# Patient Record
Sex: Female | Born: 1980
Health system: Southern US, Community
[De-identification: ages and names within clinical notes are randomized; demographics above are authoritative.]

## PROBLEM LIST (undated history)

## (undated) DIAGNOSIS — Z800 Family history of malignant neoplasm of digestive organs: Secondary | ICD-10-CM

## (undated) DIAGNOSIS — T7840XA Allergy, unspecified, initial encounter: Secondary | ICD-10-CM

## (undated) DIAGNOSIS — E785 Hyperlipidemia, unspecified: Secondary | ICD-10-CM

## (undated) DIAGNOSIS — K625 Hemorrhage of anus and rectum: Secondary | ICD-10-CM

## (undated) DIAGNOSIS — K219 Gastro-esophageal reflux disease without esophagitis: Secondary | ICD-10-CM

## (undated) DIAGNOSIS — Z8051 Family history of malignant neoplasm of kidney: Secondary | ICD-10-CM

## (undated) DIAGNOSIS — Z973 Presence of spectacles and contact lenses: Secondary | ICD-10-CM

## (undated) DIAGNOSIS — J189 Pneumonia, unspecified organism: Secondary | ICD-10-CM

## (undated) DIAGNOSIS — I10 Essential (primary) hypertension: Secondary | ICD-10-CM

## (undated) DIAGNOSIS — F410 Panic disorder [episodic paroxysmal anxiety] without agoraphobia: Secondary | ICD-10-CM

## (undated) DIAGNOSIS — E119 Type 2 diabetes mellitus without complications: Secondary | ICD-10-CM

## (undated) DIAGNOSIS — E669 Obesity, unspecified: Secondary | ICD-10-CM

## (undated) DIAGNOSIS — G43909 Migraine, unspecified, not intractable, without status migrainosus: Secondary | ICD-10-CM

## (undated) DIAGNOSIS — Z8489 Family history of other specified conditions: Secondary | ICD-10-CM

## (undated) DIAGNOSIS — G4730 Sleep apnea, unspecified: Secondary | ICD-10-CM

## (undated) DIAGNOSIS — Z803 Family history of malignant neoplasm of breast: Secondary | ICD-10-CM

## (undated) DIAGNOSIS — F32A Depression, unspecified: Secondary | ICD-10-CM

## (undated) DIAGNOSIS — Z8042 Family history of malignant neoplasm of prostate: Secondary | ICD-10-CM

## (undated) HISTORY — DX: Panic disorder (episodic paroxysmal anxiety): F41.0

## (undated) HISTORY — DX: Family history of malignant neoplasm of breast: Z80.3

## (undated) HISTORY — DX: Gastro-esophageal reflux disease without esophagitis: K21.9

## (undated) HISTORY — PX: OTHER SURGICAL HISTORY: SHX169

## (undated) HISTORY — DX: Family history of malignant neoplasm of kidney: Z80.51

## (undated) HISTORY — DX: Family history of malignant neoplasm of digestive organs: Z80.0

## (undated) HISTORY — DX: Presence of spectacles and contact lenses: Z97.3

## (undated) HISTORY — DX: Obesity, unspecified: E66.9

## (undated) HISTORY — PX: COLONOSCOPY: SHX174

## (undated) HISTORY — DX: Hyperlipidemia, unspecified: E78.5

## (undated) HISTORY — DX: Family history of malignant neoplasm of prostate: Z80.42

## (undated) HISTORY — DX: Depression, unspecified: F32.A

## (undated) HISTORY — DX: Type 2 diabetes mellitus without complications: E11.9

## (undated) HISTORY — DX: Migraine, unspecified, not intractable, without status migrainosus: G43.909

## (undated) HISTORY — DX: Hemorrhage of anus and rectum: K62.5

## (undated) HISTORY — DX: Allergy, unspecified, initial encounter: T78.40XA

## (undated) HISTORY — PX: FRACTURE SURGERY: SHX138

---

## 2003-07-15 HISTORY — PX: OTHER SURGICAL HISTORY: SHX169

## 2003-09-27 ENCOUNTER — Emergency Department (HOSPITAL_COMMUNITY): Admission: AD | Admit: 2003-09-27 | Discharge: 2003-09-28 | Payer: Self-pay | Admitting: Emergency Medicine

## 2004-05-27 ENCOUNTER — Emergency Department (HOSPITAL_COMMUNITY): Admission: EM | Admit: 2004-05-27 | Discharge: 2004-05-27 | Payer: Self-pay | Admitting: Emergency Medicine

## 2005-10-22 ENCOUNTER — Emergency Department (HOSPITAL_COMMUNITY): Admission: EM | Admit: 2005-10-22 | Discharge: 2005-10-23 | Payer: Self-pay | Admitting: Emergency Medicine

## 2006-03-07 ENCOUNTER — Emergency Department (HOSPITAL_COMMUNITY): Admission: EM | Admit: 2006-03-07 | Discharge: 2006-03-07 | Payer: Self-pay | Admitting: *Deleted

## 2007-03-08 ENCOUNTER — Emergency Department (HOSPITAL_COMMUNITY): Admission: EM | Admit: 2007-03-08 | Discharge: 2007-03-09 | Payer: Self-pay | Admitting: Emergency Medicine

## 2007-08-06 ENCOUNTER — Ambulatory Visit (HOSPITAL_COMMUNITY): Admission: RE | Admit: 2007-08-06 | Discharge: 2007-08-06 | Payer: Self-pay | Admitting: Chiropractic Medicine

## 2008-06-10 ENCOUNTER — Emergency Department (HOSPITAL_COMMUNITY): Admission: EM | Admit: 2008-06-10 | Discharge: 2008-06-10 | Payer: Self-pay | Admitting: Emergency Medicine

## 2011-01-06 ENCOUNTER — Other Ambulatory Visit: Payer: Self-pay | Admitting: Family Medicine

## 2011-01-06 ENCOUNTER — Other Ambulatory Visit (HOSPITAL_COMMUNITY)
Admission: RE | Admit: 2011-01-06 | Discharge: 2011-01-06 | Disposition: A | Discharge status: Home / Self Care | Payer: BC Managed Care – PPO | Source: Ambulatory Visit | Attending: Family Medicine | Admitting: Family Medicine

## 2011-01-06 DIAGNOSIS — Z113 Encounter for screening for infections with a predominantly sexual mode of transmission: Secondary | ICD-10-CM | POA: Insufficient documentation

## 2011-01-06 DIAGNOSIS — Z124 Encounter for screening for malignant neoplasm of cervix: Secondary | ICD-10-CM | POA: Insufficient documentation

## 2013-09-11 ENCOUNTER — Emergency Department (HOSPITAL_COMMUNITY)
Admission: EM | Admit: 2013-09-11 | Discharge: 2013-09-11 | Disposition: A | Discharge status: Home / Self Care | Payer: BC Managed Care – PPO | Attending: Emergency Medicine | Admitting: Emergency Medicine

## 2013-09-11 ENCOUNTER — Encounter (HOSPITAL_COMMUNITY): Payer: Self-pay | Admitting: Emergency Medicine

## 2013-09-11 DIAGNOSIS — R1110 Vomiting, unspecified: Secondary | ICD-10-CM

## 2013-09-11 DIAGNOSIS — Z79899 Other long term (current) drug therapy: Secondary | ICD-10-CM | POA: Insufficient documentation

## 2013-09-11 DIAGNOSIS — R51 Headache: Secondary | ICD-10-CM | POA: Insufficient documentation

## 2013-09-11 DIAGNOSIS — I10 Essential (primary) hypertension: Secondary | ICD-10-CM | POA: Insufficient documentation

## 2013-09-11 DIAGNOSIS — R112 Nausea with vomiting, unspecified: Secondary | ICD-10-CM | POA: Insufficient documentation

## 2013-09-11 DIAGNOSIS — R519 Headache, unspecified: Secondary | ICD-10-CM

## 2013-09-11 HISTORY — DX: Essential (primary) hypertension: I10

## 2013-09-11 MED ORDER — ONDANSETRON HCL 4 MG/2ML IJ SOLN
4.0000 mg | Freq: Once | INTRAMUSCULAR | Status: AC
  Administered 2013-09-11: 4 mg via INTRAVENOUS
  Filled 2013-09-11: qty 2

## 2013-09-11 MED ORDER — TRAMADOL HCL 50 MG PO TABS: 50.0000 mg | ORAL_TABLET | Freq: Four times a day (QID) | ORAL | Status: DC | PRN

## 2013-09-11 MED ORDER — SODIUM CHLORIDE 0.9 % IV BOLUS (SEPSIS)
1000.0000 mL | Freq: Once | INTRAVENOUS | Status: AC
  Administered 2013-09-11: 1000 mL via INTRAVENOUS

## 2013-09-11 MED ORDER — ONDANSETRON 4 MG PO TBDP: 4.0000 mg | ORAL_TABLET | Freq: Three times a day (TID) | ORAL | Status: DC | PRN

## 2013-09-11 MED ORDER — MORPHINE SULFATE 4 MG/ML IJ SOLN
4.0000 mg | INTRAMUSCULAR | Status: DC | PRN
  Administered 2013-09-11: 4 mg via INTRAVENOUS
  Filled 2013-09-11: qty 1

## 2013-09-11 NOTE — ED Notes (Signed)
Pt states she had a virus yesterday (n/v/d), vomited her BP meds and then today she had headache and checked her BP and was high.

## 2013-09-11 NOTE — Discharge Instructions (Signed)
Migraine Headache A migraine headache is an intense, throbbing pain on one or both sides of your head. A migraine can last for 30 minutes to several hours. CAUSES  The exact cause of a migraine headache is not always known. However, a migraine may be caused when nerves in the brain become irritated and release chemicals that cause inflammation. This causes pain. Certain things may also trigger migraines, such as:  Alcohol.  Smoking.  Stress.  Menstruation.  Aged cheeses.  Foods or drinks that contain nitrates, glutamate, aspartame, or tyramine.  Lack of sleep.  Chocolate.  Caffeine.  Hunger.  Physical exertion.  Fatigue.  Medicines used to treat chest pain (nitroglycerine), birth control pills, estrogen, and some blood pressure medicines. SIGNS AND SYMPTOMS  Pain on one or both sides of your head.  Pulsating or throbbing pain.  Severe pain that prevents daily activities.  Pain that is aggravated by any physical activity.  Nausea, vomiting, or both.  Dizziness.  Pain with exposure to bright lights, loud noises, or activity.  General sensitivity to bright lights, loud noises, or smells. Before you get a migraine, you may get warning signs that a migraine is coming (aura). An aura may include:  Seeing flashing lights.  Seeing bright spots, halos, or zig-zag lines.  Having tunnel vision or blurred vision.  Having feelings of numbness or tingling.  Having trouble talking.  Having muscle weakness. DIAGNOSIS  A migraine headache is often diagnosed based on:  Symptoms.  Physical exam.  A CT scan or MRI of your head. These imaging tests cannot diagnose migraines, but they can help rule out other causes of headaches. TREATMENT Medicines may be given for pain and nausea. Medicines can also be given to help prevent recurrent migraines.  HOME CARE INSTRUCTIONS  Only take over-the-counter or prescription medicines for pain or discomfort as directed by your  health care provider. The use of long-term narcotics is not recommended.  Lie down in a dark, quiet room when you have a migraine.  Keep a journal to find out what may trigger your migraine headaches. For example, write down:  What you eat and drink.  How much sleep you get.  Any change to your diet or medicines.  Limit alcohol consumption.  Quit smoking if you smoke.  Get 7 9 hours of sleep, or as recommended by your health care provider.  Limit stress.  Keep lights dim if bright lights bother you and make your migraines worse. SEEK IMMEDIATE MEDICAL CARE IF:   Your migraine becomes severe.  You have a fever.  You have a stiff neck.  You have vision loss.  You have muscular weakness or loss of muscle control.  You start losing your balance or have trouble walking.  You feel faint or pass out.  You have severe symptoms that are different from your first symptoms. MAKE SURE YOU:   Understand these instructions.  Will watch your condition.  Will get help right away if you are not doing well or get worse. Document Released: 06/30/2005 Document Revised: 04/20/2013 Document Reviewed: 03/07/2013 [facility] Patient Information 2014 [place].  Nausea and Vomiting Nausea means you feel sick to your stomach. Throwing up (vomiting) is a reflex where stomach contents come out of your mouth. HOME CARE   Take medicine as told by your doctor.  Do not force yourself to eat. However, you do need to drink fluids.  If you feel like eating, eat a normal diet as told by your doctor.  Eat  rice, wheat, potatoes, bread, lean meats, yogurt, fruits, and vegetables.  Avoid high-fat foods.  Drink enough fluids to keep your pee (urine) clear or pale yellow.  Ask your doctor how to replace body fluid losses (rehydrate). Signs of body fluid loss (dehydration) include:  Feeling very thirsty.  Dry lips and mouth.  Feeling dizzy.  Dark pee.  Peeing less than  normal.  Feeling confused.  Fast breathing or heart rate. GET HELP RIGHT AWAY IF:   You have blood in your throw up.  You have black or bloody poop (stool).  You have a bad headache or stiff neck.  You feel confused.  You have bad belly (abdominal) pain.  You have chest pain or trouble breathing.  You do not pee at least once every 8 hours.  You have cold, clammy skin.  You keep throwing up after 24 to 48 hours.  You have a fever. MAKE SURE YOU:   Understand these instructions.  Will watch your condition.  Will get help right away if you are not doing well or get worse. Document Released: 12/17/2007 Document Revised: 09/22/2011 Document Reviewed: 11/29/2010 [facility] [place] Patient Information 2014 [place], [place].

## 2013-09-11 NOTE — ED Provider Notes (Signed)
CSN: [id number]     Arrival date & time 09/11/13  1742 History   First MD Initiated Contact with Patient 09/11/13 1940     Chief Complaint  Patient presents with  . Hypertension  . Headache     HPI  Patient's history migraine headaches. To migraine yesterday. Nausea and vomiting today. Was unable to keep down her blood pressure medication. Became concerned because her blood pressure was one sixty over one 3. No vomiting status of nausea. Celexa 6/10 headache. No neck stiffness. No fever. No thunderclap event no neurological symptoms. No confusion no rash.  Past Medical History  Diagnosis Date  . Hypertension    Past Surgical History  Procedure Laterality Date  . Fracture surgery     History reviewed. No pertinent family history. History  Substance Use Topics  . Smoking status: Never Smoker   . Smokeless tobacco: Never Used  . Alcohol Use: No   OB History   Grav Para Term Preterm Abortions TAB SAB Ect Mult Living                 Review of Systems  Constitutional: Negative for fever, chills, diaphoresis, appetite change and fatigue.  HENT: Negative for mouth sores, sore throat and trouble swallowing.   Eyes: Negative for visual disturbance.  Respiratory: Negative for cough, chest tightness, shortness of breath and wheezing.   Cardiovascular: Negative for chest pain.  Gastrointestinal: Positive for nausea and vomiting. Negative for abdominal pain, diarrhea and abdominal distention.  Endocrine: Negative for polydipsia, polyphagia and polyuria.  Genitourinary: Negative for dysuria, frequency and hematuria.  Musculoskeletal: Negative for gait problem.  Skin: Negative for color change, pallor and rash.  Neurological: Positive for headaches. Negative for dizziness, syncope and light-headedness.  Hematological: Does not bruise/bleed easily.  Psychiatric/Behavioral: Negative for behavioral problems and confusion.      Allergies  Review of patient's allergies indicates no  known allergies.  Home Medications   Current Outpatient Rx  Name  Route  Sig  Dispense  Refill  . lisinopril-hydrochlorothiazide (PRINZIDE,ZESTORETIC) 10-12.5 MG per tablet   Oral   Take 1 tablet by mouth daily.         Marland Kitchen loratadine (CLARITIN) 10 MG tablet   Oral   Take 10 mg by mouth daily.         . Multiple Vitamin (MULTIVITAMIN WITH MINERALS) TABS tablet   Oral   Take 1 tablet by mouth daily.         Marland Kitchen omeprazole (PRILOSEC) 20 MG capsule   Oral   Take 20 mg by mouth daily.         . ondansetron (ZOFRAN ODT) 4 MG disintegrating tablet   Oral   Take 1 tablet (4 mg total) by mouth every 8 (eight) hours as needed for nausea.   10 tablet   0   . traMADol (ULTRAM) 50 MG tablet   Oral   Take 1 tablet (50 mg total) by mouth every 6 (six) hours as needed.   15 tablet   0    BP 154/89  Pulse 69  Temp(Src) 98 F (36.7 C) (Oral)  Resp 18  SpO2 99%  LMP 09/10/2013 Physical Exam  Constitutional: She is oriented to person, place, and time. She appears well-developed and well-nourished. No distress.  HENT:  Head: Normocephalic.  Eyes: Conjunctivae are normal. Pupils are equal, round, and reactive to light. No scleral icterus.  Neck: Normal range of motion. Neck supple. No thyromegaly present.  Cardiovascular: Normal rate and  regular rhythm.  Exam reveals no gallop and no friction rub.   No murmur heard. Pulmonary/Chest: Effort normal and breath sounds normal. No respiratory distress. She has no wheezes. She has no rales.  Abdominal: Soft. Bowel sounds are normal. She exhibits no distension. There is no tenderness. There is no rebound.  Musculoskeletal: Normal range of motion.  Neurological: She is alert and oriented to person, place, and time.  Skin: Skin is warm and dry. No rash noted.  Psychiatric: She has a normal mood and affect. Her behavior is normal.    ED Course  Procedures (including critical care time) Labs Review Labs Reviewed - No data to  display Imaging Review No results found.   EKG Interpretation None      MDM   Final diagnoses:  Vomiting  Headache    Headache resolved. Taking by mouth liquids. Plan is discharge home.    Tanna Furry, MD 09/11/13 2242

## 2013-11-09 ENCOUNTER — Encounter (HOSPITAL_COMMUNITY): Payer: Self-pay | Admitting: Emergency Medicine

## 2013-11-09 ENCOUNTER — Emergency Department (HOSPITAL_COMMUNITY)
Admission: EM | Admit: 2013-11-09 | Discharge: 2013-11-09 | Disposition: A | Discharge status: Home / Self Care | Payer: BC Managed Care – PPO | Attending: Emergency Medicine | Admitting: Emergency Medicine

## 2013-11-09 DIAGNOSIS — J3489 Other specified disorders of nose and nasal sinuses: Secondary | ICD-10-CM | POA: Insufficient documentation

## 2013-11-09 DIAGNOSIS — R059 Cough, unspecified: Secondary | ICD-10-CM | POA: Insufficient documentation

## 2013-11-09 DIAGNOSIS — R0602 Shortness of breath: Secondary | ICD-10-CM | POA: Insufficient documentation

## 2013-11-09 DIAGNOSIS — Z9114 Patient's other noncompliance with medication regimen: Secondary | ICD-10-CM

## 2013-11-09 DIAGNOSIS — R0981 Nasal congestion: Secondary | ICD-10-CM

## 2013-11-09 DIAGNOSIS — K1370 Unspecified lesions of oral mucosa: Secondary | ICD-10-CM | POA: Insufficient documentation

## 2013-11-09 DIAGNOSIS — R0982 Postnasal drip: Secondary | ICD-10-CM | POA: Insufficient documentation

## 2013-11-09 DIAGNOSIS — Z79899 Other long term (current) drug therapy: Secondary | ICD-10-CM | POA: Insufficient documentation

## 2013-11-09 DIAGNOSIS — R05 Cough: Secondary | ICD-10-CM | POA: Insufficient documentation

## 2013-11-09 DIAGNOSIS — Z91199 Patient's noncompliance with other medical treatment and regimen due to unspecified reason: Secondary | ICD-10-CM | POA: Insufficient documentation

## 2013-11-09 DIAGNOSIS — Z9119 Patient's noncompliance with other medical treatment and regimen: Secondary | ICD-10-CM | POA: Insufficient documentation

## 2013-11-09 DIAGNOSIS — I10 Essential (primary) hypertension: Secondary | ICD-10-CM | POA: Insufficient documentation

## 2013-11-09 DIAGNOSIS — R000 Tachycardia, unspecified: Secondary | ICD-10-CM | POA: Insufficient documentation

## 2013-11-09 MED ORDER — DEXTROMETHORPHAN-GUAIFENESIN 10-200 MG PO CAPS: 1.0000 | ORAL_CAPSULE | Freq: Four times a day (QID) | ORAL | Status: DC

## 2013-11-09 MED ORDER — SALINE SPRAY 0.65 % NA SOLN
1.0000 | Freq: Once | NASAL | Status: AC
Start: 2013-11-09 — End: 2013-11-09
  Administered 2013-11-09: 1 via NASAL
  Filled 2013-11-09: qty 44

## 2013-11-09 NOTE — Discharge Instructions (Signed)
Medication is safe to take with your blood pressure medicine.  We try to remember to take your blood pressure medicine on a regular basis.  It is very, very, very important.  You've also been given a bottle of spray, nasal spray.  Please uses as needed.  For congestion.  Try to increase the amount of fluids that you drink this will help liquefy.  Your nasal drainage

## 2013-11-09 NOTE — ED Provider Notes (Signed)
Medical screening examination/treatment/procedure(s) were performed by non-physician practitioner and as supervising physician I was immediately available for consultation/collaboration.   EKG Interpretation None        Julianne Rice, MD 11/09/13 4270

## 2013-11-09 NOTE — ED Notes (Signed)
Pt reports having runny eyes, itchy throat, rash and cough that started Friday and have gotten worse with SOB and nasal congestion. Pt without respiratory distress and speaking in full sentences. Pt states she takes allegra everyday with no relief of symptoms. Pt alert and ambulatory to triage area.

## 2013-11-09 NOTE — ED Provider Notes (Signed)
CSN: [id number]     Arrival date & time 11/09/13  0040 History   First MD Initiated Contact with Patient 11/09/13 0134     Chief Complaint  Patient presents with  . Nasal Congestion  . Cough     (Consider location/radiation/quality/duration/timing/severity/associated sxs/prior Treatment) HPI Comments: Emily Phelps is a morbidly obese, African American female, with a history of high blood pressure, and medication noncompliance, stating she forgets" presents tonight with URI, symptoms on top of her seasonal allergy.  She, states, that when she lies back.  She has a postnasal, drip.  That's causing her to cough.  The cough has been irritating for the past several, days  Patient is a 33 y.o. female presenting with cough. The history is provided by the patient.  Cough Cough characteristics:  Non-productive Severity:  Mild Onset quality:  Gradual Timing:  Intermittent Progression:  Unchanged Chronicity:  New Smoker: no   Context: upper respiratory infection   Relieved by:  Nothing Worsened by:  Lying down Ineffective treatments:  None tried Associated symptoms: rhinorrhea and shortness of breath   Associated symptoms: no fever and no wheezing     Past Medical History  Diagnosis Date  . Hypertension    Past Surgical History  Procedure Laterality Date  . Fracture surgery     History reviewed. No pertinent family history. History  Substance Use Topics  . Smoking status: Never Smoker   . Smokeless tobacco: Never Used  . Alcohol Use: No   OB History   Grav Para Term Preterm Abortions TAB SAB Ect Mult Living                 Review of Systems  Constitutional: Negative for fever.  HENT: Positive for congestion, postnasal drip and rhinorrhea. Negative for facial swelling and trouble swallowing.   Respiratory: Positive for cough and shortness of breath. Negative for wheezing.   All other systems reviewed and are negative.     Allergies  Review of patient's allergies  indicates no known allergies.  Home Medications   Prior to Admission medications   Medication Sig Start Date End Date Taking? Authorizing Provider  calcium carbonate (TUMS - DOSED IN MG ELEMENTAL CALCIUM) 500 MG chewable tablet Chew 1 tablet by mouth 3 (three) times daily as needed for indigestion or heartburn.   Yes Historical Provider, MD  fexofenadine (ALLEGRA) 180 MG tablet Take 180 mg by mouth daily.   Yes Historical Provider, MD  guaiFENesin (ROBITUSSIN) 100 MG/5ML liquid Take 200 mg by mouth 3 (three) times daily as needed for cough.   Yes Historical Provider, MD  lisinopril-hydrochlorothiazide (PRINZIDE,ZESTORETIC) 10-12.5 MG per tablet Take 1 tablet by mouth daily.   Yes Historical Provider, MD  Multiple Vitamin (MULTIVITAMIN WITH MINERALS) TABS tablet Take 1 tablet by mouth daily.   Yes Historical Provider, MD  omeprazole (PRILOSEC) 20 MG capsule Take 20 mg by mouth daily.   Yes Historical Provider, MD  ondansetron (ZOFRAN ODT) 4 MG disintegrating tablet Take 1 tablet (4 mg total) by mouth every 8 (eight) hours as needed for nausea. 09/11/13  Yes Tanna Furry, MD  pseudoephedrine-acetaminophen (TYLENOL SINUS) 30-500 MG TABS Take 1 tablet by mouth every 4 (four) hours as needed (sinus pain).   Yes Historical Provider, MD  traMADol (ULTRAM) 50 MG tablet Take 1 tablet (50 mg total) by mouth every 6 (six) hours as needed. 09/11/13  Yes Tanna Furry, MD  Dextromethorphan-Guaifenesin (CORICIDIN HBP CONGESTION/COUGH) 10-200 MG CAPS Take 1 tablet by mouth QID. 11/09/13  Garald Balding, NP   BP 217/94  Pulse 113  Temp(Src) 98.1 F (36.7 C) (Oral)  Resp 22  Ht 5\' 4"  (1.626 m)  Wt 319 lb (144.697 kg)  BMI 54.73 kg/m2  SpO2 100%  LMP 10/09/2013 Physical Exam  Constitutional: She is oriented to person, place, and time. She appears well-developed and well-nourished.  Morbidly obese  HENT:  Head: Normocephalic.  Right Ear: External ear normal.  Left Ear: External ear normal.  Mouth/Throat:  Oropharynx is clear and moist. Uvula swelling present.  Eyes: Pupils are equal, round, and reactive to light.  Neck: Normal range of motion.  Cardiovascular: Regular rhythm.  Tachycardia present.   Pulmonary/Chest: Effort normal and breath sounds normal. No respiratory distress. She has no wheezes. She has no rales. She exhibits no tenderness.  Musculoskeletal: Normal range of motion.  Lymphadenopathy:    She has no cervical adenopathy.  Neurological: She is alert and oriented to person, place, and time.  Skin: Skin is warm. No rash noted.    ED Course  Procedures (including critical care time) Labs Review Labs Reviewed - No data to display  Imaging Review No results found.   EKG Interpretation None      MDM  I discussed the importance of her taking her blood pressure medicine on a regular basis.  It should be secondary to her and recommend that she placed it.  Next to her toothbrush as a reminder.  She's been given saline nasal spray, and a prescription for Coricidin HBP for her symptoms.  She's been encouraged to drink more fluid Final diagnoses:  Nasal congestion  H/O medication noncompliance  Hypertension        Garald Balding, NP 11/09/13 0153  Garald Balding, NP 11/09/13 0093

## 2013-12-16 ENCOUNTER — Ambulatory Visit (INDEPENDENT_AMBULATORY_CARE_PROVIDER_SITE_OTHER): Payer: BC Managed Care – PPO | Admitting: Medical

## 2013-12-16 ENCOUNTER — Encounter: Payer: Self-pay | Admitting: Medical

## 2013-12-16 VITALS — BP 164/110 | HR 68 | Temp 98.0°F | Resp 18 | Ht 64.0 in | Wt 332.0 lb

## 2013-12-16 DIAGNOSIS — N926 Irregular menstruation, unspecified: Secondary | ICD-10-CM

## 2013-12-16 DIAGNOSIS — I10 Essential (primary) hypertension: Secondary | ICD-10-CM

## 2013-12-16 DIAGNOSIS — R635 Abnormal weight gain: Secondary | ICD-10-CM

## 2013-12-16 DIAGNOSIS — G43909 Migraine, unspecified, not intractable, without status migrainosus: Secondary | ICD-10-CM

## 2013-12-16 DIAGNOSIS — E669 Obesity, unspecified: Secondary | ICD-10-CM

## 2013-12-16 DIAGNOSIS — R5383 Other fatigue: Secondary | ICD-10-CM

## 2013-12-16 DIAGNOSIS — R5381 Other malaise: Secondary | ICD-10-CM

## 2013-12-16 DIAGNOSIS — K219 Gastro-esophageal reflux disease without esophagitis: Secondary | ICD-10-CM

## 2013-12-16 LAB — CBC WITH DIFFERENTIAL/PLATELET
Basophils Absolute: 0 10*3/uL (ref 0.0–0.1)
Basophils Relative: 0 % (ref 0–1)
Eosinophils Absolute: 0.3 10*3/uL (ref 0.0–0.7)
Eosinophils Relative: 3 % (ref 0–5)
HCT: 34.9 % — ABNORMAL LOW (ref 36.0–46.0)
Hemoglobin: 12.1 g/dL (ref 12.0–15.0)
Lymphocytes Relative: 39 % (ref 12–46)
Lymphs Abs: 3.6 10*3/uL (ref 0.7–4.0)
MCH: 24.9 pg — ABNORMAL LOW (ref 26.0–34.0)
MCHC: 34.7 g/dL (ref 30.0–36.0)
MCV: 71.8 fL — ABNORMAL LOW (ref 78.0–100.0)
Monocytes Absolute: 0.6 10*3/uL (ref 0.1–1.0)
Monocytes Relative: 6 % (ref 3–12)
Neutro Abs: 4.8 10*3/uL (ref 1.7–7.7)
Neutrophils Relative %: 52 % (ref 43–77)
Platelets: 447 10*3/uL — ABNORMAL HIGH (ref 150–400)
RBC: 4.86 MIL/uL (ref 3.87–5.11)
RDW: 17.6 % — ABNORMAL HIGH (ref 11.5–15.5)
WBC: 9.3 10*3/uL (ref 4.0–10.5)

## 2013-12-16 LAB — COMPREHENSIVE METABOLIC PANEL
ALT: 17 U/L (ref 0–35)
AST: 17 U/L (ref 0–37)
Albumin: 4.1 g/dL (ref 3.5–5.2)
Alkaline Phosphatase: 70 U/L (ref 39–117)
BUN: 7 mg/dL (ref 6–23)
CO2: 29 mEq/L (ref 19–32)
Calcium: 9.4 mg/dL (ref 8.4–10.5)
Chloride: 102 mEq/L (ref 96–112)
Creat: 0.67 mg/dL (ref 0.50–1.10)
Glucose, Bld: 85 mg/dL (ref 70–99)
Potassium: 4.1 mEq/L (ref 3.5–5.3)
Sodium: 140 mEq/L (ref 135–145)
Total Bilirubin: 0.5 mg/dL (ref 0.2–1.2)
Total Protein: 7.3 g/dL (ref 6.0–8.3)

## 2013-12-16 LAB — LIPID PANEL
Cholesterol: 179 mg/dL (ref 0–200)
HDL: 40 mg/dL (ref >39)
LDL Cholesterol: 111 mg/dL — ABNORMAL HIGH (ref 0–99)
Total CHOL/HDL Ratio: 4.5 Ratio
Triglycerides: 141 mg/dL (ref <150)
VLDL: 28 mg/dL (ref 0–40)

## 2013-12-16 LAB — POCT URINE PREGNANCY: Preg Test, Ur: NEGATIVE

## 2013-12-16 MED ORDER — LOSARTAN POTASSIUM-HCTZ 50-12.5 MG PO TABS: 1.0000 | ORAL_TABLET | Freq: Every day | ORAL | Status: DC

## 2013-12-16 MED ORDER — SIMVASTATIN 10 MG PO TABS: 10.0000 mg | ORAL_TABLET | Freq: Every day | ORAL | Status: DC

## 2013-12-16 MED ORDER — FEXOFENADINE HCL 180 MG PO TABS: 180.0000 mg | ORAL_TABLET | Freq: Every day | ORAL | Status: DC

## 2013-12-16 MED ORDER — OMEPRAZOLE 20 MG PO CPDR: 20.0000 mg | DELAYED_RELEASE_CAPSULE | Freq: Every day | ORAL | Status: DC

## 2013-12-16 MED ORDER — TOPIRAMATE 50 MG PO TABS: 50.0000 mg | ORAL_TABLET | Freq: Two times a day (BID) | ORAL | Status: DC

## 2013-12-16 NOTE — Progress Notes (Signed)
Subjective:   Emily Phelps is a 33 y.o. female presenting on 12/16/2013 with thyroid and allergies and irregular menses  Here as a new patient.  Moved from [place] to here in [place] months ago.    Needs med refills.  She notes that she has had lots of weight gain the last 7 mo without much diet changes.  Doesn't think she is over eating.  Wants to be checked for diabetes and thyroid issues.   Lately having some back pain, worse with movement. No urine problems, no numbness, tingling, weakness, no recent injury, trauma, or fall.  Didn't have menstrual cycle in April, but usually normally.  LMP 11/15/13, but very light.  Sexually active, no OCPs.  No recent pregnancy test.    No other aggravating or relieving factors.  No other complaint.  Review of Systems ROS as in subjective      Objective:   Filed Vitals:   12/16/13 1051  BP: 164/110  Pulse: 68  Temp: 98 F (36.7 C)  Resp: 18    General appearance: alert, no distress, WD/WN, obese AA female Oral cavity: MMM, no lesions Neck: supple, no lymphadenopathy, no thyromegaly, no masses Heart: RRR, normal S1, S2, no murmurs Lungs: CTA bilaterally, no wheezes, rhonchi, or rales Abdomen: +bs, soft, non tender, non distended, no masses, no hepatomegaly, no splenomegaly Pulses: 2+ symmetric, upper and lower extremities, normal cap refill Ext: no edema      Assessment: Encounter Diagnoses  Name Primary?  . Essential hypertension, benign Yes  . Weight gain   . Obesity, unspecified   . Missed period   . Migraine   . GERD (gastroesophageal reflux disease)   . Fatigue      Plan: Hypertension-been out of medications. She is also convinced that lisinopril is causing skin bumps and cough.  Thus, changed to losartan HCT. Labs today  Weight gain-likely due to diet and lack of exercise, possibly related to the fact she's been out of her Topamax.  Restart Topamax, work on diet and weight loss  Obesity-same as weight  gain  Missed period - urine pregnancy negative today  Migraine-refill Topamax 50 mg twice a day  GERD-refilled PPI  Fatigue-labs today   Emily Phelps was seen today for thyroid and allergies and irregular menses.  Diagnoses and associated orders for this visit:  Essential hypertension, benign - Comprehensive metabolic panel - Lipid panel - CBC with Differential - TSH - Hemoglobin A1c  Weight gain - Comprehensive metabolic panel - Lipid panel - CBC with Differential - TSH - Hemoglobin A1c  Obesity, unspecified - Comprehensive metabolic panel - Lipid panel - CBC with Differential - TSH - Hemoglobin A1c  Missed period - Comprehensive metabolic panel - Lipid panel - CBC with Differential - TSH - Hemoglobin A1c - POCT urine pregnancy  Migraine - Comprehensive metabolic panel - Lipid panel - CBC with Differential - TSH - Hemoglobin A1c  GERD (gastroesophageal reflux disease) - Comprehensive metabolic panel - Lipid panel - CBC with Differential - TSH - Hemoglobin A1c  Fatigue - Comprehensive metabolic panel - Lipid panel - CBC with Differential - TSH - Hemoglobin A1c  Other Orders - topiramate (TOPAMAX) 50 MG tablet; Take 1 tablet (50 mg total) by mouth 2 (two) times daily. - omeprazole (PRILOSEC) 20 MG capsule; Take 1 capsule (20 mg total) by mouth daily. - simvastatin (ZOCOR) 10 MG tablet; Take 1 tablet (10 mg total) by mouth daily. - fexofenadine (ALLEGRA) 180 MG tablet; Take 1 tablet (180 mg total)  by mouth daily. - losartan-hydrochlorothiazide (HYZAAR) 50-12.5 MG per tablet; Take 1 tablet by mouth daily.     Return pending labs.

## 2013-12-16 NOTE — Addendum Note (Signed)
Addended by: Louie Bun on: 12/16/2013 12:15 PM   Modules accepted: Orders

## 2013-12-16 NOTE — Addendum Note (Signed)
Addended by: Louie Bun on: 12/16/2013 12:13 PM   Modules accepted: Orders

## 2013-12-17 LAB — HEMOGLOBIN A1C
Hgb A1c MFr Bld: 5.1 % (ref <5.7)
Mean Plasma Glucose: 100 mg/dL (ref <117)

## 2013-12-17 LAB — TSH: TSH: 3.404 u[IU]/mL (ref 0.350–4.500)

## 2014-01-05 ENCOUNTER — Telehealth: Payer: Self-pay | Admitting: Medical

## 2014-01-05 ENCOUNTER — Encounter: Payer: Self-pay | Admitting: Medical

## 2014-01-05 ENCOUNTER — Ambulatory Visit (INDEPENDENT_AMBULATORY_CARE_PROVIDER_SITE_OTHER): Payer: BC Managed Care – PPO | Admitting: Medical

## 2014-01-05 VITALS — BP 120/78 | HR 88 | Wt 333.0 lb

## 2014-01-05 DIAGNOSIS — N949 Unspecified condition associated with female genital organs and menstrual cycle: Secondary | ICD-10-CM

## 2014-01-05 DIAGNOSIS — N938 Other specified abnormal uterine and vaginal bleeding: Secondary | ICD-10-CM

## 2014-01-05 DIAGNOSIS — N925 Other specified irregular menstruation: Secondary | ICD-10-CM

## 2014-01-05 DIAGNOSIS — I10 Essential (primary) hypertension: Secondary | ICD-10-CM

## 2014-01-05 MED ORDER — MEDROXYPROGESTERONE ACETATE 10 MG PO TABS: 10.0000 mg | ORAL_TABLET | Freq: Every day | ORAL | Status: DC

## 2014-01-05 NOTE — Telephone Encounter (Signed)
Refer to gynecology - for dysfunctional uterine bleeding, screening pap, routine care.  She prefers female provider, mentioned [facility].

## 2014-01-05 NOTE — Patient Instructions (Addendum)
  Thank you for giving me the opportunity to serve you today.    Your diagnosis today includes: Encounter Diagnoses  Name Primary?  . Dysfunctional uterine bleeding Yes  . Essential hypertension, benign      Specific recommendations today include:  Begin Provera 10mg  daily for 5 days to help restart the period cycle  Begin OTC multivitamin with iron or prenatal vitamin with iron  We will refer you to gynecology  Glad to see your blood pressure is at goal     I have included other useful information below for your review.  Abnormal Uterine Bleeding Abnormal uterine bleeding means bleeding from the vagina that is not your normal menstrual period. This can be:  Bleeding or spotting between periods.  Bleeding after sex (sexual intercourse).  Bleeding that is heavier or more than normal.  Periods that last longer than usual.  Bleeding after menopause. There are many problems that may cause this. Treatment will depend on the cause of the bleeding. Any kind of bleeding that is not normal should be reviewed by your doctor.  HOME CARE Watch your condition for any changes. These actions may lessen any discomfort you are having:  Do not use tampons or douches as told by your doctor.  Change your pads often. You should get regular pelvic exams and Pap tests. Keep all appointments for tests as told by your doctor. GET HELP IF:  You are bleeding for more than 1 week.  You feel dizzy at times. GET HELP RIGHT AWAY IF:   You pass out.  You have to change pads every 15 to 30 minutes.  You have belly pain.  You have a fever.  You become sweaty or weak.  You are passing large blood clots from the vagina.  You feel sick to your stomach (nauseous) and throw up (vomit). MAKE SURE YOU:  Understand these instructions.  Will watch your condition.  Will get help right away if you are not doing well or get worse. Document Released: 04/27/2009 Document Revised: 07/05/2013  Document Reviewed: 01/27/2013 [facility] Patient Information 2015 [place], [place]. This information is not intended to replace advice given to you by your health care provider. Make sure you discuss any questions you have with your health care provider.

## 2014-01-05 NOTE — Progress Notes (Signed)
   Subjective:   Emily Phelps is a 33 y.o. female presenting on 01/05/2014 with irregular periods  January through March periods were normal.  Cycle didn't come on in April early May started spotting, had light period.   Then 12/16/13 period started with spotting, and c/t to spot some, then got heavier, and hasn't stopped since.  Usually has heavy periods and lots of clots.   Not on OCPs, no hx/o fibroids.  No hx/o ovarian cysts.  In her teens was on OCPs for heavy periods, light headed and passing out due to anemia.   Was on OCPs til early 20s.  Menarche began 33yo.  No prior pregnancies.  Had ultrasound of pelvis as teenager for PID.   In a relationship currently x 8 months.  Last STD testing 04/2013, normal.  Done at health dept [place], [place].  Last pap not sure.   Recently started back no Losartan HCT and tompamax but was having this problem prior to the new medications.   Mother had hx/o fibroids and hysterectomy in her 37s.   No other aggravating or relieving factors.  No other complaint.  Review of Systems ROS as in subjective      Objective:    BP 120/78  Pulse 88  Wt 333 lb (151.048 kg)  LMP 01/05/2014  General appearance: alert, no distress, WD/WN, obese AA female Abdomen: +bs, soft, non tender, non distended, no masses, no hepatomegaly, no splenomegaly Pulses: 2+ symmetric, upper and lower extremities, normal cap refill Ext: no edema Gyn: large body habitus, normal external genitalia without lesions, vagina with normal mucosa, too much blood in vault to see much.  Uterus and adnexa not enlarged, nontender, no masses.   Exam chaperoned by nurse. Rectal: deferred       Assessment: Encounter Diagnoses  Name Primary?  . Dysfunctional uterine bleeding Yes  . Essential hypertension, benign      Plan: Begin Provera 10mg  daily for 5 days.   Will go ahead and refer to gyn for routine screening and preventative care, pap, and dysfunction bleeding.   HTN at goal since  starting back on medication  Rajanae was seen today for irregular periods.  Diagnoses and associated orders for this visit:  Dysfunctional uterine bleeding - Ambulatory referral to Gynecology  Essential hypertension, benign  Other Orders - medroxyPROGESTERone (PROVERA) 10 MG tablet; Take 1 tablet (10 mg total) by mouth daily.   Return pending referral.

## 2014-01-09 ENCOUNTER — Other Ambulatory Visit: Payer: Self-pay | Admitting: Family Medicine

## 2014-01-10 ENCOUNTER — Other Ambulatory Visit: Payer: Self-pay | Admitting: Family Medicine

## 2014-01-10 DIAGNOSIS — N925 Other specified irregular menstruation: Secondary | ICD-10-CM

## 2014-01-10 DIAGNOSIS — N938 Other specified abnormal uterine and vaginal bleeding: Secondary | ICD-10-CM

## 2014-01-10 DIAGNOSIS — N949 Unspecified condition associated with female genital organs and menstrual cycle: Secondary | ICD-10-CM

## 2014-01-10 NOTE — Telephone Encounter (Signed)
I fax over her referral to [facility] and they will contact her for her appointment. CLS

## 2014-01-25 ENCOUNTER — Telehealth: Payer: Self-pay | Admitting: Medical

## 2014-01-25 ENCOUNTER — Other Ambulatory Visit: Payer: Self-pay | Admitting: Medical

## 2014-01-25 MED ORDER — MEDROXYPROGESTERONE ACETATE 10 MG PO TABS: 10.0000 mg | ORAL_TABLET | Freq: Every day | ORAL | Status: DC

## 2014-01-25 NOTE — Telephone Encounter (Signed)
The only other thing I would initiate at this time other than getting her into gyn is ultrasound . If she wants to pursue this, then schedule pelvic US and have her begin Iron OTC.  She may ultimately need to go back on hormonal contraception to control the bleeding, particular if taking the type of BP medication she is on.

## 2014-01-25 NOTE — Telephone Encounter (Signed)
We can try it for 10 days this time.  i'll send it. Begin OTC iron as well.

## 2014-01-25 NOTE — Telephone Encounter (Signed)
Pt states she took the Provera for 5days and the bleeding stopped for four days and when it started back it was heavier before. She is still bleeding heavy as of now.

## 2014-01-25 NOTE — Telephone Encounter (Signed)
Sent to [facility]  

## 2014-01-25 NOTE — Telephone Encounter (Signed)
How does she do on the Provera after her last visit?  Did the bleeding stop after a few days, has she been fine up until now or has she been bleeding continuously?

## 2014-01-25 NOTE — Telephone Encounter (Signed)
Pt states her GYN will be doing the [place] and she really wants to stop bleeding and she is tired. Pt states that you had her on Progesterone before.

## 2014-01-25 NOTE — Telephone Encounter (Signed)
Pt says menstral cycle has started back heavy again. She is having to get up in the middle of the night to change her clothes due to the heavy cycle. Cycle is getting heavier and heavier over the past month and pt has been bleeding constantly for a month. OBGYN appointment is not until 8/10. Can Emily Phelps help to give her until the appointment? Pt says she can not continue to bleed and increase in bleeding at this rate until the 8/10 appt

## 2014-01-25 NOTE — Telephone Encounter (Signed)
Pt.notified

## 2014-02-20 ENCOUNTER — Ambulatory Visit: Payer: BC Managed Care – PPO | Admitting: Obstetrics and Gynecology

## 2014-02-20 ENCOUNTER — Telehealth: Payer: Self-pay | Admitting: Obstetrics and Gynecology

## 2014-02-20 NOTE — Telephone Encounter (Signed)
Routing to Dr. Quincy Simmonds for [facility].

## 2014-02-20 NOTE — Telephone Encounter (Signed)
Patient called at 9:30am to cancel her [facility] appointment for today @ 1:30pm she states she no longer has health insurance.

## 2014-02-20 NOTE — Telephone Encounter (Signed)
Please inform the referring physician so they know she did not complete the appointment.

## 2014-02-21 NOTE — Telephone Encounter (Signed)
Emily Hammock, do you have the referring provider information? Is it [facility]? Do you have a form that you send?

## 2014-02-21 NOTE — Telephone Encounter (Signed)
This has already been completed. Encounter closed (duplicate correspondence) Patient released from practice for not keeping her new patient doctor referral appointment. Notes are in the referral and the provider responded back:  ----- Message -----    From: Carlena Hurl, PA-C    Sent: 02/20/2014   1:10 PM      To: Leeroy Bock Subject: RE: Please reroute referral to another offic*  Hello  I understand.  We get frustrated with this type of thing too.    Thanks Dorothea Ogle, PA-C  ----- Message -----    From: Leeroy Bock    Sent: 02/20/2014  12:18 PM      To: Brook E Amundson de Berton Lan, MD, * Subject: Please reroute referral to another office.     Please reroute this referral to another office. This patient did not keep her appointment with Dr. Quincy Simmonds today at our office.  Thank you, Lavella Hammock

## 2014-04-24 ENCOUNTER — Encounter: Payer: Self-pay | Admitting: Medical

## 2014-04-24 ENCOUNTER — Ambulatory Visit (INDEPENDENT_AMBULATORY_CARE_PROVIDER_SITE_OTHER): Payer: Self-pay | Admitting: Medical

## 2014-04-24 VITALS — BP 142/90 | HR 88 | Temp 98.5°F | Resp 16 | Ht 66.0 in | Wt 350.0 lb

## 2014-04-24 DIAGNOSIS — E669 Obesity, unspecified: Secondary | ICD-10-CM

## 2014-04-24 DIAGNOSIS — I10 Essential (primary) hypertension: Secondary | ICD-10-CM

## 2014-04-24 MED ORDER — LOSARTAN POTASSIUM-HCTZ 100-12.5 MG PO TABS: 1.0000 | ORAL_TABLET | Freq: Every day | ORAL | Status: DC

## 2014-04-24 NOTE — Progress Notes (Signed)
Subjective: Here for concern about BP.   Last visit here in June at that time BP was at goal.  Martin Majestic to dentist recently, needs to have teeth pulled.  They wouldn't do the dental work due to BP being elevated.  They used a wrist cuff.  She works 3rd shift, has been relatively good at take BP medication, but missed several days last week, and on average forgets to take the BP medication 1 day per week.  She has gained weight since last visit.  She starts here work day at [employer], confused about the time of day she should be taking the medication.  She has no symptoms, no CP, SOB, DOE, edema, syncope . No other c/o.   ROS as in subjective   Objective: Filed Vitals:   04/24/14 0948  BP: 142/90  Pulse: 88  Temp: 98.5 F (36.9 C)  Resp: 16   General appearance: alert, no distress, WD/WN Neck: supple, no lymphadenopathy, no thyromegaly, no masses Heart: RRR, normal S1, S2, no murmurs Lungs: CTA bilaterally, no wheezes, rhonchi, or rales Pulses: 2+ symmetric, upper and lower extremities, normal cap refill Ext: no edema    Assessment:  Encounter Diagnoses  Name Primary?  . Essential hypertension Yes  . Obesity     Plan: Addressed the weight gain, noncompliant on medication and BP.  Increased Losartan HCT dose today.   Specific recommendations today include:  Change to Losartan HCT 100/12.5mg , one tablet daily.  This is a higher dose of your blood pressure medication  Take the blood pressure medication at the start of your work day.   Avoid added salt in diet  Really work hard on cutting down some weight through healthy low fat diet and routine exercise  Return here in 1 month for a nurse visit to check your weight and blood pressure  Ask the dentist to have you sit for 5 minutes prior to checking your blood pressure  Try to relax before having BP taken  Ask the dentist to use a large cuff when checking your blood pressure.  Considering getting your own cuff to monitor your  blood pressure periodically  Gave handout on diet recommendations.

## 2014-04-24 NOTE — Patient Instructions (Signed)
  Thank you for giving me the opportunity to serve you today.    Your diagnosis today includes: Encounter Diagnoses  Name Primary?  . Essential hypertension Yes  . Obesity      Specific recommendations today include:  Change to Losartan HCT 100/12.5mg , one tablet daily.  This is a higher dose of your blood pressure medication  Take the blood pressure medication at the start of your work day.   Avoid added salt in diet  Really work hard on cutting down some weight through healthy low fat diet and routine exercise  Return here in 1 month for a nurse visit to check your weight and blood pressure  Ask the dentist to have you sit for 5 minutes prior to checking your blood pressure  Try to relax before having BP taken  Ask the dentist to use a large cuff when checking your blood pressure.  Considering getting your own cuff to monitor your blood pressure periodically   I have included other useful information below for your review.   I recommend exercising most days of the week using a type of exercise that they would enjoy and stick to such as walking, running, swimming, hiking, biking, aerobics, etc.  I recommend a healthy diet.    Do's:   whole grains such as whole grain pasta, rice, whole grains breads and whole grain cereals.  Use small quantities such as 1/2 cup per serving or 2 slices of bread per serving.    Eat 3-5 fruits daily  Eat beans at least once daily  Eat almonds in small quantities at least 3 days per week    If they eat meat, I recommend small portions of lean meats such as chicken, fish, and [place].  Eat as much NON corn and NON potato vegetables as they like, particularly raw or steamed  Drink several large glasses of water daily  Cautions:  Limit red meat  Limit corn and potatoes  Limit sweets, cake, pie, candy  Limit beer and alcohol  Avoid fried food, fast food, large portions  Avoid sugary drinks such as regular soda and sweet  tea

## 2014-05-25 ENCOUNTER — Other Ambulatory Visit: Payer: Self-pay

## 2014-08-21 ENCOUNTER — Other Ambulatory Visit: Payer: Self-pay | Admitting: Family Medicine

## 2014-08-21 ENCOUNTER — Telehealth: Payer: Self-pay | Admitting: Internal Medicine

## 2014-08-21 MED ORDER — OMEPRAZOLE 20 MG PO CPDR: 20.0000 mg | DELAYED_RELEASE_CAPSULE | Freq: Every day | ORAL | Status: DC

## 2014-08-21 MED ORDER — LOSARTAN POTASSIUM-HCTZ 100-12.5 MG PO TABS: 1.0000 | ORAL_TABLET | Freq: Every day | ORAL | Status: DC

## 2014-08-21 NOTE — Telephone Encounter (Signed)
Refill request for losartan-hctz 100-12.5,omeprazole Dr 20mg , [facility]

## 2014-08-21 NOTE — Telephone Encounter (Signed)
Rx refills was sent to the pharmacy [facility] 2 medications sent)

## 2014-11-01 ENCOUNTER — Other Ambulatory Visit: Payer: Self-pay | Admitting: Medical

## 2014-11-28 ENCOUNTER — Encounter: Payer: Self-pay | Admitting: Internal Medicine

## 2014-11-28 ENCOUNTER — Other Ambulatory Visit (INDEPENDENT_AMBULATORY_CARE_PROVIDER_SITE_OTHER): Payer: PRIVATE HEALTH INSURANCE

## 2014-11-28 ENCOUNTER — Ambulatory Visit (INDEPENDENT_AMBULATORY_CARE_PROVIDER_SITE_OTHER): Payer: PRIVATE HEALTH INSURANCE | Admitting: Internal Medicine

## 2014-11-28 ENCOUNTER — Other Ambulatory Visit: Payer: Self-pay | Admitting: Internal Medicine

## 2014-11-28 VITALS — BP 118/76 | HR 73 | Ht 64.5 in | Wt 363.0 lb

## 2014-11-28 DIAGNOSIS — N938 Other specified abnormal uterine and vaginal bleeding: Secondary | ICD-10-CM | POA: Insufficient documentation

## 2014-11-28 DIAGNOSIS — Z6841 Body Mass Index (BMI) 40.0 and over, adult: Secondary | ICD-10-CM

## 2014-11-28 DIAGNOSIS — G43809 Other migraine, not intractable, without status migrainosus: Secondary | ICD-10-CM

## 2014-11-28 DIAGNOSIS — Z0000 Encounter for general adult medical examination without abnormal findings: Secondary | ICD-10-CM

## 2014-11-28 DIAGNOSIS — I1A0 Resistant hypertension: Secondary | ICD-10-CM

## 2014-11-28 DIAGNOSIS — I10 Essential (primary) hypertension: Secondary | ICD-10-CM | POA: Insufficient documentation

## 2014-11-28 DIAGNOSIS — K219 Gastro-esophageal reflux disease without esophagitis: Secondary | ICD-10-CM | POA: Insufficient documentation

## 2014-11-28 DIAGNOSIS — G43909 Migraine, unspecified, not intractable, without status migrainosus: Secondary | ICD-10-CM | POA: Insufficient documentation

## 2014-11-28 DIAGNOSIS — J3089 Other allergic rhinitis: Secondary | ICD-10-CM

## 2014-11-28 DIAGNOSIS — J309 Allergic rhinitis, unspecified: Secondary | ICD-10-CM | POA: Insufficient documentation

## 2014-11-28 DIAGNOSIS — E785 Hyperlipidemia, unspecified: Secondary | ICD-10-CM

## 2014-11-28 HISTORY — DX: Essential (primary) hypertension: I10

## 2014-11-28 HISTORY — DX: Resistant hypertension: I1A.0

## 2014-11-28 LAB — TSH: TSH: 2.74 u[IU]/mL (ref 0.35–4.50)

## 2014-11-28 LAB — COMPREHENSIVE METABOLIC PANEL
ALT: 17 U/L (ref 0–35)
AST: 15 U/L (ref 0–37)
Albumin: 4 g/dL (ref 3.5–5.2)
Alkaline Phosphatase: 67 U/L (ref 39–117)
BUN: 10 mg/dL (ref 6–23)
CO2: 28 meq/L (ref 19–32)
CREATININE: 0.89 mg/dL (ref 0.40–1.20)
Calcium: 9.7 mg/dL (ref 8.4–10.5)
Chloride: 105 mEq/L (ref 96–112)
GFR: 94.88 mL/min (ref >60.00)
GLUCOSE: 96 mg/dL (ref 70–99)
Potassium: 3.8 mEq/L (ref 3.5–5.1)
Sodium: 138 mEq/L (ref 135–145)
TOTAL PROTEIN: 7.5 g/dL (ref 6.0–8.3)
Total Bilirubin: 0.5 mg/dL (ref 0.2–1.2)

## 2014-11-28 LAB — CBC
HCT: 34.3 % — ABNORMAL LOW (ref 36.0–46.0)
HEMOGLOBIN: 11.1 g/dL — AB (ref 12.0–15.0)
MCHC: 32.5 g/dL (ref 30.0–36.0)
MCV: 72.1 fl — ABNORMAL LOW (ref 78.0–100.0)
PLATELETS: 475 10*3/uL — AB (ref 150.0–400.0)
RBC: 4.75 Mil/uL (ref 3.87–5.11)
RDW: 20.7 % — ABNORMAL HIGH (ref 11.5–15.5)
WBC: 10 10*3/uL (ref 4.0–10.5)

## 2014-11-28 LAB — LIPID PANEL
Cholesterol: 164 mg/dL (ref 0–200)
HDL: 34.3 mg/dL — AB (ref >39.00)
LDL Cholesterol: 95 mg/dL (ref 0–99)
NONHDL: 129.7
Total CHOL/HDL Ratio: 5
Triglycerides: 175 mg/dL — ABNORMAL HIGH (ref 0.0–149.0)
VLDL: 35 mg/dL (ref 0.0–40.0)

## 2014-11-28 LAB — HEMOGLOBIN A1C: Hgb A1c MFr Bld: 4.9 % (ref 4.6–6.5)

## 2014-11-28 MED ORDER — MEDROXYPROGESTERONE ACETATE 10 MG PO TABS: 20.0000 mg | ORAL_TABLET | Freq: Every day | ORAL | Status: DC

## 2014-11-28 MED ORDER — DROSPIRENONE-ETHINYL ESTRADIOL 3-0.02 MG PO TABS: 1.0000 | ORAL_TABLET | Freq: Every day | ORAL | Status: DC

## 2014-11-28 MED ORDER — NORGESTIMATE-ETH ESTRADIOL 0.25-35 MG-MCG PO TABS: 1.0000 | ORAL_TABLET | Freq: Every day | ORAL | Status: DC

## 2014-11-28 MED ORDER — TOPIRAMATE 50 MG PO TABS: 50.0000 mg | ORAL_TABLET | Freq: Two times a day (BID) | ORAL | Status: DC

## 2014-11-28 NOTE — Assessment & Plan Note (Signed)
Topamax doing well for prevention of her migraines and she has not had one in some time. Will re-address at next visit.

## 2014-11-28 NOTE — Assessment & Plan Note (Signed)
Check lipid panel today. Currently taking zocor 10 mg daily and adjust as needed.

## 2014-11-28 NOTE — Progress Notes (Signed)
   Subjective:    Patient ID: Emily Phelps, female    DOB: 03-23-1981, 34 y.o.   MRN: [id number]  HPI The patient is a 34 YO female who is coming in new for some dysfunctional uterine bleeding. There is a strong family history of fibroids in her family. She had been on birth control in the past which seems to help some. She has been bleeding since March. Sometimes better or worse but steady. She is feeling okay and denies lightheadedness. She is a little tired and has put on a lot of weight in the last year.   PMH, [facility], social history reviewed and updated with the patient.   Review of Systems  Constitutional: Negative for fever, activity change, appetite change, fatigue and unexpected weight change.  Eyes: Negative.   Respiratory: Negative for cough, chest tightness, shortness of breath and wheezing.   Cardiovascular: Negative for chest pain, palpitations and leg swelling.  Gastrointestinal: Negative for abdominal pain, diarrhea, constipation and abdominal distention.  Genitourinary: Positive for vaginal bleeding. Negative for vaginal discharge, vaginal pain, menstrual problem and pelvic pain.  Musculoskeletal: Positive for arthralgias. Negative for joint swelling.  Neurological: Negative.   Psychiatric/Behavioral: Negative.       Objective:   Physical Exam  Constitutional: She is oriented to person, place, and time. She appears well-developed and well-nourished.  Morbidly obese  HENT:  Head: Normocephalic and atraumatic.  Eyes: EOM are normal.  Neck: Normal range of motion.  Cardiovascular: Normal rate and regular rhythm.   Pulmonary/Chest: Effort normal. No respiratory distress. She has no wheezes. She has no rales.  Abdominal: Soft. She exhibits no distension. There is no tenderness.  Musculoskeletal: She exhibits no edema.  Neurological: She is alert and oriented to person, place, and time. Coordination normal.  Skin: Skin is warm and dry.  Psychiatric: She has a normal mood  and affect.   Filed Vitals:   11/28/14 0809  BP: 118/76  Pulse: 73  Height: 5' 4.5" (1.638 m)  Weight: 363 lb (164.656 kg)  SpO2: 98%      Assessment & Plan:

## 2014-11-28 NOTE — Assessment & Plan Note (Signed)
She is having complications with hypertension, hyperlipidemia. Will check for HgA1c and lipid and labs today. Talked to her about the need to work on exercise and working on her weight. Will continue to address as the bleeding is the primary issue today.

## 2014-11-28 NOTE — Patient Instructions (Signed)
We have sent in the birth control pills which you can start taking. We have also sent in the progesterone which you should take 2 pills by mouth for 7 days then stop.   We will get you in with the gynecologist and you should hear back about that this week.   We will check the blood work today and call you back with the results.   Health Maintenance Adopting a healthy lifestyle and getting preventive care can go a long way to promote health and wellness. Talk with your health care provider about what schedule of regular examinations is right for you. This is a good chance for you to check in with your provider about disease prevention and staying healthy. In between checkups, there are plenty of things you can do on your own. Experts have done a lot of research about which lifestyle changes and preventive measures are most likely to keep you healthy. Ask your health care provider for more information. WEIGHT AND DIET  Eat a healthy diet  Be sure to include plenty of vegetables, fruits, low-fat dairy products, and lean protein.  Do not eat a lot of foods high in solid fats, added sugars, or salt.  Get regular exercise. This is one of the most important things you can do for your health.  Most adults should exercise for at least 150 minutes each week. The exercise should increase your heart rate and make you sweat (moderate-intensity exercise).  Most adults should also do strengthening exercises at least twice a week. This is in addition to the moderate-intensity exercise.  Maintain a healthy weight  Body mass index (BMI) is a measurement that can be used to identify possible weight problems. It estimates body fat based on height and weight. Your health care provider can help determine your BMI and help you achieve or maintain a healthy weight.  For females 42 years of age and older:   A BMI below 18.5 is considered underweight.  A BMI of 18.5 to 24.9 is normal.  A BMI of 25 to 29.9 is  considered overweight.  A BMI of 30 and above is considered obese.  Watch levels of cholesterol and blood lipids  You should start having your blood tested for lipids and cholesterol at 32 years of age, then have this test every 5 years.  You may need to have your cholesterol levels checked more often if:  Your lipid or cholesterol levels are high.  You are older than 32 years of age.  You are at high risk for heart disease.  CANCER SCREENING   Lung Cancer  Lung cancer screening is recommended for adults 22-30 years old who are at high risk for lung cancer because of a history of smoking.  A yearly low-dose CT scan of the lungs is recommended for people who:  Currently smoke.  Have quit within the past 15 years.  Have at least a 30-pack-year history of smoking. A pack year is smoking an average of one pack of cigarettes a day for 1 year.  Yearly screening should continue until it has been 15 years since you quit.  Yearly screening should stop if you develop a health problem that would prevent you from having lung cancer treatment.  Breast Cancer  Practice breast self-awareness. This means understanding how your breasts normally appear and feel.  It also means doing regular breast self-exams. Let your health care provider know about any changes, no matter how small.  If you are in your  66s or 49s, you should have a clinical breast exam (CBE) by a health care provider every 1-3 years as part of a regular health exam.  If you are 54 or older, have a CBE every year. Also consider having a breast X-ray (mammogram) every year.  If you have a family history of breast cancer, talk to your health care provider about genetic screening.  If you are at high risk for breast cancer, talk to your health care provider about having an MRI and a mammogram every year.  Breast cancer gene (BRCA) assessment is recommended for women who have family members with BRCA-related cancers.  BRCA-related cancers include:  Breast.  Ovarian.  Tubal.  Peritoneal cancers.  Results of the assessment will determine the need for genetic counseling and BRCA1 and BRCA2 testing. Cervical Cancer Routine pelvic examinations to screen for cervical cancer are no longer recommended for nonpregnant women who are considered low risk for cancer of the pelvic organs (ovaries, uterus, and vagina) and who do not have symptoms. A pelvic examination may be necessary if you have symptoms including those associated with pelvic infections. Ask your health care provider if a screening pelvic exam is right for you.   The Pap test is the screening test for cervical cancer for women who are considered at risk.  If you had a hysterectomy for a problem that was not cancer or a condition that could lead to cancer, then you no longer need Pap tests.  If you are older than 65 years, and you have had normal Pap tests for the past 10 years, you no longer need to have Pap tests.  If you have had past treatment for cervical cancer or a condition that could lead to cancer, you need Pap tests and screening for cancer for at least 20 years after your treatment.  If you no longer get a Pap test, assess your risk factors if they change (such as having a new sexual partner). This can affect whether you should start being screened again.  Some women have medical problems that increase their chance of getting cervical cancer. If this is the case for you, your health care provider may recommend more frequent screening and Pap tests.  The human papillomavirus (HPV) test is another test that may be used for cervical cancer screening. The HPV test looks for the virus that can cause cell changes in the cervix. The cells collected during the Pap test can be tested for HPV.  The HPV test can be used to screen women 25 years of age and older. Getting tested for HPV can extend the interval between normal Pap tests from three to  five years.  An HPV test also should be used to screen women of any age who have unclear Pap test results.  After 32 years of age, women should have HPV testing as often as Pap tests.  Colorectal Cancer  This type of cancer can be detected and often prevented.  Routine colorectal cancer screening usually begins at 32 years of age and continues through 32 years of age.  Your health care provider may recommend screening at an earlier age if you have risk factors for colon cancer.  Your health care provider may also recommend using home test kits to check for hidden blood in the stool.  A small camera at the end of a tube can be used to examine your colon directly (sigmoidoscopy or colonoscopy). This is done to check for the earliest forms of colorectal  cancer.  Routine screening usually begins at age 46.  Direct examination of the colon should be repeated every 5-10 years through 32 years of age. However, you may need to be screened more often if early forms of precancerous polyps or small growths are found. Skin Cancer  Check your skin from head to toe regularly.  Tell your health care provider about any new moles or changes in moles, especially if there is a change in a mole's shape or color.  Also tell your health care provider if you have a mole that is larger than the size of a pencil eraser.  Always use sunscreen. Apply sunscreen liberally and repeatedly throughout the day.  Protect yourself by wearing long sleeves, pants, a wide-brimmed hat, and sunglasses whenever you are outside. HEART DISEASE, DIABETES, AND HIGH BLOOD PRESSURE   Have your blood pressure checked at least every 1-2 years. High blood pressure causes heart disease and increases the risk of stroke.  If you are between 93 years and 47 years old, ask your health care provider if you should take aspirin to prevent strokes.  Have regular diabetes screenings. This involves taking a blood sample to check your  fasting blood sugar level.  If you are at a normal weight and have a low risk for diabetes, have this test once every three years after 32 years of age.  If you are overweight and have a high risk for diabetes, consider being tested at a younger age or more often. PREVENTING INFECTION  Hepatitis B  If you have a higher risk for hepatitis B, you should be screened for this virus. You are considered at high risk for hepatitis B if:  You were born in a country where hepatitis B is common. Ask your health care provider which countries are considered high risk.  Your parents were born in a high-risk country, and you have not been immunized against hepatitis B (hepatitis B vaccine).  You have HIV or AIDS.  You use needles to inject street drugs.  You live with someone who has hepatitis B.  You have had sex with someone who has hepatitis B.  You get hemodialysis treatment.  You take certain medicines for conditions, including cancer, organ transplantation, and autoimmune conditions. Hepatitis C  Blood testing is recommended for:  Everyone born from [birth year] through 1963.  Anyone with known risk factors for hepatitis C. Sexually transmitted infections (STIs)  You should be screened for sexually transmitted infections (STIs) including gonorrhea and chlamydia if:  You are sexually active and are younger than 32 years of age.  You are older than 32 years of age and your health care provider tells you that you are at risk for this type of infection.  Your sexual activity has changed since you were last screened and you are at an increased risk for chlamydia or gonorrhea. Ask your health care provider if you are at risk.  If you do not have HIV, but are at risk, it may be recommended that you take a prescription medicine daily to prevent HIV infection. This is called pre-exposure prophylaxis (PrEP). You are considered at risk if:  You are sexually active and do not regularly use condoms or  know the HIV status of your partner(s).  You take drugs by injection.  You are sexually active with a partner who has HIV. Talk with your health care provider about whether you are at high risk of being infected with HIV. If you choose to begin PrEP, you  should first be tested for HIV. You should then be tested every 3 months for as long as you are taking PrEP.  PREGNANCY   If you are premenopausal and you may become pregnant, ask your health care provider about preconception counseling.  If you may become pregnant, take 400 to 800 micrograms (mcg) of folic acid every day.  If you want to prevent pregnancy, talk to your health care provider about birth control (contraception). OSTEOPOROSIS AND MENOPAUSE   Osteoporosis is a disease in which the bones lose minerals and strength with aging. This can result in serious bone fractures. Your risk for osteoporosis can be identified using a bone density scan.  If you are 63 years of age or older, or if you are at risk for osteoporosis and fractures, ask your health care provider if you should be screened.  Ask your health care provider whether you should take a calcium or vitamin D supplement to lower your risk for osteoporosis.  Menopause may have certain physical symptoms and risks.  Hormone replacement therapy may reduce some of these symptoms and risks. Talk to your health care provider about whether hormone replacement therapy is right for you.  HOME CARE INSTRUCTIONS   Schedule regular health, dental, and eye exams.  Stay current with your immunizations.   Do not use any tobacco products including cigarettes, chewing tobacco, or electronic cigarettes.  If you are pregnant, do not drink alcohol.  If you are breastfeeding, limit how much and how often you drink alcohol.  Limit alcohol intake to no more than 1 drink per day for nonpregnant women. One drink equals 12 ounces of beer, 5 ounces of wine, or 1 ounces of hard liquor.  Do  not use street drugs.  Do not share needles.  Ask your health care provider for help if you need support or information about quitting drugs.  Tell your health care provider if you often feel depressed.  Tell your health care provider if you have ever been abused or do not feel safe at home. Document Released: 01/13/2011 Document Revised: 11/14/2013 Document Reviewed: 06/01/2013 [facility] Patient Information 2015 [place], [place]. This information is not intended to replace advice given to you by your health care provider. Make sure you discuss any questions you have with your health care provider.

## 2014-11-28 NOTE — Telephone Encounter (Signed)
Called in alternative.  

## 2014-11-28 NOTE — Assessment & Plan Note (Signed)
Referral to Gyn, check CBC. Progesterone for 1 week and start birth control as well for control of hormones.

## 2014-11-28 NOTE — Assessment & Plan Note (Signed)
Losartan/hctz 100/12.5 mg daily. BP controlled today. Check labs.

## 2014-11-28 NOTE — Assessment & Plan Note (Signed)
Currently taking zyrtec OTC with good relief.

## 2014-11-28 NOTE — Progress Notes (Signed)
Pre visit review using our clinic review tool, if applicable. No additional management support is needed unless otherwise documented below in the visit note. 

## 2014-12-14 ENCOUNTER — Ambulatory Visit (INDEPENDENT_AMBULATORY_CARE_PROVIDER_SITE_OTHER): Payer: PRIVATE HEALTH INSURANCE | Admitting: Women's Health

## 2014-12-14 ENCOUNTER — Encounter: Payer: Self-pay | Admitting: Women's Health

## 2014-12-14 ENCOUNTER — Other Ambulatory Visit (HOSPITAL_COMMUNITY)
Admission: RE | Admit: 2014-12-14 | Discharge: 2014-12-14 | Disposition: A | Discharge status: Home / Self Care | Payer: PRIVATE HEALTH INSURANCE | Source: Ambulatory Visit | Attending: Women's Health | Admitting: Women's Health

## 2014-12-14 VITALS — BP 124/80 | Ht 64.0 in | Wt 362.0 lb

## 2014-12-14 DIAGNOSIS — Z01419 Encounter for gynecological examination (general) (routine) without abnormal findings: Secondary | ICD-10-CM | POA: Diagnosis not present

## 2014-12-14 DIAGNOSIS — A499 Bacterial infection, unspecified: Secondary | ICD-10-CM

## 2014-12-14 DIAGNOSIS — Z113 Encounter for screening for infections with a predominantly sexual mode of transmission: Secondary | ICD-10-CM | POA: Insufficient documentation

## 2014-12-14 DIAGNOSIS — Z1151 Encounter for screening for human papillomavirus (HPV): Secondary | ICD-10-CM | POA: Insufficient documentation

## 2014-12-14 DIAGNOSIS — N938 Other specified abnormal uterine and vaginal bleeding: Secondary | ICD-10-CM | POA: Diagnosis not present

## 2014-12-14 DIAGNOSIS — N760 Acute vaginitis: Secondary | ICD-10-CM | POA: Diagnosis not present

## 2014-12-14 DIAGNOSIS — B9689 Other specified bacterial agents as the cause of diseases classified elsewhere: Secondary | ICD-10-CM

## 2014-12-14 LAB — WET PREP FOR TRICH, YEAST, CLUE
Trich, Wet Prep: NONE SEEN
WBC, Wet Prep HPF POC: NONE SEEN
Yeast Wet Prep HPF POC: NONE SEEN

## 2014-12-14 MED ORDER — METRONIDAZOLE 500 MG PO TABS: 500.0000 mg | ORAL_TABLET | Freq: Two times a day (BID) | ORAL | Status: DC

## 2014-12-14 NOTE — Progress Notes (Signed)
Emily Phelps 1980/11/30 [id number]    History:    Presents for annual exam.  Irregular bleeding, recently started on Sprintec per primary care for irregular cycles. Reports has always had heavy longer cycles with irregular pattern since starting cycles. Reports all labs were normal TSH, CBC, and reports normal Pap history. Reports negative STD screen January 2016, new partner/condoms. Hypertension hypercholesterolemia managed by primary care. History of positive chlamydia 2012.   Past medical history, past surgical history, family history and social history were all reviewed and documented in the EPIC chart. Desk job. Morbid obesity reports gaining 50 pounds over the past year. Brother, mother hypertension, father diabetes. Mother fibroids.  ROS:  A ROS was performed and pertinent positives and negatives are included.  Exam:  Filed Vitals:   12/14/14 0840  BP: 124/80    General appearance:  Obese  Thyroid:  Symmetrical, normal in size, without palpable masses or nodularity. Respiratory  Auscultation:  Clear without wheezing or rhonchi Cardiovascular  Auscultation:  Regular rate, without rubs, murmurs or gallops  Edema/varicosities:  Not grossly evident Abdominal  Soft,nontender, without masses, guarding or rebound.  Liver/spleen:  No organomegaly noted  Hernia:  None appreciated  Skin  Inspection:  Grossly normal   Breasts: Examined lying and sitting.     Right: Without masses, retractions, discharge or axillary adenopathy.     Left: Without masses, retractions, discharge or axillary adenopathy. Gentitourinary   Inguinal/mons:  Normal without inguinal adenopathy  External genitalia:  Normal  BUS/Urethra/Skene's glands:  Normal  Vagina:  Normal  Cervix:  Normal  Uterus:  normal in size, shape and contour.  Midline and mobile  Adnexa/parametria:     Rt: Without masses or tenderness.   Lt: Without masses or tenderness.  Anus and perineum: Normal  Digital rectal  exam: Normal sphincter tone without palpated masses or tenderness  Assessment/Plan:  34 y.o. SBF G0 for annual exam with complaint of irregular cycles  DUB Bacteria vaginosis STD screen Hypertension/hypercholesterolemia-primary care managing labs and meds Morbid obesity limited pelvic exam  Plan: GC/Chlamydia, declines need for HIV, hepatitis or RPR.  Flagyl 500 twice daily for 7 days, #14, alcohol precautions reviewed. Instructed to call if no relief of discharge. Will continue Sprintec reviewed risk for blood clots and strokes in light of hypertension.. Will schedule sonohysterogram with Dr. Phineas Real after next cycle. UA, Pap, new screening guidelines reviewed. SBE's, increase regular exercise and decrease calories for weight loss and MVI daily encouraged.  Huel Cote WHNP, 1:12 PM 12/14/2014

## 2014-12-14 NOTE — Patient Instructions (Addendum)
Health Maintenance Adopting a healthy lifestyle and getting preventive care can go a long way to promote health and wellness. Talk with your health care provider about what schedule of regular examinations is right for you. This is a good chance for you to check in with your provider about disease prevention and staying healthy. In between checkups, there are plenty of things you can do on your own. Experts have done a lot of research about which lifestyle changes and preventive measures are most likely to keep you healthy. Ask your health care provider for more information. WEIGHT AND DIET  Eat a healthy diet  Be sure to include plenty of vegetables, fruits, low-fat dairy products, and lean protein.  Do not eat a lot of foods high in solid fats, added sugars, or salt.  Get regular exercise. This is one of the most important things you can do for your health.  Most adults should exercise for at least 150 minutes each week. The exercise should increase your heart rate and make you sweat (moderate-intensity exercise).  Most adults should also do strengthening exercises at least twice a week. This is in addition to the moderate-intensity exercise.  Maintain a healthy weight  Body mass index (BMI) is a measurement that can be used to identify possible weight problems. It estimates body fat based on height and weight. Your health care provider can help determine your BMI and help you achieve or maintain a healthy weight.  For females 25 years of age and older:   A BMI below 18.5 is considered underweight.  A BMI of 18.5 to 24.9 is normal.  A BMI of 25 to 29.9 is considered overweight.  A BMI of 30 and above is considered obese.  Watch levels of cholesterol and blood lipids  You should start having your blood tested for lipids and cholesterol at 32 years of age, then have this test every 5 years.  You may need to have your cholesterol levels checked more often if:  Your lipid or  cholesterol levels are high.  You are older than 32 years of age.  You are at high risk for heart disease.  CANCER SCREENING   Lung Cancer  Lung cancer screening is recommended for adults 97-92 years old who are at high risk for lung cancer because of a history of smoking.  A yearly low-dose CT scan of the lungs is recommended for people who:  Currently smoke.  Have quit within the past 15 years.  Have at least a 30-pack-year history of smoking. A pack year is smoking an average of one pack of cigarettes a day for 1 year.  Yearly screening should continue until it has been 15 years since you quit.  Yearly screening should stop if you develop a health problem that would prevent you from having lung cancer treatment.  Breast Cancer  Practice breast self-awareness. This means understanding how your breasts normally appear and feel.  It also means doing regular breast self-exams. Let your health care provider know about any changes, no matter how small.  If you are in your 20s or 30s, you should have a clinical breast exam (CBE) by a health care provider every 1-3 years as part of a regular health exam.  If you are 76 or older, have a CBE every year. Also consider having a breast X-ray (mammogram) every year.  If you have a family history of breast cancer, talk to your health care provider about genetic screening.  If you are  at high risk for breast cancer, talk to your health care provider about having an MRI and a mammogram every year.  Breast cancer gene (BRCA) assessment is recommended for women who have family members with BRCA-related cancers. BRCA-related cancers include:  Breast.  Ovarian.  Tubal.  Peritoneal cancers.  Results of the assessment will determine the need for genetic counseling and BRCA1 and BRCA2 testing. Cervical Cancer Routine pelvic examinations to screen for cervical cancer are no longer recommended for nonpregnant women who are considered low  risk for cancer of the pelvic organs (ovaries, uterus, and vagina) and who do not have symptoms. A pelvic examination may be necessary if you have symptoms including those associated with pelvic infections. Ask your health care provider if a screening pelvic exam is right for you.   The Pap test is the screening test for cervical cancer for women who are considered at risk.  If you had a hysterectomy for a problem that was not cancer or a condition that could lead to cancer, then you no longer need Pap tests.  If you are older than 65 years, and you have had normal Pap tests for the past 10 years, you no longer need to have Pap tests.  If you have had past treatment for cervical cancer or a condition that could lead to cancer, you need Pap tests and screening for cancer for at least 20 years after your treatment.  If you no longer get a Pap test, assess your risk factors if they change (such as having a new sexual partner). This can affect whether you should start being screened again.  Some women have medical problems that increase their chance of getting cervical cancer. If this is the case for you, your health care provider may recommend more frequent screening and Pap tests.  The human papillomavirus (HPV) test is another test that may be used for cervical cancer screening. The HPV test looks for the virus that can cause cell changes in the cervix. The cells collected during the Pap test can be tested for HPV.  The HPV test can be used to screen women 30 years of age and older. Getting tested for HPV can extend the interval between normal Pap tests from three to five years.  An HPV test also should be used to screen women of any age who have unclear Pap test results.  After 32 years of age, women should have HPV testing as often as Pap tests.  Colorectal Cancer  This type of cancer can be detected and often prevented.  Routine colorectal cancer screening usually begins at 32 years of  age and continues through 32 years of age.  Your health care provider may recommend screening at an earlier age if you have risk factors for colon cancer.  Your health care provider may also recommend using home test kits to check for hidden blood in the stool.  A small camera at the end of a tube can be used to examine your colon directly (sigmoidoscopy or colonoscopy). This is done to check for the earliest forms of colorectal cancer.  Routine screening usually begins at age 50.  Direct examination of the colon should be repeated every 5-10 years through 32 years of age. However, you may need to be screened more often if early forms of precancerous polyps or small growths are found. Skin Cancer  Check your skin from head to toe regularly.  Tell your health care provider about any new moles or changes in   moles, especially if there is a change in a mole's shape or color.  Also tell your health care provider if you have a mole that is larger than the size of a pencil eraser.  Always use sunscreen. Apply sunscreen liberally and repeatedly throughout the day.  Protect yourself by wearing long sleeves, pants, a wide-brimmed hat, and sunglasses whenever you are outside. HEART DISEASE, DIABETES, AND HIGH BLOOD PRESSURE   Have your blood pressure checked at least every 1-2 years. High blood pressure causes heart disease and increases the risk of stroke.  If you are between 75 years and 42 years old, ask your health care provider if you should take aspirin to prevent strokes.  Have regular diabetes screenings. This involves taking a blood sample to check your fasting blood sugar level.  If you are at a normal weight and have a low risk for diabetes, have this test once every three years after 32 years of age.  If you are overweight and have a high risk for diabetes, consider being tested at a younger age or more often. PREVENTING INFECTION  Hepatitis B  If you have a higher risk for  hepatitis B, you should be screened for this virus. You are considered at high risk for hepatitis B if:  You were born in a country where hepatitis B is common. Ask your health care provider which countries are considered high risk.  Your parents were born in a high-risk country, and you have not been immunized against hepatitis B (hepatitis B vaccine).  You have HIV or AIDS.  You use needles to inject street drugs.  You live with someone who has hepatitis B.  You have had sex with someone who has hepatitis B.  You get hemodialysis treatment.  You take certain medicines for conditions, including cancer, organ transplantation, and autoimmune conditions. Hepatitis C  Blood testing is recommended for:  Everyone born from [birth year] through 1963.  Anyone with known risk factors for hepatitis C. Sexually transmitted infections (STIs)  You should be screened for sexually transmitted infections (STIs) including gonorrhea and chlamydia if:  You are sexually active and are younger than 32 years of age.  You are older than 32 years of age and your health care provider tells you that you are at risk for this type of infection.  Your sexual activity has changed since you were last screened and you are at an increased risk for chlamydia or gonorrhea. Ask your health care provider if you are at risk.  If you do not have HIV, but are at risk, it may be recommended that you take a prescription medicine daily to prevent HIV infection. This is called pre-exposure prophylaxis (PrEP). You are considered at risk if:  You are sexually active and do not regularly use condoms or know the HIV status of your partner(s).  You take drugs by injection.  You are sexually active with a partner who has HIV. Talk with your health care provider about whether you are at high risk of being infected with HIV. If you choose to begin PrEP, you should first be tested for HIV. You should then be tested every 3 months for  as long as you are taking PrEP.  PREGNANCY   If you are premenopausal and you may become pregnant, ask your health care provider about preconception counseling.  If you may become pregnant, take 400 to 800 micrograms (mcg) of folic acid every day.  If you want to prevent pregnancy, talk to your  health care provider about birth control (contraception). OSTEOPOROSIS AND MENOPAUSE   Osteoporosis is a disease in which the bones lose minerals and strength with aging. This can result in serious bone fractures. Your risk for osteoporosis can be identified using a bone density scan.  If you are 35 years of age or older, or if you are at risk for osteoporosis and fractures, ask your health care provider if you should be screened.  Ask your health care provider whether you should take a calcium or vitamin D supplement to lower your risk for osteoporosis.  Menopause may have certain physical symptoms and risks.  Hormone replacement therapy may reduce some of these symptoms and risks. Talk to your health care provider about whether hormone replacement therapy is right for you.  HOME CARE INSTRUCTIONS   Schedule regular health, dental, and eye exams.  Stay current with your immunizations.   Do not use any tobacco products including cigarettes, chewing tobacco, or electronic cigarettes.  If you are pregnant, do not drink alcohol.  If you are breastfeeding, limit how much and how often you drink alcohol.  Limit alcohol intake to no more than 1 drink per day for nonpregnant women. One drink equals 12 ounces of beer, 5 ounces of wine, or 1 ounces of hard liquor.  Do not use street drugs.  Do not share needles.  Ask your health care provider for help if you need support or information about quitting drugs.  Tell your health care provider if you often feel depressed.  Tell your health care provider if you have ever been abused or do not feel safe at home. Document Released: 01/13/2011  Document Revised: 11/14/2013 Document Reviewed: 06/01/2013 [facility] Patient Information 2015 [place], [place]. This information is not intended to replace advice given to you by your health care provider. Make sure you discuss any questions you have with your health care provider. Exercise to Stay Healthy Exercise helps you become and stay healthy. EXERCISE IDEAS AND TIPS Choose exercises that:  You enjoy.  Fit into your day. You do not need to exercise really hard to be healthy. You can do exercises at a slow or medium level and stay healthy. You can:  Stretch before and after working out.  Try yoga, Pilates, or tai chi.  Lift weights.  Walk fast, swim, jog, run, climb stairs, bicycle, dance, or rollerskate.  Take aerobic classes. Exercises that burn about 150 calories:  Running 1  miles in 15 minutes.  Playing volleyball for 45 to 60 minutes.  Washing and waxing a car for 45 to 60 minutes.  Playing touch football for 45 minutes.  Walking 1  miles in 35 minutes.  Pushing a stroller 1  miles in 30 minutes.  Playing basketball for 30 minutes.  Raking leaves for 30 minutes.  Bicycling 5 miles in 30 minutes.  Walking 2 miles in 30 minutes.  Dancing for 30 minutes.  Shoveling snow for 15 minutes.  Swimming laps for 20 minutes.  Walking up stairs for 15 minutes.  Bicycling 4 miles in 15 minutes.  Gardening for 30 to 45 minutes.  Jumping rope for 15 minutes.  Washing windows or floors for 45 to 60 minutes. Document Released: 08/02/2010 Document Revised: 09/22/2011 Document Reviewed: 08/02/2010 [facility] Patient Information 2015 [place], [place]. This information is not intended to replace advice given to you by your health care provider. Make sure you discuss any questions you have with your health care provider. Dysfunctional Uterine Bleeding Normally, menstrual periods begin between  ages 2 to 47 in [place] women. A normal menstrual cycle/period may begin every  23 days up to 35 days and lasts from 1 to 7 days. Around 12 to 14 days before your menstrual period starts, ovulation (ovary produces an egg) occurs. When counting the time between menstrual periods, count from the first day of bleeding of the previous period to the first day of bleeding of the next period. Dysfunctional (abnormal) uterine bleeding is bleeding that is different from a normal menstrual period. Your periods may come earlier or later than usual. They may be lighter, have blood clots or be heavier. You may have bleeding between periods, or you may skip one period or more. You may have bleeding after sexual intercourse, bleeding after menopause, or no menstrual period. CAUSES   Pregnancy (normal, miscarriage, tubal).  IUDs (intrauterine device, birth control).  Birth control pills.  Hormone treatment.  Menopause.  Infection of the cervix.  Blood clotting problems.  Infection of the inside lining of the uterus.  Endometriosis, inside lining of the uterus growing in the pelvis and other female organs.  Adhesions (scar tissue) inside the uterus.  Obesity or severe weight loss.  Uterine polyps inside the uterus.  Cancer of the vagina, cervix, or uterus.  Ovarian cysts or polycystic ovary syndrome.  Medical problems (diabetes, thyroid disease).  Uterine fibroids (noncancerous tumor).  Problems with your female hormones.  Endometrial hyperplasia, very thick lining and enlarged cells inside of the uterus.  Medicines that interfere with ovulation.  Radiation to the pelvis or abdomen.  Chemotherapy. DIAGNOSIS   Your doctor will discuss the history of your menstrual periods, medicines you are taking, changes in your weight, stress in your life, and any medical problems you may have.  Your doctor will do a physical and pelvic examination.  Your doctor may want to perform certain tests to make a diagnosis, such as:  Pap test.  Blood tests.  Cultures for  infection.  CT scan.  Ultrasound.  Hysteroscopy.  Laparoscopy.  MRI.  Hysterosalpingography.  D and C.  Endometrial biopsy. TREATMENT  Treatment will depend on the cause of the dysfunctional uterine bleeding (DUB). Treatment may include:  Observing your menstrual periods for a couple of months.  Prescribing medicines for medical problems, including:  Antibiotics.  Hormones.  Birth control pills.  Removing an IUD (intrauterine device, birth control).  Surgery:  D and C (scrape and remove tissue from inside the uterus).  Laparoscopy (examine inside the abdomen with a lighted tube).  Uterine ablation (destroy lining of the uterus with electrical current, laser, heat, or freezing).  Hysteroscopy (examine cervix and uterus with a lighted tube).  Hysterectomy (remove the uterus). HOME CARE INSTRUCTIONS   If medicines were prescribed, take exactly as directed. Do not change or switch medicines without consulting your caregiver.  Long term heavy bleeding may result in iron deficiency. Your caregiver may have prescribed iron pills. They help replace the iron that your body lost from heavy bleeding. Take exactly as directed.  Do not take aspirin or medicines that contain aspirin one week before or during your menstrual period. Aspirin may make the bleeding worse.  If you need to change your sanitary pad or tampon more than once every 2 hours, stay in bed with your feet elevated and a cold pack on your lower abdomen. Rest as much as possible, until the bleeding stops or slows down.  Eat well-balanced meals. Eat foods high in iron. Examples are:  Leafy green vegetables.  Whole-grain  breads and cereals.  Eggs.  Meat.  Liver.  Do not try to lose weight until the abnormal bleeding has stopped and your blood iron level is back to normal. Do not lift more than ten pounds or do strenuous activities when you are bleeding.  For a couple of months, make note on your  calendar, marking the start and ending of your period, and the type of bleeding (light, medium, heavy, spotting, clots or missed periods). This is for your caregiver to better evaluate your problem. SEEK MEDICAL CARE IF:   You develop nausea (feeling sick to your stomach) and vomiting, dizziness, or diarrhea while you are taking your medicine.  You are getting lightheaded or weak.  You have any problems that may be related to the medicine you are taking.  You develop pain with your DUB.  You want to remove your IUD.  You want to stop or change your birth control pills or hormones.  You have any type of abnormal bleeding mentioned above.  You are over 52 years old and have not had a menstrual period yet.  You are 34 years old and you are still having menstrual periods.  You have any of the symptoms mentioned above.  You develop a rash. SEEK IMMEDIATE MEDICAL CARE IF:   An oral temperature above 102 F (38.9 C) develops.  You develop chills.  You are changing your sanitary pad or tampon more than once an hour.  You develop abdominal pain.  You pass out or faint. Document Released: 06/27/2000 Document Revised: 09/22/2011 Document Reviewed: 05/29/2009 [facility] Patient Information 2015 [place], [place]. This information is not intended to replace advice given to you by your health care provider. Make sure you discuss any questions you have with your health care provider. Bacterial Vaginosis Bacterial vaginosis is a vaginal infection that occurs when the normal balance of bacteria in the vagina is disrupted. It results from an overgrowth of certain bacteria. This is the most common vaginal infection in women of childbearing age. Treatment is important to prevent complications, especially in pregnant women, as it can cause a premature delivery. CAUSES  Bacterial vaginosis is caused by an increase in harmful bacteria that are normally present in smaller amounts in the vagina. Several  different kinds of bacteria can cause bacterial vaginosis. However, the reason that the condition develops is not fully understood. RISK FACTORS Certain activities or behaviors can put you at an increased risk of developing bacterial vaginosis, including:  Having a new sex partner or multiple sex partners.  Douching.  Using an intrauterine device (IUD) for contraception. Women do not get bacterial vaginosis from toilet seats, bedding, swimming pools, or contact with objects around them. SIGNS AND SYMPTOMS  Some women with bacterial vaginosis have no signs or symptoms. Common symptoms include:  Grey vaginal discharge.  A fishlike odor with discharge, especially after sexual intercourse.  Itching or burning of the vagina and vulva.  Burning or pain with urination. DIAGNOSIS  Your health care provider will take a medical history and examine the vagina for signs of bacterial vaginosis. A sample of vaginal fluid may be taken. Your health care provider will look at this sample under a microscope to check for bacteria and abnormal cells. A vaginal pH test may also be done.  TREATMENT  Bacterial vaginosis may be treated with antibiotic medicines. These may be given in the form of a pill or a vaginal cream. A second round of antibiotics may be prescribed if the condition comes back  after treatment.  HOME CARE INSTRUCTIONS   Only take over-the-counter or prescription medicines as directed by your health care provider.  If antibiotic medicine was prescribed, take it as directed. Make sure you finish it even if you start to feel better.  Do not have sex until treatment is completed.  Tell all sexual partners that you have a vaginal infection. They should see their health care provider and be treated if they have problems, such as a mild rash or itching.  Practice safe sex by using condoms and only having one sex partner. SEEK MEDICAL CARE IF:   Your symptoms are not improving after 3 days of  treatment.  You have increased discharge or pain.  You have a fever. MAKE SURE YOU:   Understand these instructions.  Will watch your condition.  Will get help right away if you are not doing well or get worse. FOR MORE INFORMATION  Centers for Disease Control and Prevention, Division of STD Prevention: [employer] American Sexual Health Association (ASHA): www.ashastd.org  Document Released: 06/30/2005 Document Revised: 04/20/2013 Document Reviewed: 02/09/2013 [facility] Patient Information 2015 [place], [place]. This information is not intended to replace advice given to you by your health care provider. Make sure you discuss any questions you have with your health care provider.

## 2014-12-15 LAB — URINALYSIS W MICROSCOPIC + REFLEX CULTURE
BILIRUBIN URINE: NEGATIVE
Bacteria, UA: NONE SEEN
CASTS: NONE SEEN
Crystals: NONE SEEN
GLUCOSE, UA: NEGATIVE mg/dL
Hgb urine dipstick: NEGATIVE
Ketones, ur: NEGATIVE mg/dL
Leukocytes, UA: NEGATIVE
Nitrite: NEGATIVE
Protein, ur: NEGATIVE mg/dL
Specific Gravity, Urine: 1.019 (ref 1.005–1.030)
Urobilinogen, UA: 0.2 mg/dL (ref 0.0–1.0)
pH: 6.5 (ref 5.0–8.0)

## 2014-12-15 LAB — CYTOLOGY - PAP

## 2015-01-02 ENCOUNTER — Telehealth: Payer: Self-pay | Admitting: Gynecology

## 2015-01-02 NOTE — Telephone Encounter (Signed)
01/02/15-Pt was informed today that her PAI insurance will only pay $200 towards the sonohysterogram. Total cost is $851.58 and if biopsy needed an additional $244.51. Asked for $300 down and she could be put on 72m payment plan for balance. Will advise [place] by end of this week if she will proceed.wl

## 2015-01-03 ENCOUNTER — Other Ambulatory Visit: Payer: Self-pay | Admitting: Gynecology

## 2015-01-03 DIAGNOSIS — N938 Other specified abnormal uterine and vaginal bleeding: Secondary | ICD-10-CM

## 2015-01-10 ENCOUNTER — Ambulatory Visit: Payer: PRIVATE HEALTH INSURANCE | Admitting: Gynecology

## 2015-01-10 ENCOUNTER — Other Ambulatory Visit: Payer: PRIVATE HEALTH INSURANCE

## 2015-03-23 ENCOUNTER — Other Ambulatory Visit: Payer: Self-pay | Admitting: Medical

## 2015-05-04 ENCOUNTER — Other Ambulatory Visit: Payer: Self-pay | Admitting: Medical

## 2015-05-04 NOTE — Telephone Encounter (Signed)
Is this ok to refill?  

## 2015-05-07 ENCOUNTER — Other Ambulatory Visit: Payer: Self-pay | Admitting: Medical

## 2015-05-07 ENCOUNTER — Other Ambulatory Visit: Payer: Self-pay | Admitting: Internal Medicine

## 2015-05-08 ENCOUNTER — Other Ambulatory Visit: Payer: Self-pay | Admitting: Internal Medicine

## 2015-05-08 ENCOUNTER — Telehealth: Payer: Self-pay | Admitting: Internal Medicine

## 2015-05-08 ENCOUNTER — Other Ambulatory Visit: Payer: Self-pay | Admitting: Geriatric Medicine

## 2015-05-08 MED ORDER — LOSARTAN POTASSIUM-HCTZ 100-12.5 MG PO TABS: 1.0000 | ORAL_TABLET | Freq: Every day | ORAL | Status: DC

## 2015-05-08 NOTE — Telephone Encounter (Signed)
Sent to pharmacy 

## 2015-05-08 NOTE — Telephone Encounter (Signed)
Patient requesting a script for losartan-hydrochlorothiazide (HYZAAR) 100-12.5 MG tablet [Pharmacy Med Name: LOSARTAN-HYDROCHLOROTHIAZIDE 100-12.5MG  TABLET] [facility]. [facility] request has been getting sent to [facility], rather than our office.

## 2015-05-31 ENCOUNTER — Ambulatory Visit: Payer: PRIVATE HEALTH INSURANCE | Admitting: Internal Medicine

## 2015-06-11 ENCOUNTER — Encounter: Payer: Self-pay | Admitting: Internal Medicine

## 2015-06-11 ENCOUNTER — Ambulatory Visit (INDEPENDENT_AMBULATORY_CARE_PROVIDER_SITE_OTHER): Payer: PRIVATE HEALTH INSURANCE | Admitting: Internal Medicine

## 2015-06-11 VITALS — BP 120/80 | HR 95 | Temp 98.4°F | Resp 18 | Ht 64.0 in | Wt 365.0 lb

## 2015-06-11 DIAGNOSIS — I10 Essential (primary) hypertension: Secondary | ICD-10-CM

## 2015-06-11 DIAGNOSIS — Z6841 Body Mass Index (BMI) 40.0 and over, adult: Secondary | ICD-10-CM

## 2015-06-11 NOTE — Progress Notes (Signed)
Pre visit review using our clinic review tool, if applicable. No additional management support is needed unless otherwise documented below in the visit note. 

## 2015-06-11 NOTE — Patient Instructions (Signed)
Keep working on exercise to help with the weight and the energy. For weight loss we recommend about 45 minutes 5 times per week.   See you back in about 6 months. Work on keeping up the good work with the diet changes and no more Pepsi!  Serving Sizes A serving size is a measured amount of food or drink, such as one slice of bread, that has an associated nutrient content. Knowing the serving size of a food or drink can help you determine how much of that food you should consume.  WHAT IS THE SIZE OF ONE SERVING? The size of one healthy serving depends on the food or drink. To determine a serving size, read the food label. If the food or drink does not have a food label, try to find serving size information online. Or, use the following to estimate the size of one adult serving:  Grain 1 slice bread.  bagel.  cup pasta.  Vegetable  cup cooked or canned vegetables. 1 cup raw, leafy greens.  Fruit  cup canned fruit. 1 medium fruit.  cup dried fruit.  Meat and Other Protein Sources 1 oz meat, poultry, or fish.  cup cooked beans. 1 egg.  cup nuts or seeds. 1 Tbsp nut butter.  cup tofu or tempeh. 2 Tbsp hummus.  Dairy An individual container of yogurt (6-8 oz). 1 piece of cheese the size of your thumb (1 oz). 1 cup (8 oz) milk or milk alternative.  Fat A piece the size of one dice. 1 tsp soft margarine. 1 Tbsp mayonnaise. 1 tsp vegetable oil. 1 Tbsp regular salad dressing. 2 Tbsp low-fat salad dressing.  HOW MANY SERVINGS SHOULD I EAT FROM EACH FOOD GROUP EACH DAY?  The following are the suggested number of servings to try and have every day from each food group. You can also look at your eating throughout the week and aim for meeting these requirements on most days for overall healthy eating.  Grain 6-8 servings. Try to have half of your grains from whole grains, such as whole wheat bread, corn tortillas, oatmeal, brown rice, whole wheat pasta, and bulgur. Vegetable At least 2-3  servings.  Fruit 2 servings.  Meat and Other Protein Foods 5-6 servings. Aim to have lean proteins, such as chicken, [place], fish, beans, or tofu. Dairy 3 servings. Choose low-fat or nonfat if you are trying to control your weight.  Fat 2-3 servings.  IS A SERVING THE SAME THING AS A PORTION? No. A portion is the actual amount you eat, which may be more than one serving. Knowing the specific serving size of a food and the nutritional information that goes with it can help you make a healthy decision on what size portion to eat.  WHAT ARE SOME TIPS TO HELP ME LEARN HEALTHY SERVING SIZES?  Check food labels for serving sizes. Many foods that come as a single portion actually contain multiple servings.  Determine the serving size of foods you commonly eat and figure out how large a portion you usually eat.  Measure the number of servings that can be held by the bowls, glasses, cups, and plates you typically use. For example, pour your breakfast cereal into your regular bowl and then pour it into a measuring cup.  For 1-2 days, measure the serving sizes of all the foods you eat.  Practice estimating serving sizes and determining how big your portions should be.   This information is not intended to replace advice given to  you by your health care provider. Make sure you discuss any questions you have with your health care provider.   Document Released: 03/29/2003 Document Revised: 07/21/2014 Document Reviewed: 09/27/2013 Elsevier Interactive Patient Education [employer].

## 2015-06-12 NOTE — Assessment & Plan Note (Signed)
BP well controlled on losartan/hctz and labs done at last visit so will not repeat today.

## 2015-06-12 NOTE — Assessment & Plan Note (Signed)
She is up in weight since last visit and talked to her again about exercise and working on portions at home for meals. She has given up pepsi which is a good step forward.

## 2015-06-12 NOTE — Progress Notes (Signed)
   Subjective:    Patient ID: Emily Phelps, female    DOB: 26-Aug-1980, 34 y.o.   MRN: [id number]  HPI The patient is a 34 YO female coming in for follow up of her uterine bleeding. Has seen gyn in the meantime and the birth control is doing a good job to stop her bleeding. She is also following up on her blood pressure. It is well controlled at home on her medication. She is not exercising and her weight is up since last visit. Her eating is able the same although more sodium intake the last several days with Thanksgiving. No new concerns or complaints. Labs from last visit reviewed with her.   Review of Systems  Constitutional: Negative for fever, activity change, appetite change, fatigue and unexpected weight change.  Respiratory: Negative for cough, chest tightness, shortness of breath and wheezing.   Cardiovascular: Negative for chest pain, palpitations and leg swelling.  Gastrointestinal: Negative for abdominal pain, diarrhea, constipation and abdominal distention.  Genitourinary: Negative for vaginal bleeding, vaginal discharge, vaginal pain, menstrual problem and pelvic pain.  Musculoskeletal: Positive for arthralgias. Negative for joint swelling.  Neurological: Negative.   Psychiatric/Behavioral: Negative.       Objective:   Physical Exam  Constitutional: She is oriented to person, place, and time. She appears well-developed and well-nourished.  Morbidly obese  HENT:  Head: Normocephalic and atraumatic.  Eyes: EOM are normal.  Neck: Normal range of motion.  Cardiovascular: Normal rate and regular rhythm.   Pulmonary/Chest: Effort normal. No respiratory distress. She has no wheezes. She has no rales.  Abdominal: Soft. She exhibits no distension. There is no tenderness.  Musculoskeletal: She exhibits no edema.  Neurological: She is alert and oriented to person, place, and time. Coordination normal.  Skin: Skin is warm and dry.  Psychiatric: She has a normal mood and affect.    Filed Vitals:   06/11/15 1620  BP: 120/80  Pulse: 95  Temp: 98.4 F (36.9 C)  TempSrc: Oral  Resp: 18  Height: 5\' 4"  (1.626 m)  Weight: 365 lb (165.563 kg)  SpO2: 98%      Assessment & Plan:

## 2015-07-27 ENCOUNTER — Other Ambulatory Visit: Payer: Self-pay | Admitting: Internal Medicine

## 2015-09-03 ENCOUNTER — Telehealth: Payer: Self-pay

## 2015-09-03 ENCOUNTER — Other Ambulatory Visit: Payer: Self-pay | Admitting: Internal Medicine

## 2015-09-03 NOTE — Telephone Encounter (Signed)
Medical Accommodation form completed, signed, faxed [phone]), and copy left in cabinet for pt pick up. Pt advised via personal VM

## 2015-09-21 ENCOUNTER — Telehealth: Payer: Self-pay | Admitting: Internal Medicine

## 2015-09-21 ENCOUNTER — Encounter: Payer: Self-pay | Admitting: Family Medicine

## 2015-09-21 ENCOUNTER — Ambulatory Visit (INDEPENDENT_AMBULATORY_CARE_PROVIDER_SITE_OTHER): Payer: PRIVATE HEALTH INSURANCE | Admitting: Family Medicine

## 2015-09-21 ENCOUNTER — Other Ambulatory Visit: Payer: Self-pay | Admitting: Family Medicine

## 2015-09-21 VITALS — HR 136 | Temp 102.5°F | Ht 64.0 in | Wt 355.0 lb

## 2015-09-21 DIAGNOSIS — K047 Periapical abscess without sinus: Secondary | ICD-10-CM | POA: Diagnosis not present

## 2015-09-21 MED ORDER — INDOMETHACIN 50 MG PO CAPS: 50.0000 mg | ORAL_CAPSULE | Freq: Three times a day (TID) | ORAL | Status: DC

## 2015-09-21 MED ORDER — AMOXICILLIN-POT CLAVULANATE 875-125 MG PO TABS: 1.0000 | ORAL_TABLET | Freq: Two times a day (BID) | ORAL | Status: DC

## 2015-09-21 NOTE — Telephone Encounter (Signed)
Pt has an appt at [facility]

## 2015-09-21 NOTE — Telephone Encounter (Signed)
[place] [facility] - Client [facility]  Patient Name: Emily Phelps  DOB: 1981-01-31    Initial Comment Caller states she is having a bad tooth ache- left side hurts and face feels like it is swelling   Nurse Assessment  Nurse: Wynetta Emery, RN, Baker Janus Date/Time (Eastern Time): 09/21/2015 10:01:45 AM  Confirm and document reason for call. If symptomatic, describe symptoms. You must click the next button to save text entered. ---[name] started with toothache yesterday and facial swelling. in pain  Has the patient traveled out of the country within the last 30 days? ---No  Does the patient have any new or worsening symptoms? ---Yes  Will a triage be completed? ---Yes  Related visit to physician within the last 2 weeks? ---No  Does the PT have any chronic conditions? (i.e. diabetes, asthma, etc.) ---No  Is the patient pregnant or possibly pregnant? (Ask all females between the ages of 54-55) ---No  Is this a behavioral health or substance abuse call? ---No     Guidelines    Guideline Title Affirmed Question Affirmed Notes  Toothache Face is very swollen    Final Disposition User   See Physician within 4 Hours (or PCP triage) Wynetta Emery, RN, Baker Janus    Comments  NO AVAILABLE APPTS IN [place] OFFICE HAS APPT IN [place] OFFICE AT 300PM WITH ARRIVAL Rutherford, MD   Referrals  REFERRED TO PCP OFFICE   Disagree/Comply: Comply

## 2015-09-21 NOTE — Progress Notes (Signed)
Pre visit review using our clinic review tool, if applicable. No additional management support is needed unless otherwise documented below in the visit note. 

## 2015-09-21 NOTE — Telephone Encounter (Signed)
[facility] [facility] - Client [facility]  Patient Name: Emily Phelps  DOB: March 29, 1981    Initial Comment Caller states she is having a bad tooth ache- left side hurts and face feels like it is swelling   Nurse Assessment  Nurse: Wynetta Emery, RN, Baker Janus Date/Time (Eastern Time): 09/21/2015 10:01:45 AM  Confirm and document reason for call. If symptomatic, describe symptoms. You must click the next button to save text entered. ---[name] started with toothache yesterday and facial swelling. in pain  Has the patient traveled out of the country within the last 30 days? ---No  Does the patient have any new or worsening symptoms? ---Yes  Will a triage be completed? ---Yes  Related visit to physician within the last 2 weeks? ---No  Does the PT have any chronic conditions? (i.e. diabetes, asthma, etc.) ---No  Is the patient pregnant or possibly pregnant? (Ask all females between the ages of 24-55) ---No  Is this a behavioral health or substance abuse call? ---No     Guidelines    Guideline Title Affirmed Question Affirmed Notes  Toothache Face is very swollen    Final Disposition User   See Physician within 4 Hours (or PCP triage) Wynetta Emery, RN, Baker Janus    Referrals  REFERRED TO PCP OFFICE   Disagree/Comply: Leta Baptist

## 2015-09-21 NOTE — Progress Notes (Signed)
   Subjective:    Patient ID: Emily Phelps, female    DOB: [birth year], 35 y.o.   MRN: [id number]  HPI Here with one week of pain in the left lower jaw and now with 2 days of swelling in this area and fever. She has pain on chewing. Taking Ibuprofen. She has not seen her dentist for several years.    Review of Systems  Constitutional: Positive for fever.  HENT: Positive for dental problem and facial swelling. Negative for ear pain, sinus pressure and sore throat.   Eyes: Negative.   Respiratory: Negative.        Objective:   Physical Exam  Constitutional: She appears well-developed and well-nourished.  HENT:  Right Ear: External ear normal.  Left Ear: External ear normal.  Nose: Nose normal.  The left lower jaw area is mildly swollen and tender. The left lower jaw has a diseased molar with tender gum around it  Eyes: Conjunctivae are normal.  Neck: Neck supple. No thyromegaly present.  Pulmonary/Chest: Effort normal and breath sounds normal.  Lymphadenopathy:    She has no cervical adenopathy.          Assessment & Plan:  Abscessed tooth. Use Augmentin for infection and Indocin for pain. Use warm compresses. She will contact her dentist early next week.

## 2015-10-26 ENCOUNTER — Other Ambulatory Visit: Payer: Self-pay | Admitting: Internal Medicine

## 2015-11-30 ENCOUNTER — Other Ambulatory Visit: Payer: Self-pay | Admitting: Internal Medicine

## 2015-12-14 ENCOUNTER — Ambulatory Visit: Payer: PRIVATE HEALTH INSURANCE | Admitting: Internal Medicine

## 2015-12-28 ENCOUNTER — Encounter: Payer: Self-pay | Admitting: Internal Medicine

## 2015-12-28 ENCOUNTER — Ambulatory Visit (INDEPENDENT_AMBULATORY_CARE_PROVIDER_SITE_OTHER): Payer: PRIVATE HEALTH INSURANCE | Admitting: Internal Medicine

## 2015-12-28 ENCOUNTER — Other Ambulatory Visit (INDEPENDENT_AMBULATORY_CARE_PROVIDER_SITE_OTHER): Payer: PRIVATE HEALTH INSURANCE

## 2015-12-28 VITALS — BP 136/78 | HR 108 | Temp 98.5°F | Resp 12 | Ht 64.0 in | Wt 359.1 lb

## 2015-12-28 DIAGNOSIS — I10 Essential (primary) hypertension: Secondary | ICD-10-CM

## 2015-12-28 DIAGNOSIS — Z0000 Encounter for general adult medical examination without abnormal findings: Secondary | ICD-10-CM

## 2015-12-28 DIAGNOSIS — Z6841 Body Mass Index (BMI) 40.0 and over, adult: Secondary | ICD-10-CM | POA: Diagnosis not present

## 2015-12-28 LAB — COMPREHENSIVE METABOLIC PANEL
ALBUMIN: 4.1 g/dL (ref 3.5–5.2)
ALK PHOS: 50 U/L (ref 39–117)
ALT: 15 U/L (ref 0–35)
AST: 11 U/L (ref 0–37)
BILIRUBIN TOTAL: 0.4 mg/dL (ref 0.2–1.2)
BUN: 12 mg/dL (ref 6–23)
CO2: 26 mEq/L (ref 19–32)
Calcium: 10.3 mg/dL (ref 8.4–10.5)
Chloride: 104 mEq/L (ref 96–112)
Creatinine, Ser: 0.92 mg/dL (ref 0.40–1.20)
GFR: 90.69 mL/min (ref >60.00)
GLUCOSE: 130 mg/dL — AB (ref 70–99)
Potassium: 3.6 mEq/L (ref 3.5–5.1)
SODIUM: 138 meq/L (ref 135–145)
TOTAL PROTEIN: 7.7 g/dL (ref 6.0–8.3)

## 2015-12-28 LAB — CBC
HCT: 37.5 % (ref 36.0–46.0)
HEMOGLOBIN: 12.6 g/dL (ref 12.0–15.0)
MCHC: 33.6 g/dL (ref 30.0–36.0)
MCV: 75.4 fl — ABNORMAL LOW (ref 78.0–100.0)
PLATELETS: 519 10*3/uL — AB (ref 150.0–400.0)
RBC: 4.98 Mil/uL (ref 3.87–5.11)
RDW: 17.6 % — ABNORMAL HIGH (ref 11.5–15.5)
WBC: 13.6 10*3/uL — ABNORMAL HIGH (ref 4.0–10.5)

## 2015-12-28 LAB — HEMOGLOBIN A1C: HEMOGLOBIN A1C: 5 % (ref 4.6–6.5)

## 2015-12-28 LAB — TROPONIN I: TNIDX: 0.01 ug/l (ref 0.00–0.06)

## 2015-12-28 LAB — LIPID PANEL
CHOL/HDL RATIO: 3
Cholesterol: 148 mg/dL (ref 0–200)
HDL: 43.3 mg/dL (ref >39.00)
NONHDL: 104.66
Triglycerides: 253 mg/dL — ABNORMAL HIGH (ref 0.0–149.0)
VLDL: 50.6 mg/dL — ABNORMAL HIGH (ref 0.0–40.0)

## 2015-12-28 LAB — LDL CHOLESTEROL, DIRECT: Direct LDL: 76 mg/dL

## 2015-12-28 MED ORDER — FEXOFENADINE HCL 180 MG PO TABS: 180.0000 mg | ORAL_TABLET | Freq: Every day | ORAL | Status: DC

## 2015-12-28 NOTE — Progress Notes (Signed)
Pre visit review using our clinic review tool, if applicable. No additional management support is needed unless otherwise documented below in the visit note. 

## 2015-12-28 NOTE — Patient Instructions (Signed)
We will check the labs today to make sure the pains in the chest are not coming from the heart.   Keep up the good work with the diet changes with portions! I know it's hard but you are doing good.

## 2015-12-30 NOTE — Assessment & Plan Note (Signed)
Weight is not coming down. She declines nutrition consult today. Working on exercising more and carb control and snacking control.

## 2015-12-30 NOTE — Progress Notes (Signed)
   Subjective:    Patient ID: Emily Phelps, female    DOB: [birth year], 35 y.o.   MRN: [id number]  HPI The patient is a 35 YO female coming in for follow up of her weight. She is up some weight from last visit. She has been working on her diet and mild amounts of exercise. She denies any new complaints. She is still taking her blood pressure medicine and no side effects.   Review of Systems  Constitutional: Negative for fever, activity change, appetite change, fatigue and unexpected weight change.  Respiratory: Negative for cough, chest tightness, shortness of breath and wheezing.   Cardiovascular: Negative for chest pain, palpitations and leg swelling.  Gastrointestinal: Negative for abdominal pain, diarrhea, constipation and abdominal distention.  Genitourinary: Negative for vaginal bleeding, vaginal discharge, vaginal pain, menstrual problem and pelvic pain.  Musculoskeletal: Positive for arthralgias. Negative for joint swelling.  Neurological: Negative.   Psychiatric/Behavioral: Negative.       Objective:   Physical Exam  Constitutional: She is oriented to person, place, and time. She appears well-developed and well-nourished.  Morbidly obese  HENT:  Head: Normocephalic and atraumatic.  Eyes: EOM are normal.  Neck: Normal range of motion.  Cardiovascular: Normal rate and regular rhythm.   Pulmonary/Chest: Effort normal. No respiratory distress. She has no wheezes. She has no rales.  Abdominal: Soft. She exhibits no distension. There is no tenderness.  Musculoskeletal: She exhibits no edema.  Neurological: She is alert and oriented to person, place, and time. Coordination normal.  Skin: Skin is warm and dry.  Psychiatric: She has a normal mood and affect.   Filed Vitals:   12/28/15 1442  BP: 136/78  Pulse: 108  Temp: 98.5 F (36.9 C)  TempSrc: Oral  Resp: 12  Height: 5\' 4"  (1.626 m)  Weight: 359 lb 1.9 oz (162.896 kg)  SpO2: 98%      Assessment & Plan:

## 2015-12-30 NOTE — Assessment & Plan Note (Signed)
BP at goal on her losartan hctz and she knows that her weight is likely part of the reason she is having high blood pressure.

## 2016-02-23 ENCOUNTER — Other Ambulatory Visit: Payer: Self-pay | Admitting: Internal Medicine

## 2016-04-05 ENCOUNTER — Other Ambulatory Visit: Payer: Self-pay | Admitting: Internal Medicine

## 2016-04-07 ENCOUNTER — Other Ambulatory Visit: Payer: Self-pay | Admitting: Internal Medicine

## 2016-05-10 ENCOUNTER — Other Ambulatory Visit: Payer: Self-pay | Admitting: Internal Medicine

## 2016-06-06 ENCOUNTER — Other Ambulatory Visit: Payer: Self-pay | Admitting: Internal Medicine

## 2016-08-22 ENCOUNTER — Other Ambulatory Visit: Payer: Self-pay | Admitting: Internal Medicine

## 2016-09-20 ENCOUNTER — Other Ambulatory Visit: Payer: Self-pay | Admitting: Internal Medicine

## 2016-10-14 ENCOUNTER — Encounter (HOSPITAL_COMMUNITY): Payer: Self-pay

## 2016-10-14 ENCOUNTER — Emergency Department (HOSPITAL_COMMUNITY): Payer: PRIVATE HEALTH INSURANCE

## 2016-10-14 ENCOUNTER — Emergency Department (HOSPITAL_COMMUNITY)
Admission: EM | Admit: 2016-10-14 | Discharge: 2016-10-15 | Disposition: A | Discharge status: Home / Self Care | Payer: PRIVATE HEALTH INSURANCE | Attending: Emergency Medicine | Admitting: Emergency Medicine

## 2016-10-14 DIAGNOSIS — M546 Pain in thoracic spine: Secondary | ICD-10-CM | POA: Insufficient documentation

## 2016-10-14 DIAGNOSIS — Y92410 Unspecified street and highway as the place of occurrence of the external cause: Secondary | ICD-10-CM | POA: Insufficient documentation

## 2016-10-14 DIAGNOSIS — Y939 Activity, unspecified: Secondary | ICD-10-CM | POA: Diagnosis not present

## 2016-10-14 DIAGNOSIS — Y999 Unspecified external cause status: Secondary | ICD-10-CM | POA: Diagnosis not present

## 2016-10-14 DIAGNOSIS — R030 Elevated blood-pressure reading, without diagnosis of hypertension: Secondary | ICD-10-CM

## 2016-10-14 DIAGNOSIS — Z79899 Other long term (current) drug therapy: Secondary | ICD-10-CM | POA: Diagnosis not present

## 2016-10-14 DIAGNOSIS — M545 Low back pain, unspecified: Secondary | ICD-10-CM

## 2016-10-14 DIAGNOSIS — I10 Essential (primary) hypertension: Secondary | ICD-10-CM | POA: Insufficient documentation

## 2016-10-14 MED ORDER — IBUPROFEN 800 MG PO TABS
800.0000 mg | ORAL_TABLET | Freq: Once | ORAL | Status: AC
  Administered 2016-10-15: 800 mg via ORAL
  Filled 2016-10-14: qty 1
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
## 2016-10-15 ENCOUNTER — Other Ambulatory Visit: Payer: Self-pay | Admitting: Internal Medicine

## 2016-10-15 ENCOUNTER — Emergency Department (HOSPITAL_COMMUNITY): Payer: PRIVATE HEALTH INSURANCE

## 2016-10-15 LAB — POC URINE PREG, ED: PREG TEST UR: NEGATIVE

## 2016-10-15 MED ORDER — IBUPROFEN 800 MG PO TABS: 800.0000 mg | ORAL_TABLET | Freq: Three times a day (TID) | ORAL | 0 refills | Status: DC

## 2016-10-15 MED ORDER — METHOCARBAMOL 500 MG PO TABS: 500.0000 mg | ORAL_TABLET | Freq: Every evening | ORAL | 0 refills | Status: DC | PRN
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
## 2016-10-16 ENCOUNTER — Ambulatory Visit (INDEPENDENT_AMBULATORY_CARE_PROVIDER_SITE_OTHER): Payer: PRIVATE HEALTH INSURANCE | Admitting: Nurse Practitioner

## 2016-10-16 ENCOUNTER — Encounter: Payer: Self-pay | Admitting: Nurse Practitioner

## 2016-10-16 DIAGNOSIS — M542 Cervicalgia: Secondary | ICD-10-CM | POA: Diagnosis not present

## 2016-10-16 DIAGNOSIS — I10 Essential (primary) hypertension: Secondary | ICD-10-CM

## 2016-10-16 MED ORDER — KETOROLAC TROMETHAMINE 30 MG/ML IJ SOLN
30.0000 mg | Freq: Once | INTRAMUSCULAR | Status: AC
  Administered 2016-10-16: 30 mg via INTRAMUSCULAR

## 2016-10-16 MED ORDER — CLONIDINE HCL 0.1 MG PO TABS: 0.1000 mg | ORAL_TABLET | Freq: Once | ORAL | Status: DC

## 2016-10-16 MED ORDER — AMLODIPINE BESYLATE 5 MG PO TABS
5.0000 mg | ORAL_TABLET | Freq: Every day | ORAL | 0 refills | Status: DC
Start: 2016-10-16 — End: 2016-11-13

## 2016-10-16 MED ORDER — KETOROLAC TROMETHAMINE 30 MG/ML IJ SOLN: 30.0000 mg | Freq: Once | INTRAMUSCULAR | Status: DC

## 2016-10-16 MED ORDER — CLONIDINE HCL 0.1 MG PO TABS
0.1000 mg | ORAL_TABLET | Freq: Once | ORAL | Status: AC
  Administered 2016-10-16: 0.2 mg via ORAL

## 2016-10-16 NOTE — Progress Notes (Signed)
Pre visit review using our clinic review tool, if applicable. No additional management support is needed unless otherwise documented below in the visit note. 

## 2016-10-16 NOTE — Patient Instructions (Addendum)
Continue naproxen and robaxin as prescribed.  Check BP at home. If BP >160/100 by 7pm this evening, take amlodipine 1tab. If BP persistently >160/90 tomorrow morning, call office.   Cervical Sprain A cervical sprain is a stretch or tear in one or more of the tough, cord-like tissues that connect bones (ligaments) in the neck. Cervical sprains can range from mild to severe. Severe cervical sprains can cause the spinal bones (vertebrae) in the neck to be unstable. This can lead to spinal cord damage and can result in serious nervous system problems. The amount of time that it takes for a cervical sprain to get better depends on the cause and extent of the injury. Most cervical sprains heal in 4-6 weeks. What are the causes? Cervical sprains may be caused by an injury (trauma), such as from a motor vehicle accident, a fall, or sudden forward and backward whipping movement of the head and neck (whiplash injury). Mild cervical sprains may be caused by wear and tear over time, such as from poor posture, sitting in a chair that does not provide support, or looking up or down for long periods of time. What increases the risk? The following factors may make you more likely to develop this condition:  Participating in activities that have a high risk of trauma to the neck. These include contact sports, auto racing, gymnastics, and diving.  Taking risks when driving or riding in a motor vehicle, such as speeding.  Having osteoarthritis of the spine.  Having poor strength and flexibility of the neck.  A previous neck injury.  Having poor posture.  Spending a lot of time in certain positions that put stress on the neck, such as sitting at a computer for long periods of time. What are the signs or symptoms? Symptoms of this condition include:  Pain, soreness, stiffness, tenderness, swelling, or a burning sensation in the front, back, or sides of the neck.  Sudden tightening of neck muscles that  you cannot control (muscle spasms).  Pain in the shoulders or upper back.  Limited ability to move the neck.  Headache.  Dizziness.  Nausea.  Vomiting.  Weakness, numbness, or tingling in a hand or an arm. Symptoms may develop right away after injury, or they may develop over a few days. In some cases, symptoms may go away with treatment and return (recur) over time. How is this diagnosed? This condition may be diagnosed based on:  Your medical history.  Your symptoms.  Any recent injuries or known neck problems that you have, such as arthritis in the neck.  A physical exam.  Imaging tests, such as:  X-rays.  MRI.  CT scan. How is this treated? This condition is treated by resting and icing the injured area and doing physical therapy exercises. Depending on the severity of your condition, treatment may also include:  Keeping your neck in place (immobilized) for periods of time. This may be done using:  A cervical collar. This supports your chin and the back of your head.  A cervical traction device. This is a sling that holds up your head. This removes weight and pressure from your neck, and it may help to relieve pain.  Medicines that help to relieve pain and inflammation.  Medicines that help to relax your muscles (muscle relaxants).  Surgery. This is rare. Follow these instructions at home: If you have a cervical collar:   Wear it as told by your health care provider. Do not remove the collar unless instructed  by your health care provider.  Ask your health care provider before you make any adjustments to your collar.  If you have long hair, keep it outside of the collar.  Ask your health care provider if you can remove the collar for cleaning and bathing. If you are allowed to remove the collar for cleaning or bathing:  Follow instructions from your health care provider about how to remove the collar safely.  Clean the collar by wiping it with mild soap  and water and drying it completely.  If your collar has removable pads, remove them every 1-2 days and wash them by hand with soap and water. Let them air-dry completely before you put them back in the collar.  Check your skin under the collar for irritation or sores. If you see any, tell your health care provider. Managing pain, stiffness, and swelling   If directed, use a cervical traction device as told by your health care provider.  If directed, apply heat to the affected area before you do your physical therapy or as often as told by your health care provider. Use the heat source that your health care provider recommends, such as a moist heat pack or a heating pad.  Place a towel between your skin and the heat source.  Leave the heat on for 20-30 minutes.  Remove the heat if your skin turns bright red. This is especially important if you are unable to feel pain, heat, or cold. You may have a greater risk of getting burned.  If directed, put ice on the affected area:  Put ice in a plastic bag.  Place a towel between your skin and the bag.  Leave the ice on for 20 minutes, 2-3 times a day. Activity   Do not drive while wearing a cervical collar. If you do not have a cervical collar, ask your health care provider if it is safe to drive while your neck heals.  Do not drive or use heavy machinery while taking prescription pain medicine or muscle relaxants, unless your health care provider approves.  Do not lift anything that is heavier than 10 lb (4.5 kg) until your health care provider tells you that it is safe.  Rest as directed by your health care provider. Avoid positions and activities that make your symptoms worse. Ask your health care provider what activities are safe for you.  If physical therapy was prescribed, do exercises as told by your health care provider or physical therapist. General instructions   Take over-the-counter and prescription medicines only as told by  your health care provider.  Do not use any products that contain nicotine or tobacco, such as cigarettes and e-cigarettes. These can delay healing. If you need help quitting, ask your health care provider.  Keep all follow-up visits as told by your health care provider or physical therapist. This is important. How is this prevented? To prevent a cervical sprain from happening again:  Use and maintain good posture. Make any needed adjustments to your workstation to help you use good posture.  Exercise regularly as directed by your health care provider or physical therapist.  Avoid risky activities that may cause a cervical sprain. Contact a health care provider if:  You have symptoms that get worse or do not get better after 2 weeks of treatment.  You have pain that gets worse or does not get better with medicine.  You develop new, unexplained symptoms.  You have sores or irritated skin on your neck  from wearing your cervical collar. Get help right away if:  You have severe pain.  You develop numbness, tingling, or weakness in any part of your body.  You cannot move a part of your body (you have paralysis).  You have neck pain along with:  Severe dizziness.  Headache. Summary  A cervical sprain is a stretch or tear in one or more of the tough, cord-like tissues that connect bones (ligaments) in the neck.  Cervical sprains may be caused by an injury (trauma), such as from a motor vehicle accident, a fall, or sudden forward and backward whipping movement of the head and neck (whiplash injury).  Symptoms may develop right away after injury, or they may develop over a few days.  This condition is treated by resting and icing the injured area and doing physical therapy exercises. This information is not intended to replace advice given to you by your health care provider. Make sure you discuss any questions you have with your health care provider. Document Released: 04/27/2007  Document Revised: 02/27/2016 Document Reviewed: 02/27/2016 Elsevier Interactive Patient Education  2017 [employer].
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
## 2016-11-13 ENCOUNTER — Encounter: Payer: Self-pay | Admitting: Internal Medicine

## 2016-11-13 ENCOUNTER — Ambulatory Visit: Payer: PRIVATE HEALTH INSURANCE | Admitting: Internal Medicine

## 2016-11-13 ENCOUNTER — Ambulatory Visit (INDEPENDENT_AMBULATORY_CARE_PROVIDER_SITE_OTHER): Payer: PRIVATE HEALTH INSURANCE | Admitting: Internal Medicine

## 2016-11-13 DIAGNOSIS — I10 Essential (primary) hypertension: Secondary | ICD-10-CM | POA: Diagnosis not present

## 2016-11-13 MED ORDER — ATENOLOL 50 MG PO TABS: 50.0000 mg | ORAL_TABLET | Freq: Every day | ORAL | 3 refills | Status: DC

## 2016-11-13 MED ORDER — LOSARTAN POTASSIUM-HCTZ 100-25 MG PO TABS: 1.0000 | ORAL_TABLET | Freq: Every day | ORAL | 3 refills | Status: DC
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
## 2016-11-13 NOTE — Patient Instructions (Signed)
We will temporarily add a second blood pressure medicine called atenolol which can help with blood pressure and also with the feeling of the heart beating too fast.

## 2016-11-13 NOTE — Progress Notes (Signed)
Pre visit review using our clinic review tool, if applicable. No additional management support is needed unless otherwise documented below in the visit note. 

## 2016-11-14 NOTE — Assessment & Plan Note (Signed)
BP moderately elevated and will increase losartan/hctz to 100/25 mg daily. Will temporarily add atenolol 50 mg daily until follow up in 1 month and if BP improved can think about discontinuing. Talked to her about DASH diet to help.

## 2016-12-30 ENCOUNTER — Encounter: Payer: PRIVATE HEALTH INSURANCE | Admitting: Internal Medicine

## 2017-01-08 ENCOUNTER — Encounter: Payer: PRIVATE HEALTH INSURANCE | Admitting: Internal Medicine

## 2017-01-09 ENCOUNTER — Ambulatory Visit (INDEPENDENT_AMBULATORY_CARE_PROVIDER_SITE_OTHER)
Admission: RE | Admit: 2017-01-09 | Discharge: 2017-01-09 | Disposition: A | Discharge status: Home / Self Care | Payer: Self-pay | Source: Ambulatory Visit | Attending: Nurse Practitioner | Admitting: Nurse Practitioner

## 2017-01-09 ENCOUNTER — Encounter: Payer: PRIVATE HEALTH INSURANCE | Admitting: Internal Medicine

## 2017-01-09 ENCOUNTER — Other Ambulatory Visit (INDEPENDENT_AMBULATORY_CARE_PROVIDER_SITE_OTHER): Payer: PRIVATE HEALTH INSURANCE

## 2017-01-09 ENCOUNTER — Encounter: Payer: Self-pay | Admitting: Nurse Practitioner

## 2017-01-09 ENCOUNTER — Ambulatory Visit (INDEPENDENT_AMBULATORY_CARE_PROVIDER_SITE_OTHER): Payer: PRIVATE HEALTH INSURANCE | Admitting: Nurse Practitioner

## 2017-01-09 VITALS — BP 150/118 | HR 70 | Temp 98.1°F | Ht 64.0 in | Wt 392.0 lb

## 2017-01-09 DIAGNOSIS — Z0000 Encounter for general adult medical examination without abnormal findings: Secondary | ICD-10-CM

## 2017-01-09 DIAGNOSIS — I10 Essential (primary) hypertension: Secondary | ICD-10-CM

## 2017-01-09 DIAGNOSIS — R05 Cough: Secondary | ICD-10-CM

## 2017-01-09 DIAGNOSIS — Z6841 Body Mass Index (BMI) 40.0 and over, adult: Secondary | ICD-10-CM

## 2017-01-09 DIAGNOSIS — E782 Mixed hyperlipidemia: Secondary | ICD-10-CM

## 2017-01-09 DIAGNOSIS — Z124 Encounter for screening for malignant neoplasm of cervix: Secondary | ICD-10-CM | POA: Diagnosis not present

## 2017-01-09 DIAGNOSIS — R5383 Other fatigue: Secondary | ICD-10-CM

## 2017-01-09 DIAGNOSIS — R7989 Other specified abnormal findings of blood chemistry: Secondary | ICD-10-CM

## 2017-01-09 DIAGNOSIS — R739 Hyperglycemia, unspecified: Secondary | ICD-10-CM

## 2017-01-09 DIAGNOSIS — R400 Somnolence: Secondary | ICD-10-CM | POA: Diagnosis not present

## 2017-01-09 DIAGNOSIS — R059 Cough, unspecified: Secondary | ICD-10-CM

## 2017-01-09 DIAGNOSIS — Z0001 Encounter for general adult medical examination with abnormal findings: Secondary | ICD-10-CM

## 2017-01-09 DIAGNOSIS — R0602 Shortness of breath: Secondary | ICD-10-CM

## 2017-01-09 DIAGNOSIS — G479 Sleep disorder, unspecified: Secondary | ICD-10-CM

## 2017-01-09 LAB — CBC WITH DIFFERENTIAL/PLATELET
BASOS ABS: 0 10*3/uL (ref 0.0–0.1)
Basophils Relative: 0.3 % (ref 0.0–3.0)
Eosinophils Absolute: 0.2 10*3/uL (ref 0.0–0.7)
Eosinophils Relative: 1.3 % (ref 0.0–5.0)
HCT: 35.3 % — ABNORMAL LOW (ref 36.0–46.0)
HEMOGLOBIN: 11.8 g/dL — AB (ref 12.0–15.0)
LYMPHS ABS: 3.3 10*3/uL (ref 0.7–4.0)
Lymphocytes Relative: 26.4 % (ref 12.0–46.0)
MCHC: 33.3 g/dL (ref 30.0–36.0)
MCV: 77.1 fl — ABNORMAL LOW (ref 78.0–100.0)
MONO ABS: 0.7 10*3/uL (ref 0.1–1.0)
Monocytes Relative: 5.9 % (ref 3.0–12.0)
NEUTROS PCT: 66.1 % (ref 43.0–77.0)
Neutro Abs: 8.3 10*3/uL — ABNORMAL HIGH (ref 1.4–7.7)
Platelets: 434 10*3/uL — ABNORMAL HIGH (ref 150.0–400.0)
RBC: 4.58 Mil/uL (ref 3.87–5.11)
RDW: 17.3 % — ABNORMAL HIGH (ref 11.5–15.5)
WBC: 12.5 10*3/uL — AB (ref 4.0–10.5)

## 2017-01-09 LAB — HEPATIC FUNCTION PANEL
ALBUMIN: 3.8 g/dL (ref 3.5–5.2)
ALK PHOS: 51 U/L (ref 39–117)
ALT: 16 U/L (ref 0–35)
AST: 11 U/L (ref 0–37)
BILIRUBIN DIRECT: 0 mg/dL (ref 0.0–0.3)
TOTAL PROTEIN: 7.1 g/dL (ref 6.0–8.3)
Total Bilirubin: 0.4 mg/dL (ref 0.2–1.2)

## 2017-01-09 LAB — BASIC METABOLIC PANEL
BUN: 8 mg/dL (ref 6–23)
CHLORIDE: 105 meq/L (ref 96–112)
CO2: 28 meq/L (ref 19–32)
Calcium: 10.3 mg/dL (ref 8.4–10.5)
Creatinine, Ser: 0.85 mg/dL (ref 0.40–1.20)
GFR: 98.73 mL/min (ref >60.00)
GLUCOSE: 105 mg/dL — AB (ref 70–99)
POTASSIUM: 3.8 meq/L (ref 3.5–5.1)
SODIUM: 140 meq/L (ref 135–145)

## 2017-01-09 LAB — TSH: TSH: 4.21 u[IU]/mL (ref 0.35–4.50)

## 2017-01-09 LAB — LDL CHOLESTEROL, DIRECT: LDL DIRECT: 65 mg/dL

## 2017-01-09 LAB — LIPID PANEL
Cholesterol: 140 mg/dL (ref 0–200)
HDL: 42.5 mg/dL (ref >39.00)
NONHDL: 97.52
Total CHOL/HDL Ratio: 3
Triglycerides: 205 mg/dL — ABNORMAL HIGH (ref 0.0–149.0)
VLDL: 41 mg/dL — ABNORMAL HIGH (ref 0.0–40.0)

## 2017-01-09 LAB — HIV ANTIBODY (ROUTINE TESTING W REFLEX): HIV: NONREACTIVE

## 2017-01-09 LAB — HEMOGLOBIN A1C: HEMOGLOBIN A1C: 5.2 % (ref 4.6–6.5)

## 2017-01-09 MED ORDER — GUAIFENESIN-DM 100-10 MG/5ML PO SYRP: 5.0000 mL | ORAL_SOLUTION | ORAL | 0 refills | Status: DC | PRN

## 2017-01-09 NOTE — Patient Instructions (Addendum)
Go to basement for blood draw and CXR. You will be contacted with results.  I will cancel BP medication due to cough, if lab and CXR are normal.  You will be contacted to schedule appt with GYN and neurology.  Let me know if you will like to use Saxenda for weight loss.  Calorie Counting for Weight Loss Calories are units of energy. Your body needs a certain amount of calories from food to keep you going throughout the day. When you eat more calories than your body needs, your body stores the extra calories as fat. When you eat fewer calories than your body needs, your body burns fat to get the energy it needs. Calorie counting means keeping track of how many calories you eat and drink each day. Calorie counting can be helpful if you need to lose weight. If you make sure to eat fewer calories than your body needs, you should lose weight. Ask your health care provider what a healthy weight is for you. For calorie counting to work, you will need to eat the right number of calories in a day in order to lose a healthy amount of weight per week. A dietitian can help you determine how many calories you need in a day and will give you suggestions on how to reach your calorie goal.  A healthy amount of weight to lose per week is usually 1-2 lb (0.5-0.9 kg). This usually means that your daily calorie intake should be reduced by 500-750 calories.  Eating 1,200 - 1,500 calories per day can help most women lose weight.  Eating 1,500 - 1,800 calories per day can help most men lose weight.  What is my plan? My goal is to have __________ calories per day. If I have this many calories per day, I should lose around __________ pounds per week. What do I need to know about calorie counting? In order to meet your daily calorie goal, you will need to:  Find out how many calories are in each food you would like to eat. Try to do this before you eat.  Decide how much of the food you plan to eat.  Write down  what you ate and how many calories it had. Doing this is called keeping a food log.  To successfully lose weight, it is important to balance calorie counting with a healthy lifestyle that includes regular activity. Aim for 150 minutes of moderate exercise (such as walking) or 75 minutes of vigorous exercise (such as running) each week. Where do I find calorie information?  The number of calories in a food can be found on a Nutrition Facts label. If a food does not have a Nutrition Facts label, try to look up the calories online or ask your dietitian for help. Remember that calories are listed per serving. If you choose to have more than one serving of a food, you will have to multiply the calories per serving by the amount of servings you plan to eat. For example, the label on a package of bread might say that a serving size is 1 slice and that there are 90 calories in a serving. If you eat 1 slice, you will have eaten 90 calories. If you eat 2 slices, you will have eaten 180 calories. How do I keep a food log? Immediately after each meal, record the following information in your food log:  What you ate. Don't forget to include toppings, sauces, and other extras on the food.  How  much you ate. This can be measured in cups, ounces, or number of items.  How many calories each food and drink had.  The total number of calories in the meal.  Keep your food log near you, such as in a small notebook in your pocket, or use a mobile app or website. Some programs will calculate calories for you and show you how many calories you have left for the day to meet your goal. What are some calorie counting tips?  Use your calories on foods and drinks that will fill you up and not leave you hungry: ? Some examples of foods that fill you up are nuts and nut butters, vegetables, lean proteins, and high-fiber foods like whole grains. High-fiber foods are foods with more than 5 g fiber per serving. ? Drinks such as  sodas, specialty coffee drinks, alcohol, and juices have a lot of calories, yet do not fill you up.  Eat nutritious foods and avoid empty calories. Empty calories are calories you get from foods or beverages that do not have many vitamins or protein, such as candy, sweets, and soda. It is better to have a nutritious high-calorie food (such as an avocado) than a food with few nutrients (such as a bag of chips).  Know how many calories are in the foods you eat most often. This will help you calculate calorie counts faster.  Pay attention to calories in drinks. Low-calorie drinks include water and unsweetened drinks.  Pay attention to nutrition labels for "low fat" or "fat free" foods. These foods sometimes have the same amount of calories or more calories than the full fat versions. They also often have added sugar, starch, or salt, to make up for flavor that was removed with the fat.  Find a way of tracking calories that works for you. Get creative. Try different apps or programs if writing down calories does not work for you. What are some portion control tips?  Know how many calories are in a serving. This will help you know how many servings of a certain food you can have.  Use a measuring cup to measure serving sizes. You could also try weighing out portions on a kitchen scale. With time, you will be able to estimate serving sizes for some foods.  Take some time to put servings of different foods on your favorite plates, bowls, and cups so you know what a serving looks like.  Try not to eat straight from a bag or box. Doing this can lead to overeating. Put the amount you would like to eat in a cup or on a plate to make sure you are eating the right portion.  Use smaller plates, glasses, and bowls to prevent overeating.  Try not to multitask (for example, watch TV or use your computer) while eating. If it is time to eat, sit down at a table and enjoy your food. This will help you to know  when you are full. It will also help you to be aware of what you are eating and how much you are eating. What are tips for following this plan? Reading food labels  Check the calorie count compared to the serving size. The serving size may be smaller than what you are used to eating.  Check the source of the calories. Make sure the food you are eating is high in vitamins and protein and low in saturated and trans fats. Shopping  Read nutrition labels while you shop. This will help you  make healthy decisions before you decide to purchase your food.  Make a grocery list and stick to it. Cooking  Try to cook your favorite foods in a healthier way. For example, try baking instead of frying.  Use low-fat dairy products. Meal planning  Use more fruits and vegetables. Half of your plate should be fruits and vegetables.  Include lean proteins like poultry and fish. How do I count calories when eating out?  Ask for smaller portion sizes.  Consider sharing an entree and sides instead of getting your own entree.  If you get your own entree, eat only half. Ask for a box at the beginning of your meal and put the rest of your entree in it so you are not tempted to eat it.  If calories are listed on the menu, choose the lower calorie options.  Choose dishes that include vegetables, fruits, whole grains, low-fat dairy products, and lean protein.  Choose items that are boiled, broiled, grilled, or steamed. Stay away from items that are buttered, battered, fried, or served with cream sauce. Items labeled "crispy" are usually fried, unless stated otherwise.  Choose water, low-fat milk, unsweetened iced tea, or other drinks without added sugar. If you want an alcoholic beverage, choose a lower calorie option such as a glass of wine or light beer.  Ask for dressings, sauces, and syrups on the side. These are usually high in calories, so you should limit the amount you eat.  If you want a salad,  choose a garden salad and ask for grilled meats. Avoid extra toppings like bacon, cheese, or fried items. Ask for the dressing on the side, or ask for olive oil and vinegar or lemon to use as dressing.  Estimate how many servings of a food you are given. For example, a serving of cooked rice is  cup or about the size of half a baseball. Knowing serving sizes will help you be aware of how much food you are eating at restaurants. The list below tells you how big or small some common portion sizes are based on everyday objects: ? 1 oz-4 stacked dice. ? 3 oz-1 deck of cards. ? 1 tsp-1 die. ? 1 Tbsp- a ping-pong ball. ? 2 Tbsp-1 ping-pong ball. ?  cup- baseball. ? 1 cup-1 baseball. Summary  Calorie counting means keeping track of how many calories you eat and drink each day. If you eat fewer calories than your body needs, you should lose weight.  A healthy amount of weight to lose per week is usually 1-2 lb (0.5-0.9 kg). This usually means reducing your daily calorie intake by 500-750 calories.  The number of calories in a food can be found on a Nutrition Facts label. If a food does not have a Nutrition Facts label, try to look up the calories online or ask your dietitian for help.  Use your calories on foods and drinks that will fill you up, and not on foods and drinks that will leave you hungry.  Use smaller plates, glasses, and bowls to prevent overeating. This information is not intended to replace advice given to you by your health care provider. Make sure you discuss any questions you have with your health care provider. Document Released: 06/30/2005 Document Revised: 05/30/2016 Document Reviewed: 05/30/2016 Elsevier Interactive Patient Education  2017 [place].   Liraglutide injection (Weight Management) What is this medicine? LIRAGLUTIDE (LIR a GLOO tide) is used with a reduced calorie diet and exercise to help you lose weight. This  medicine may be used for other purposes;  ask your health care provider or pharmacist if you have questions. COMMON BRAND NAME(S): Saxenda What should I tell my health care provider before I take this medicine? They need to know if you have any of these conditions: -endocrine tumors (MEN 2) or if someone in your family had these tumors -gallbladder disease -high cholesterol -history of alcohol abuse problem -history of pancreatitis -kidney disease or if you are on dialysis -liver disease -previous swelling of the tongue, face, or lips with difficulty breathing, difficulty swallowing, hoarseness, or tightening of the throat -stomach problems -suicidal thoughts, plans, or attempt; a previous suicide attempt by you or a family member -thyroid cancer or if someone in your family had thyroid cancer -an unusual or allergic reaction to liraglutide, other medicines, foods, dyes, or preservatives -pregnant or trying to get pregnant -breast-feeding How should I use this medicine? This medicine is for injection under the skin of your upper leg, stomach area, or upper arm. You will be taught how to prepare and give this medicine. Use exactly as directed. Take your medicine at regular intervals. Do not take it more often than directed. It is important that you put your used needles and syringes in a special sharps container. Do not put them in a trash can. If you do not have a sharps container, call your pharmacist or healthcare provider to get one. A special MedGuide will be given to you by the pharmacist with each prescription and refill. Be sure to read this information carefully each time. Talk to your pediatrician regarding the use of this medicine in children. Special care may be needed. Overdosage: If you think you have taken too much of this medicine contact a poison control center or emergency room at once. NOTE: This medicine is only for you. Do not share this medicine with others. What if I miss a dose? If you miss a dose, take it as  soon as you can. If it is almost time for your next dose, take only that dose. Do not take double or extra doses. If you miss your dose for 3 days or more, call your doctor or health care professional to talk about how to restart this medicine. What may interact with this medicine? -insulin and other medicines for diabetes This list may not describe all possible interactions. Give your health care provider a list of all the medicines, herbs, non-prescription drugs, or dietary supplements you use. Also tell them if you smoke, drink alcohol, or use illegal drugs. Some items may interact with your medicine. What should I watch for while using this medicine? Visit your doctor or health care professional for regular checks on your progress. This medicine is intended to be used in addition to a healthy diet and appropriate exercise. The best results are achieved this way. Do not increase or in any way change your dose without consulting your doctor or health care professional. Drink plenty of fluids while taking this medicine. Check with your doctor or health care professional if you get an attack of severe diarrhea, nausea, and vomiting. The loss of too much body fluid can make it dangerous for you to take this medicine. This medicine may affect blood sugar levels. If you have diabetes, check with your doctor or health care professional before you change your diet or the dose of your diabetic medicine. Patients and their families should watch out for worsening depression or thoughts of suicide. Also watch out for sudden changes  in feelings such as feeling anxious, agitated, panicky, irritable, hostile, aggressive, impulsive, severely restless, overly excited and hyperactive, or not being able to sleep. If this happens, especially at the beginning of treatment or after a change in dose, call your health care professional. What side effects may I notice from receiving this medicine? Side effects that you should  report to your doctor or health care professional as soon as possible: -allergic reactions like skin rash, itching or hives, swelling of the face, lips, or tongue -breathing problems -diarrhea that continues or is severe -lump or swelling on the neck -severe nausea -signs and symptoms of infection like fever or chills; cough; sore throat; pain or trouble passing urine -signs and symptoms of low blood sugar such as feeling anxious, confusion, dizziness, increased hunger, unusually weak or tired, sweating, shakiness, cold, irritable, headache, blurred vision, fast heartbeat, loss of consciousness -signs and symptoms of kidney injury like trouble passing urine or change in the amount of urine -trouble swallowing -unusual stomach upset or pain -vomiting Side effects that usually do not require medical attention (report to your doctor or health care professional if they continue or are bothersome): -constipation -decreased appetite -diarrhea -fatigue -headache -nausea -pain, redness, or irritation at site where injected -stomach upset -stuffy or runny nose This list may not describe all possible side effects. Call your doctor for medical advice about side effects. You may report side effects to FDA at 1-800-FDA-1088. Where should I keep my medicine? Keep out of the reach of children. Store unopened pen in a refrigerator between 2 and 8 degrees C (36 and 46 degrees F). Do not freeze or use if the medicine has been frozen. Protect from light and excessive heat. After you first use the pen, it can be stored at room temperature between 15 and 30 degrees C (59 and 86 degrees F) or in a refrigerator. Throw away your used pen after 30 days or after the expiration date, whichever comes first. Do not store your pen with the needle attached. If the needle is left on, medicine may leak from the pen. NOTE: This sheet is a summary. It may not cover all possible information. If you have questions about this  medicine, talk to your doctor, pharmacist, or health care provider.  2018 Elsevier/Gold Standard (2016-07-17 14:41:37)   Breast Self-Awareness Breast self-awareness means:  Knowing how your breasts look.  Knowing how your breasts feel.  Checking your breasts every month for changes.  Telling your doctor if you notice a change in your breasts.  Breast self-awareness allows you to notice a breast problem early while it is still small. How to do a breast self-exam One way to learn what is normal for your breasts and to check for changes is to do a breast self-exam. To do a breast self-exam: Look for Changes  1. Take off all the clothes above your waist. 2. Stand in front of a mirror in a room with good lighting. 3. Put your hands on your hips. 4. Push your hands down. 5. Look at your breasts and nipples in the mirror to see if one breast or nipple looks different than the other. Check to see if: ? The shape of one breast is different. ? The size of one breast is different. ? There are wrinkles, dips, and bumps in one breast and not the other. 6. Look at each breast for changes in your skin, such as: ? Redness. ? Scaly areas. 7. Look for changes in your nipples,  such as: ? Liquid around the nipples. ? Bleeding. ? Dimpling. ? Redness. ? A change in where the nipples are. Feel for Changes 1. Lie on your back on the floor. 2. Feel each breast. To do this, follow these steps: ? Pick a breast to feel. ? Put the arm closest to that breast above your head. ? Use your other arm to feel the nipple area of your breast. Feel the area with the pads of your three middle fingers by making small circles with your fingers. For the first circle, press lightly. For the second circle, press harder. For the third circle, press even harder. ? Keep making circles with your fingers at the light, harder, and even harder pressures as you move down your breast. Stop when you feel your ribs. ? Move  your fingers a little toward the center of your body. ? Start making circles with your fingers again, this time going up until you reach your collarbone. ? Keep making up and down circles until you reach your armpit. Remember to keep using the three pressures. ? Feel the other breast in the same way. 3. Sit or stand in the shower or tub. 4. With soapy water on your skin, feel each breast the same way you did in step 2, when you were lying on the floor. Write Down What You Find  After doing the self-exam, write down:  What is normal for each breast.  Any changes you find in each breast.  When you last had your period.  How often should I check my breasts? Check your breasts every month. If you are breastfeeding, the best time to check them is after you feed your baby or after you use a breast pump. If you get periods, the best time to check your breasts is 5-7 days after your period is over. When should I see my doctor? See your doctor if you notice:  A change in shape or size of your breasts or nipples.  A change in the skin of your breast or nipples, such as red or scaly skin.  Unusual fluid coming from your nipples.  A lump or thick area that was not there before.  Pain in your breasts.  Anything that concerns you.  This information is not intended to replace advice given to you by your health care provider. Make sure you discuss any questions you have with your health care provider. Document Released: 12/17/2007 Document Revised: 12/06/2015 Document Reviewed: 05/20/2015 Elsevier Interactive Patient Education  [employer].
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
## 2017-01-23 ENCOUNTER — Other Ambulatory Visit: Payer: Self-pay | Admitting: Internal Medicine

## 2017-02-09 ENCOUNTER — Encounter: Payer: Self-pay | Admitting: Neurology

## 2017-02-09 ENCOUNTER — Ambulatory Visit (INDEPENDENT_AMBULATORY_CARE_PROVIDER_SITE_OTHER): Payer: PRIVATE HEALTH INSURANCE | Admitting: Neurology

## 2017-02-09 VITALS — BP 192/93 | HR 79 | Ht 64.0 in | Wt 396.0 lb

## 2017-02-09 DIAGNOSIS — R51 Headache: Secondary | ICD-10-CM

## 2017-02-09 DIAGNOSIS — G479 Sleep disorder, unspecified: Secondary | ICD-10-CM | POA: Diagnosis not present

## 2017-02-09 DIAGNOSIS — R519 Headache, unspecified: Secondary | ICD-10-CM

## 2017-02-09 DIAGNOSIS — R351 Nocturia: Secondary | ICD-10-CM | POA: Diagnosis not present

## 2017-02-09 DIAGNOSIS — R0683 Snoring: Secondary | ICD-10-CM

## 2017-02-09 DIAGNOSIS — R030 Elevated blood-pressure reading, without diagnosis of hypertension: Secondary | ICD-10-CM

## 2017-02-09 DIAGNOSIS — R400 Somnolence: Secondary | ICD-10-CM | POA: Diagnosis not present

## 2017-02-09 DIAGNOSIS — Z6841 Body Mass Index (BMI) 40.0 and over, adult: Secondary | ICD-10-CM | POA: Diagnosis not present

## 2017-02-09 DIAGNOSIS — R600 Localized edema: Secondary | ICD-10-CM
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
## 2017-02-09 NOTE — Patient Instructions (Signed)

## 2017-02-12 ENCOUNTER — Telehealth: Payer: Self-pay | Admitting: Neurology

## 2017-02-12 DIAGNOSIS — R400 Somnolence: Secondary | ICD-10-CM

## 2017-02-12 NOTE — Addendum Note (Signed)
Addended by: Lester Robesonia A on: 02/12/2017 04:58 PM   Modules accepted: Orders

## 2017-02-12 NOTE — Telephone Encounter (Signed)
Pt's is not covered by her insurance to have a sleep study. Pt is willing to pay out of pocket for a hst. Can you please put in an order for a hst.

## 2017-02-12 NOTE — Telephone Encounter (Signed)
Order for HST placed.

## 2017-02-19 ENCOUNTER — Other Ambulatory Visit: Payer: Self-pay | Admitting: Internal Medicine

## 2017-03-02 ENCOUNTER — Telehealth: Payer: Self-pay | Admitting: Neurology

## 2017-03-02 NOTE — Telephone Encounter (Signed)
We have attempted to call the patient 3 times to schedule sleep study. Patient has been unavailable at the phone numbers we have on file and has not returned our calls. At this point we will send a letter asking pt to please contact the sleep lab to schedule their sleep study. If patient calls back we will schedule them for their sleep study. ° °

## 2017-03-11 ENCOUNTER — Encounter: Payer: PRIVATE HEALTH INSURANCE | Admitting: Women's Health

## 2017-04-04 ENCOUNTER — Encounter (HOSPITAL_COMMUNITY): Payer: Self-pay | Admitting: Emergency Medicine

## 2017-04-04 ENCOUNTER — Emergency Department (HOSPITAL_COMMUNITY)
Admission: EM | Admit: 2017-04-04 | Discharge: 2017-04-04 | Disposition: A | Discharge status: Home / Self Care | Payer: PRIVATE HEALTH INSURANCE | Attending: Emergency Medicine | Admitting: Emergency Medicine

## 2017-04-04 DIAGNOSIS — I10 Essential (primary) hypertension: Secondary | ICD-10-CM | POA: Insufficient documentation

## 2017-04-04 DIAGNOSIS — K0889 Other specified disorders of teeth and supporting structures: Secondary | ICD-10-CM | POA: Insufficient documentation

## 2017-04-04 DIAGNOSIS — Z79899 Other long term (current) drug therapy: Secondary | ICD-10-CM | POA: Insufficient documentation

## 2017-04-04 MED ORDER — AMOXICILLIN 500 MG PO CAPS: 1000.0000 mg | ORAL_CAPSULE | Freq: Two times a day (BID) | ORAL | 0 refills | Status: DC

## 2017-04-04 MED ORDER — HYDROCODONE-ACETAMINOPHEN 5-325 MG PO TABS: ORAL_TABLET | ORAL | 0 refills | Status: DC

## 2017-04-04 NOTE — ED Notes (Signed)
Bed: WTR7 Expected date:  Expected time:  Means of arrival:  Comments: 

## 2017-04-04 NOTE — Discharge Instructions (Signed)
Take vicodin for breakthrough pain, do not drink alcohol, drive, care for children or do other critical tasks while taking vicodin. ° °Please follow with your primary care doctor in the next 2 days for a check-up. They must obtain records for further management.  ° °Do not hesitate to return to the Emergency Department for any new, worsening or concerning symptoms.  ° °
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
## 2017-04-04 NOTE — ED Triage Notes (Signed)
Pt reports pain in lower l/tooth. Pain started 1 week ago. Pain increased yesterday. Pt is planning to have the tooth removed soon.

## 2017-04-16 ENCOUNTER — Other Ambulatory Visit: Payer: Self-pay | Admitting: Internal Medicine

## 2017-04-16 ENCOUNTER — Other Ambulatory Visit: Payer: Self-pay | Admitting: Family

## 2017-04-20 ENCOUNTER — Ambulatory Visit (INDEPENDENT_AMBULATORY_CARE_PROVIDER_SITE_OTHER): Payer: PRIVATE HEALTH INSURANCE | Admitting: Internal Medicine

## 2017-04-20 ENCOUNTER — Encounter: Payer: Self-pay | Admitting: Internal Medicine

## 2017-04-20 DIAGNOSIS — I10 Essential (primary) hypertension: Secondary | ICD-10-CM

## 2017-04-20 DIAGNOSIS — Z6841 Body Mass Index (BMI) 40.0 and over, adult: Secondary | ICD-10-CM | POA: Diagnosis not present

## 2017-04-20 MED ORDER — TOPIRAMATE 50 MG PO TABS: 50.0000 mg | ORAL_TABLET | Freq: Two times a day (BID) | ORAL | 3 refills | Status: DC

## 2017-04-20 MED ORDER — FEXOFENADINE HCL 180 MG PO TABS: 180.0000 mg | ORAL_TABLET | Freq: Every day | ORAL | 3 refills | Status: DC

## 2017-04-20 MED ORDER — OMEPRAZOLE 20 MG PO CPDR: DELAYED_RELEASE_CAPSULE | ORAL | 3 refills | Status: DC

## 2017-04-20 NOTE — Progress Notes (Signed)
   Subjective:    Patient ID: Emily Phelps, female    DOB: 20-Dec-1980, 36 y.o.   MRN: [id number]  HPI The patient is a 36 YO female coming in for blood pressure follow up. She has been having elevated BP for some time. Seen about 3 months ago and no changes made to blood pressure medicine. She is taking her losartan/hctz daily. Denies headaches, migraines, chest pains, SOB, abdominal pain. Working out some with a Clinical research associate and making some changes to her diet.   Review of Systems  Constitutional: Negative.   HENT: Negative.   Eyes: Negative.   Respiratory: Negative for cough, chest tightness and shortness of breath.   Cardiovascular: Negative for chest pain, palpitations and leg swelling.  Gastrointestinal: Negative for abdominal distention, abdominal pain, constipation, diarrhea, nausea and vomiting.  Musculoskeletal: Negative.   Skin: Negative.   Neurological: Negative.   Psychiatric/Behavioral: Negative.       Objective:   Physical Exam  Constitutional: She is oriented to person, place, and time. She appears well-developed and well-nourished.  HENT:  Head: Normocephalic and atraumatic.  Eyes: EOM are normal.  Neck: Normal range of motion.  Cardiovascular: Normal rate and regular rhythm.   Pulmonary/Chest: Effort normal and breath sounds normal. No respiratory distress. She has no wheezes. She has no rales.  Abdominal: Soft. Bowel sounds are normal. She exhibits no distension. There is no tenderness. There is no rebound.  Musculoskeletal: She exhibits no edema.  Neurological: She is alert and oriented to person, place, and time. Coordination normal.  Skin: Skin is warm and dry.  Psychiatric: She has a normal mood and affect.   Vitals:   04/20/17 0902  BP: 124/84  Pulse: 67  Temp: 97.9 F (36.6 C)  TempSrc: Oral  SpO2: 99%  Weight: (!) 378 lb (171.5 kg)  Height: 5\' 4"  (1.626 m)      Assessment & Plan:

## 2017-04-20 NOTE — Patient Instructions (Signed)
We will send in the refills of the medicine.

## 2017-04-20 NOTE — Assessment & Plan Note (Signed)
Weight is down some from last visit and she is exercising more and changes to diet. She is encouraged to continue these changes as she has several complications of her weight including hypertension, hyperlipidemia, knee pain.

## 2017-04-20 NOTE — Assessment & Plan Note (Signed)
BP at goal on losartan/hctz. Continue. Recent CMP reviewed and at goal. No changes today.

## 2017-04-27 ENCOUNTER — Ambulatory Visit: Payer: PRIVATE HEALTH INSURANCE | Admitting: Internal Medicine

## 2017-05-14 ENCOUNTER — Other Ambulatory Visit: Payer: Self-pay | Admitting: Internal Medicine

## 2017-06-12 DIAGNOSIS — Z0000 Encounter for general adult medical examination without abnormal findings: Secondary | ICD-10-CM | POA: Diagnosis not present

## 2017-08-24 ENCOUNTER — Other Ambulatory Visit: Payer: Self-pay | Admitting: Internal Medicine

## 2017-10-19 ENCOUNTER — Other Ambulatory Visit (INDEPENDENT_AMBULATORY_CARE_PROVIDER_SITE_OTHER): Payer: BLUE CROSS/BLUE SHIELD

## 2017-10-19 ENCOUNTER — Encounter: Payer: Self-pay | Admitting: Internal Medicine

## 2017-10-19 ENCOUNTER — Ambulatory Visit: Payer: BLUE CROSS/BLUE SHIELD | Admitting: Internal Medicine

## 2017-10-19 VITALS — BP 126/80 | HR 82 | Temp 98.1°F | Ht 64.0 in | Wt 386.0 lb

## 2017-10-19 DIAGNOSIS — I10 Essential (primary) hypertension: Secondary | ICD-10-CM | POA: Diagnosis not present

## 2017-10-19 DIAGNOSIS — Z6841 Body Mass Index (BMI) 40.0 and over, adult: Secondary | ICD-10-CM | POA: Diagnosis not present

## 2017-10-19 LAB — COMPREHENSIVE METABOLIC PANEL
ALT: 16 U/L (ref 0–35)
AST: 11 U/L (ref 0–37)
Albumin: 3.7 g/dL (ref 3.5–5.2)
Alkaline Phosphatase: 47 U/L (ref 39–117)
BUN: 11 mg/dL (ref 6–23)
CO2: 26 meq/L (ref 19–32)
Calcium: 10.3 mg/dL (ref 8.4–10.5)
Chloride: 104 mEq/L (ref 96–112)
Creatinine, Ser: 0.93 mg/dL (ref 0.40–1.20)
GFR: 88.59 mL/min (ref >60.00)
GLUCOSE: 139 mg/dL — AB (ref 70–99)
POTASSIUM: 4.2 meq/L (ref 3.5–5.1)
SODIUM: 138 meq/L (ref 135–145)
Total Bilirubin: 0.4 mg/dL (ref 0.2–1.2)
Total Protein: 6.9 g/dL (ref 6.0–8.3)

## 2017-10-19 LAB — LIPID PANEL
CHOL/HDL RATIO: 3
Cholesterol: 132 mg/dL (ref 0–200)
HDL: 42.8 mg/dL (ref >39.00)
LDL Cholesterol: 56 mg/dL (ref 0–99)
NONHDL: 89.49
Triglycerides: 169 mg/dL — ABNORMAL HIGH (ref 0.0–149.0)
VLDL: 33.8 mg/dL (ref 0.0–40.0)

## 2017-10-19 LAB — HEMOGLOBIN A1C: Hgb A1c MFr Bld: 5.2 % (ref 4.6–6.5)

## 2017-10-19 MED ORDER — LOSARTAN POTASSIUM-HCTZ 100-25 MG PO TABS: 1.0000 | ORAL_TABLET | Freq: Every day | ORAL | 3 refills | Status: DC

## 2017-10-19 MED ORDER — ATENOLOL 50 MG PO TABS: 50.0000 mg | ORAL_TABLET | Freq: Every day | ORAL | 3 refills | Status: DC

## 2017-10-19 NOTE — Progress Notes (Signed)
   Subjective:    Patient ID: Emily Phelps, female    DOB: 10/26/1980, 37 y.o.   MRN: [id number]  HPI The patient is a 37 YO female coming in for follow up of weight (weight is up about 6-8 pounds since last visit, is working on exercise more regular and some diet changes, she does have problems with cravings all the time even when she has eaten enough, she is trying to limit junk food) and blood pressure (BP at goal on atenolol and losartan/hctz, denies side effects, denies headaches or chest pains).   Review of Systems  Constitutional: Negative.   HENT: Negative.   Eyes: Negative.   Respiratory: Negative for cough, chest tightness and shortness of breath.   Cardiovascular: Negative for chest pain, palpitations and leg swelling.  Gastrointestinal: Negative for abdominal distention, abdominal pain, constipation, diarrhea, nausea and vomiting.  Musculoskeletal: Negative.   Skin: Negative.   Neurological: Negative.   Psychiatric/Behavioral: Negative.       Objective:   Physical Exam  Constitutional: She is oriented to person, place, and time. She appears well-developed and well-nourished.  overweight  HENT:  Head: Normocephalic and atraumatic.  Eyes: EOM are normal.  Neck: Normal range of motion.  Cardiovascular: Normal rate and regular rhythm.  Pulmonary/Chest: Effort normal and breath sounds normal. No respiratory distress. She has no wheezes. She has no rales.  Abdominal: Soft. Bowel sounds are normal. She exhibits no distension. There is no tenderness. There is no rebound.  Musculoskeletal: She exhibits no edema.  Neurological: She is alert and oriented to person, place, and time. Coordination normal.  Skin: Skin is warm and dry.  Psychiatric: She has a normal mood and affect.   Vitals:   10/19/17 0859  BP: 126/80  Pulse: 82  Temp: 98.1 F (36.7 C)  TempSrc: Oral  SpO2: 97%  Weight: (!) 386 lb (175.1 kg)  Height: 5\' 4"  (1.626 m)      Assessment & Plan:

## 2017-10-19 NOTE — Assessment & Plan Note (Signed)
BP at goal on atenolol and losartan/hctz.

## 2017-10-19 NOTE — Assessment & Plan Note (Signed)
Weight is up some from last time. Checking HgA1c and lipid panel for complications.

## 2017-10-19 NOTE — Patient Instructions (Signed)
[employer] eating is the book which has the information about eating and hunger that we talked about.

## 2018-03-10 ENCOUNTER — Other Ambulatory Visit: Payer: Self-pay | Admitting: Internal Medicine

## 2018-05-10 ENCOUNTER — Other Ambulatory Visit: Payer: Self-pay | Admitting: Internal Medicine

## 2018-06-14 ENCOUNTER — Other Ambulatory Visit: Payer: Self-pay | Admitting: Internal Medicine

## 2018-06-17 ENCOUNTER — Telehealth: Payer: Self-pay

## 2018-06-17 ENCOUNTER — Telehealth: Payer: Self-pay | Admitting: Internal Medicine

## 2018-06-17 MED ORDER — HYDROCHLOROTHIAZIDE 25 MG PO TABS: 25.0000 mg | ORAL_TABLET | Freq: Every day | ORAL | 3 refills | Status: DC

## 2018-06-17 MED ORDER — LOSARTAN POTASSIUM 100 MG PO TABS: 100.0000 mg | ORAL_TABLET | Freq: Every day | ORAL | 3 refills | Status: DC

## 2018-06-17 NOTE — Telephone Encounter (Signed)
Copied from [place] [id number]. Topic: General - Other >> Jun 17, 2018 11:48 AM Lennox Solders wrote: Reason for CRM: losartan is on back order and pt needs new medication for her bp. [facility]. Pt is out med

## 2018-06-17 NOTE — Telephone Encounter (Signed)
Copied from [place] [id number]. Topic: General - Other >> Jun 17, 2018 11:47 AM Carolyn Stare wrote:  Pharmacy called to say the following med is on back order losartan-hydrochlorothiazide (HYZAAR) 100-25 MG tablet   and is asking if the doctor can write new scripts for   Losartan 100 mg and HTCZ 25MG    [facility]

## 2018-06-17 NOTE — Telephone Encounter (Signed)
Sent in separate rx

## 2018-06-17 NOTE — Telephone Encounter (Signed)
Taken care of in another telephone encounter.

## 2018-06-17 NOTE — Addendum Note (Signed)
Addended by: Pricilla Holm A on: 06/17/2018 03:28 PM   Modules accepted: Orders

## 2018-06-25 ENCOUNTER — Ambulatory Visit: Payer: BLUE CROSS/BLUE SHIELD | Admitting: Internal Medicine

## 2018-06-25 ENCOUNTER — Ambulatory Visit: Payer: BLUE CROSS/BLUE SHIELD | Admitting: Nurse Practitioner

## 2018-06-25 ENCOUNTER — Encounter: Payer: Self-pay | Admitting: Internal Medicine

## 2018-06-25 VITALS — BP 130/82 | HR 83 | Temp 98.5°F | Ht 64.0 in | Wt >= 6400 oz

## 2018-06-25 DIAGNOSIS — R1032 Left lower quadrant pain: Secondary | ICD-10-CM | POA: Insufficient documentation

## 2018-06-25 DIAGNOSIS — R059 Cough, unspecified: Secondary | ICD-10-CM

## 2018-06-25 DIAGNOSIS — R05 Cough: Secondary | ICD-10-CM

## 2018-06-25 DIAGNOSIS — K219 Gastro-esophageal reflux disease without esophagitis: Secondary | ICD-10-CM | POA: Diagnosis not present

## 2018-06-25 LAB — POCT EXHALED NITRIC OXIDE: FENO LEVEL (PPB): 10

## 2018-06-25 IMAGING — DX DG CHEST 2V
2 series · 2 of 2 positions shown · non-contrast
Comparison: None.

CLINICAL DATA: Dry cough for 2 months

EXAM:
CHEST  2 VIEW

[chest pa]
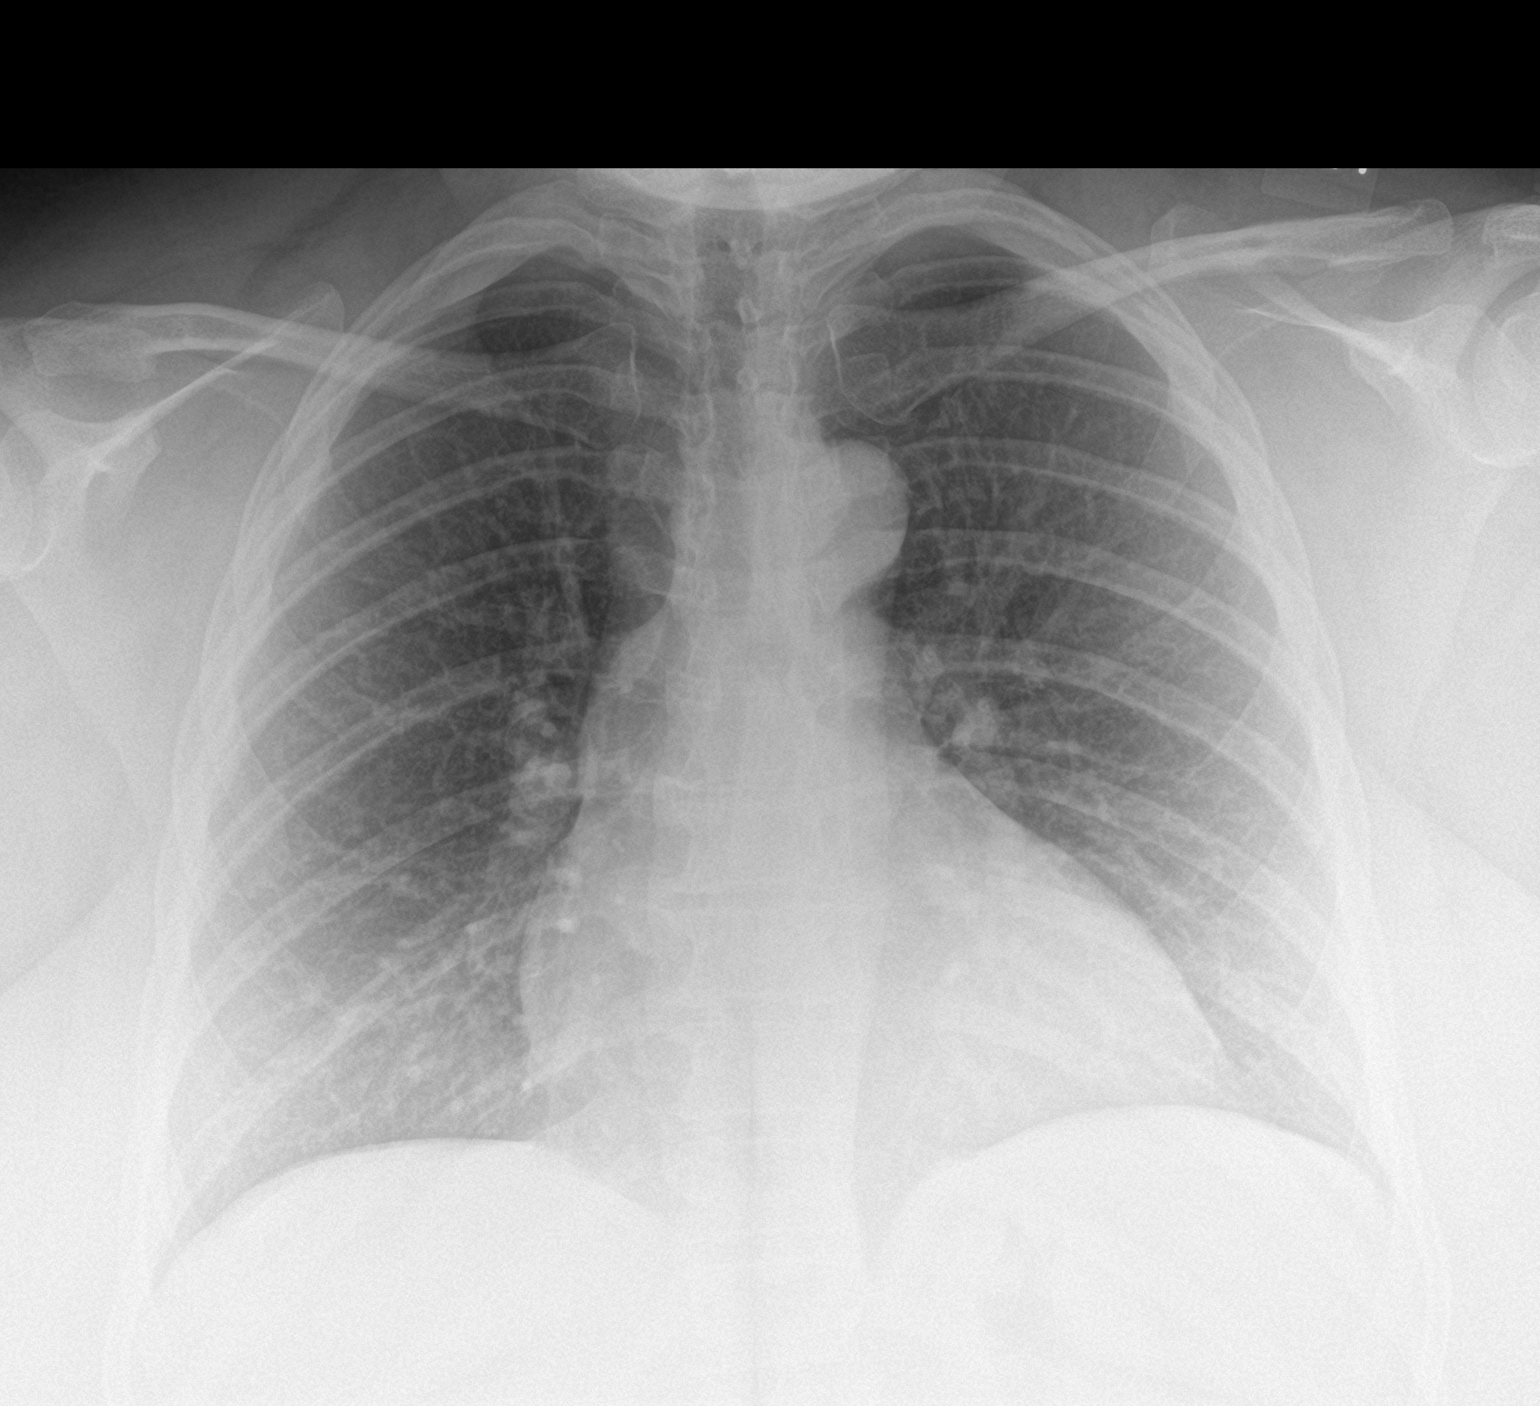

[chest lat]
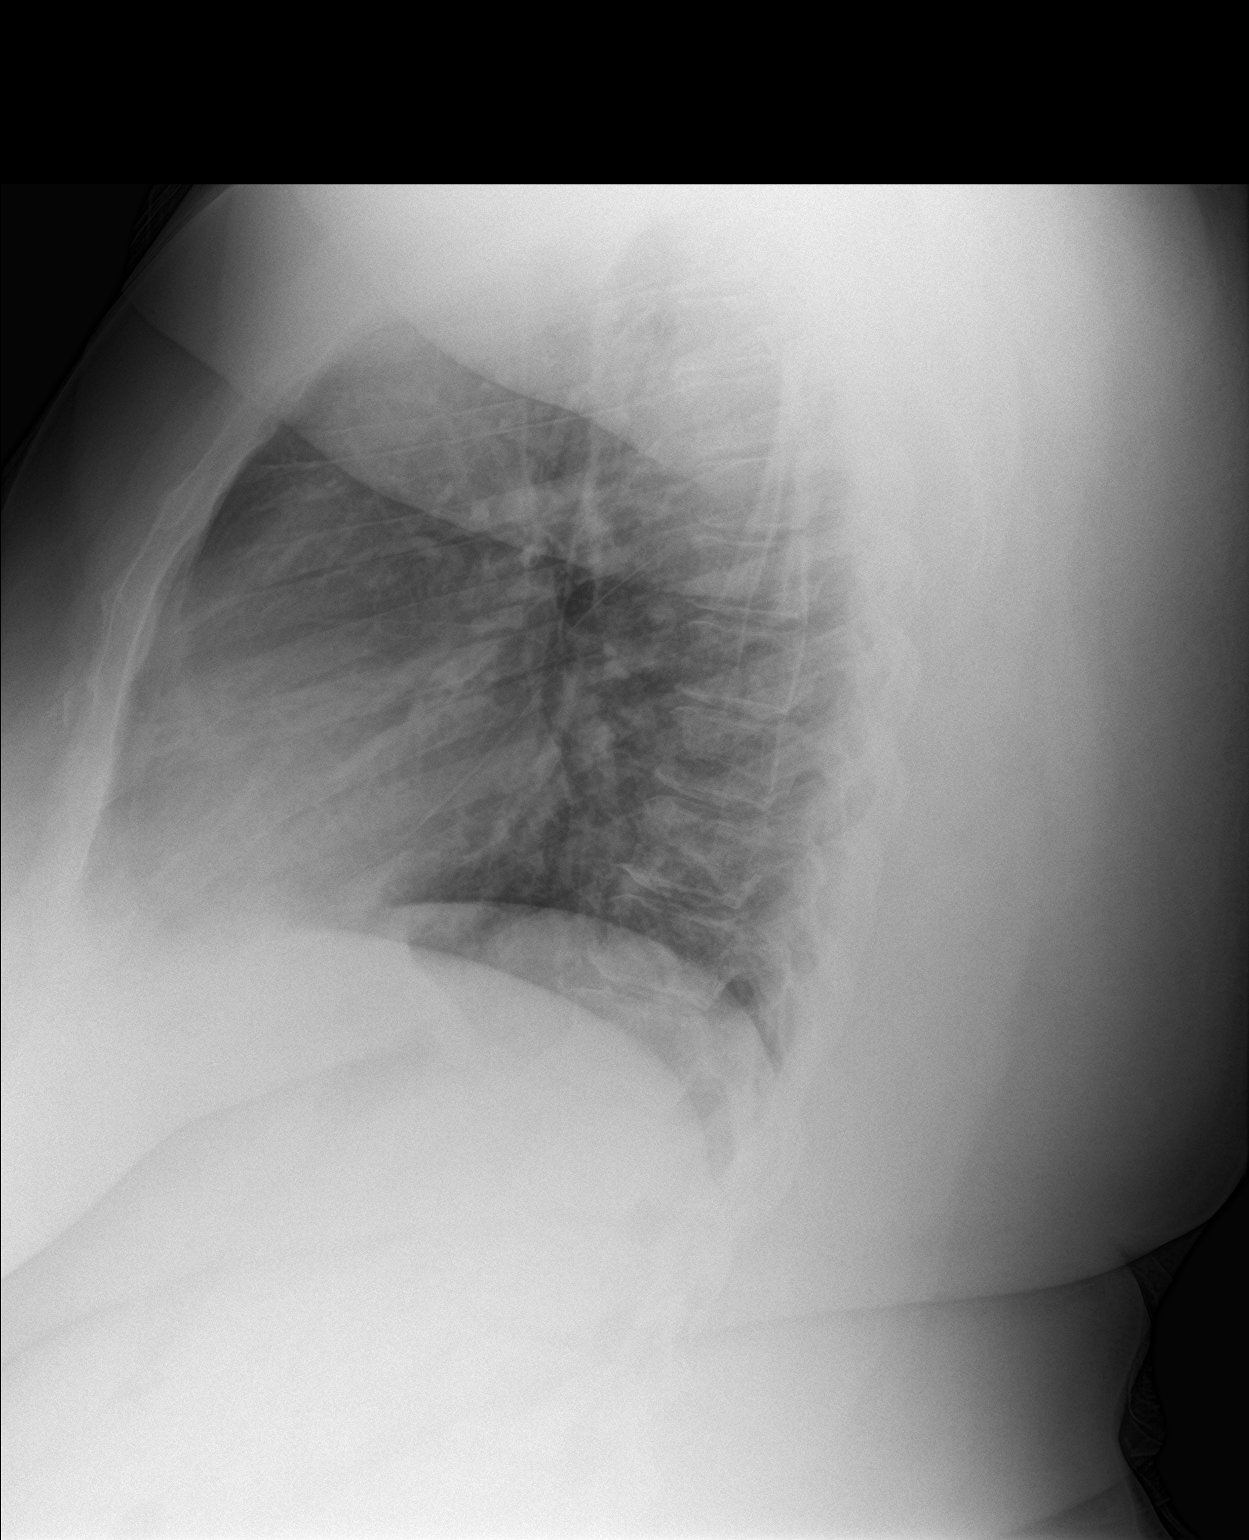

[2 of 2 positions shown; findings below may reference images not displayed]

FINDINGS: The heart size and mediastinal contours are within normal limits.
Both lungs are clear. The visualized skeletal structures are
unremarkable.
IMPRESSION: No active cardiopulmonary disease.

## 2018-06-25 MED ORDER — FLUTICASONE PROPIONATE 50 MCG/ACT NA SUSP: 2.0000 | Freq: Every day | NASAL | 6 refills | Status: DC

## 2018-06-25 NOTE — Assessment & Plan Note (Signed)
We talked about how this could be worsening and causing the cough but given findings on exam we will treat allergic rhinitis first and then adjust omeprazole if needed.

## 2018-06-25 NOTE — Assessment & Plan Note (Signed)
Ordered US pelvis to assess for ovarian cysts. If nothing found could get x-ray hip as pain location is similar.

## 2018-06-25 NOTE — Assessment & Plan Note (Signed)
FENO done in office 10 ruling out asthma. Suspect allergic rhinitis versus GERD. Will treat with flonase for 2 weeks and if no improvement will change omeprazole to treat for GERD better. Lungs clear on exam.

## 2018-06-25 NOTE — Progress Notes (Signed)
   Subjective:    Patient ID: Emily Phelps, female    DOB: [birth year], 37 y.o.   MRN: [id number]  HPI The patient is a 37 YO female coming in for several concerns including left groin pain (LLQ, stable for 3 months or so, started working out again and thought that this was the cause, has rested for the last month and pain has not changed, 4/10 pain, has not taken anything for it, thought maybe it was gas and took gas-x and this did not help, denies diarrhea or constipation, denies nausea or vomiting, does have GERD and does not notice worsening of this, on birth control and no irregular bleeding), and cough (started about 3 weeks or so ago, denies fevers or chills, denies SOB, denies coughing up anything, had some right sided chest pain along with this and this is mostly gone now) as well as some other concerns which we were unable to address.   Review of Systems  Constitutional: Negative.   HENT: Positive for postnasal drip. Negative for congestion, dental problem, ear discharge, ear pain, mouth sores, nosebleeds, sinus pressure, sore throat, tinnitus and voice change.   Eyes: Negative.   Respiratory: Positive for cough. Negative for chest tightness, shortness of breath and wheezing.   Cardiovascular: Negative for chest pain, palpitations and leg swelling.  Gastrointestinal: Positive for abdominal pain. Negative for abdominal distention, anal bleeding, blood in stool, constipation, diarrhea, nausea, rectal pain and vomiting.  Musculoskeletal: Negative.   Skin: Negative.   Neurological: Negative.   Psychiatric/Behavioral: Negative.       Objective:   Physical Exam Constitutional:      Appearance: She is well-developed.  HENT:     Head: Normocephalic and atraumatic.     Comments: Oropharynx redness with clear drainage.  Neck:     Musculoskeletal: Normal range of motion.  Cardiovascular:     Rate and Rhythm: Normal rate and regular rhythm.  Pulmonary:     Effort: Pulmonary effort is  normal. No respiratory distress.     Breath sounds: Normal breath sounds. No wheezing or rales.  Abdominal:     General: Bowel sounds are normal. There is no distension.     Palpations: Abdomen is soft.     Tenderness: There is no abdominal tenderness. There is no rebound.     Comments: Some tenderness low pelvis LLQ  Skin:    General: Skin is warm and dry.  Neurological:     Mental Status: She is alert and oriented to person, place, and time.     Coordination: Coordination normal.    Vitals:   06/25/18 1338  BP: 130/82  Pulse: 83  Temp: 98.5 F (36.9 C)  TempSrc: Oral  SpO2: 97%  Weight: (!) 405 lb (183.7 kg)  Height: 5\' 4"  (1.626 m)   FENO 10    Assessment & Plan:

## 2018-06-25 NOTE — Patient Instructions (Signed)
We will have you start doing flonase which is a nose spray to use 2 sprays in each nostril.   We will get an ultrasound of the stomach to look for any problems causing the pain.

## 2018-07-08 ENCOUNTER — Ambulatory Visit
Admission: RE | Admit: 2018-07-08 | Discharge: 2018-07-08 | Disposition: A | Discharge status: Home / Self Care | Payer: BLUE CROSS/BLUE SHIELD | Source: Ambulatory Visit | Attending: Internal Medicine | Admitting: Internal Medicine

## 2018-07-08 DIAGNOSIS — D251 Intramural leiomyoma of uterus: Secondary | ICD-10-CM | POA: Diagnosis not present

## 2018-07-08 DIAGNOSIS — R1032 Left lower quadrant pain: Secondary | ICD-10-CM

## 2018-07-08 DIAGNOSIS — D252 Subserosal leiomyoma of uterus: Secondary | ICD-10-CM | POA: Diagnosis not present

## 2018-07-15 ENCOUNTER — Encounter: Payer: Self-pay | Admitting: Internal Medicine

## 2018-07-15 ENCOUNTER — Ambulatory Visit: Payer: BLUE CROSS/BLUE SHIELD | Admitting: Internal Medicine

## 2018-07-15 DIAGNOSIS — G43809 Other migraine, not intractable, without status migrainosus: Secondary | ICD-10-CM

## 2018-07-15 DIAGNOSIS — R05 Cough: Secondary | ICD-10-CM | POA: Diagnosis not present

## 2018-07-15 DIAGNOSIS — R350 Frequency of micturition: Secondary | ICD-10-CM

## 2018-07-15 DIAGNOSIS — R1032 Left lower quadrant pain: Secondary | ICD-10-CM | POA: Diagnosis not present

## 2018-07-15 DIAGNOSIS — R059 Cough, unspecified: Secondary | ICD-10-CM

## 2018-07-15 MED ORDER — DOXYCYCLINE HYCLATE 100 MG PO TABS: 100.0000 mg | ORAL_TABLET | Freq: Two times a day (BID) | ORAL | 0 refills | Status: DC

## 2018-07-15 NOTE — Patient Instructions (Signed)
We have sent in an antibiotic to take doxycycline 1 pill twice a day for 5 days.   We will get the paperwork and the letter done and fax them Monday.

## 2018-07-15 NOTE — Progress Notes (Signed)
   Subjective:   Patient ID: Emily Phelps, female    DOB: 01-26-1981, 38 y.o.   MRN: [id number]  HPI The patient is a 38 YO female coming in for several concerns including migraines (having more stress lately, having more migraines, several a week, mostly she can work through them, the light at work sometimes makes them worse, she is needing intermittent FMLA for this, still taking topamax but has not been working as well lately), and her urinary incontinence (needs more frequent bathroom breaks, consistent lately, she needs accommodation letter for work for this separately), and lower abdomen pain (still having pain in the LLQ, has not been taking anything for it, [place] with cyst on the fallopian tube and possible ovary, seeing ob/gyn in end of February for follow up US, pain is worse with bending and some positions) as well as cough (still persistent and worsening since last visit about 2 weeks ago, she is taking the allergy medication which has stopped some of the sinus drainage, cough is worse with green sputum, no fevers or chills, some SOB).   Review of Systems  Constitutional: Positive for activity change and appetite change. Negative for chills, fatigue, fever and unexpected weight change.  HENT: Positive for congestion, postnasal drip and rhinorrhea. Negative for ear discharge, ear pain, sinus pressure, sinus pain, sneezing, sore throat, tinnitus, trouble swallowing and voice change.   Eyes: Negative.   Respiratory: Positive for cough and shortness of breath. Negative for chest tightness and wheezing.   Cardiovascular: Negative.   Gastrointestinal: Positive for abdominal pain.  Genitourinary: Positive for frequency and urgency.  Musculoskeletal: Positive for myalgias.  Skin: Negative.   Neurological: Negative.   Psychiatric/Behavioral: Negative.     Objective:  Physical Exam Constitutional:      Appearance: She is well-developed.  HENT:     Head: Normocephalic and atraumatic.   Comments: Oropharynx with redness and clear drainage, nose with swollen turbinates, TMs normal bilaterally.  Neck:     Musculoskeletal: Normal range of motion.     Thyroid: No thyromegaly.  Cardiovascular:     Rate and Rhythm: Normal rate and regular rhythm.  Pulmonary:     Effort: Pulmonary effort is normal. No respiratory distress.     Breath sounds: Rhonchi present. No wheezing or rales.  Abdominal:     General: Bowel sounds are normal. There is no distension.     Palpations: Abdomen is soft.     Tenderness: There is abdominal tenderness. There is no rebound.     Comments: Minimal tenderness LLQ low without guarding or rebound  Musculoskeletal:        General: Tenderness present.  Lymphadenopathy:     Cervical: No cervical adenopathy.  Skin:    General: Skin is warm and dry.  Neurological:     Mental Status: She is alert and oriented to person, place, and time.     Coordination: Coordination normal.     Vitals:   07/15/18 1418  BP: 138/84  Pulse: 84  Temp: 97.6 F (36.4 C)  TempSrc: Oral  SpO2: 99%  Weight: (!) 414 lb (187.8 kg)  Height: 5\' 4"  (1.626 m)    Assessment & Plan:

## 2018-07-16 DIAGNOSIS — R350 Frequency of micturition: Secondary | ICD-10-CM | POA: Insufficient documentation

## 2018-07-16 NOTE — Assessment & Plan Note (Signed)
Advised to take aleve BID for 1 week to help with pain and she will do this.

## 2018-07-16 NOTE — Assessment & Plan Note (Signed)
Worse due to stress. Still taking topamax daily and this is not helping as much. Will do FMLA for 5 days per month and if not calming down with less stress may need medication adjustment.

## 2018-07-16 NOTE — Assessment & Plan Note (Signed)
Worsening and new lung findings. Rx for doxycycline.

## 2018-07-16 NOTE — Assessment & Plan Note (Signed)
Note done for accommodation. Does not need medication and no clinical indication of UTI.

## 2018-09-02 DIAGNOSIS — Z01419 Encounter for gynecological examination (general) (routine) without abnormal findings: Secondary | ICD-10-CM | POA: Diagnosis not present

## 2018-09-02 DIAGNOSIS — Z124 Encounter for screening for malignant neoplasm of cervix: Secondary | ICD-10-CM | POA: Diagnosis not present

## 2018-09-02 DIAGNOSIS — D251 Intramural leiomyoma of uterus: Secondary | ICD-10-CM | POA: Diagnosis not present

## 2018-09-02 DIAGNOSIS — Z1151 Encounter for screening for human papillomavirus (HPV): Secondary | ICD-10-CM | POA: Diagnosis not present

## 2018-09-02 DIAGNOSIS — Z113 Encounter for screening for infections with a predominantly sexual mode of transmission: Secondary | ICD-10-CM | POA: Diagnosis not present

## 2018-09-02 DIAGNOSIS — D252 Subserosal leiomyoma of uterus: Secondary | ICD-10-CM | POA: Diagnosis not present

## 2018-09-02 DIAGNOSIS — Z6841 Body Mass Index (BMI) 40.0 and over, adult: Secondary | ICD-10-CM | POA: Diagnosis not present

## 2018-09-10 ENCOUNTER — Other Ambulatory Visit: Payer: Self-pay | Admitting: Internal Medicine

## 2018-09-24 ENCOUNTER — Other Ambulatory Visit (INDEPENDENT_AMBULATORY_CARE_PROVIDER_SITE_OTHER): Payer: BLUE CROSS/BLUE SHIELD

## 2018-09-24 ENCOUNTER — Ambulatory Visit (INDEPENDENT_AMBULATORY_CARE_PROVIDER_SITE_OTHER): Payer: BLUE CROSS/BLUE SHIELD | Admitting: Internal Medicine

## 2018-09-24 ENCOUNTER — Other Ambulatory Visit: Payer: Self-pay

## 2018-09-24 ENCOUNTER — Encounter: Payer: Self-pay | Admitting: Internal Medicine

## 2018-09-24 VITALS — BP 170/110 | HR 98 | Temp 97.6°F | Ht 64.0 in | Wt >= 6400 oz

## 2018-09-24 DIAGNOSIS — E782 Mixed hyperlipidemia: Secondary | ICD-10-CM | POA: Diagnosis not present

## 2018-09-24 DIAGNOSIS — Z0000 Encounter for general adult medical examination without abnormal findings: Secondary | ICD-10-CM | POA: Diagnosis not present

## 2018-09-24 DIAGNOSIS — G43809 Other migraine, not intractable, without status migrainosus: Secondary | ICD-10-CM | POA: Diagnosis not present

## 2018-09-24 DIAGNOSIS — I10 Essential (primary) hypertension: Secondary | ICD-10-CM

## 2018-09-24 LAB — CBC
HCT: 37.1 % (ref 36.0–46.0)
Hemoglobin: 12.4 g/dL (ref 12.0–15.0)
MCHC: 33.4 g/dL (ref 30.0–36.0)
MCV: 77.1 fl — ABNORMAL LOW (ref 78.0–100.0)
Platelets: 430 10*3/uL — ABNORMAL HIGH (ref 150.0–400.0)
RBC: 4.82 Mil/uL (ref 3.87–5.11)
RDW: 17.1 % — ABNORMAL HIGH (ref 11.5–15.5)
WBC: 12.9 10*3/uL — ABNORMAL HIGH (ref 4.0–10.5)

## 2018-09-24 LAB — LIPID PANEL
Cholesterol: 157 mg/dL (ref 0–200)
HDL: 42.9 mg/dL (ref >39.00)
LDL Cholesterol: 76 mg/dL (ref 0–99)
NonHDL: 114.4
Total CHOL/HDL Ratio: 4
Triglycerides: 194 mg/dL — ABNORMAL HIGH (ref 0.0–149.0)
VLDL: 38.8 mg/dL (ref 0.0–40.0)

## 2018-09-24 LAB — COMPREHENSIVE METABOLIC PANEL
ALT: 22 U/L (ref 0–35)
AST: 14 U/L (ref 0–37)
Albumin: 4.2 g/dL (ref 3.5–5.2)
Alkaline Phosphatase: 76 U/L (ref 39–117)
BILIRUBIN TOTAL: 0.5 mg/dL (ref 0.2–1.2)
BUN: 12 mg/dL (ref 6–23)
CO2: 25 mEq/L (ref 19–32)
Calcium: 10.6 mg/dL — ABNORMAL HIGH (ref 8.4–10.5)
Chloride: 105 mEq/L (ref 96–112)
Creatinine, Ser: 0.72 mg/dL (ref 0.40–1.20)
GFR: 111.38 mL/min (ref >60.00)
Glucose, Bld: 102 mg/dL — ABNORMAL HIGH (ref 70–99)
Potassium: 3.7 mEq/L (ref 3.5–5.1)
Sodium: 139 mEq/L (ref 135–145)
Total Protein: 7.4 g/dL (ref 6.0–8.3)

## 2018-09-24 LAB — HEMOGLOBIN A1C: Hgb A1c MFr Bld: 5.5 % (ref 4.6–6.5)

## 2018-09-24 LAB — TSH: TSH: 1.65 u[IU]/mL (ref 0.35–4.50)

## 2018-09-24 NOTE — Assessment & Plan Note (Signed)
Flu shot up to date. Tetanus up to date. Pap smear up to date with gyn. Counseled about sun safety and mole surveillance. Counseled about the dangers of distracted driving. Given 10 year screening recommendations.   

## 2018-09-24 NOTE — Assessment & Plan Note (Signed)
BP is elevated today however has not been previously. Patient admits to missing meds today and stressful day.

## 2018-09-24 NOTE — Assessment & Plan Note (Signed)
Weight is increased slightly from prior. Talked with her about exercise and diet.

## 2018-09-24 NOTE — Assessment & Plan Note (Signed)
Checking lipid panel and adjust as needed. No meds currently.  

## 2018-09-24 NOTE — Progress Notes (Signed)
   Subjective:   Patient ID: Emily Phelps, female    DOB: 12-10-1980, 38 y.o.   MRN: [id number]  HPI The patient is a 38 YO female coming in for physical.   PMH, [place], social history reviewed and updated  Review of Systems  Constitutional: Negative.   HENT: Negative.   Eyes: Negative.   Respiratory: Negative for cough, chest tightness and shortness of breath.   Cardiovascular: Negative for chest pain, palpitations and leg swelling.  Gastrointestinal: Negative for abdominal distention, abdominal pain, constipation, diarrhea, nausea and vomiting.  Musculoskeletal: Negative.   Skin: Negative.   Neurological: Negative.   Psychiatric/Behavioral: Negative.     Objective:  Physical Exam Constitutional:      Appearance: She is well-developed. She is obese.  HENT:     Head: Normocephalic and atraumatic.  Neck:     Musculoskeletal: Normal range of motion.  Cardiovascular:     Rate and Rhythm: Normal rate and regular rhythm.  Pulmonary:     Effort: Pulmonary effort is normal. No respiratory distress.     Breath sounds: Normal breath sounds. No wheezing or rales.  Abdominal:     General: Bowel sounds are normal. There is no distension.     Palpations: Abdomen is soft.     Tenderness: There is no abdominal tenderness. There is no rebound.  Skin:    General: Skin is warm and dry.  Neurological:     Mental Status: She is alert and oriented to person, place, and time.     Coordination: Coordination normal.     Vitals:   09/24/18 1419 09/24/18 1502  BP: (!) 170/100 (!) 170/110  Pulse: 98   Temp: 97.6 F (36.4 C)   TempSrc: Oral   SpO2: 98%   Weight: (!) 413 lb (187.3 kg)   Height: 5\' 4"  (1.626 m)     Assessment & Plan:

## 2018-09-24 NOTE — Patient Instructions (Signed)

## 2018-09-24 NOTE — Assessment & Plan Note (Signed)
Topamax helps with prevention and will continue.

## 2018-10-13 ENCOUNTER — Encounter: Payer: Self-pay | Admitting: Internal Medicine

## 2018-10-14 ENCOUNTER — Ambulatory Visit (INDEPENDENT_AMBULATORY_CARE_PROVIDER_SITE_OTHER): Payer: BLUE CROSS/BLUE SHIELD | Admitting: Internal Medicine

## 2018-10-14 ENCOUNTER — Other Ambulatory Visit: Payer: Self-pay

## 2018-10-14 ENCOUNTER — Encounter: Payer: Self-pay | Admitting: Internal Medicine

## 2018-10-14 DIAGNOSIS — J301 Allergic rhinitis due to pollen: Secondary | ICD-10-CM | POA: Diagnosis not present

## 2018-10-14 MED ORDER — BENZONATATE 200 MG PO CAPS: 200.0000 mg | ORAL_CAPSULE | Freq: Three times a day (TID) | ORAL | 0 refills | Status: DC | PRN

## 2018-10-14 MED ORDER — PROMETHAZINE-DM 6.25-15 MG/5ML PO SYRP: 5.0000 mL | ORAL_SOLUTION | Freq: Two times a day (BID) | ORAL | 0 refills | Status: DC | PRN

## 2018-10-14 NOTE — Progress Notes (Signed)
Virtual Visit via Video Note  I connected with Emily Phelps on 10/14/18 at  3:40 PM EDT by a video enabled telemedicine application and verified that I am speaking with the correct person using two identifiers.   I discussed the limitations of evaluation and management by telemedicine and the availability of in person appointments. The patient expressed understanding and agreed to proceed.  History of Present Illness: The patient is a 38 y.o. YO female with visit for cough. Does have seasonal allergies and is taking allegra and flonase. Started about 2 weeks ago and overall is stable. Has cough more with talking a lot at her job and in the evening. Denies fevers or chills or body aches. Overall it is stable. Denies known exposure to covid-19.   Observations/Objective: Appearance: normal, breathing appears normal, normal grooming, abdomen does not appear distended, throat mild redness, mental status is A and O times 3  Assessment and Plan: See problem oriented charting  Follow Up Instructions: rx for tessalon perles for daytime and promethazine/dm cough syrup for night time  I discussed the assessment and treatment plan with the patient. The patient was provided an opportunity to ask questions and all were answered. The patient agreed with the plan and demonstrated an understanding of the instructions.   The patient was advised to call back or seek an in-person evaluation if the symptoms worsen or if the condition fails to improve as anticipated.  Hoyt Koch, MD

## 2018-10-14 NOTE — Assessment & Plan Note (Addendum)
Suspect cause of coughing. Low suspicion for covid-19 at this time. Importance of social distancing was discussed during the visit. Rx for tessalon perles and promethazine/dm cough syrup. Advised to continue allegra and flonase. No indication for antibiotics or steroids at this time.

## 2019-01-07 ENCOUNTER — Other Ambulatory Visit: Payer: Self-pay

## 2019-01-07 ENCOUNTER — Ambulatory Visit (INDEPENDENT_AMBULATORY_CARE_PROVIDER_SITE_OTHER): Payer: BC Managed Care – PPO | Admitting: Internal Medicine

## 2019-01-07 ENCOUNTER — Encounter: Payer: Self-pay | Admitting: Internal Medicine

## 2019-01-07 DIAGNOSIS — L918 Other hypertrophic disorders of the skin: Secondary | ICD-10-CM | POA: Insufficient documentation

## 2019-01-07 DIAGNOSIS — M542 Cervicalgia: Secondary | ICD-10-CM

## 2019-01-07 NOTE — Progress Notes (Signed)
Patient ID: Emily Phelps, female   DOB: [birth year], 38 y.o.   MRN: [id number] Skin Tag Removal Procedure Note  Pre-operative Diagnosis: Classic skin tags (acrochordon)  Post-operative Diagnosis: Classic skin tags (acrochordon)  Locations:3 on the posterior and lateral neck, 1 on the right chest wall   Indications: pain  Anesthesia: freeze skin spray    Procedure Details  The risks (including bleeding and infection) and benefits of the procedure and Written informed consent obtained. Using sterile iris scissors, multiple skin tags were snipped off at their bases after cleansing with alcohol.  Bleeding was controlled by pressure.   Findings: Pathognomonic benign lesions  not sent for pathological exam.  Condition: Stable  Complications: none.  Plan: 1. Instructed to keep the wounds dry and covered for 24-48h and clean thereafter. 2. Warning signs of infection were reviewed.   3. Recommended that the patient use OTC acetaminophen as needed for pain.  4. Return as needed.

## 2019-01-07 NOTE — Assessment & Plan Note (Signed)
Removed 4 skin tags and procedure tolerated well. Wound care instructions given to patient as well as return precautions.

## 2019-01-07 NOTE — Progress Notes (Signed)
   Subjective:   Patient ID: Emily Phelps, female    DOB: 05-May-1981, 38 y.o.   MRN: [id number]  HPI The patient is a 38 YO female coming in for pain and discomfort around her neck. 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 Denies fevers or chills. Denies cough or chest tightness. She has 3 skin tags on the neck there which is bothering her. Also has one on the right chest wall above the breast. Noticed it about months or so ago. Overall growing since that time.   Review of Systems  Constitutional: Negative.   HENT: Negative.   Eyes: Negative.   Respiratory: Negative for cough, chest tightness and shortness of breath.   Cardiovascular: Negative for chest pain, palpitations and leg swelling.  Gastrointestinal: Negative for abdominal distention, abdominal pain, constipation, diarrhea, nausea and vomiting.  Musculoskeletal: Negative.   Skin: Negative.   Neurological: Negative.   Psychiatric/Behavioral: Negative.     Objective:  Physical Exam Constitutional:      Appearance: She is well-developed.  HENT:     Head: Normocephalic and atraumatic.  Neck:     Musculoskeletal: Normal range of motion.  Cardiovascular:     Rate and Rhythm: Normal rate and regular rhythm.  Pulmonary:     Effort: Pulmonary effort is normal. No respiratory distress.     Breath sounds: Normal breath sounds. No wheezing or rales.  Abdominal:     General: Bowel sounds are normal. There is no distension.     Palpations: Abdomen is soft.     Tenderness: There is no abdominal tenderness. There is no rebound.  Skin:    General: Skin is warm and dry.  Neurological:     Mental Status: She is alert and oriented to person, place, and time.     Coordination: Coordination normal.     Vitals:   01/07/19 1259  BP: (!) 150/100  Pulse: 90  Temp: 98.3 F (36.8 C)  TempSrc: Oral  SpO2: 98%  Weight: (!) 422 lb (191.4 kg)  Height: 5\' 4"  (1.626 m)    Assessment & Plan:

## 2019-01-07 NOTE — Patient Instructions (Addendum)
You can either upload the work form to [employer] or fax it to [place] at [phone].  You can just keep the bandage on for a few hours. No special care is needed as the skin tags will heal just like a paper cut.

## 2019-01-07 NOTE — Assessment & Plan Note (Signed)
Due to skin tag pulling and tearing. Removed 4 skin tags for definitive therapy.

## 2019-02-14 ENCOUNTER — Other Ambulatory Visit: Payer: Self-pay | Admitting: Internal Medicine

## 2019-03-03 ENCOUNTER — Ambulatory Visit (INDEPENDENT_AMBULATORY_CARE_PROVIDER_SITE_OTHER): Payer: BC Managed Care – PPO | Admitting: Internal Medicine

## 2019-03-03 ENCOUNTER — Encounter: Payer: Self-pay | Admitting: Internal Medicine

## 2019-03-03 ENCOUNTER — Other Ambulatory Visit: Payer: Self-pay

## 2019-03-03 DIAGNOSIS — R6889 Other general symptoms and signs: Secondary | ICD-10-CM | POA: Diagnosis not present

## 2019-03-03 DIAGNOSIS — R509 Fever, unspecified: Secondary | ICD-10-CM | POA: Diagnosis not present

## 2019-03-03 DIAGNOSIS — B349 Viral infection, unspecified: Secondary | ICD-10-CM

## 2019-03-03 DIAGNOSIS — K921 Melena: Secondary | ICD-10-CM | POA: Insufficient documentation

## 2019-03-03 DIAGNOSIS — F410 Panic disorder [episodic paroxysmal anxiety] without agoraphobia: Secondary | ICD-10-CM

## 2019-03-03 DIAGNOSIS — I10 Essential (primary) hypertension: Secondary | ICD-10-CM

## 2019-03-03 DIAGNOSIS — Z20822 Contact with and (suspected) exposure to covid-19: Secondary | ICD-10-CM

## 2019-03-03 MED ORDER — AZITHROMYCIN 250 MG PO TABS: ORAL_TABLET | ORAL | 1 refills | Status: DC

## 2019-03-03 MED ORDER — CITALOPRAM HYDROBROMIDE 20 MG PO TABS: 20.0000 mg | ORAL_TABLET | Freq: Every day | ORAL | 3 refills | Status: DC

## 2019-03-03 MED ORDER — ONDANSETRON HCL 4 MG PO TABS: 4.0000 mg | ORAL_TABLET | Freq: Three times a day (TID) | ORAL | 1 refills | Status: DC | PRN

## 2019-03-03 NOTE — Assessment & Plan Note (Signed)
Ok for celexa 20 qd, f/u pcp

## 2019-03-03 NOTE — Patient Instructions (Signed)
Please take all new medication as prescribed  Please continue all other medications as before, and refills have been done if requested.  Please have the pharmacy call with any other refills you may need.  Please continue your efforts at being more active, low cholesterol diet, and weight control.  Please keep your appointments with your specialists as you may have planned    

## 2019-03-03 NOTE — Assessment & Plan Note (Signed)
Small volume, declines labs for now, refer GI

## 2019-03-03 NOTE — Assessment & Plan Note (Signed)
stable overall by history and exam, recent data reviewed with pt, and pt to continue medical treatment as before,  to f/u any worsening symptoms or concerns  
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
## 2019-03-03 NOTE — Assessment & Plan Note (Signed)
?   Viral illness vs bacterial URI, zpack x 1, zofran prn, immodium prn,  to f/u any worsening symptoms or concerns

## 2019-03-04 LAB — NOVEL CORONAVIRUS, NAA: SARS-CoV-2, NAA: NOT DETECTED

## 2019-03-07 ENCOUNTER — Telehealth: Payer: Self-pay

## 2019-03-07 NOTE — Telephone Encounter (Signed)
Pt has viewed results via MyChart  

## 2019-03-07 NOTE — Telephone Encounter (Signed)
-----   Message from Biagio Borg, MD sent at 03/05/2019  7:16 PM EDT ----- Left message on MyChart, pt to cont same tx   Allante Whitmire to please inform pt -COVID neg

## 2019-03-09 ENCOUNTER — Encounter: Payer: Self-pay | Admitting: Gastroenterology

## 2019-03-11 ENCOUNTER — Telehealth: Payer: Self-pay | Admitting: *Deleted

## 2019-03-11 ENCOUNTER — Telehealth: Payer: Self-pay

## 2019-03-11 ENCOUNTER — Ambulatory Visit: Payer: BC Managed Care – PPO | Admitting: Internal Medicine

## 2019-03-11 ENCOUNTER — Ambulatory Visit (INDEPENDENT_AMBULATORY_CARE_PROVIDER_SITE_OTHER): Payer: BC Managed Care – PPO | Admitting: Internal Medicine

## 2019-03-11 ENCOUNTER — Other Ambulatory Visit: Payer: Self-pay

## 2019-03-11 ENCOUNTER — Inpatient Hospital Stay (HOSPITAL_COMMUNITY)
Admission: EM | Admit: 2019-03-11 | Discharge: 2019-03-13 | DRG: 638 | Disposition: A | Discharge status: Home / Self Care | Payer: BC Managed Care – PPO | Attending: Internal Medicine | Admitting: Internal Medicine

## 2019-03-11 ENCOUNTER — Encounter: Payer: Self-pay | Admitting: Internal Medicine

## 2019-03-11 ENCOUNTER — Emergency Department (HOSPITAL_COMMUNITY): Payer: BC Managed Care – PPO

## 2019-03-11 ENCOUNTER — Other Ambulatory Visit: Payer: BC Managed Care – PPO

## 2019-03-11 ENCOUNTER — Encounter (HOSPITAL_COMMUNITY): Payer: Self-pay | Admitting: Emergency Medicine

## 2019-03-11 VITALS — BP 122/90 | HR 126 | Temp 98.4°F | Ht 64.0 in | Wt 375.0 lb

## 2019-03-11 DIAGNOSIS — E785 Hyperlipidemia, unspecified: Secondary | ICD-10-CM | POA: Diagnosis present

## 2019-03-11 DIAGNOSIS — G43909 Migraine, unspecified, not intractable, without status migrainosus: Secondary | ICD-10-CM | POA: Diagnosis not present

## 2019-03-11 DIAGNOSIS — R0600 Dyspnea, unspecified: Secondary | ICD-10-CM | POA: Diagnosis present

## 2019-03-11 DIAGNOSIS — Z20828 Contact with and (suspected) exposure to other viral communicable diseases: Secondary | ICD-10-CM | POA: Diagnosis present

## 2019-03-11 DIAGNOSIS — R0609 Other forms of dyspnea: Secondary | ICD-10-CM | POA: Diagnosis not present

## 2019-03-11 DIAGNOSIS — E8881 Metabolic syndrome: Secondary | ICD-10-CM | POA: Diagnosis present

## 2019-03-11 DIAGNOSIS — Z8249 Family history of ischemic heart disease and other diseases of the circulatory system: Secondary | ICD-10-CM

## 2019-03-11 DIAGNOSIS — Z833 Family history of diabetes mellitus: Secondary | ICD-10-CM

## 2019-03-11 DIAGNOSIS — E876 Hypokalemia: Secondary | ICD-10-CM

## 2019-03-11 DIAGNOSIS — Z79899 Other long term (current) drug therapy: Secondary | ICD-10-CM

## 2019-03-11 DIAGNOSIS — K219 Gastro-esophageal reflux disease without esophagitis: Secondary | ICD-10-CM | POA: Diagnosis present

## 2019-03-11 DIAGNOSIS — D649 Anemia, unspecified: Secondary | ICD-10-CM | POA: Diagnosis present

## 2019-03-11 DIAGNOSIS — R0602 Shortness of breath: Secondary | ICD-10-CM | POA: Insufficient documentation

## 2019-03-11 DIAGNOSIS — R634 Abnormal weight loss: Secondary | ICD-10-CM | POA: Insufficient documentation

## 2019-03-11 DIAGNOSIS — I10 Essential (primary) hypertension: Secondary | ICD-10-CM | POA: Diagnosis not present

## 2019-03-11 DIAGNOSIS — Z6841 Body Mass Index (BMI) 40.0 and over, adult: Secondary | ICD-10-CM

## 2019-03-11 DIAGNOSIS — R350 Frequency of micturition: Secondary | ICD-10-CM | POA: Diagnosis not present

## 2019-03-11 DIAGNOSIS — E1110 Type 2 diabetes mellitus with ketoacidosis without coma: Secondary | ICD-10-CM | POA: Diagnosis not present

## 2019-03-11 DIAGNOSIS — E11649 Type 2 diabetes mellitus with hypoglycemia without coma: Secondary | ICD-10-CM | POA: Diagnosis present

## 2019-03-11 DIAGNOSIS — B379 Candidiasis, unspecified: Secondary | ICD-10-CM | POA: Diagnosis not present

## 2019-03-11 LAB — CBC WITH DIFFERENTIAL/PLATELET
Abs Immature Granulocytes: 0.06 10*3/uL (ref 0.00–0.07)
Basophils Absolute: 0 10*3/uL (ref 0.0–0.1)
Basophils Relative: 1 %
Eosinophils Absolute: 0.1 10*3/uL (ref 0.0–0.5)
Eosinophils Relative: 1 %
HCT: 36.4 % (ref 36.0–46.0)
Hemoglobin: 12.6 g/dL (ref 12.0–15.0)
Immature Granulocytes: 1 %
Lymphocytes Relative: 33 %
Lymphs Abs: 2.9 10*3/uL (ref 0.7–4.0)
MCH: 27.7 pg (ref 26.0–34.0)
MCHC: 34.6 g/dL (ref 30.0–36.0)
MCV: 80 fL (ref 80.0–100.0)
Monocytes Absolute: 0.9 10*3/uL (ref 0.1–1.0)
Monocytes Relative: 11 %
Neutro Abs: 4.7 10*3/uL (ref 1.7–7.7)
Neutrophils Relative %: 53 %
Platelets: 465 10*3/uL — ABNORMAL HIGH (ref 150–400)
RBC: 4.55 MIL/uL (ref 3.87–5.11)
RDW: 18.2 % — ABNORMAL HIGH (ref 11.5–15.5)
WBC: 8.8 10*3/uL (ref 4.0–10.5)
nRBC: 0 % (ref 0.0–0.2)

## 2019-03-11 LAB — URINALYSIS, ROUTINE W REFLEX MICROSCOPIC
Bilirubin Urine: NEGATIVE
Glucose, UA: >=500 mg/dL — AB
Ketones, ur: 80 mg/dL — AB
Leukocytes,Ua: NEGATIVE
Nitrite: NEGATIVE
Protein, ur: NEGATIVE mg/dL
Specific Gravity, Urine: 1.035 — ABNORMAL HIGH (ref 1.005–1.030)
pH: 6 (ref 5.0–8.0)

## 2019-03-11 LAB — CBG MONITORING, ED
Glucose-Capillary: 592 mg/dL (ref 70–99)
Glucose-Capillary: 371 mg/dL — ABNORMAL HIGH (ref 70–99)

## 2019-03-11 LAB — TSH: TSH: 5.44 u[IU]/mL — ABNORMAL HIGH (ref 0.35–4.50)

## 2019-03-11 LAB — COMPREHENSIVE METABOLIC PANEL
ALT: 21 U/L (ref 0–35)
ALT: 27 U/L (ref 0–44)
AST: 18 U/L (ref 0–37)
AST: 25 U/L (ref 15–41)
Albumin: 3.9 g/dL (ref 3.5–5.2)
Albumin: 3.6 g/dL (ref 3.5–5.0)
Alkaline Phosphatase: 83 U/L (ref 39–117)
Alkaline Phosphatase: 85 U/L (ref 38–126)
Anion gap: 19 — ABNORMAL HIGH (ref 5–15)
BUN: 6 mg/dL (ref 6–23)
BUN: <5 mg/dL — ABNORMAL LOW (ref 6–20)
CO2: 13 mEq/L — ABNORMAL LOW (ref 19–32)
CO2: 14 mmol/L — ABNORMAL LOW (ref 22–32)
Calcium: 9.4 mg/dL (ref 8.4–10.5)
Calcium: 9.7 mg/dL (ref 8.9–10.3)
Chloride: 96 mEq/L (ref 96–112)
Chloride: 99 mmol/L (ref 98–111)
Creatinine, Ser: 1 mg/dL (ref 0.40–1.20)
Creatinine, Ser: 1.14 mg/dL — ABNORMAL HIGH (ref 0.44–1.00)
GFR calc Af Amer: >60 mL/min (ref >60)
GFR calc non Af Amer: >60 mL/min (ref >60)
GFR: 76.04 mL/min (ref >60.00)
Glucose, Bld: 583 mg/dL (ref 70–99)
Glucose, Bld: 588 mg/dL (ref 70–99)
Potassium: 3.6 mEq/L (ref 3.5–5.1)
Potassium: 3.5 mmol/L (ref 3.5–5.1)
Sodium: 129 mEq/L — ABNORMAL LOW (ref 135–145)
Sodium: 132 mmol/L — ABNORMAL LOW (ref 135–145)
Total Bilirubin: 0.7 mg/dL (ref 0.2–1.2)
Total Bilirubin: 2.1 mg/dL — ABNORMAL HIGH (ref 0.3–1.2)
Total Protein: 7.1 g/dL (ref 6.0–8.3)
Total Protein: 6.7 g/dL (ref 6.5–8.1)

## 2019-03-11 LAB — POCT I-STAT EG7
Acid-base deficit: 10 mmol/L — ABNORMAL HIGH (ref 0.0–2.0)
Bicarbonate: 14 mmol/L — ABNORMAL LOW (ref 20.0–28.0)
Calcium, Ion: 1.28 mmol/L (ref 1.15–1.40)
HCT: 37 % (ref 36.0–46.0)
Hemoglobin: 12.6 g/dL (ref 12.0–15.0)
O2 Saturation: 95 %
Potassium: 3.4 mmol/L — ABNORMAL LOW (ref 3.5–5.1)
Sodium: 133 mmol/L — ABNORMAL LOW (ref 135–145)
TCO2: 15 mmol/L — ABNORMAL LOW (ref 22–32)
pCO2, Ven: 25 mmHg — ABNORMAL LOW (ref 44.0–60.0)
pH, Ven: 7.356 (ref 7.250–7.430)
pO2, Ven: 79 mmHg — ABNORMAL HIGH (ref 32.0–45.0)

## 2019-03-11 LAB — CBC
HCT: 39.1 % (ref 36.0–46.0)
Hemoglobin: 12.9 g/dL (ref 12.0–15.0)
MCHC: 32.9 g/dL (ref 30.0–36.0)
MCV: 83.1 fl (ref 78.0–100.0)
Platelets: 477 10*3/uL — ABNORMAL HIGH (ref 150.0–400.0)
RBC: 4.7 Mil/uL (ref 3.87–5.11)
RDW: 18.6 % — ABNORMAL HIGH (ref 11.5–15.5)
WBC: 9.5 10*3/uL (ref 4.0–10.5)

## 2019-03-11 LAB — LIPID PANEL
Cholesterol: 181 mg/dL (ref 0–200)
HDL: 25.5 mg/dL — ABNORMAL LOW (ref >39.00)
Total CHOL/HDL Ratio: 7
Triglycerides: 1086 mg/dL — ABNORMAL HIGH (ref 0.0–149.0)

## 2019-03-11 LAB — BRAIN NATRIURETIC PEPTIDE: Pro B Natriuretic peptide (BNP): 18 pg/mL (ref 0.0–100.0)

## 2019-03-11 LAB — PREGNANCY, URINE: Preg Test, Ur: NEGATIVE

## 2019-03-11 LAB — TROPONIN I (HIGH SENSITIVITY): High Sens Troponin I: 10 ng/L (ref 2–17)

## 2019-03-11 LAB — LDL CHOLESTEROL, DIRECT: Direct LDL: 27 mg/dL

## 2019-03-11 LAB — POCT GLYCOSYLATED HEMOGLOBIN (HGB A1C): Hemoglobin A1C: 10.5 % — AB (ref 4.0–5.6)

## 2019-03-11 LAB — GLUCOSE, CAPILLARY: Glucose-Capillary: 331 mg/dL — ABNORMAL HIGH (ref 70–99)

## 2019-03-11 MED ORDER — FLUCONAZOLE 150 MG PO TABS: 150.0000 mg | ORAL_TABLET | ORAL | 0 refills | Status: DC

## 2019-03-11 MED ORDER — LOSARTAN POTASSIUM 50 MG PO TABS
100.0000 mg | ORAL_TABLET | Freq: Every day | ORAL | Status: DC
  Administered 2019-03-12 – 2019-03-13 (×2): 100 mg via ORAL
  Filled 2019-03-11 (×2): qty 2

## 2019-03-11 MED ORDER — ATENOLOL 50 MG PO TABS
50.0000 mg | ORAL_TABLET | Freq: Every day | ORAL | Status: DC
  Administered 2019-03-12: 50 mg via ORAL
  Filled 2019-03-11: qty 1

## 2019-03-11 MED ORDER — TOPIRAMATE 25 MG PO TABS
50.0000 mg | ORAL_TABLET | Freq: Two times a day (BID) | ORAL | Status: DC
  Administered 2019-03-12 – 2019-03-13 (×4): 50 mg via ORAL
  Filled 2019-03-11 (×4): qty 2

## 2019-03-11 MED ORDER — LORATADINE 10 MG PO TABS
10.0000 mg | ORAL_TABLET | Freq: Every day | ORAL | Status: DC
  Administered 2019-03-12 – 2019-03-13 (×2): 10 mg via ORAL
  Filled 2019-03-11 (×2): qty 1

## 2019-03-11 MED ORDER — LACTATED RINGERS IV BOLUS
1000.0000 mL | Freq: Once | INTRAVENOUS | Status: AC
  Administered 2019-03-11: 1000 mL via INTRAVENOUS

## 2019-03-11 MED ORDER — ONDANSETRON HCL 4 MG/2ML IJ SOLN
4.0000 mg | Freq: Once | INTRAMUSCULAR | Status: AC
  Administered 2019-03-11: 4 mg via INTRAVENOUS
  Filled 2019-03-11: qty 2

## 2019-03-11 MED ORDER — FLUTICASONE PROPIONATE 50 MCG/ACT NA SUSP
2.0000 | Freq: Every day | NASAL | Status: DC
  Administered 2019-03-13: 2 via NASAL
  Filled 2019-03-11: qty 16

## 2019-03-11 MED ORDER — METFORMIN HCL 1000 MG PO TABS: 1000.0000 mg | ORAL_TABLET | Freq: Two times a day (BID) | ORAL | 3 refills | Status: DC

## 2019-03-11 MED ORDER — ENOXAPARIN SODIUM 100 MG/ML ~~LOC~~ SOLN
85.0000 mg | Freq: Every day | SUBCUTANEOUS | Status: DC
  Administered 2019-03-12: 85 mg via SUBCUTANEOUS
  Filled 2019-03-11: qty 1

## 2019-03-11 MED ORDER — INSULIN REGULAR(HUMAN) IN NACL 100-0.9 UT/100ML-% IV SOLN
INTRAVENOUS | Status: DC
  Administered 2019-03-12: 5.4 [IU]/h via INTRAVENOUS
  Filled 2019-03-11: qty 100

## 2019-03-11 MED ORDER — DEXTROSE-NACL 5-0.45 % IV SOLN
INTRAVENOUS | Status: DC
  Administered 2019-03-12: 03:00:00 via INTRAVENOUS

## 2019-03-11 MED ORDER — PANTOPRAZOLE SODIUM 40 MG PO TBEC
40.0000 mg | DELAYED_RELEASE_TABLET | Freq: Every day | ORAL | Status: DC
  Administered 2019-03-12 – 2019-03-13 (×2): 40 mg via ORAL
  Filled 2019-03-11 (×2): qty 1

## 2019-03-11 MED ORDER — POTASSIUM CHLORIDE 10 MEQ/100ML IV SOLN
10.0000 meq | INTRAVENOUS | Status: AC
  Administered 2019-03-11 (×2): 10 meq via INTRAVENOUS
  Filled 2019-03-11 (×2): qty 100

## 2019-03-11 MED ORDER — INSULIN REGULAR(HUMAN) IN NACL 100-0.9 UT/100ML-% IV SOLN
INTRAVENOUS | Status: DC
  Administered 2019-03-11: 5.3 [IU]/h via INTRAVENOUS
  Filled 2019-03-11: qty 100

## 2019-03-11 MED ORDER — SODIUM CHLORIDE 0.9 % IV SOLN
INTRAVENOUS | Status: DC
  Administered 2019-03-11: via INTRAVENOUS

## 2019-03-11 NOTE — Telephone Encounter (Signed)
Pharmacy informed.

## 2019-03-11 NOTE — Assessment & Plan Note (Signed)
HgA1c checked in office which was 10.5 which is likely explanation. Will work on sugar control to help alleviate symptoms and see if weight stabilizes.

## 2019-03-11 NOTE — Telephone Encounter (Signed)
Patient's glucose is critical at 583.
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
## 2019-03-11 NOTE — Progress Notes (Signed)
   Subjective:   Patient ID: Emily Phelps, female    DOB: 06-16-1981, 38 y.o.   MRN: [id number]  HPI The patient is a 38 YO female coming in for increased thirst (drinking a lot of fluids and urinating a lot, denies burning with urination, is down about 50 pounds, is also having nausea, stomach pain, some SOB with walking) and weight loss (down 50 pounds in the last 2 months, is eating a little better than usual per her reports, is having nausea and stomach pain) and vaginal yeast infection (going on for several weeks, tried otc which helped a little) and SOB with walking (in the last several weeks also, SOB with walking short distances, denies chest pains, denies sweating, as above for other HPI).   Review of Systems  Constitutional: Positive for activity change, appetite change, fatigue and unexpected weight change. Negative for chills, diaphoresis and fever.  HENT: Negative.   Eyes: Negative.   Respiratory: Negative for cough, chest tightness and shortness of breath.   Cardiovascular: Negative for chest pain, palpitations and leg swelling.  Gastrointestinal: Positive for abdominal distention, abdominal pain and nausea. Negative for anal bleeding, blood in stool, constipation, diarrhea, rectal pain and vomiting.  Endocrine: Positive for polydipsia and polyuria.  Genitourinary: Positive for frequency.  Musculoskeletal: Negative.   Skin: Negative.   Neurological: Negative.   Psychiatric/Behavioral: Negative.     Objective:  Physical Exam Constitutional:      Appearance: She is well-developed. She is obese.  HENT:     Head: Normocephalic and atraumatic.  Neck:     Musculoskeletal: Normal range of motion.  Cardiovascular:     Rate and Rhythm: Normal rate and regular rhythm.  Pulmonary:     Effort: Pulmonary effort is normal. No respiratory distress.     Breath sounds: Normal breath sounds. No wheezing or rales.  Abdominal:     General: Bowel sounds are normal. There is no  distension.     Palpations: Abdomen is soft.     Tenderness: There is abdominal tenderness. There is no rebound.     Comments: Mild tenderness diffuse, no rebound or guarding  Skin:    General: Skin is warm and dry.  Neurological:     Mental Status: She is alert and oriented to person, place, and time.     Coordination: Coordination normal.     Vitals:   03/11/19 1039  BP: 122/90  Pulse: (!) 126  Temp: 98.4 F (36.9 C)  TempSrc: Oral  SpO2: 98%  Weight: (!) 375 lb (170.1 kg)  Height: 5\' 4"  (1.626 m)   HgA1c 10.5  EKG: Rate 129, axis normal, interval okay, sinus tachy, no st or t wave changes, no prior to compare  Assessment & Plan:  Visit time 40 minutes: greater than 50% of that time was spent in face to face counseling and coordination of care with the patient: counseled about new diabetes, EKG, workup and potential triggers for new diabetes as well as potential AG which could cause ER trip for this today.

## 2019-03-11 NOTE — ED Triage Notes (Signed)
Pt to ED with c/o hyperglycemia   Pt does not have hx of diabetes.  CBG in triage 592

## 2019-03-11 NOTE — Telephone Encounter (Signed)
Noted will await lab results in epic

## 2019-03-11 NOTE — Telephone Encounter (Signed)
Copied from [place] [phone]. Topic: General - Other >> Mar 11, 2019 12:21 PM Mathis Bud wrote: Reason for CRM: Juliann Pulse from [facility] called about a med fluconazole (DIFLUCAN) 150 MG tablet it counteracts with another medications she is on.  Please call pharmacy back  [phone]
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
## 2019-03-11 NOTE — Telephone Encounter (Signed)
Okay to take with her meds

## 2019-03-11 NOTE — Patient Instructions (Addendum)
You do have likely new diabetes. Your HgA1c is 10.2 which means that your average blood sugar is around 400. Normal is 100-150. This is likely causing the increased thirst, peeing a lot, nausea, stomach pain, weight loss.   It is possible that the weight gain recently triggered the sugars to go up. It is unclear if this will go down or if you will have diabetes from now on.   We have sent in diflucan to take 1 pill today, 1 pill Monday, 1 pill Thursday to help clear the yeast infection.   The EKG of the heart looks fine. We are checking lab work to make sure there is not another cause of the sugars going up.   We have sent in a medicine called metformin to help lower the sugars. Take 1 pill daily for the first week, then take 1 pill twice a day after that.    Diabetes Mellitus and Nutrition, Adult When you have diabetes (diabetes mellitus), it is very important to have healthy eating habits because your blood sugar (glucose) levels are greatly affected by what you eat and drink. Eating healthy foods in the appropriate amounts, at about the same times every day, can help you:  Control your blood glucose.  Lower your risk of heart disease.  Improve your blood pressure.  Reach or maintain a healthy weight. Every person with diabetes is different, and each person has different needs for a meal plan. Your health care provider may recommend that you work with a diet and nutrition specialist (dietitian) to make a meal plan that is best for you. Your meal plan may vary depending on factors such as:  The calories you need.  The medicines you take.  Your weight.  Your blood glucose, blood pressure, and cholesterol levels.  Your activity level.  Other health conditions you have, such as heart or kidney disease. How do carbohydrates affect me? Carbohydrates, also called carbs, affect your blood glucose level more than any other type of food. Eating carbs naturally raises the amount of glucose  in your blood. Carb counting is a method for keeping track of how many carbs you eat. Counting carbs is important to keep your blood glucose at a healthy level, especially if you use insulin or take certain oral diabetes medicines. It is important to know how many carbs you can safely have in each meal. This is different for every person. Your dietitian can help you calculate how many carbs you should have at each meal and for each snack. Foods that contain carbs include:  Bread, cereal, rice, pasta, and crackers.  Potatoes and corn.  Peas, beans, and lentils.  Milk and yogurt.  Fruit and juice.  Desserts, such as cakes, cookies, ice cream, and candy. How does alcohol affect me? Alcohol can cause a sudden decrease in blood glucose (hypoglycemia), especially if you use insulin or take certain oral diabetes medicines. Hypoglycemia can be a life-threatening condition. Symptoms of hypoglycemia (sleepiness, dizziness, and confusion) are similar to symptoms of having too much alcohol. If your health care provider says that alcohol is safe for you, follow these guidelines:  Limit alcohol intake to no more than 1 drink per day for nonpregnant women and 2 drinks per day for men. One drink equals 12 oz of beer, 5 oz of wine, or 1 oz of hard liquor.  Do not drink on an empty stomach.  Keep yourself hydrated with water, diet soda, or unsweetened iced tea.  Keep in mind that  regular soda, juice, and other mixers may contain a lot of sugar and must be counted as carbs. What are tips for following this plan?  Reading food labels  Start by checking the serving size on the "Nutrition Facts" label of packaged foods and drinks. The amount of calories, carbs, fats, and other nutrients listed on the label is based on one serving of the item. Many items contain more than one serving per package.  Check the total grams (g) of carbs in one serving. You can calculate the number of servings of carbs in one  serving by dividing the total carbs by 15. For example, if a food has 30 g of total carbs, it would be equal to 2 servings of carbs.  Check the number of grams (g) of saturated and trans fats in one serving. Choose foods that have low or no amount of these fats.  Check the number of milligrams (mg) of salt (sodium) in one serving. Most people should limit total sodium intake to less than 2,300 mg per day.  Always check the nutrition information of foods labeled as "low-fat" or "nonfat". These foods may be higher in added sugar or refined carbs and should be avoided.  Talk to your dietitian to identify your daily goals for nutrients listed on the label. Shopping  Avoid buying canned, premade, or processed foods. These foods tend to be high in fat, sodium, and added sugar.  Shop around the outside edge of the grocery store. This includes fresh fruits and vegetables, bulk grains, fresh meats, and fresh dairy. Cooking  Use low-heat cooking methods, such as baking, instead of high-heat cooking methods like deep frying.  Cook using healthy oils, such as olive, canola, or sunflower oil.  Avoid cooking with butter, cream, or high-fat meats. Meal planning  Eat meals and snacks regularly, preferably at the same times every day. Avoid going long periods of time without eating.  Eat foods high in fiber, such as fresh fruits, vegetables, beans, and whole grains. Talk to your dietitian about how many servings of carbs you can eat at each meal.  Eat 4-6 ounces (oz) of lean protein each day, such as lean meat, chicken, fish, eggs, or tofu. One oz of lean protein is equal to: ? 1 oz of meat, chicken, or fish. ? 1 egg. ?  cup of tofu.  Eat some foods each day that contain healthy fats, such as avocado, nuts, seeds, and fish. Lifestyle  Check your blood glucose regularly.  Exercise regularly as told by your health care provider. This may include: ? 150 minutes of moderate-intensity or  vigorous-intensity exercise each week. This could be brisk walking, biking, or water aerobics. ? Stretching and doing strength exercises, such as yoga or weightlifting, at least 2 times a week.  Take medicines as told by your health care provider.  Do not use any products that contain nicotine or tobacco, such as cigarettes and e-cigarettes. If you need help quitting, ask your health care provider.  Work with a Social worker or diabetes educator to identify strategies to manage stress and any emotional and social challenges. Questions to ask a health care provider  Do I need to meet with a diabetes educator?  Do I need to meet with a dietitian?  What number can I call if I have questions?  When are the best times to check my blood glucose? Where to find more information:  American Diabetes Association: diabetes.org  Academy of Nutrition and Dietetics: www.eatright.[employer]  of Diabetes and Digestive and Kidney Diseases (NIH): [employer] Summary  A healthy meal plan will help you control your blood glucose and maintain a healthy lifestyle.  Working with a diet and nutrition specialist (dietitian) can help you make a meal plan that is best for you.  Keep in mind that carbohydrates (carbs) and alcohol have immediate effects on your blood glucose levels. It is important to count carbs and to use alcohol carefully. This information is not intended to replace advice given to you by your health care provider. Make sure you discuss any questions you have with your health care provider. Document Released: 03/27/2005 Document Revised: 06/12/2017 Document Reviewed: 08/04/2016 Elsevier Patient Education  2020 [employer].

## 2019-03-11 NOTE — Assessment & Plan Note (Signed)
Worrisome given weight and sudden change in sugars. EKG is tachycardia but no overt changes. She is higher than average risk for CAD so checking BNP and troponin. If symptoms are ongoing once sugars down may need cardiology evaluation.

## 2019-03-11 NOTE — Assessment & Plan Note (Signed)
Worrisome for diabetes. HgA1c done in office 10.5. Checking CMP today looking for AG due to symptoms of nausea, abdominal pain, weight loss. If present counseled that she may have to go to ER or urgent care for fluids and insulin. Rx for metformin today 1000 mg BID to start daily 1 week, then BID. Unclear trigger as her weight only increased about 10 pounds compared to HgA1c 5.5 in March. She has lost 50 pounds likely due to sugar diuresis.

## 2019-03-11 NOTE — ED Notes (Signed)
Gave pt water 

## 2019-03-12 DIAGNOSIS — E8881 Metabolic syndrome: Secondary | ICD-10-CM | POA: Diagnosis present

## 2019-03-12 DIAGNOSIS — B379 Candidiasis, unspecified: Secondary | ICD-10-CM | POA: Diagnosis present

## 2019-03-12 DIAGNOSIS — E876 Hypokalemia: Secondary | ICD-10-CM | POA: Diagnosis present

## 2019-03-12 DIAGNOSIS — Z20828 Contact with and (suspected) exposure to other viral communicable diseases: Secondary | ICD-10-CM | POA: Diagnosis present

## 2019-03-12 DIAGNOSIS — E11649 Type 2 diabetes mellitus with hypoglycemia without coma: Secondary | ICD-10-CM | POA: Diagnosis present

## 2019-03-12 DIAGNOSIS — E1110 Type 2 diabetes mellitus with ketoacidosis without coma: Secondary | ICD-10-CM | POA: Diagnosis present

## 2019-03-12 DIAGNOSIS — I10 Essential (primary) hypertension: Secondary | ICD-10-CM | POA: Diagnosis present

## 2019-03-12 DIAGNOSIS — E785 Hyperlipidemia, unspecified: Secondary | ICD-10-CM | POA: Diagnosis present

## 2019-03-12 DIAGNOSIS — Z6841 Body Mass Index (BMI) 40.0 and over, adult: Secondary | ICD-10-CM | POA: Diagnosis not present

## 2019-03-12 DIAGNOSIS — D649 Anemia, unspecified: Secondary | ICD-10-CM | POA: Diagnosis present

## 2019-03-12 DIAGNOSIS — K219 Gastro-esophageal reflux disease without esophagitis: Secondary | ICD-10-CM | POA: Diagnosis present

## 2019-03-12 DIAGNOSIS — G43909 Migraine, unspecified, not intractable, without status migrainosus: Secondary | ICD-10-CM | POA: Diagnosis present

## 2019-03-12 DIAGNOSIS — Z833 Family history of diabetes mellitus: Secondary | ICD-10-CM | POA: Diagnosis not present

## 2019-03-12 DIAGNOSIS — Z8249 Family history of ischemic heart disease and other diseases of the circulatory system: Secondary | ICD-10-CM | POA: Diagnosis not present

## 2019-03-12 DIAGNOSIS — Z79899 Other long term (current) drug therapy: Secondary | ICD-10-CM | POA: Diagnosis not present

## 2019-03-12 LAB — HIV ANTIBODY (ROUTINE TESTING W REFLEX): HIV Screen 4th Generation wRfx: NONREACTIVE

## 2019-03-12 LAB — BASIC METABOLIC PANEL
Anion gap: 17 — ABNORMAL HIGH (ref 5–15)
Anion gap: 15 (ref 5–15)
Anion gap: 12 (ref 5–15)
Anion gap: 12 (ref 5–15)
BUN: <5 mg/dL — ABNORMAL LOW (ref 6–20)
BUN: <5 mg/dL — ABNORMAL LOW (ref 6–20)
BUN: <5 mg/dL — ABNORMAL LOW (ref 6–20)
BUN: <5 mg/dL — ABNORMAL LOW (ref 6–20)
CO2: 17 mmol/L — ABNORMAL LOW (ref 22–32)
CO2: 16 mmol/L — ABNORMAL LOW (ref 22–32)
CO2: 20 mmol/L — ABNORMAL LOW (ref 22–32)
CO2: 19 mmol/L — ABNORMAL LOW (ref 22–32)
Calcium: 9.3 mg/dL (ref 8.9–10.3)
Calcium: 9.3 mg/dL (ref 8.9–10.3)
Calcium: 9.2 mg/dL (ref 8.9–10.3)
Calcium: 9.7 mg/dL (ref 8.9–10.3)
Chloride: 100 mmol/L (ref 98–111)
Chloride: 104 mmol/L (ref 98–111)
Chloride: 103 mmol/L (ref 98–111)
Chloride: 102 mmol/L (ref 98–111)
Creatinine, Ser: 1.01 mg/dL — ABNORMAL HIGH (ref 0.44–1.00)
Creatinine, Ser: 0.89 mg/dL (ref 0.44–1.00)
Creatinine, Ser: 0.85 mg/dL (ref 0.44–1.00)
Creatinine, Ser: 0.71 mg/dL (ref 0.44–1.00)
GFR calc Af Amer: >60 mL/min (ref >60)
GFR calc Af Amer: >60 mL/min (ref >60)
GFR calc Af Amer: >60 mL/min (ref >60)
GFR calc Af Amer: >60 mL/min (ref >60)
GFR calc non Af Amer: >60 mL/min (ref >60)
GFR calc non Af Amer: >60 mL/min (ref >60)
GFR calc non Af Amer: >60 mL/min (ref >60)
GFR calc non Af Amer: >60 mL/min (ref >60)
Glucose, Bld: 327 mg/dL — ABNORMAL HIGH (ref 70–99)
Glucose, Bld: 261 mg/dL — ABNORMAL HIGH (ref 70–99)
Glucose, Bld: 199 mg/dL — ABNORMAL HIGH (ref 70–99)
Glucose, Bld: 210 mg/dL — ABNORMAL HIGH (ref 70–99)
Potassium: 3.2 mmol/L — ABNORMAL LOW (ref 3.5–5.1)
Potassium: 2.9 mmol/L — ABNORMAL LOW (ref 3.5–5.1)
Potassium: 3.1 mmol/L — ABNORMAL LOW (ref 3.5–5.1)
Potassium: 3.1 mmol/L — ABNORMAL LOW (ref 3.5–5.1)
Sodium: 134 mmol/L — ABNORMAL LOW (ref 135–145)
Sodium: 135 mmol/L (ref 135–145)
Sodium: 135 mmol/L (ref 135–145)
Sodium: 133 mmol/L — ABNORMAL LOW (ref 135–145)

## 2019-03-12 LAB — CBC
HCT: 33.6 % — ABNORMAL LOW (ref 36.0–46.0)
Hemoglobin: 11.8 g/dL — ABNORMAL LOW (ref 12.0–15.0)
MCH: 27.8 pg (ref 26.0–34.0)
MCHC: 35.1 g/dL (ref 30.0–36.0)
MCV: 79.1 fL — ABNORMAL LOW (ref 80.0–100.0)
Platelets: 385 10*3/uL (ref 150–400)
RBC: 4.25 MIL/uL (ref 3.87–5.11)
RDW: 18 % — ABNORMAL HIGH (ref 11.5–15.5)
WBC: 8.7 10*3/uL (ref 4.0–10.5)
nRBC: 0 % (ref 0.0–0.2)

## 2019-03-12 LAB — GLUCOSE, CAPILLARY
Glucose-Capillary: 307 mg/dL — ABNORMAL HIGH (ref 70–99)
Glucose-Capillary: 262 mg/dL — ABNORMAL HIGH (ref 70–99)
Glucose-Capillary: 215 mg/dL — ABNORMAL HIGH (ref 70–99)
Glucose-Capillary: 208 mg/dL — ABNORMAL HIGH (ref 70–99)
Glucose-Capillary: 229 mg/dL — ABNORMAL HIGH (ref 70–99)
Glucose-Capillary: 201 mg/dL — ABNORMAL HIGH (ref 70–99)
Glucose-Capillary: 187 mg/dL — ABNORMAL HIGH (ref 70–99)
Glucose-Capillary: 183 mg/dL — ABNORMAL HIGH (ref 70–99)
Glucose-Capillary: 162 mg/dL — ABNORMAL HIGH (ref 70–99)
Glucose-Capillary: 173 mg/dL — ABNORMAL HIGH (ref 70–99)
Glucose-Capillary: 298 mg/dL — ABNORMAL HIGH (ref 70–99)
Glucose-Capillary: 332 mg/dL — ABNORMAL HIGH (ref 70–99)

## 2019-03-12 LAB — TROPONIN I (HIGH SENSITIVITY)
Troponin I (High Sensitivity): 12 ng/L (ref <18)
Troponin I (High Sensitivity): 12 ng/L (ref <18)

## 2019-03-12 LAB — BRAIN NATRIURETIC PEPTIDE: B Natriuretic Peptide: 68.3 pg/mL (ref 0.0–100.0)

## 2019-03-12 LAB — D-DIMER, QUANTITATIVE: D-Dimer, Quant: 0.41 ug/mL-FEU (ref 0.00–0.50)

## 2019-03-12 LAB — SARS CORONAVIRUS 2 (TAT 6-24 HRS): SARS Coronavirus 2: NEGATIVE

## 2019-03-12 LAB — MRSA PCR SCREENING: MRSA by PCR: NEGATIVE

## 2019-03-12 LAB — HCG, SERUM, QUALITATIVE: Preg, Serum: NEGATIVE

## 2019-03-12 MED ORDER — INSULIN STARTER KIT- PEN NEEDLES (ENGLISH)
1.0000 | Freq: Once | Status: DC
  Filled 2019-03-12: qty 1

## 2019-03-12 MED ORDER — HEPARIN SODIUM (PORCINE) 5000 UNIT/ML IJ SOLN
5000.0000 [IU] | Freq: Three times a day (TID) | INTRAMUSCULAR | Status: DC
  Administered 2019-03-13: 5000 [IU] via SUBCUTANEOUS
  Filled 2019-03-12: qty 1

## 2019-03-12 MED ORDER — ACETAMINOPHEN 325 MG PO TABS
650.0000 mg | ORAL_TABLET | Freq: Four times a day (QID) | ORAL | Status: DC | PRN
  Administered 2019-03-12: 650 mg via ORAL
  Filled 2019-03-12: qty 2

## 2019-03-12 MED ORDER — FLUCONAZOLE 150 MG PO TABS
150.0000 mg | ORAL_TABLET | Freq: Every day | ORAL | Status: DC
  Administered 2019-03-12 – 2019-03-13 (×2): 150 mg via ORAL
  Filled 2019-03-12 (×2): qty 1

## 2019-03-12 MED ORDER — POTASSIUM CHLORIDE 10 MEQ/100ML IV SOLN
10.0000 meq | INTRAVENOUS | Status: AC
  Administered 2019-03-12 (×3): 10 meq via INTRAVENOUS
  Filled 2019-03-12 (×3): qty 100

## 2019-03-12 MED ORDER — POTASSIUM CHLORIDE CRYS ER 20 MEQ PO TBCR
40.0000 meq | EXTENDED_RELEASE_TABLET | ORAL | Status: AC
  Administered 2019-03-12 (×2): 40 meq via ORAL
  Filled 2019-03-12 (×2): qty 2

## 2019-03-12 MED ORDER — HYDRALAZINE HCL 20 MG/ML IJ SOLN: 10.0000 mg | INTRAMUSCULAR | Status: DC | PRN

## 2019-03-12 MED ORDER — GLUCERNA SHAKE PO LIQD
237.0000 mL | Freq: Three times a day (TID) | ORAL | Status: DC
  Administered 2019-03-12 (×2): 237 mL via ORAL

## 2019-03-12 MED ORDER — INSULIN ASPART 100 UNIT/ML ~~LOC~~ SOLN
0.0000 [IU] | Freq: Three times a day (TID) | SUBCUTANEOUS | Status: DC
  Administered 2019-03-12: 8 [IU] via SUBCUTANEOUS
  Administered 2019-03-13 (×2): 11 [IU] via SUBCUTANEOUS

## 2019-03-12 MED ORDER — INSULIN GLARGINE 100 UNIT/ML ~~LOC~~ SOLN
20.0000 [IU] | Freq: Two times a day (BID) | SUBCUTANEOUS | Status: DC
  Administered 2019-03-12: 20 [IU] via SUBCUTANEOUS
  Filled 2019-03-12 (×3): qty 0.2

## 2019-03-12 MED ORDER — INSULIN GLARGINE 100 UNIT/ML ~~LOC~~ SOLN
40.0000 [IU] | Freq: Two times a day (BID) | SUBCUTANEOUS | Status: DC
  Administered 2019-03-12: 40 [IU] via SUBCUTANEOUS
  Filled 2019-03-12 (×2): qty 0.4

## 2019-03-12 MED ORDER — ADULT MULTIVITAMIN W/MINERALS CH
1.0000 | ORAL_TABLET | Freq: Every day | ORAL | Status: DC
  Administered 2019-03-12 – 2019-03-13 (×2): 1 via ORAL
  Filled 2019-03-12 (×2): qty 1

## 2019-03-12 MED ORDER — LIVING WELL WITH DIABETES BOOK
Freq: Once | Status: AC
  Administered 2019-03-12: 18:00:00
  Filled 2019-03-12: qty 1

## 2019-03-12 NOTE — Progress Notes (Signed)
Pt arrived to [facility]. Alert and oriented x 4. Ambulated from stretcher to bed, steady gait. Pt identified appropriately, VS stable, no signs of acute distress.  Pt placed on cardiac monitor and CCMD notified.  Oriented to room and equipment, instructed to call for assistance and how to use call bell. Call bell left within reach. Will continue to monitor and treat pt per MD orders.

## 2019-03-12 NOTE — Progress Notes (Signed)
BMP results available for review, potassium 2.9, pt received 1/3 bags of potassium ordered.  NP on call notified.

## 2019-03-12 NOTE — Discharge Instructions (Signed)
°Carbohydrate Counting For People With Diabetes ° °Why Is Carbohydrate Counting Important? °• Counting carbohydrate servings may help you control your blood glucose level so that you feel better.  °• The balance between the carbohydrates you eat and insulin determines what your blood glucose level will be after eating.  °• Carbohydrate counting can also help you plan your meals. °Which Foods Have Carbohydrates? °Foods with carbohydrates include: °• Breads, crackers, and cereals  °• Pasta, rice, and grains  °• Starchy vegetables, such as potatoes, corn, and peas  °• Beans and legumes  °• Milk, soy milk, and yogurt  °• Fruits and fruit juices  °• Sweets, such as cakes, cookies, ice cream, jam, and jelly °Carbohydrate Servings °In diabetes meal planning, 1 serving of a food with carbohydrate has about 15 grams of carbohydrate: °• Check serving sizes with measuring cups and spoons or a food scale.  °• Read the Nutrition Facts on food labels to find out how many grams of carbohydrate are in foods you eat. °The food lists in this handout show portions that have about 15 grams of carbohydrate. ° °Tips °Meal Planning Tips °• An Eating Plan tells you how many carbohydrate servings to eat at your meals and snacks. For many adults, eating 3 to 5 servings of carbohydrate foods at each meal and 1 or 2 carbohydrate servings for each snack works well.  °• In a healthy daily Eating Plan, most carbohydrates come from:  °• At least 6 servings of fruits and nonstarchy vegetables  °• At least 6 servings of grains, beans, and starchy vegetables, with at least 3 servings from whole grains  °• At least 2 servings of milk or milk products °• Check your blood glucose level regularly. It can tell you if you need to adjust when you eat carbohydrates.  °• Eating foods that have fiber, such as whole grains, and having very few salty foods is good for your health.  °• Eat 4 to 6 ounces of meat or other protein foods (such as soybean burgers)  each day. Choose low-fat sources of protein, such as lean beef, lean pork, chicken, fish, low-fat cheese, or vegetarian foods such as soy.  °• Eat some healthy fats, such as olive oil, canola oil, and nuts.  °• Eat very little saturated fats. These unhealthy fats are found in butter, cream, and high-fat meats, such as bacon and sausage.  °• Eat very little or no trans fats. These unhealthy fats are found in all foods that list “partially hydrogenated oil” as an ingredient.  °Label Reading Tips °The Nutrition Facts panel on a label lists the grams of total carbohydrate in 1 standard serving. The label's standard serving may be larger or smaller than 1 carbohydrate serving. °To figure out how many carbohydrate servings are in the food: °• First, look at the label's standard serving size.  °• Check the grams of total carbohydrate. This is the amount of carbohydrate in 1 standard serving.  °• Divide the grams of total carbohydrate by 15. This number equals the number of carbohydrate servings in 1 standard serving. Remember: 1 carbohydrate serving is 15 grams of carbohydrate.  °• Note: You may ignore the grams of sugars on the Nutrition Facts panel because they are included in the grams of total carbohydrate. ° °Foods Recommended °1 serving = about 15 grams of carbohydrate °Starches °• 1 slice bread (1 ounce)  °• 1 tortilla (6-inch size)  °• ¼ large bagel (1 ounce)  °• 2 taco shells (5-inch   size)  °• ½ hamburger or hot dog bun (¾ ounce)  °• ¾ cup ready-to-eat unsweetened cereal  °• ½ cup cooked cereal  °• 1 cup broth-based soup  °• 4 to 6 small crackers  °• 1/3 cup pasta or rice (cooked)  °• ½ cup beans, peas, corn, sweet potatoes, winter squash, or mashed or boiled potatoes (cooked)  °• ¼ large baked potato (3 ounces)  °• ¾ ounce pretzels, potato chips, or tortilla chips  °• 3 cups popcorn (popped) °Fruit °• 1 small fresh fruit (¾ to 1 cup)  °• ½ cup canned or frozen fruit  °• 2 tablespoons dried fruit (blueberries,  cherries, cranberries, mixed fruit, raisins)  °• 17 small grapes (3 ounces)  °• 1 cup melon or berries  °• ½ cup unsweetened fruit juice °Milk °• 1 cup fat-free or reduced-fat milk  °• 1 cup soy milk  °• 2/3 cup (6 ounces) nonfat yogurt sweetened with sugar-free sweetener °Sweets and Desserts °• 2-inch square cake (unfrosted)  °• 2 small cookies (2/3 ounce)  °• ½ cup ice cream or frozen yogurt  °• ¼ cup sherbet or sorbet  °• 1 tablespoon syrup, jam, jelly, table sugar, or honey  °• 2 tablespoons light syrup °Other Foods °• Count 1 cup raw vegetables or ½ cup cooked nonstarchy vegetables as zero (0) carbohydrate servings or “free” foods. If you eat 3 or more servings at one meal, count them as 1 carbohydrate serving.  °• Foods that have less than 20 calories in each serving also may be counted as zero carbohydrate servings or “free” foods.  °• Count 1 cup of casserole or other mixed foods as 2 carbohydrate servings. ° °Carbohydrate Counting for People with Diabetes Sample 1-Day Menu  °Breakfast 1 extra-small banana (1 carbohydrate serving)  °1 cup low-fat or fat-free milk (1 carbohydrate serving)  °1 slice whole wheat bread (1 carbohydrate serving)  °1 teaspoon margarine  °Lunch 2 ounces turkey slices  °2 slices whole wheat bread (2 carbohydrate servings)  °2 lettuce leaves  °4 celery sticks  °4 carrot sticks  °1 medium apple (1 carbohydrate serving)  °1 cup low-fat or fat-free milk (1 carbohydrate serving)  °Afternoon Snack 2 tablespoons raisins (1 carbohydrate serving)  °3/4 ounce unsalted mini pretzels (1 carbohydrate serving)  °Evening Meal 3 ounces lean roast beef  °1/2 large baked potato (2 carbohydrate servings)  °1 tablespoon reduced-fat sour cream  °1/2 cup green beans  °1 tablespoon light salad dressing  °1 whole wheat dinner roll (1 carbohydrate serving)  °1 teaspoon margarine  °1 cup melon balls (1 carbohydrate serving)  °Evening Snack 2 tablespoons unsalted nuts  ° °Carbohydrate Counting for People with  Diabetes Vegan Sample 1-Day Menu  °Breakfast 1 cup cooked oatmeal (2 carbohydrate servings)  °½ cup blueberries (1 carbohydrate serving)  °2 tablespoons flaxseeds  °1 cup soymilk fortified with calcium and vitamin D  °1 cup coffee  °Lunch 2 slices whole wheat bread (2 carbohydrate servings)  °½ cup baked tofu  °¼ cup lettuce  °2 slices tomato  °2 slices avocado  °½ cup baby carrots  °1 orange (1 carbohydrate serving)  °1 cup soymilk fortified with calcium and vitamin D   °Evening Meal Burrito made with: 1 6-inch corn tortilla (1 carbohydrate serving)  °1 cup refried vegetarian beans (1 carbohydrate serving)  °¼ cup chopped tomatoes  °¼ cup lettuce  °¼ cup salsa  °1/3 cup brown rice (1 carbohydrate serving)  °1 tablespoon olive oil for rice  °½   cup zucchini   °Evening Snack 6 small whole grain crackers (1 carbohydrate serving)  °2 apricots (½ carbohydrate serving)  °¼ cup unsalted peanuts (½ carbohydrate serving)   ° ° °Carbohydrate Counting for People with Diabetes Vegetarian (Lacto-Ovo) Sample 1-Day Menu  °Breakfast 1 cup cooked oatmeal (2 carbohydrate servings)  °½ cup blueberries (1 carbohydrate serving)  °2 tablespoons flaxseeds  °1 egg  °1 cup 1% milk (1 carbohydrate serving)  °1 cup coffee  °Lunch 2 slices whole wheat bread (2 carbohydrate servings)  °2 ounces low-fat cheese  °¼ cup lettuce  °2 slices tomato  °2 slices avocado  °½ cup baby carrots  °1 orange (1 carbohydrate serving)  °1 cup unsweetened tea  °Evening Meal Burrito made with: 1 6-inch corn tortilla (1 carbohydrate serving)  °½ cup refried vegetarian beans (1 carbohydrate serving)  °¼ cup tomatoes  °¼ cup lettuce  °¼ cup salsa  °1/3 cup brown rice (1 carbohydrate serving)  °1 tablespoon olive oil for rice  °½ cup zucchini  °1 cup 1% milk (1 carbohydrate serving)  °Evening Snack 6 small whole grain crackers (1 carbohydrate serving)  °2 apricots (½ carbohydrate serving)  °¼ cup unsalted peanuts (½ carbohydrate serving)   ° °Copyright 2020 © Academy  of Nutrition and Dietetics. All rights reserved. ° °Using Nutrition Labels: Carbohydrate ° °• Serving Size  °• Look at the serving size. All the information on the label is based on this portion. °• Servings Per Container  °• The number of servings contained in the package. °• Guidelines for Carbohydrate  °• Look at the total grams of carbohydrate in the serving size.  °• 1 carbohydrate choice = 15 grams of carbohydrate. °Range of Carbohydrate Grams Per Choice  °Carbohydrate Grams/Choice Carbohydrate Choices  °6-10 ½  °11-20 1  °21-25 1½  °26-35 2  °36-40 2½  °41-50 3  °51-55 3½  °56-65 4  °66-70 4½  °71-80 5  ° ° °Copyright 2020 © Academy of Nutrition and Dietetics. All rights reserved. ° °

## 2019-03-12 NOTE — Progress Notes (Addendum)
Pt on insulin drip, DKA protocol. BMP available for review, NP on call notified.

## 2019-03-12 NOTE — Progress Notes (Addendum)
Inpatient Diabetes Program Recommendations  AACE/ADA: New Consensus Statement on Inpatient Glycemic Control (2015)  Target Ranges:  Prepandial:   less than 140 mg/dL      Peak postprandial:   less than 180 mg/dL (1-2 hours)      Critically ill patients:  140 - 180 mg/dL   Lab Results  Component Value Date   GLUCAP 173 (H) 03/12/2019   HGBA1C 10.5 (A) 03/11/2019    Review of Glycemic Control Results for [name], [name] (MRN [id number]) as of 03/12/2019 13:51  Ref. Range 03/11/2019 18:46 03/12/2019 00:43 03/12/2019 03:31 03/12/2019 07:33  Glucose Latest Ref Range: 70 - 99 mg/dL 588 (HH) 327 (H) 261 (H) 199 (H)   Diabetes history: DM 2- New diagnosis Outpatient Diabetes medications: None Current orders for Inpatient glycemic control:  Lantus 40 units bid Novolog moderate tid with meals Inpatient Diabetes Program Recommendations:    Spoke with pt about new diagnosis.  Discussed A1C results with her and explained what an A1C is, basic pathophysiology of DM Type 2, basic home care, importance of checking CBGs and maintaining good CBG control to prevent long-term and short-term complications.  Reviewed signs and symptoms of hyperglycemia and hypoglycemia.  RNs to provide ongoing basic DM education at bedside with this patient.  Have ordered educational booklet, insulin starter kit, and DM videos. Sent patient link regarding use of insulin pen for her to watch on her smart device.  RN to teach insulin administration and allow her to self-administer insulin as well. Will also place order for Outpatient DM Educations/per protocol-Cosign required.  Thanks,  Adah Perl, RN, BC-ADM Inpatient Diabetes Coordinator Pager [phone] (8a-5p)

## 2019-03-12 NOTE — Progress Notes (Signed)
Initial Nutrition Assessment  DOCUMENTATION CODES:   Morbid obesity  INTERVENTION:  -Education -Glucerna Shake po TID, each supplement provides 220 kcal and 10 grams of protein -MVI daily  NUTRITION DIAGNOSIS:   Food and nutrition related knowledge deficit related to limited prior education as evidenced by other (comment)(newly diagnosed DM; HgbA1C 10.5).   GOAL:   Patient will meet greater than or equal to 90% of their needs   MONITOR:   Labs, PO intake, Weight trends, Supplement acceptance  REASON FOR ASSESSMENT:   Malnutrition Screening Tool, Consult Diet education  ASSESSMENT:  38 year old female with past medical history significant for HTN, morbid obesity, GERD who was instructed from PCP to come to ED for evaluation of hyperglycemia. Patient complains of generalized weakness, polyuria, polydipsia for the last 2 to 3 weeks; reports recent upper respiratory infection treated with Z-Pak  Per chart review pt insulin requirements over previous 8 hours has been 81.8 units; consistent with significant insulin resistance.   Spoke with patient via phone this afternoon. She reports feeling very tired this afternoon. Patient endorses 2-3 weeks of decreased appetite and intake; recalls tuna with crackers, soup, lots of water, juice and tea. When feeling well, pt reports frequent use of air fryer for chicken, shrimp, salmon. She states that she "loves leafy greens" and recalls collards and spinach and stated "her downfall was rice and pasta" RD provided patient with carb counting education. Discussed sources of carbohydrates, appropriate serving amounts for meals and snacks, utilization of nutrition facts label for determining grams of carbohydrates per serving. Recognized current dietary efforts at home and encouraged continued use of air fryer and intake leafy greens; educated on the importance of balanced meals and snacks to promote stable blood glucose throughout the day.   Patient  reports feeling confident with making lifestyle changes and appreciative of conversation. RD will provide "Carb Counting" handout from Academy of Nutrition and Dietetics in discharge instructions as well as e-mail the discussed education to patient provided address.   Current wt 170.1 kg (374.2 lb) History of morbid per chart review  Noted 37.9 lb wt loss over the past 5 months (9.2%; significant for time frame)  Medications reviewed and include: SSI with meals, lantus 40 units BID, cozaar, protonix, potassium chloride 40 mEq tablet Topamax, IV Myxredlin 11.3 units/hr  Labs: CBGS 162-201 Potassium 3.1 - trending up Lab Results  Component Value Date   HGBA1C 10.5 (A) 03/11/2019   09/24/18 HgbA1C 5.5  NUTRITION - FOCUSED PHYSICAL EXAM: Unable to complete at this time   Diet Order:   Diet Order            Diet heart healthy/carb modified Room service appropriate? Yes; Fluid consistency: Thin  Diet effective now              EDUCATION NEEDS:   Education needs have been addressed     Copyright 2020  Academy of Nutrition and Dietetics. All rights reserved.   Skin:  Skin Assessment: Reviewed RN Assessment(MASD; perineum; rt; left; breast)  Last BM:  8/27  Height:   Ht Readings from Last 1 Encounters:  03/11/19 5\' 4"  (1.626 m)    Weight:   Wt Readings from Last 1 Encounters:  03/11/19 (!) 170.1 kg    Ideal Body Weight:  54.5 kg  BMI:  Body mass index is 64.37 kg/m.  Estimated Nutritional Needs:   Kcal:  [id number]  Protein:  109-119  Fluid:  >2.1L   Lajuan Lines, RD, LDN [facility] Telephone  [phone] After Hours/Weekend Pager: [phone]

## 2019-03-12 NOTE — Progress Notes (Addendum)
PROGRESS NOTE    Emily Phelps  [id number] DOB: April 07, 1981 DOA: 03/11/2019 PCP: Hoyt Koch, MD    Brief Narrative:  38 year old female who presented with elevated glucose.  She does have significant past medical history for hypertension, morbid obesity and migraines.  She reported 2 to 3-week history of generalized weakness, polyuria, and polydipsia.  She was seen at her primary care physician's office and was found severely hyperglycemic, referred to the hospital for further evaluation.  On her initial physical examination blood pressure 149/90, heart rate 119, respiratory rate 18, temperature 98.7, oxygen saturation 98%.  Her lungs are clear to auscultation bilaterally, heart S1-S2 present and rhythmic, the abdomen was soft nontender, no lower extremity edema. Sodium 132, potassium 3.5, chloride 99, bicarb 14, glucose 588, BUN less than 5, creatinine 1.1, anion gap 19, white cell count 8.8, hemoglobin 12.6, hematocrit 36.4, platelets 465.  SARS COVID-19 was negative.  Urinalysis had more than 500 glucose, 0-5 white cells, 0-5 red cells.  Specific gravity more than 1.035.  Her chest radiograph was negative for infiltrates.  EKG 117 bpm, normal axis, normal intervals, sinus rhythm, no ST segment or T wave changes, poor R wave progression.  Patient was admitted to the hospital with a working diagnosis of diabetes ketoacidosis.   Assessment & Plan:   Principal Problem:   DKA, type 2 ([place]) Active Problems:   Essential hypertension   Dyspnea   1. DKA with new onset diabetes mellitus, likely adult onset type 1. Patient this am continue on IV insulin, has no nausea or vomiting. Her glucose is 199 mg/dl and anion gap of 12. K at 3,1. Insulin requirements overt last 8 H has been 81.8 units, consistent with significant insulin resistance. Her calculated insulin requirements for 24 H will be 240 units. Her weight base dose (1mg /kg/day) 170 units. Will start with 40 units bid of long  acting insulin plus insulin sliding scale for glucose cover and monitoring.   2. Hypokalemia. Likely related to DKA, will continue K correction with Kcl, will follow on renal panel this pm at 16:00. Target K at 4,0, check Mg in am.   3. HTN. Controlled blood pressure 109/66 this am. Continue with atenolol 50 mg daily and losartan 100 mg daily.    4. Morbid obesity. Her calculated bmi si 64.3. Will need aggressive life style modifications. Will consult nutrition for education.   DVT prophylaxis: enoxaparin   Code Status: full Family Communication: no family at the bedside.  Disposition Plan/ discharge barriers: patient with very high insulin resistance and high risk for recurrent DKA and worsening hyperglycemia, will need further inpatient management for insulin dose adjustment.   Body mass index is 64.37 kg/m. Malnutrition Type:      Malnutrition Characteristics:      Nutrition Interventions:     RN Pressure Injury Documentation:     Consultants:     Procedures:     Antimicrobials:       Subjective: Patient has lower extremities cramps, moderated in intensity, bilaterally, more on the left. No improving or worsening factors and no radiation. No nausea or vomiting, no chest pain or dyspnea.   Objective: Vitals:   03/12/19 0354 03/12/19 0511 03/12/19 0537 03/12/19 0754  BP: (!) 113/57   109/66  Pulse: 95  (!) 102 92  Resp: 18  18 15   Temp:  98.2 F (36.8 C)    TempSrc:  Oral    SpO2: 98%  98% 99%  Weight:  Height:        Intake/Output Summary (Last 24 hours) at 03/12/2019 0955 Last data filed at 03/12/2019 [id number] Gross per 24 hour  Intake 3281.5 ml  Output -  Net 3281.5 ml   Filed Weights   03/11/19 1842  Weight: (!) 170.1 kg    Examination:   General: Not in pain or dyspnea, deconditioned  Neurology: Awake and alert, non focal  E ENT: mild pallor, no icterus, oral mucosa moist Cardiovascular: No JVD. S1-S2 present, rhythmic, no gallops,  rubs, or murmurs. +/++ pitting lower extremity edema. Pulmonary: positive breath sounds bilaterally, adequate air movement, no wheezing, rhonchi or rales. Gastrointestinal. Abdomen protuberant, no organomegaly, non tender, no rebound or guarding Skin. No rashes Musculoskeletal: no joint deformities     Data Reviewed: I have personally reviewed following labs and imaging studies  CBC: Recent Labs  Lab 03/11/19 1125 03/11/19 1846 03/11/19 2049 03/12/19 0043  WBC 9.5 8.8  --  8.7  NEUTROABS  --  4.7  --   --   HGB 12.9 12.6 12.6 11.8*  HCT 39.1 36.4 37.0 33.6*  MCV 83.1 80.0  --  79.1*  PLT 477.0* 465*  --  0000000   Basic Metabolic Panel: Recent Labs  Lab 03/11/19 1125 03/11/19 1846 03/11/19 2049 03/12/19 0043 03/12/19 0331 03/12/19 0733  NA 129* 132* 133* 134* 135 135  K 3.6 3.5 3.4* 3.2* 2.9* 3.1*  CL 96 99  --  100 104 103  CO2 13* 14*  --  17* 16* 20*  GLUCOSE 583* 588*  --  327* 261* 199*  BUN 6 <5*  --  <5* <5* <5*  CREATININE 1.00 1.14*  --  1.01* 0.89 0.85  CALCIUM 9.4 9.7  --  9.3 9.3 9.2   GFR: Estimated Creatinine Clearance: 147.1 mL/min (by C-G formula based on SCr of 0.85 mg/dL). Liver Function Tests: Recent Labs  Lab 03/11/19 1125 03/11/19 1846  AST 18 25  ALT 21 27  ALKPHOS 83 85  BILITOT 0.7 2.1*  PROT 7.1 6.7  ALBUMIN 3.9 3.6   No results for input(s): LIPASE, AMYLASE in the last 168 hours. No results for input(s): AMMONIA in the last 168 hours. Coagulation Profile: No results for input(s): INR, PROTIME in the last 168 hours. Cardiac Enzymes: No results for input(s): CKTOTAL, CKMB, CKMBINDEX, TROPONINI in the last 168 hours. BNP (last 3 results) Recent Labs    03/11/19 1125  PROBNP 18.0   HbA1C: Recent Labs    03/11/19 1123  HGBA1C 10.5*   CBG: Recent Labs  Lab 03/12/19 0518 03/12/19 0631 03/12/19 0737 03/12/19 0847 03/12/19 0951  GLUCAP 229* 201* 187* 183* 162*   Lipid Profile: Recent Labs    03/11/19 1125  CHOL 181   HDL 25.50*  TRIG 1086.0 Triglyceride is over 400; calculations on Lipids are invalid.*  CHOLHDL 7  LDLDIRECT 27.0   Thyroid Function Tests: Recent Labs    03/11/19 1125  TSH 5.44*   Anemia Panel: No results for input(s): VITAMINB12, FOLATE, FERRITIN, TIBC, IRON, RETICCTPCT in the last 72 hours.    Radiology Studies: I have reviewed all of the imaging during this hospital visit personally     Scheduled Meds: . atenolol  50 mg Oral Daily  . enoxaparin (LOVENOX) injection  85 mg Subcutaneous Daily  . fluticasone  2 spray Each Nare Daily  . loratadine  10 mg Oral Daily  . losartan  100 mg Oral Daily  . pantoprazole  40 mg Oral Daily  .  topiramate  50 mg Oral BID   Continuous Infusions: . sodium chloride Stopped (03/12/19 0326)  . dextrose 5 % and 0.45% NaCl 125 mL/hr at 03/12/19 0325  . insulin 10.2 Units/hr (03/12/19 0952)     LOS: 0 days        Malissa Slay Gerome Apley, MD

## 2019-03-13 DIAGNOSIS — K219 Gastro-esophageal reflux disease without esophagitis: Secondary | ICD-10-CM

## 2019-03-13 DIAGNOSIS — I10 Essential (primary) hypertension: Secondary | ICD-10-CM

## 2019-03-13 DIAGNOSIS — B379 Candidiasis, unspecified: Secondary | ICD-10-CM

## 2019-03-13 DIAGNOSIS — E1110 Type 2 diabetes mellitus with ketoacidosis without coma: Principal | ICD-10-CM

## 2019-03-13 DIAGNOSIS — E876 Hypokalemia: Secondary | ICD-10-CM

## 2019-03-13 LAB — BASIC METABOLIC PANEL
Anion gap: 13 (ref 5–15)
BUN: <5 mg/dL — ABNORMAL LOW (ref 6–20)
CO2: 18 mmol/L — ABNORMAL LOW (ref 22–32)
Calcium: 9.6 mg/dL (ref 8.9–10.3)
Chloride: 103 mmol/L (ref 98–111)
Creatinine, Ser: 0.77 mg/dL (ref 0.44–1.00)
GFR calc Af Amer: >60 mL/min (ref >60)
GFR calc non Af Amer: >60 mL/min (ref >60)
Glucose, Bld: 335 mg/dL — ABNORMAL HIGH (ref 70–99)
Potassium: 3.5 mmol/L (ref 3.5–5.1)
Sodium: 134 mmol/L — ABNORMAL LOW (ref 135–145)

## 2019-03-13 LAB — GLUCOSE, CAPILLARY
Glucose-Capillary: 327 mg/dL — ABNORMAL HIGH (ref 70–99)
Glucose-Capillary: 339 mg/dL — ABNORMAL HIGH (ref 70–99)
Glucose-Capillary: 309 mg/dL — ABNORMAL HIGH (ref 70–99)

## 2019-03-13 MED ORDER — PEN NEEDLES 31G X 8 MM MISC: 3 refills | Status: DC

## 2019-03-13 MED ORDER — FLUCONAZOLE 150 MG PO TABS: 150.0000 mg | ORAL_TABLET | Freq: Every day | ORAL | 0 refills | Status: AC

## 2019-03-13 MED ORDER — INSULIN STARTER KIT- PEN NEEDLES (ENGLISH): 1.0000 | Freq: Once | 0 refills | Status: AC

## 2019-03-13 MED ORDER — OMEPRAZOLE 20 MG PO CPDR: 20.0000 mg | DELAYED_RELEASE_CAPSULE | Freq: Two times a day (BID) | ORAL | 1 refills | Status: DC

## 2019-03-13 MED ORDER — INSULIN GLARGINE 100 UNIT/ML ~~LOC~~ SOLN
35.0000 [IU] | Freq: Two times a day (BID) | SUBCUTANEOUS | Status: DC
  Administered 2019-03-13: 35 [IU] via SUBCUTANEOUS
  Filled 2019-03-13 (×2): qty 0.35

## 2019-03-13 MED ORDER — BLOOD GLUCOSE MONITOR KIT: PACK | 0 refills | Status: DC

## 2019-03-13 MED ORDER — INSULIN GLARGINE 100 UNIT/ML SOLOSTAR PEN: 35.0000 [IU] | PEN_INJECTOR | Freq: Two times a day (BID) | SUBCUTANEOUS | 4 refills | Status: DC

## 2019-03-13 NOTE — Discharge Summary (Signed)
Physician Discharge Summary  Emily Phelps IFO:[id number] DOB: 1980-10-18 DOA: 03/11/2019  PCP: Emily Koch, MD  Admit date: 03/11/2019 Discharge date: 03/13/2019  Time spent: 35 minutes  Recommendations for Outpatient Follow-up:  1. Repeat basic metabolic panel to follow electrolytes and renal function 2. Close follow-up with patient CBGs reported adjustment of hypoglycemic regimen as needed.   Discharge Diagnoses:  DKA, type 2 ([place]) Essential hypertension Obesity, Class III, BMI 40-49.9 (morbid obesity) ([place]) GERD Hypokalemia Yeast infection   Discharge Condition: Stable and improved.  Patient discharged home with instructions to follow-up with PCP in 10 days.  Diet recommendation: Heart healthy, modified carbohydrate and low calorie diet.  Filed Weights   03/11/19 1842 03/13/19 0500  Weight: (!) 170.1 kg (!) 174.9 kg    History of present illness:  As per H&P written by Dr. Hal Hope on 03/11/19 38 y.o. female with history of hypertension morbid obesity migraine has been experiencing generalized weakness polyuria polydipsia for last 2 to 3 weeks.  Last week patient was treated for upper respiratory tract infection with Z-Pak.  Due to persistent symptoms patient had gone to her primary care physician who had done blood work showed hyperglycemia and was instructed to come to the ER.  Denies any chest pain though patient has been noticing increasing exertional dyspnea over the last few days.  Has not had any fever chills productive cough.  ED Course: In the ER patient is afebrile.  EKG shows sinus tachycardia.  Lab work show blood glucose of 588 bicarb of 14 anion gap of 19.  Patient was started on IV fluids and IV insulin infusion for DKA.  Patient also had exertional dyspnea for which chest x-ray was done which was negative.  BNP D-dimers were negative.  Troponin unremarkable.  Hospital Course:  1-DKA: Type II, without coma in a patient with underlying morbid  obesity. -Acute close resolve after the use of IV fluids. -Patient will be discharged on metformin twice a day along with the use Lantus 35 units twice a day -A1c 10.5 -Patient advised to follow modified carbohydrates diet -further adjustment to hypoglycemic regimen by PCP base on CBG's fluctuation.  2-hypokalemia -Repleted and within normal limits at discharge -Repeat basic metabolic panel follow-up visit to reassess electrolytes trend.  3-essential hypertension -Stable and well-controlled -Continue home antihypertensive regimen -Patient advised to follow heart healthy diet.  4-gastroesophageal reflux disease -Continue PPI  5-Yeast infection -Complete therapy with the use of Diflucan as instructed.  6-morbid obesity -Body mass index is 66.19 kg/m. -Low calorie diet, portion control and increase physical activity has been discussed with patient -She will benefit of outpatient referral to bariatric clinic.  Procedures:  See below for x-ray reports.  Consultations:  Diabetes coordinator  Discharge Exam: Vitals:   03/12/19 2227 03/13/19 0552  BP: 98/67 95/64  Pulse: 79 81  Resp: 19 20  Temp: 97.7 F (36.5 C) 97.9 F (36.6 C)  SpO2: 100% 100%    General: Morbidly obese, no chest pain, no shortness of breath, no nausea, no vomiting.  Patient is afebrile and ready for discharge. Cardiovascular: S1 and S2, no rubs, no gallops, no murmurs. Respiratory: Predominantly bilaterally, no wheezing, no crackles. Abdomen: Obese, soft, nontender, nondistended, positive bowel sounds Extremities: No cyanosis or clubbing.  Discharge Instructions   Discharge Instructions    Ambulatory referral to Nutrition and Diabetic Education   Complete by: As directed    New diagnosis of DM- A1C=10.5%- New to insulin   Diet - low sodium heart healthy  Complete by: As directed    Discharge instructions   Complete by: As directed    Follow heart healthy, low calorie and notify  carbohydrate diet (less than 60 g of carbohydrates on daily basis). Maintain adequate hydration Take medications as prescribed Arrange follow-up with PCP in 10 days Check blood sugar levels 3 times a day (fasting in the morning, around lunchtime and before bed).     Allergies as of 03/13/2019   No Known Allergies     Medication List    STOP taking these medications   azithromycin 250 MG tablet Commonly known as: Zithromax Z-Pak     TAKE these medications   atenolol 50 MG tablet Commonly known as: TENORMIN TAKE 1 TABLET BY MOUTH EVERY DAY   benzonatate 200 MG capsule Commonly known as: TESSALON Take 1 capsule (200 mg total) by mouth 3 (three) times daily as needed.   blood glucose meter kit and supplies Kit Dispense based on patient and insurance preference. Use to check blood sugar level three times daily as directed. (FOR ICD-9 250.00, 250.01).   fexofenadine 180 MG tablet Commonly known as: ALLEGRA TAKE 1 TABLET BY MOUTH EVERY DAY   fluconazole 150 MG tablet Commonly known as: DIFLUCAN Take 1 tablet (150 mg total) by mouth daily for 2 doses. Start taking on: March 14, 2019   fluticasone 50 MCG/ACT nasal spray Commonly known as: FLONASE Place 2 sprays into both nostrils daily.   hydrochlorothiazide 25 MG tablet Commonly known as: HYDRODIURIL Take 1 tablet (25 mg total) by mouth daily.   Insulin Glargine 100 UNIT/ML Solostar Pen Commonly known as: LANTUS Inject 35 Units into the skin 2 (two) times daily.   insulin starter kit- pen needles Misc 1 kit by Other route once for 1 dose.   losartan 100 MG tablet Commonly known as: COZAAR Take 1 tablet (100 mg total) by mouth daily.   multivitamin with minerals Tabs tablet Take 1 tablet by mouth daily.   omeprazole 20 MG capsule Commonly known as: PRILOSEC Take 1 capsule (20 mg total) by mouth 2 (two) times daily before a meal. What changed:   how much to take  when to take this   Pen Needles 31G X 8 MM  Misc Use twice a day to inject insulin as instructed.   Previfem 0.25-35 MG-MCG tablet Generic drug: norgestimate-ethinyl estradiol TAKE 1 TABLET BY MOUTH EVERY DAY   topiramate 50 MG tablet Commonly known as: TOPAMAX TAKE 1 TABLET BY MOUTH 2 TIMES DAILY      No Known Allergies Follow-up Information    Emily Koch, MD. Schedule an appointment as soon as possible for a visit in 10 day(s).   Specialty: Internal Medicine Contact information: [place] [phone]           The results of significant diagnostics from this hospitalization (including imaging, microbiology, ancillary and laboratory) are listed below for reference.    Significant Diagnostic Studies: Dg Chest Portable 1 View  Result Date: 03/11/2019 CLINICAL DATA:  Dyspnea on exertion EXAM: PORTABLE CHEST 1 VIEW COMPARISON:  01/09/2017 FINDINGS: The heart size and mediastinal contours are within normal limits. Both lungs are clear. The visualized skeletal structures are unremarkable. IMPRESSION: No active disease. Electronically Signed   By: Donavan Foil M.D.   On: 03/11/2019 20:57    Microbiology: Recent Results (from the past 240 hour(s))  SARS CORONAVIRUS 2 (TAT 6-12 HRS) Nasal Swab Aptima Multi Swab     Status: None  Collection Time: 03/11/19  8:13 PM   Specimen: Aptima Multi Swab; Nasal Swab  Result Value Ref Range Status   SARS Coronavirus 2 NEGATIVE NEGATIVE Final    Comment: (NOTE) SARS-CoV-2 target nucleic acids are NOT DETECTED. The SARS-CoV-2 RNA is generally detectable in upper and lower respiratory specimens during the acute phase of infection. Negative results do not preclude SARS-CoV-2 infection, do not rule out co-infections with other pathogens, and should not be used as the sole basis for treatment or other patient management decisions. Negative results must be combined with clinical observations, patient history, and epidemiological information.  The expected result is Negative. Fact Sheet for Patients: SugarRoll.be Fact Sheet for Healthcare Providers: https://www.woods-mathews.com/ This test is not yet approved or cleared by the [place] FDA and  has been authorized for detection and/or diagnosis of SARS-CoV-2 by FDA under an Emergency Use Authorization (EUA). This EUA will remain  in effect (meaning this test can be used) for the duration of the COVID-19 declaration under Section 56 4(b)(1) of the Act, 21 U.S.C. section 360bbb-3(b)(1), unless the authorization is terminated or revoked sooner. Performed at [facility], [place] [address] [place], [place] [address]   MRSA PCR Screening     Status: None   Collection Time: 03/12/19  7:53 AM   Specimen: Nasal Mucosa; Nasopharyngeal  Result Value Ref Range Status   MRSA by PCR NEGATIVE NEGATIVE Final    Comment:        The GeneXpert MRSA Assay (FDA approved for NASAL specimens only), is one component of a comprehensive MRSA colonization surveillance program. It is not intended to diagnose MRSA infection nor to guide or monitor treatment for MRSA infections. Performed at [facility], [place] [address] [place],  [address]      Labs: Basic Metabolic Panel: Recent Labs  Lab 03/12/19 0043 03/12/19 0331 03/12/19 0733 03/12/19 1429 03/13/19 0417  NA 134* 135 135 133* 134*  K 3.2* 2.9* 3.1* 3.1* 3.5  CL 100 104 103 102 103  CO2 17* 16* 20* 19* 18*  GLUCOSE 327* 261* 199* 210* 335*  BUN <5* <5* <5* <5* <5*  CREATININE 1.01* 0.89 0.85 0.71 0.77  CALCIUM 9.3 9.3 9.2 9.7 9.6   Liver Function Tests: Recent Labs  Lab 03/11/19 1125 03/11/19 1846  AST 18 25  ALT 21 27  ALKPHOS 83 85  BILITOT 0.7 2.1*  PROT 7.1 6.7  ALBUMIN 3.9 3.6   CBC: Recent Labs  Lab 03/11/19 1125 03/11/19 1846 03/11/19 2049 03/12/19 0043  WBC 9.5 8.8  --  8.7  NEUTROABS  --  4.7  --   --   HGB 12.9 12.6 12.6 11.8*  HCT  39.1 36.4 37.0 33.6*  MCV 83.1 80.0  --  79.1*  PLT 477.0* 465*  --  385    BNP (last 3 results) Recent Labs    03/12/19 0043  BNP 68.3    ProBNP (last 3 results) Recent Labs    03/11/19 1125  PROBNP 18.0    CBG: Recent Labs  Lab 03/12/19 1130 03/12/19 1646 03/12/19 2224 03/13/19 0234 03/13/19 0833  GLUCAP 173* 298* 332* 327* 339*    Signed:  Barton Dubois MD.  [facility] 03/13/2019, 11:55 AM

## 2019-03-13 NOTE — Plan of Care (Signed)
  Problem: Education: Goal: Knowledge of General Education information will improve Description Including pain rating scale, medication(s)/side effects and non-pharmacologic comfort measures Outcome: Progressing   

## 2019-03-13 NOTE — Progress Notes (Signed)
Pt alert and oriented x4, no complaints of pain or discomfort.  Bed in low position, call bell within reach.  Bed alarms on and functioning.  Assessment done and charted.  Will continue to monitor and do hourly rounding throughout the shift 

## 2019-03-13 NOTE — Progress Notes (Signed)
Discharge:  Pt d/c from room via wheelchair, Family member with the pt.  Discharge instructions given to the patient and family members.  No questions from pt,   Pt dressed in street clothes and left with discharge papers and prescriptions  in hand.  IV d/ced, tele removed and no complaints of pain or discomfort. 

## 2019-03-14 ENCOUNTER — Other Ambulatory Visit: Payer: Self-pay | Admitting: Internal Medicine

## 2019-03-16 ENCOUNTER — Other Ambulatory Visit (INDEPENDENT_AMBULATORY_CARE_PROVIDER_SITE_OTHER): Payer: BC Managed Care – PPO

## 2019-03-16 ENCOUNTER — Ambulatory Visit (INDEPENDENT_AMBULATORY_CARE_PROVIDER_SITE_OTHER): Payer: BC Managed Care – PPO | Admitting: Internal Medicine

## 2019-03-16 ENCOUNTER — Other Ambulatory Visit: Payer: Self-pay

## 2019-03-16 ENCOUNTER — Encounter: Payer: Self-pay | Admitting: Internal Medicine

## 2019-03-16 VITALS — BP 120/82 | HR 94 | Temp 99.1°F | Ht 64.0 in | Wt 379.0 lb

## 2019-03-16 DIAGNOSIS — E1110 Type 2 diabetes mellitus with ketoacidosis without coma: Secondary | ICD-10-CM

## 2019-03-16 DIAGNOSIS — E119 Type 2 diabetes mellitus without complications: Secondary | ICD-10-CM | POA: Diagnosis not present

## 2019-03-16 DIAGNOSIS — B379 Candidiasis, unspecified: Secondary | ICD-10-CM | POA: Diagnosis not present

## 2019-03-16 LAB — BASIC METABOLIC PANEL
BUN: 6 mg/dL (ref 6–23)
CO2: 27 mEq/L (ref 19–32)
Calcium: 11 mg/dL — ABNORMAL HIGH (ref 8.4–10.5)
Chloride: 94 mEq/L — ABNORMAL LOW (ref 96–112)
Creatinine, Ser: 0.9 mg/dL (ref 0.40–1.20)
GFR: 85.86 mL/min (ref >60.00)
Glucose, Bld: 237 mg/dL — ABNORMAL HIGH (ref 70–99)
Potassium: 3.2 mEq/L — ABNORMAL LOW (ref 3.5–5.1)
Sodium: 137 mEq/L (ref 135–145)

## 2019-03-16 MED ORDER — HYDROCORTISONE (PERIANAL) 2.5 % EX CREA: 1.0000 "application " | TOPICAL_CREAM | Freq: Two times a day (BID) | CUTANEOUS | 0 refills | Status: DC

## 2019-03-16 NOTE — Progress Notes (Signed)
   Subjective:   Patient ID: Emily Phelps, female    DOB: [birth year], 38 y.o.   MRN: [id number]  HPI The patient is a 38 YO female coming in for follow up hospital (in for new diabetes with AG and we sent to hospital, given IV fluids and started on insulin, discharged with lantus 35 units BID and metformin however metformin not on her current med list). She is feeling slightly better but still not well. She is having severe diarrhea from metformin. Taking 1000 mg BID currently since leaving hospital. Still having some lack of appetite. She denies SOB or chest tightness which she was having before. She is taking the insulin 35 units twice a day. She is having some stomach discomfort. Her anus is irritated due to such diarrhea. She is eating and her brother is helping to care from her. She denies being able to leave the house much due to diarrhea. Weight is going down some since leaving hospital. She is trying to drink fluids some.   PMH, [name], social history reviewed and updated  Review of Systems  Constitutional: Positive for activity change, appetite change and unexpected weight change.  HENT: Negative.   Eyes: Negative.   Respiratory: Negative for cough, chest tightness and shortness of breath.   Cardiovascular: Negative for chest pain, palpitations and leg swelling.  Gastrointestinal: Positive for nausea and rectal pain. Negative for abdominal distention, abdominal pain, anal bleeding, blood in stool, constipation, diarrhea and vomiting.  Musculoskeletal: Negative.   Skin: Negative.   Neurological: Negative.   Psychiatric/Behavioral: Positive for decreased concentration and dysphoric mood.    Objective:  Physical Exam Constitutional:      Appearance: She is well-developed. She is obese.  HENT:     Head: Normocephalic and atraumatic.  Neck:     Musculoskeletal: Normal range of motion.  Cardiovascular:     Rate and Rhythm: Normal rate and regular rhythm.  Pulmonary:     Effort:  Pulmonary effort is normal. No respiratory distress.     Breath sounds: Normal breath sounds. No wheezing or rales.  Abdominal:     General: Bowel sounds are normal. There is no distension.     Palpations: Abdomen is soft.     Tenderness: There is no abdominal tenderness. There is no rebound.  Musculoskeletal:        General: No tenderness.  Skin:    General: Skin is warm and dry.  Neurological:     Mental Status: She is alert and oriented to person, place, and time.     Coordination: Coordination normal.    Vitals:   03/16/19 1128  BP: 120/82  Pulse: 94  Temp: 99.1 F (37.3 C)  TempSrc: Oral  SpO2: 98%  Weight: (!) 379 lb (171.9 kg)  Height: 5\' 4"  (1.626 m)    Assessment & Plan:

## 2019-03-16 NOTE — Patient Instructions (Addendum)
We have sent in the anusol to use twice a day on the butt to help this heal.   We will have you drop down the metformin to 1/2 pill daily for 1 week.   Then increase to 1/2 pill twice a day for 1 week.  Then increase to 1 pill in the morning and 1/2 pill in the evening.  Then increase to 1 pill in the morning and 1 pill in the evening.   If at any time during this process you are still having a lot of diarrhea go back down to the lower dose which you were okay with. Most people the diarrhea fades or decreases within 2 weeks.   If you are still having problems let us know.    Diabetes Mellitus and Standards of Medical Care Managing diabetes (diabetes mellitus) can be complicated. Your diabetes treatment may be managed by a team of health care providers, including:  A physician who specializes in diabetes (endocrinologist).  A nurse practitioner or physician assistant.  Nurses.  A diet and nutrition specialist (registered dietitian).  A certified diabetes educator (CDE).  An exercise specialist.  A pharmacist.  An eye doctor.  A foot specialist (podiatrist).  A dentist.  A primary care provider.  A mental health provider. Your health care providers follow guidelines to help you get the best quality of care. The following schedule is a general guideline for your diabetes management plan. Your health care providers may give you more specific instructions. Physical exams Upon being diagnosed with diabetes mellitus, and each year after that, your health care provider will ask about your medical and family history. He or she will also do a physical exam. Your exam may include:  Measuring your height, weight, and body mass index (BMI).  Checking your blood pressure. This will be done at every routine medical visit. Your target blood pressure may vary depending on your medical conditions, your age, and other factors.  Thyroid gland exam.  Skin exam.  Screening for damage  to your nerves (peripheral neuropathy). This may include checking the pulse in your legs and feet and checking the level of sensation in your hands and feet.  A complete foot exam to inspect the structure and skin of your feet, including checking for cuts, bruises, redness, blisters, sores, or other problems.  Screening for blood vessel (vascular) problems, which may include checking the pulse in your legs and feet and checking your temperature. Blood tests Depending on your treatment plan and your personal needs, you may have the following tests done:  HbA1c (hemoglobin A1c). This test provides information about blood sugar (glucose) control over the previous 2-3 months. It is used to adjust your treatment plan, if needed. This test will be done: ? At least 2 times a year, if you are meeting your treatment goals. ? 4 times a year, if you are not meeting your treatment goals or if treatment goals have changed.  Lipid testing, including total, LDL, and HDL cholesterol and triglyceride levels. ? The goal for LDL is less than 100 mg/dL (5.5 mmol/L). If you are at high risk for complications, the goal is less than 70 mg/dL (3.9 mmol/L). ? The goal for HDL is 40 mg/dL (2.2 mmol/L) or higher for men and 50 mg/dL (2.8 mmol/L) or higher for women. An HDL cholesterol of 60 mg/dL (3.3 mmol/L) or higher gives some protection against heart disease. ? The goal for triglycerides is less than 150 mg/dL (8.3 mmol/L).  Liver function tests.  Kidney function tests.  Thyroid function tests. Dental and eye exams  Visit your dentist two times a year.  If you have type 1 diabetes, your health care provider may recommend an eye exam 3-5 years after you are diagnosed, and then once a year after your first exam. ? For children with type 1 diabetes, a health care provider may recommend an eye exam when your child is age 36 or older and has had diabetes for 3-5 years. After the first exam, your child should get an  eye exam once a year.  If you have type 2 diabetes, your health care provider may recommend an eye exam as soon as you are diagnosed, and then once a year after your first exam. Immunizations   The yearly flu (influenza) vaccine is recommended for everyone 6 months or older who has diabetes.  The pneumonia (pneumococcal) vaccine is recommended for everyone 2 years or older who has diabetes. If you are 40 or older, you may get the pneumonia vaccine as a series of two separate shots.  The hepatitis B vaccine is recommended for adults shortly after being diagnosed with diabetes.  Adults and children with diabetes should receive all other vaccines according to age-specific recommendations from the Centers for Disease Control and Prevention (CDC). Mental and emotional health Screening for symptoms of eating disorders, anxiety, and depression is recommended at the time of diagnosis and afterward as needed. If your screening shows that you have symptoms (positive screening result), you may need more evaluation and you may work with a mental health care provider. Treatment plan Your treatment plan will be reviewed at every medical visit. You and your health care provider will discuss:  How you are taking your medicines, including insulin.  Any side effects you are experiencing.  Your blood glucose target goals.  The frequency of your blood glucose monitoring.  Lifestyle habits, such as activity level as well as tobacco, alcohol, and substance use. Diabetes self-management education Your health care provider will assess how well you are monitoring your blood glucose levels and whether you are taking your insulin correctly. He or she may refer you to:  A certified diabetes educator to manage your diabetes throughout your life, starting at diagnosis.  A registered dietitian who can create or review your personal nutrition plan.  An exercise specialist who can discuss your activity level and  exercise plan. Summary  Managing diabetes (diabetes mellitus) can be complicated. Your diabetes treatment may be managed by a team of health care providers.  Your health care providers follow guidelines in order to help you get the best quality of care.  Standards of care including having regular physical exams, blood tests, blood pressure monitoring, immunizations, screening tests, and education about how to manage your diabetes.  Your health care providers may also give you more specific instructions based on your individual health. This information is not intended to replace advice given to you by your health care provider. Make sure you discuss any questions you have with your health care provider. Document Released: 04/27/2009 Document Revised: 03/19/2018 Document Reviewed: 03/28/2016 Elsevier Patient Education  2020 [employer].

## 2019-03-17 DIAGNOSIS — E119 Type 2 diabetes mellitus without complications: Secondary | ICD-10-CM | POA: Insufficient documentation

## 2019-03-17 NOTE — Assessment & Plan Note (Signed)
Taking lantus 35 units BID and metformin. Will cut back metformin due to GI intolerance. Slow titration of metformin and hopefully this will improve tolerance. Rx anusol for anal irritation.

## 2019-03-17 NOTE — Assessment & Plan Note (Signed)
Still taking diflucan and her high sugars are likely causing this to be persistent.

## 2019-03-17 NOTE — Assessment & Plan Note (Signed)
Checking BMP today for AG. Her sugars are coming down. We need to back off on metformin due to severe diarrhea which is likely causing some dehydration and anal irritation. 500 mg daily for 1 week, then 500 mg BID for 1 week, then 1000 mg and 500 mg for 1 week, then 1000 mg bid. If GI intolerance she will let us know.

## 2019-03-18 ENCOUNTER — Encounter: Payer: Self-pay | Admitting: Internal Medicine

## 2019-03-22 ENCOUNTER — Telehealth: Payer: Self-pay | Admitting: Internal Medicine

## 2019-03-22 NOTE — Telephone Encounter (Signed)
Patient dropped off FMLA to be completed. She states she was given a work note but her job is requiring FMLA to be filled out. Placed in Emily Phelps's box for completion. Please call patient when ready to pick up.

## 2019-03-29 NOTE — Telephone Encounter (Signed)
Forms have been completed & Placed in providers box to review and sign.  

## 2019-03-30 DIAGNOSIS — Z0279 Encounter for issue of other medical certificate: Secondary | ICD-10-CM

## 2019-03-30 NOTE — Telephone Encounter (Signed)
Forms have been signed, Faxed to [employer] @ [phone], Copy sent to scan &Charged for.   LVM to inform patient. Original has been mailed to patient for her records.

## 2019-03-31 ENCOUNTER — Other Ambulatory Visit: Payer: Self-pay

## 2019-03-31 ENCOUNTER — Ambulatory Visit (INDEPENDENT_AMBULATORY_CARE_PROVIDER_SITE_OTHER): Payer: BC Managed Care – PPO | Admitting: Internal Medicine

## 2019-03-31 ENCOUNTER — Encounter: Payer: Self-pay | Admitting: Internal Medicine

## 2019-03-31 ENCOUNTER — Other Ambulatory Visit: Payer: Self-pay | Admitting: Internal Medicine

## 2019-03-31 ENCOUNTER — Other Ambulatory Visit (INDEPENDENT_AMBULATORY_CARE_PROVIDER_SITE_OTHER): Payer: BC Managed Care – PPO

## 2019-03-31 VITALS — BP 130/82 | HR 88 | Temp 98.7°F | Ht 64.0 in | Wt 385.0 lb

## 2019-03-31 DIAGNOSIS — E119 Type 2 diabetes mellitus without complications: Secondary | ICD-10-CM

## 2019-03-31 LAB — COMPREHENSIVE METABOLIC PANEL
ALT: 27 U/L (ref 0–35)
AST: 21 U/L (ref 0–37)
Albumin: 4 g/dL (ref 3.5–5.2)
Alkaline Phosphatase: 74 U/L (ref 39–117)
BUN: 10 mg/dL (ref 6–23)
CO2: 24 mEq/L (ref 19–32)
Calcium: 11.1 mg/dL — ABNORMAL HIGH (ref 8.4–10.5)
Chloride: 106 mEq/L (ref 96–112)
Creatinine, Ser: 0.93 mg/dL (ref 0.40–1.20)
GFR: 82.65 mL/min (ref >60.00)
Glucose, Bld: 150 mg/dL — ABNORMAL HIGH (ref 70–99)
Potassium: 4.2 mEq/L (ref 3.5–5.1)
Sodium: 140 mEq/L (ref 135–145)
Total Bilirubin: 0.8 mg/dL (ref 0.2–1.2)
Total Protein: 7.2 g/dL (ref 6.0–8.3)

## 2019-03-31 LAB — CBC
HCT: 37.2 % (ref 36.0–46.0)
Hemoglobin: 12.5 g/dL (ref 12.0–15.0)
MCHC: 33.6 g/dL (ref 30.0–36.0)
MCV: 83.9 fl (ref 78.0–100.0)
Platelets: 435 10*3/uL — ABNORMAL HIGH (ref 150.0–400.0)
RBC: 4.44 Mil/uL (ref 3.87–5.11)
RDW: 18.6 % — ABNORMAL HIGH (ref 11.5–15.5)
WBC: 11.4 10*3/uL — ABNORMAL HIGH (ref 4.0–10.5)

## 2019-03-31 MED ORDER — OMEPRAZOLE 20 MG PO CPDR: 20.0000 mg | DELAYED_RELEASE_CAPSULE | Freq: Every day | ORAL | 3 refills | Status: DC

## 2019-03-31 NOTE — Assessment & Plan Note (Signed)
Taking lantus 35 units BID for several weeks now. Checking fructosamine to see if this is helping. Will add glimepiride if not improving well. On ARB and goal is to improve HgA1c. Will see back in November.

## 2019-03-31 NOTE — Patient Instructions (Addendum)
We will check the blood work today and then decide if we need to add a medicine to replace metformin.   The omeprazole is twice a day just for a month or so.

## 2019-03-31 NOTE — Progress Notes (Signed)
   Subjective:   Patient ID: Emily Phelps, female    DOB: 1980/09/02, 38 y.o.   MRN: [id number]  HPI The patient is a 38 YO female coming in for follow up of her new diabetes. Started metformin about 2-3 weeks ago. She was having GI intolerance last visit severe so we dropped back to a slow taper up. She is currently taking no metformin due to finally feeling better and she was scared to take it again. She denies peeing often or extreme thirst. She denies stomach pain. Rare nausea after eating. She is checking sugars and around 130 in the morning. She is taking lantus 35 units BID without problems. Some sugars after meals 200 or more. Denies headaches or fevers or chills. Denies diarrhea or constipation.   Review of Systems  Constitutional: Negative.   HENT: Negative.   Eyes: Negative.   Respiratory: Negative for cough, chest tightness and shortness of breath.   Cardiovascular: Negative for chest pain, palpitations and leg swelling.  Gastrointestinal: Positive for nausea. Negative for abdominal distention, abdominal pain, constipation, diarrhea and vomiting.  Musculoskeletal: Negative.   Skin: Negative.   Neurological: Negative.   Psychiatric/Behavioral: Negative.     Objective:  Physical Exam Constitutional:      Appearance: She is well-developed. She is obese.  HENT:     Head: Normocephalic and atraumatic.  Neck:     Musculoskeletal: Normal range of motion.  Cardiovascular:     Rate and Rhythm: Normal rate and regular rhythm.  Pulmonary:     Effort: Pulmonary effort is normal. No respiratory distress.     Breath sounds: Normal breath sounds. No wheezing or rales.  Abdominal:     General: Bowel sounds are normal. There is no distension.     Palpations: Abdomen is soft.     Tenderness: There is no abdominal tenderness. There is no rebound.  Skin:    General: Skin is warm and dry.  Neurological:     Mental Status: She is alert and oriented to person, place, and time.   Coordination: Coordination normal.     Vitals:   03/31/19 1041  BP: 130/82  Pulse: 88  Temp: 98.7 F (37.1 C)  TempSrc: Oral  SpO2: 98%  Weight: (!) 385 lb (174.6 kg)  Height: 5\' 4"  (1.626 m)    Assessment & Plan:

## 2019-04-01 ENCOUNTER — Encounter: Payer: Self-pay | Admitting: *Deleted

## 2019-04-01 ENCOUNTER — Encounter: Payer: BC Managed Care – PPO | Attending: Internal Medicine | Admitting: *Deleted

## 2019-04-01 DIAGNOSIS — E119 Type 2 diabetes mellitus without complications: Secondary | ICD-10-CM | POA: Insufficient documentation

## 2019-04-01 NOTE — Progress Notes (Signed)
Patient was seen on 04/01/2019 for the first of a series of three diabetes self-management courses at the [facility].  Patient Education Plan per assessed needs and concerns is to attend three course education program for Diabetes Self Management Education.  The following learning objectives were met by the patient during this class:  Describe diabetes, types of diabetes and pathophysiology  State some common risk factors for diabetes  Defines the role of glucose and insulin  Describe the relationship between diabetes and cardiovascular and other risks  State the members of the Healthcare Team  States the rationale for glucose monitoring and when to test  State their individual Target Range  State the importance of logging glucose readings and how to interpret the readings  Identifies A1C target  Explain the correlation between A1c and eAG values  State symptoms and treatment of high blood glucose and low blood glucose  Explain proper technique for glucose testing and identify proper sharps disposal  Handouts given during class include:  How to Thrive:  A Guide for Your Journey with Diabetes by the ADA  Meal Plan Card and carbohydrate content list  Dietary intake form  Low Sodium Flavoring Tips  Types of Fats  Dining Out  Label reading  Snack list  Planning a balanced meal  The diabetes portion plate  Diabetes Resources  A1c to eAG Conversion Chart  Blood Glucose Log  Diabetes Recommended Care Schedule  Support Group  Diabetes Success Plan  Core Class Satisfaction Survey   Follow-Up Plan:  Attend core 2   

## 2019-04-05 ENCOUNTER — Encounter: Payer: Self-pay | Admitting: Dietician

## 2019-04-05 ENCOUNTER — Other Ambulatory Visit: Payer: Self-pay

## 2019-04-05 ENCOUNTER — Encounter: Payer: BC Managed Care – PPO | Admitting: Dietician

## 2019-04-05 DIAGNOSIS — E119 Type 2 diabetes mellitus without complications: Secondary | ICD-10-CM

## 2019-04-05 NOTE — Progress Notes (Signed)
Patient was seen on 04/05/2019 for the second of a series of three diabetes self-management courses at the [facility]. The following learning objectives were met by the patient during this class:   Describe the role of different macronutrients on glucose  Explain how carbohydrates affect blood glucose  State what foods contain the most carbohydrates  Demonstrate carbohydrate counting  Demonstrate how to read Nutrition Facts food label  Describe effects of various fats on heart health  Describe the importance of good nutrition for health and healthy eating strategies  Describe techniques for managing your shopping, cooking and meal planning  List strategies to follow meal plan when dining out  Describe the effects of alcohol on glucose and how to use it safely  Goals:  Follow Diabetes Meal Plan as instructed  Aim to spread carbs evenly throughout the day  Aim for 3 meals per day and snacks as needed Include lean protein foods to meals/snacks  Monitor glucose levels as instructed by your doctor   Follow-Up Plan:  Attend Core 3  Work towards following your personal food plan.

## 2019-04-06 LAB — FRUCTOSAMINE: Fructosamine: 311 umol/L — ABNORMAL HIGH (ref 205–285)

## 2019-04-12 ENCOUNTER — Other Ambulatory Visit: Payer: Self-pay

## 2019-04-12 ENCOUNTER — Telehealth: Payer: Self-pay

## 2019-04-12 ENCOUNTER — Encounter: Payer: Self-pay | Admitting: Dietician

## 2019-04-12 ENCOUNTER — Encounter: Payer: BC Managed Care – PPO | Admitting: Dietician

## 2019-04-12 DIAGNOSIS — E119 Type 2 diabetes mellitus without complications: Secondary | ICD-10-CM

## 2019-04-12 MED ORDER — CONTOUR NEXT TEST VI STRP: ORAL_STRIP | 1 refills | Status: DC

## 2019-04-12 MED ORDER — LANCETS MISC: 1 refills | Status: DC

## 2019-04-12 NOTE — Telephone Encounter (Signed)
Copied from [place] [id number]. Topic: General - Inquiry >> Apr 12, 2019 12:15 PM Reyne Dumas L wrote: Reason for CRM:  Pt states she was told by [employer] that her FMLA paperwork would be mailed to her.  Pt states that it has been two weeks and she still hasn't received them.  Pt wants to make sure that they were in fact put in the mail and what she needs to do since it's been so long and she hasn't received them. Pt can be reached at [phone]

## 2019-04-12 NOTE — Telephone Encounter (Signed)
Called pharmacy and informed patient I am not sure where the FMLA went that I mailed but we had a copy so she could come pick up a copy. I informed her that brittany had faxed it to wherever it needed to be faxed. Patient stated understanding and picked up a copy. Patient was also asking if she could get her test strips and lancets sent in to her pharmacy those have been sent in.

## 2019-04-12 NOTE — Progress Notes (Signed)
Patient was seen on 04/12/2019 for the third of a series of three diabetes self-management courses at the [facility].   Emily Phelps the amount of activity recommended for healthy living . Describe activities suitable for individual needs . Identify ways to regularly incorporate activity into daily life . Identify barriers to activity and ways to over come these barriers  Identify diabetes medications being personally used and their primary action for lowering glucose and possible side effects . Describe role of stress on blood glucose and develop strategies to address psychosocial issues . Identify diabetes complications and ways to prevent them  Explain how to manage diabetes during illness . Evaluate success in meeting personal goal . Establish 2-3 goals that they will plan to diligently work on  Goals:   I will count my carb choices at most meals and snacks  I will be more active  Your patient has identified these potential barriers to change:  None  Your patient has identified their diabetes self-care support plan as  Family Support On-line Resources   Plan:  Attend Support Group as desired

## 2019-04-13 MED ORDER — GLIMEPIRIDE 2 MG PO TABS: 2.0000 mg | ORAL_TABLET | Freq: Every day | ORAL | 6 refills | Status: DC

## 2019-04-15 ENCOUNTER — Encounter: Payer: Self-pay | Admitting: Gastroenterology

## 2019-04-15 ENCOUNTER — Ambulatory Visit: Payer: BC Managed Care – PPO | Admitting: Gastroenterology

## 2019-04-15 VITALS — BP 132/74 | HR 71 | Temp 97.4°F | Ht 64.0 in | Wt 385.0 lb

## 2019-04-15 DIAGNOSIS — K219 Gastro-esophageal reflux disease without esophagitis: Secondary | ICD-10-CM

## 2019-04-15 DIAGNOSIS — R197 Diarrhea, unspecified: Secondary | ICD-10-CM

## 2019-04-15 DIAGNOSIS — K625 Hemorrhage of anus and rectum: Secondary | ICD-10-CM

## 2019-04-15 DIAGNOSIS — R6881 Early satiety: Secondary | ICD-10-CM

## 2019-04-15 DIAGNOSIS — Z86010 Personal history of colonic polyps: Secondary | ICD-10-CM

## 2019-04-15 MED ORDER — OMEPRAZOLE 40 MG PO CPDR: 40.0000 mg | DELAYED_RELEASE_CAPSULE | Freq: Two times a day (BID) | ORAL | 3 refills | Status: DC

## 2019-04-15 NOTE — Patient Instructions (Addendum)
Increase Omeprazole to 40 mg twice a day   Continue Anusol HC 2.5% applied sparingly twice a day   It has been recommended to you by your physician that you have a(n) endoscopy/colonoscopy completed. Per your request, we did not schedule the procedure(s) today. Please contact our office at [phone] should you decide to have the procedure completed. You will be scheduled for a pre-visit and procedure at that time.   We will refer you to Dr. Leafy Ro for weight management.    Fiber Chart  You should be consuming 25-30g of fiber per day and drinking 8 glasses of water to help your bowels move regularly.  In the chart below you can look up how much fiber you are getting in an average day.  If you are not getting enough fiber, you should add a fiber supplement to your diet.  Examples of this include Metamucil, FiberCon and Citrucel.  These can be purchased at your local grocery store or pharmacy.      [email or web address]
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
## 2019-04-22 ENCOUNTER — Encounter: Payer: Self-pay | Admitting: Gastroenterology

## 2019-04-25 LAB — HM DIABETES EYE EXAM

## 2019-05-09 ENCOUNTER — Other Ambulatory Visit: Payer: Self-pay | Admitting: *Deleted

## 2019-05-09 ENCOUNTER — Encounter: Payer: Self-pay | Admitting: *Deleted

## 2019-05-09 ENCOUNTER — Telehealth: Payer: Self-pay | Admitting: *Deleted

## 2019-05-09 DIAGNOSIS — K625 Hemorrhage of anus and rectum: Secondary | ICD-10-CM

## 2019-05-09 DIAGNOSIS — K219 Gastro-esophageal reflux disease without esophagitis: Secondary | ICD-10-CM

## 2019-05-09 NOTE — Telephone Encounter (Signed)
Spoke to patient, cancelled both pre-op visit and [facility] colon due to BMI. Patient will be contacted when Dr. Tarri Glenn December hospital availability made available.

## 2019-05-09 NOTE — Telephone Encounter (Signed)
Please see Dr.Beavers office visit. Patient needs ECL. Due to BMI=66.09 this will need to be scheduled at the hospital. Please call the patient with the appointment information. Thank you, Robbin pv

## 2019-05-09 NOTE — Telephone Encounter (Signed)
The patient has been scheduled for the following:  12/14 pre-op visit with nurse at 10:30 am  12/24 COVID screening at 10:30 am  12/28 endo/colon at 9:15 am at [facility]

## 2019-05-10 ENCOUNTER — Encounter: Payer: Self-pay | Admitting: *Deleted

## 2019-05-20 ENCOUNTER — Encounter: Payer: Self-pay | Admitting: Internal Medicine

## 2019-05-20 ENCOUNTER — Other Ambulatory Visit (INDEPENDENT_AMBULATORY_CARE_PROVIDER_SITE_OTHER): Payer: BC Managed Care – PPO

## 2019-05-20 ENCOUNTER — Other Ambulatory Visit: Payer: Self-pay

## 2019-05-20 ENCOUNTER — Ambulatory Visit (INDEPENDENT_AMBULATORY_CARE_PROVIDER_SITE_OTHER): Payer: BC Managed Care – PPO | Admitting: Internal Medicine

## 2019-05-20 VITALS — BP 130/70 | HR 90 | Temp 98.1°F | Ht 64.0 in | Wt 394.0 lb

## 2019-05-20 DIAGNOSIS — E119 Type 2 diabetes mellitus without complications: Secondary | ICD-10-CM | POA: Diagnosis not present

## 2019-05-20 DIAGNOSIS — R202 Paresthesia of skin: Secondary | ICD-10-CM | POA: Diagnosis not present

## 2019-05-20 DIAGNOSIS — R200 Anesthesia of skin: Secondary | ICD-10-CM

## 2019-05-20 DIAGNOSIS — H40003 Preglaucoma, unspecified, bilateral: Secondary | ICD-10-CM | POA: Diagnosis not present

## 2019-05-20 DIAGNOSIS — Z794 Long term (current) use of insulin: Secondary | ICD-10-CM

## 2019-05-20 DIAGNOSIS — E1169 Type 2 diabetes mellitus with other specified complication: Secondary | ICD-10-CM | POA: Insufficient documentation

## 2019-05-20 LAB — POCT GLYCOSYLATED HEMOGLOBIN (HGB A1C): Hemoglobin A1C: 5.6 % (ref 4.0–5.6)

## 2019-05-20 LAB — CBC
HCT: 35.5 % — ABNORMAL LOW (ref 36.0–46.0)
Hemoglobin: 11.9 g/dL — ABNORMAL LOW (ref 12.0–15.0)
MCHC: 33.6 g/dL (ref 30.0–36.0)
MCV: 80.4 fl (ref 78.0–100.0)
Platelets: 443 10*3/uL — ABNORMAL HIGH (ref 150.0–400.0)
RBC: 4.41 Mil/uL (ref 3.87–5.11)
RDW: 14.8 % (ref 11.5–15.5)
WBC: 12 10*3/uL — ABNORMAL HIGH (ref 4.0–10.5)

## 2019-05-20 LAB — VITAMIN B12: Vitamin B-12: 386 pg/mL (ref 211–911)

## 2019-05-20 LAB — VITAMIN D 25 HYDROXY (VIT D DEFICIENCY, FRACTURES): VITD: 33.39 ng/mL (ref 30.00–100.00)

## 2019-05-20 LAB — FERRITIN: Ferritin: 18.4 ng/mL (ref 10.0–291.0)

## 2019-05-20 LAB — T4, FREE: Free T4: 0.57 ng/dL — ABNORMAL LOW (ref 0.60–1.60)

## 2019-05-20 LAB — TSH: TSH: 2.64 u[IU]/mL (ref 0.35–4.50)

## 2019-05-20 MED ORDER — GLIMEPIRIDE 1 MG PO TABS: 1.0000 mg | ORAL_TABLET | Freq: Every day | ORAL | 1 refills | Status: DC

## 2019-05-20 NOTE — Assessment & Plan Note (Signed)
Taking lantus 20 mg BID and amaryl 2 mg daily. POC HgA1c 5.6 in the office which is overtreated. She could be having low sugars in the mornings with the headaches. Reduce amaryl to 1 mg daily and keep lantus for now and reassess. Checking B12 and thyroid and vitamin D for the numbness and tingling in left hand.

## 2019-05-20 NOTE — Patient Instructions (Signed)
We have sent in the lower dose of the amaryl so you will take 1 mg daily.   Keep the lantus the same.  We will check the labs but try sleeping with the carpal tunnel brace consistently.   Meralgia perasthesia is the numbness and tingling of the thigh.

## 2019-05-20 NOTE — Assessment & Plan Note (Signed)
Likely carpal tunnel as diabetes is well controlled now. Checking thyroid and B12 and vitamin D to exclude alternative cause.

## 2019-05-20 NOTE — Progress Notes (Signed)
Abstracted and sent to scan  

## 2019-05-20 NOTE — Progress Notes (Signed)
   Subjective:   Patient ID: Emily Phelps, female    DOB: February 15, 1981, 38 y.o.   MRN: [id number]  HPI The patient is a 38 YO female coming in for follow up of her diabetes. She is taking amaryl and lantus BID for her sugars. Was having too much diarrhea on metformin and needed to stop. She has had rare low sugars and sometimes headaches and mild nausea between breakfast and lunch. Denies checking sugars then. Has not measured a sugar lower than 70. Usually around 100 in the morning. Does have some numbness/tingling in the left hand. Has carpal tunnel brace which she has been using intermittently lately. Denies numbness in the feet. Denies excessive thirst or urination.   Review of Systems  Constitutional: Negative.   HENT: Negative.   Eyes: Negative.   Respiratory: Negative for cough, chest tightness and shortness of breath.   Cardiovascular: Negative for chest pain, palpitations and leg swelling.  Gastrointestinal: Negative for abdominal distention, abdominal pain, constipation, diarrhea, nausea and vomiting.  Musculoskeletal: Negative.   Skin: Negative.   Neurological: Positive for numbness. Negative for light-headedness.  Psychiatric/Behavioral: Negative.     Objective:  Physical Exam Constitutional:      Appearance: She is well-developed. She is obese.  HENT:     Head: Normocephalic and atraumatic.  Neck:     Musculoskeletal: Normal range of motion.  Cardiovascular:     Rate and Rhythm: Normal rate and regular rhythm.  Pulmonary:     Effort: Pulmonary effort is normal. No respiratory distress.     Breath sounds: Normal breath sounds. No wheezing or rales.  Abdominal:     General: Bowel sounds are normal. There is no distension.     Palpations: Abdomen is soft.     Tenderness: There is no abdominal tenderness. There is no rebound.  Skin:    General: Skin is warm and dry.  Neurological:     Mental Status: She is alert and oriented to person, place, and time.   Coordination: Coordination normal.     Vitals:   05/20/19 1418  BP: 130/70  Pulse: 90  Temp: 98.1 F (36.7 C)  TempSrc: Oral  SpO2: 98%  Weight: (!) 394 lb (178.7 kg)  Height: 5\' 4"  (1.626 m)    Assessment & Plan:

## 2019-05-23 ENCOUNTER — Encounter: Payer: Self-pay | Admitting: Internal Medicine

## 2019-06-03 ENCOUNTER — Encounter: Payer: BC Managed Care – PPO | Admitting: Gastroenterology

## 2019-06-03 DIAGNOSIS — Z713 Dietary counseling and surveillance: Secondary | ICD-10-CM | POA: Diagnosis not present

## 2019-06-17 DIAGNOSIS — F4323 Adjustment disorder with mixed anxiety and depressed mood: Secondary | ICD-10-CM | POA: Diagnosis not present

## 2019-06-27 ENCOUNTER — Other Ambulatory Visit: Payer: Self-pay

## 2019-06-27 ENCOUNTER — Ambulatory Visit (AMBULATORY_SURGERY_CENTER): Payer: Self-pay | Admitting: *Deleted

## 2019-06-27 VITALS — Temp 96.2°F | Ht 64.0 in | Wt >= 6400 oz

## 2019-06-27 DIAGNOSIS — K625 Hemorrhage of anus and rectum: Secondary | ICD-10-CM

## 2019-06-27 DIAGNOSIS — K219 Gastro-esophageal reflux disease without esophagitis: Secondary | ICD-10-CM

## 2019-06-27 MED ORDER — NA SULFATE-K SULFATE-MG SULF 17.5-3.13-1.6 GM/177ML PO SOLN: ORAL | 0 refills | Status: DC

## 2019-06-27 NOTE — Progress Notes (Signed)
Patient is here in-person for PV. Patient denies any allergies to eggs or soy. Patient denies any problems with anesthesia/sedation. Patient denies any oxygen use at home. Patient denies taking any diet/weight loss medications or blood thinners. Patient is not being treated for MRSA or C-diff. EMMI education assisgned to the patient for the procedure, this was explained and instructions given to patient. COVID-19 screening test is on 12/24 1030 am, the pt is aware. Pt is aware that care partner will wait in the car during procedure; if they feel like they will be too hot or cold to wait in the car; they may wait in the 4 th floor lobby. Patient is aware to bring only one care partner. We want them to wear a mask (we do not have any that we can provide them), practice social distancing, and we will check their temperatures when they get here.  I did remind the patient that their care partner needs to stay in the parking lot the entire time and have a cell phone available, we will call them when the pt is ready for discharge. Patient will wear mask into building.    Suprep $15 off coupon given to the patient.

## 2019-06-28 DIAGNOSIS — F4323 Adjustment disorder with mixed anxiety and depressed mood: Secondary | ICD-10-CM | POA: Diagnosis not present

## 2019-06-29 ENCOUNTER — Other Ambulatory Visit: Payer: Self-pay | Admitting: Internal Medicine

## 2019-06-30 ENCOUNTER — Encounter: Payer: Self-pay | Admitting: Internal Medicine

## 2019-06-30 ENCOUNTER — Other Ambulatory Visit: Payer: Self-pay | Admitting: Internal Medicine

## 2019-06-30 NOTE — Telephone Encounter (Signed)
Insulin Glargine (LANTUS) 100 UNIT/ML Solostar Pen  [facility] - [place], [place] - [address] Phone:  [phone]  Fax:  [phone]     PT called this on sat. Durene Cal e mail this am and is competly out...did not have enough for a.m. dose pls call in asap or FU with pt   Last visit 11/6

## 2019-06-30 NOTE — Telephone Encounter (Signed)
Spoke with [facility] and they are getting script ready for patient

## 2019-07-01 DIAGNOSIS — Z713 Dietary counseling and surveillance: Secondary | ICD-10-CM | POA: Diagnosis not present

## 2019-07-04 ENCOUNTER — Other Ambulatory Visit: Payer: Self-pay | Admitting: Internal Medicine

## 2019-07-07 ENCOUNTER — Other Ambulatory Visit (HOSPITAL_COMMUNITY)
Admission: RE | Admit: 2019-07-07 | Discharge: 2019-07-07 | Disposition: A | Discharge status: Home / Self Care | Payer: BC Managed Care – PPO | Source: Ambulatory Visit | Attending: Gastroenterology | Admitting: Gastroenterology

## 2019-07-07 ENCOUNTER — Other Ambulatory Visit (HOSPITAL_COMMUNITY): Payer: BC Managed Care – PPO

## 2019-07-07 DIAGNOSIS — Z20828 Contact with and (suspected) exposure to other viral communicable diseases: Secondary | ICD-10-CM | POA: Diagnosis not present

## 2019-07-07 DIAGNOSIS — Z01812 Encounter for preprocedural laboratory examination: Secondary | ICD-10-CM | POA: Insufficient documentation

## 2019-07-08 ENCOUNTER — Encounter (HOSPITAL_COMMUNITY): Payer: Self-pay | Admitting: Gastroenterology

## 2019-07-08 LAB — NOVEL CORONAVIRUS, NAA (HOSP ORDER, SEND-OUT TO REF LAB; TAT 18-24 HRS): SARS-CoV-2, NAA: NOT DETECTED

## 2019-07-08 NOTE — Anesthesia Preprocedure Evaluation (Addendum)
Anesthesia Evaluation  Patient identified by MRN, date of birth, ID band Patient awake    Reviewed: Allergy & Precautions, NPO status , Patient's Chart, lab work & pertinent test results, reviewed documented beta blocker date and time   Airway Mallampati: II  TM Distance: >3 FB Neck ROM: Full    Dental no notable dental hx.    Pulmonary shortness of breath, with exertion, at rest and lying,    Pulmonary exam normal breath sounds clear to auscultation       Cardiovascular hypertension, Pt. on medications and Pt. on home beta blockers Normal cardiovascular exam Rhythm:Regular Rate:Normal     Neuro/Psych  Headaches, PSYCHIATRIC DISORDERS Anxiety negative neurological ROS  negative psych ROS   GI/Hepatic Neg liver ROS, GERD  Medicated,  Endo/Other  negative endocrine ROSdiabetes, Well Controlled, Type 2, Insulin Dependent, Oral Hypoglycemic AgentsMorbid obesityHyperlipidemia   Renal/GU negative Renal ROS     Musculoskeletal negative musculoskeletal ROS (+)   Abdominal (+) + obese,   Peds  Hematology negative hematology ROS (+)   Anesthesia Other Findings   Reproductive/Obstetrics negative OB ROS                            Anesthesia Physical Anesthesia Plan  ASA: IV  Anesthesia Plan: MAC   Post-op Pain Management:    Induction: Intravenous  PONV Risk Score and Plan: 2 and Ondansetron, Propofol infusion and Treatment may vary due to age or medical condition  Airway Management Planned: Natural Airway and Nasal Cannula  Additional Equipment:   Intra-op Plan:   Post-operative Plan:   Informed Consent: I have reviewed the patients History and Physical, chart, labs and discussed the procedure including the risks, benefits and alternatives for the proposed anesthesia with the patient or authorized representative who has indicated his/her understanding and acceptance.     Dental  advisory given  Plan Discussed with: CRNA  Anesthesia Plan Comments:        Anesthesia Quick Evaluation

## 2019-07-11 ENCOUNTER — Encounter (HOSPITAL_COMMUNITY)
Admission: RE | Disposition: A | Discharge status: Home / Self Care | Payer: Self-pay | Source: Home / Self Care | Attending: Gastroenterology

## 2019-07-11 ENCOUNTER — Encounter (HOSPITAL_COMMUNITY): Payer: Self-pay | Admitting: Gastroenterology

## 2019-07-11 ENCOUNTER — Other Ambulatory Visit: Payer: Self-pay

## 2019-07-11 ENCOUNTER — Ambulatory Visit (HOSPITAL_COMMUNITY): Payer: BC Managed Care – PPO | Admitting: Anesthesiology

## 2019-07-11 ENCOUNTER — Ambulatory Visit (HOSPITAL_COMMUNITY)
Admission: RE | Admit: 2019-07-11 | Discharge: 2019-07-11 | Disposition: A | Discharge status: Home / Self Care | Payer: BC Managed Care – PPO | Attending: Gastroenterology | Admitting: Gastroenterology

## 2019-07-11 DIAGNOSIS — E119 Type 2 diabetes mellitus without complications: Secondary | ICD-10-CM | POA: Diagnosis not present

## 2019-07-11 DIAGNOSIS — K319 Disease of stomach and duodenum, unspecified: Secondary | ICD-10-CM | POA: Insufficient documentation

## 2019-07-11 DIAGNOSIS — K2100 Gastro-esophageal reflux disease with esophagitis, without bleeding: Secondary | ICD-10-CM | POA: Diagnosis present

## 2019-07-11 DIAGNOSIS — Z833 Family history of diabetes mellitus: Secondary | ICD-10-CM | POA: Insufficient documentation

## 2019-07-11 DIAGNOSIS — I10 Essential (primary) hypertension: Secondary | ICD-10-CM | POA: Diagnosis not present

## 2019-07-11 DIAGNOSIS — Z7951 Long term (current) use of inhaled steroids: Secondary | ICD-10-CM | POA: Insufficient documentation

## 2019-07-11 DIAGNOSIS — Z8249 Family history of ischemic heart disease and other diseases of the circulatory system: Secondary | ICD-10-CM | POA: Insufficient documentation

## 2019-07-11 DIAGNOSIS — F419 Anxiety disorder, unspecified: Secondary | ICD-10-CM | POA: Insufficient documentation

## 2019-07-11 DIAGNOSIS — R6881 Early satiety: Secondary | ICD-10-CM | POA: Diagnosis not present

## 2019-07-11 DIAGNOSIS — Z6841 Body Mass Index (BMI) 40.0 and over, adult: Secondary | ICD-10-CM | POA: Insufficient documentation

## 2019-07-11 DIAGNOSIS — Z79899 Other long term (current) drug therapy: Secondary | ICD-10-CM | POA: Diagnosis not present

## 2019-07-11 DIAGNOSIS — K625 Hemorrhage of anus and rectum: Secondary | ICD-10-CM | POA: Insufficient documentation

## 2019-07-11 DIAGNOSIS — E785 Hyperlipidemia, unspecified: Secondary | ICD-10-CM | POA: Diagnosis not present

## 2019-07-11 DIAGNOSIS — Z8371 Family history of colonic polyps: Secondary | ICD-10-CM | POA: Diagnosis not present

## 2019-07-11 DIAGNOSIS — Z794 Long term (current) use of insulin: Secondary | ICD-10-CM | POA: Diagnosis not present

## 2019-07-11 DIAGNOSIS — E669 Obesity, unspecified: Secondary | ICD-10-CM | POA: Diagnosis not present

## 2019-07-11 DIAGNOSIS — K219 Gastro-esophageal reflux disease without esophagitis: Secondary | ICD-10-CM

## 2019-07-11 DIAGNOSIS — K3189 Other diseases of stomach and duodenum: Secondary | ICD-10-CM | POA: Diagnosis not present

## 2019-07-11 DIAGNOSIS — R197 Diarrhea, unspecified: Secondary | ICD-10-CM | POA: Insufficient documentation

## 2019-07-11 HISTORY — PX: COLONOSCOPY WITH PROPOFOL: SHX5780

## 2019-07-11 HISTORY — DX: Family history of other specified conditions: Z84.89

## 2019-07-11 HISTORY — PX: BIOPSY: SHX5522

## 2019-07-11 HISTORY — PX: ESOPHAGOGASTRODUODENOSCOPY (EGD) WITH PROPOFOL: SHX5813

## 2019-07-11 LAB — GLUCOSE, CAPILLARY: Glucose-Capillary: 108 mg/dL — ABNORMAL HIGH (ref 70–99)

## 2019-07-11 SURGERY — COLONOSCOPY WITH PROPOFOL
Anesthesia: Monitor Anesthesia Care

## 2019-07-11 MED ORDER — PROPOFOL 10 MG/ML IV BOLUS
INTRAVENOUS | Status: AC
  Filled 2019-07-11: qty 20

## 2019-07-11 MED ORDER — PROPOFOL 500 MG/50ML IV EMUL
INTRAVENOUS | Status: AC
  Filled 2019-07-11: qty 50

## 2019-07-11 MED ORDER — PROPOFOL 500 MG/50ML IV EMUL
INTRAVENOUS | Status: DC | PRN
  Administered 2019-07-11: 125 ug/kg/min via INTRAVENOUS

## 2019-07-11 MED ORDER — LACTATED RINGERS IV SOLN: INTRAVENOUS | Status: DC

## 2019-07-11 MED ORDER — LIDOCAINE HCL (CARDIAC) PF 100 MG/5ML IV SOSY
PREFILLED_SYRINGE | INTRAVENOUS | Status: DC | PRN
  Administered 2019-07-11: 40 mg via INTRATRACHEAL

## 2019-07-11 MED ORDER — ONDANSETRON HCL 4 MG/2ML IJ SOLN
INTRAMUSCULAR | Status: DC | PRN
  Administered 2019-07-11: 4 mg via INTRAVENOUS

## 2019-07-11 MED ORDER — PROPOFOL 500 MG/50ML IV EMUL
INTRAVENOUS | Status: DC | PRN
  Administered 2019-07-11 (×2): 20 mg via INTRAVENOUS
  Administered 2019-07-11: 40 mg via INTRAVENOUS

## 2019-07-11 SURGICAL SUPPLY — 24 items

## 2019-07-11 NOTE — Op Note (Signed)
[facility] Patient Name: Emily Phelps Procedure Date: 07/11/2019 MRN: [id number] Attending MD: Thornton Park MD, MD Date of Birth: 26-Mar-1981 CSN: [id number] Age: 38 Admit Type: Outpatient Procedure:                Colonoscopy Indications:              Rectal bleeding Providers:                Thornton Park MD, MD, Angus Seller, Marguerita Merles, Technician, Corie Chiquito, Technician Referring MD:              Medicines:                Monitored Anesthesia Care Complications:            No immediate complications. Estimated Blood Loss:     Estimated blood loss: none. Procedure:                Pre-Anesthesia Assessment:                           - Prior to the procedure, a History and Physical                            was performed, and patient medications and                            allergies were reviewed. The patient's tolerance of                            previous anesthesia was also reviewed. The risks                            and benefits of the procedure and the sedation                            options and risks were discussed with the patient.                            All questions were answered, and informed consent                            was obtained. Prior Anticoagulants: The patient has                            taken no previous anticoagulant or antiplatelet                            agents. ASA Grade Assessment: III - A patient with                            severe systemic disease. After reviewing the risks  and benefits, the patient was deemed in                            satisfactory condition to undergo the procedure.                           After obtaining informed consent, the colonoscope                            was passed under direct vision. Throughout the                            procedure, the patient's blood pressure, pulse, and    oxygen saturations were monitored continuously. The                            [id number] Olympus colonoscope was                            introduced through the anus and advanced to the the                            cecum, identified by appendiceal orifice and                            ileocecal valve. The colonoscopy was performed                            without difficulty. The patient tolerated the                            procedure well. The quality of the bowel                            preparation was good. The ileocecal valve,                            appendiceal orifice, and rectum were photographed. Scope In: 9:37:10 AM Scope Out: 9:49:56 AM Scope Withdrawal Time: 0 hours 8 minutes 38 seconds  Total Procedure Duration: 0 hours 12 minutes 46 seconds  Findings:      The entire examined colon appeared normal on direct and retroflexion       views. No internal or external hemorrhoids identified. Impression:               - No source for recent rectal bleeding identified.                           - The entire examined colon is normal on direct and                            retroflexion views.                           - No specimens collected. Moderate Sedation:      Not Applicable -  Patient had care per Anesthesia. Recommendation:           - Patient has a contact number available for                            emergencies. The signs and symptoms of potential                            delayed complications were discussed with the                            patient. Return to normal activities tomorrow.                            Written discharge instructions were provided to the                            patient.                           - Resume previous diet.                           - Use a daily stool bulking agent such as metamucil.                           - Continue present medications.                           - Repeat colonoscopy at age 74  for screening                            purposes. Procedure Code(s):        --- Professional ---                           [phone], Colonoscopy, flexible; diagnostic, including                            collection of specimen(s) by brushing or washing,                            when performed (separate procedure) Diagnosis Code(s):        --- Professional ---                           K64.9, Unspecified hemorrhoids                           K62.5, Hemorrhage of anus and rectum CPT copyright 2019 American Medical Association. All rights reserved. The codes documented in this report are preliminary and upon coder review may  be revised to meet current compliance requirements. Thornton Park MD, MD 07/11/2019 10:06:48 AM This report has been signed electronically. Number of Addenda: 0

## 2019-07-11 NOTE — Transfer of Care (Signed)
Immediate Anesthesia Transfer of Care Note  Patient: Emily Phelps  Procedure(s) Performed: COLONOSCOPY WITH PROPOFOL (N/A ) ESOPHAGOGASTRODUODENOSCOPY (EGD) WITH PROPOFOL (N/A ) BIOPSY  Patient Location: PACU  Anesthesia Type:MAC  Level of Consciousness: drowsy, patient cooperative and responds to stimulation  Airway & Oxygen Therapy: Patient Spontanous Breathing and Patient connected to face mask oxygen  Post-op Assessment: Report given to RN and Post -op Vital signs reviewed and stable  Post vital signs: Reviewed and stable  Last Vitals:  Vitals Value Taken Time  BP    Temp    Pulse    Resp    SpO2      Last Pain:  Vitals:   07/11/19 0756  TempSrc: Oral  PainSc: 0-No pain         Complications: No apparent anesthesia complications

## 2019-07-11 NOTE — Anesthesia Postprocedure Evaluation (Signed)
Anesthesia Post Note  Patient: Emily Phelps  Procedure(s) Performed: COLONOSCOPY WITH PROPOFOL (N/A ) ESOPHAGOGASTRODUODENOSCOPY (EGD) WITH PROPOFOL (N/A ) BIOPSY     Patient location during evaluation: PACU Anesthesia Type: MAC Level of consciousness: awake and alert Pain management: pain level controlled Vital Signs Assessment: post-procedure vital signs reviewed and stable Respiratory status: spontaneous breathing Cardiovascular status: stable Anesthetic complications: no    Last Vitals:  Vitals:   07/11/19 1030 07/11/19 1035  BP: 139/77 139/77  Pulse: 71 78  Resp: 15 17  Temp:    SpO2: 99% 100%    Last Pain:  Vitals:   07/11/19 1035  TempSrc:   PainSc: 0-No pain                 Nolon Nations

## 2019-07-11 NOTE — Discharge Instructions (Signed)
YOU HAD AN ENDOSCOPIC PROCEDURE TODAY: Refer to the procedure report and other information in the discharge instructions given to you for any specific questions about what was found during the examination. If this information does not answer your questions, please call [place] office at [phone] to clarify.  ° °YOU SHOULD EXPECT: Some feelings of bloating in the abdomen. Passage of more gas than usual. Walking can help get rid of the air that was put into your GI tract during the procedure and reduce the bloating. If you had a lower endoscopy (such as a colonoscopy or flexible sigmoidoscopy) you may notice spotting of blood in your stool or on the toilet paper. Some abdominal soreness may be present for a day or two, also. ° °DIET: Your first meal following the procedure should be a light meal and then it is ok to progress to your normal diet. A half-sandwich or bowl of soup is an example of a good first meal. Heavy or fried foods are harder to digest and may make you feel nauseous or bloated. Drink plenty of fluids but you should avoid alcoholic beverages for 24 hours. If you had a esophageal dilation, please see attached instructions for diet.   ° °ACTIVITY: Your care partner should take you home directly after the procedure. You should plan to take it easy, moving slowly for the rest of the day. You can resume normal activity the day after the procedure however YOU SHOULD NOT DRIVE, use power tools, machinery or perform tasks that involve climbing or major physical exertion for 24 hours (because of the sedation medicines used during the test).  ° °SYMPTOMS TO REPORT IMMEDIATELY: °A gastroenterologist can be reached at any hour. Please call [phone]  for any of the following symptoms:  °Following lower endoscopy (colonoscopy, flexible sigmoidoscopy) °Excessive amounts of blood in the stool  °Significant tenderness, worsening of abdominal pains  °Swelling of the abdomen that is new, acute  °Fever of 100° or  higher  °Following upper endoscopy (EGD, EUS, ERCP, esophageal dilation) °Vomiting of blood or coffee ground material  °New, significant abdominal pain  °New, significant chest pain or pain under the shoulder blades  °Painful or persistently difficult swallowing  °New shortness of breath  °Black, tarry-looking or red, bloody stools ° °FOLLOW UP:  °If any biopsies were taken you will be contacted by phone or by letter within the next 1-3 weeks. Call [phone]  if you have not heard about the biopsies in 3 weeks.  °Please also call with any specific questions about appointments or follow up tests. ° °

## 2019-07-11 NOTE — Op Note (Signed)
[facility] Patient Name: Emily Phelps Procedure Date: 07/11/2019 MRN: [id number] Attending MD: Thornton Park MD, MD Date of Birth: 1981-04-09 CSN: [id number] Age: 38 Admit Type: Outpatient Procedure:                Upper GI endoscopy Indications:              Esophageal reflux symptoms that persist despite                            appropriate therapy Providers:                Thornton Park MD, MD, Angus Seller, Marguerita Merles, Technician, Corie Chiquito, Technician Referring MD:              Medicines:                Monitored Anesthesia Care Complications:            No immediate complications. Estimated blood loss:                            Minimal. Estimated Blood Loss:     Estimated blood loss was minimal. Procedure:                Pre-Anesthesia Assessment:                           - Prior to the procedure, a History and Physical                            was performed, and patient medications and                            allergies were reviewed. The patient's tolerance of                            previous anesthesia was also reviewed. The risks                            and benefits of the procedure and the sedation                            options and risks were discussed with the patient.                            All questions were answered, and informed consent                            was obtained. Prior Anticoagulants: The patient has                            taken no previous anticoagulant or antiplatelet                            agents. ASA Grade  Assessment: III - A patient with                            severe systemic disease. After reviewing the risks                            and benefits, the patient was deemed in                            satisfactory condition to undergo the procedure.                           After obtaining informed consent, the endoscope was   passed under direct vision. Throughout the                            procedure, the patient's blood pressure, pulse, and                            oxygen saturations were monitored continuously. The                            [id number]-[id number] Olympus gastroscope was                            introduced through the mouth, and advanced to the                            third part of duodenum. The upper GI endoscopy was                            accomplished without difficulty. The patient                            tolerated the procedure well. Scope In: Scope Out: Findings:      The examined esophagus was normal. Biopsies were taken from the proximal       and mid/distal esophagus with a cold forceps for histology. Estimated       blood loss was minimal.      The entire examined stomach was normal. Biopsies were taken from the       antrum, body, and fundus with a cold forceps for histology. Estimated       blood loss was minimal.      The examined duodenum was normal.      The cardia and gastric fundus were normal on retroflexion.      The exam was otherwise without abnormality. Impression:               - Normal esophagus. Biopsied.                           - Normal stomach. Biopsied.                           - Normal examined duodenum.                           -  The examination was otherwise normal. Moderate Sedation:      Not Applicable - Patient had care per Anesthesia. Recommendation:           - Patient has a contact number available for                            emergencies. The signs and symptoms of potential                            delayed complications were discussed with the                            patient. Return to normal activities tomorrow.                            Written discharge instructions were provided to the                            patient.                           - Resume previous diet.                           - Continue present  medications.                           - Await pathology results.                           - Proceed with colonoscopy today as previously                            planned. Procedure Code(s):        --- Professional ---                           [phone], Esophagogastroduodenoscopy, flexible,                            transoral; with biopsy, single or multiple Diagnosis Code(s):        --- Professional ---                           K21.9, Gastro-esophageal reflux disease without                            esophagitis CPT copyright 2019 American Medical Association. All rights reserved. The codes documented in this report are preliminary and upon coder review may  be revised to meet current compliance requirements. Thornton Park MD, MD 07/11/2019 10:02:54 AM This report has been signed electronically. Number of Addenda: 0

## 2019-07-11 NOTE — Anesthesia Procedure Notes (Signed)
Date/Time: 07/11/2019 9:10 AM Performed by: Glory Buff, CRNA Oxygen Delivery Method: Simple face mask
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
## 2019-07-12 ENCOUNTER — Encounter: Payer: Self-pay | Admitting: Internal Medicine

## 2019-07-12 ENCOUNTER — Other Ambulatory Visit: Payer: Self-pay

## 2019-07-12 LAB — SURGICAL PATHOLOGY

## 2019-07-13 ENCOUNTER — Encounter: Payer: Self-pay | Admitting: *Deleted

## 2019-07-14 DIAGNOSIS — F4323 Adjustment disorder with mixed anxiety and depressed mood: Secondary | ICD-10-CM | POA: Diagnosis not present

## 2019-07-16 ENCOUNTER — Other Ambulatory Visit: Payer: Self-pay | Admitting: Internal Medicine

## 2019-07-22 DIAGNOSIS — Z713 Dietary counseling and surveillance: Secondary | ICD-10-CM | POA: Diagnosis not present

## 2019-07-29 ENCOUNTER — Other Ambulatory Visit: Payer: Self-pay

## 2019-07-29 MED ORDER — OMEPRAZOLE 40 MG PO CPDR: 40.0000 mg | DELAYED_RELEASE_CAPSULE | Freq: Two times a day (BID) | ORAL | 0 refills | Status: DC

## 2019-08-01 ENCOUNTER — Other Ambulatory Visit: Payer: Self-pay

## 2019-08-01 MED ORDER — OMEPRAZOLE 40 MG PO CPDR: 40.0000 mg | DELAYED_RELEASE_CAPSULE | Freq: Two times a day (BID) | ORAL | 0 refills | Status: DC

## 2019-08-05 DIAGNOSIS — F4323 Adjustment disorder with mixed anxiety and depressed mood: Secondary | ICD-10-CM | POA: Diagnosis not present

## 2019-08-05 DIAGNOSIS — Z713 Dietary counseling and surveillance: Secondary | ICD-10-CM | POA: Diagnosis not present

## 2019-08-19 DIAGNOSIS — F4323 Adjustment disorder with mixed anxiety and depressed mood: Secondary | ICD-10-CM | POA: Diagnosis not present

## 2019-08-19 DIAGNOSIS — Z713 Dietary counseling and surveillance: Secondary | ICD-10-CM | POA: Diagnosis not present

## 2019-09-03 ENCOUNTER — Other Ambulatory Visit: Payer: Self-pay | Admitting: Internal Medicine

## 2019-09-09 ENCOUNTER — Encounter: Payer: Self-pay | Admitting: Gastroenterology

## 2019-09-09 ENCOUNTER — Other Ambulatory Visit (INDEPENDENT_AMBULATORY_CARE_PROVIDER_SITE_OTHER): Payer: BC Managed Care – PPO

## 2019-09-09 ENCOUNTER — Ambulatory Visit (INDEPENDENT_AMBULATORY_CARE_PROVIDER_SITE_OTHER): Payer: BC Managed Care – PPO | Admitting: Gastroenterology

## 2019-09-09 VITALS — BP 124/74 | HR 70 | Temp 98.5°F | Ht 64.0 in | Wt >= 6400 oz

## 2019-09-09 DIAGNOSIS — K219 Gastro-esophageal reflux disease without esophagitis: Secondary | ICD-10-CM

## 2019-09-09 DIAGNOSIS — R6881 Early satiety: Secondary | ICD-10-CM | POA: Diagnosis not present

## 2019-09-09 DIAGNOSIS — K2990 Gastroduodenitis, unspecified, without bleeding: Secondary | ICD-10-CM

## 2019-09-09 DIAGNOSIS — K625 Hemorrhage of anus and rectum: Secondary | ICD-10-CM

## 2019-09-09 DIAGNOSIS — F4323 Adjustment disorder with mixed anxiety and depressed mood: Secondary | ICD-10-CM | POA: Diagnosis not present

## 2019-09-09 DIAGNOSIS — K2970 Gastritis, unspecified, without bleeding: Secondary | ICD-10-CM | POA: Diagnosis not present

## 2019-09-09 DIAGNOSIS — Z713 Dietary counseling and surveillance: Secondary | ICD-10-CM | POA: Diagnosis not present

## 2019-09-09 LAB — IGA: IgA: 153 mg/dL (ref 68–378)
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
## 2019-09-09 NOTE — Patient Instructions (Signed)
Continue Omeprazole 40 mg twice a day   Your provider has requested that you go to the basement level for lab work before leaving today. Press "B" on the elevator. The lab is located at the first door on the left as you exit the elevator.  A high fiber diet with plenty of fluids (up to 8 glasses of water daily) is suggested to relieve these symptoms.  Metamucil, 1 tablespoon once or twice daily can be used to keep bowels regular if needed.  Follow up in 3 months.

## 2019-09-12 ENCOUNTER — Other Ambulatory Visit: Payer: Self-pay | Admitting: Internal Medicine

## 2019-09-12 ENCOUNTER — Encounter: Payer: Self-pay | Admitting: *Deleted

## 2019-09-12 LAB — TISSUE TRANSGLUTAMINASE, IGA: (tTG) Ab, IgA: 1 U/mL

## 2019-09-14 ENCOUNTER — Other Ambulatory Visit: Payer: Self-pay | Admitting: Internal Medicine

## 2019-09-19 ENCOUNTER — Encounter: Payer: Self-pay | Admitting: Internal Medicine

## 2019-09-19 ENCOUNTER — Ambulatory Visit (INDEPENDENT_AMBULATORY_CARE_PROVIDER_SITE_OTHER): Payer: BC Managed Care – PPO | Admitting: Internal Medicine

## 2019-09-19 DIAGNOSIS — H44009 Unspecified purulent endophthalmitis, unspecified eye: Secondary | ICD-10-CM | POA: Insufficient documentation

## 2019-09-19 DIAGNOSIS — H44003 Unspecified purulent endophthalmitis, bilateral: Secondary | ICD-10-CM

## 2019-09-19 DIAGNOSIS — J329 Chronic sinusitis, unspecified: Secondary | ICD-10-CM | POA: Insufficient documentation

## 2019-09-19 MED ORDER — FLUCONAZOLE 150 MG PO TABS: 150.0000 mg | ORAL_TABLET | ORAL | 0 refills | Status: DC

## 2019-09-19 MED ORDER — AMOXICILLIN-POT CLAVULANATE 875-125 MG PO TABS: 1.0000 | ORAL_TABLET | Freq: Two times a day (BID) | ORAL | 0 refills | Status: DC

## 2019-09-19 NOTE — Assessment & Plan Note (Signed)
Suspect bacterial as persistent past 1 week and crusting. Rx augmentin to also cover lung congestion. Rx diflucan in case yeast infection. Use glasses until better then can use contacts again.

## 2019-09-19 NOTE — Progress Notes (Signed)
Virtual Visit via Video Note  I connected with Emily Phelps on 09/19/19 at  2:40 PM EST by a video enabled telemedicine application and verified that I am speaking with the correct person using two identifiers.  The patient and the provider were at separate locations throughout the entire encounter.   I discussed the limitations of evaluation and management by telemedicine and the availability of in person appointments. The patient expressed understanding and agreed to proceed. The patient and the provider were the only parties present for the visit unless noted in HPI below.  History of Present Illness: The patient is a 39 y.o. female with visit for eye crusting. Started about 1 week or more ago. Overall worsening. Has no fevers or chills. Some cough and chest congestion as well. Sinuses not blocked or hurting but some drainage. Taking her allegra and allergy medication. Eyes crusted over in the morning, no vision changes. Now crusting throughout the day as well. Denies SOB. Overall it is not improving and is worsening. Has tried allergy medications without relief and warm water  Observations/Objective: Appearance: normal, breathing appears normal, casual grooming, abdomen does not appear distended, throat normal, eyes red with slight crusting wearing glasses, memory normal, mental status is A and O times 3  Assessment and Plan: See problem oriented charting  Follow Up Instructions: rx augmentin and diflucan  I discussed the assessment and treatment plan with the patient. The patient was provided an opportunity to ask questions and all were answered. The patient agreed with the plan and demonstrated an understanding of the instructions.   The patient was advised to call back or seek an in-person evaluation if the symptoms worsen or if the condition fails to improve as anticipated.  Hoyt Koch, MD

## 2019-09-22 ENCOUNTER — Other Ambulatory Visit: Payer: Self-pay | Admitting: Internal Medicine

## 2019-10-06 ENCOUNTER — Encounter: Payer: Self-pay | Admitting: Internal Medicine

## 2019-10-07 ENCOUNTER — Ambulatory Visit (INDEPENDENT_AMBULATORY_CARE_PROVIDER_SITE_OTHER): Payer: BC Managed Care – PPO

## 2019-10-07 ENCOUNTER — Other Ambulatory Visit: Payer: Self-pay | Admitting: Internal Medicine

## 2019-10-07 ENCOUNTER — Ambulatory Visit (INDEPENDENT_AMBULATORY_CARE_PROVIDER_SITE_OTHER): Payer: BC Managed Care – PPO | Admitting: Internal Medicine

## 2019-10-07 VITALS — BP 148/90 | HR 97 | Temp 99.5°F | Resp 22 | Ht 64.0 in | Wt 395.1 lb

## 2019-10-07 DIAGNOSIS — Z20822 Contact with and (suspected) exposure to covid-19: Secondary | ICD-10-CM

## 2019-10-07 DIAGNOSIS — Z87898 Personal history of other specified conditions: Secondary | ICD-10-CM

## 2019-10-07 DIAGNOSIS — R05 Cough: Secondary | ICD-10-CM | POA: Diagnosis not present

## 2019-10-07 DIAGNOSIS — R509 Fever, unspecified: Secondary | ICD-10-CM | POA: Diagnosis not present

## 2019-10-07 MED ORDER — PREDNISONE 20 MG PO TABS: 20.0000 mg | ORAL_TABLET | Freq: Two times a day (BID) | ORAL | 0 refills | Status: DC

## 2019-10-07 MED ORDER — DOXYCYCLINE HYCLATE 100 MG PO TABS: 100.0000 mg | ORAL_TABLET | Freq: Two times a day (BID) | ORAL | 0 refills | Status: DC

## 2019-10-07 NOTE — Patient Instructions (Addendum)
#  1 practice 3 Ws(wait 6 feet apart, wash hands regularly, wear mask) #2 zinc lozenges 5 times a day or zinc sulfate 220 mg daily x5 days #3 self quarantine 10-14 days.  ASA 81 mg daily  Doxycycline 100 mg p.o. twice daily x5 days Prednisone 20 mg twice a day with meals X 5 days. Reference: American Journal of Medicine, Volume 134, #1, January 2021 pages 16-22; [facility]  You should have an official Sleep Apnea evaluation .

## 2019-10-08 DIAGNOSIS — G4733 Obstructive sleep apnea (adult) (pediatric): Secondary | ICD-10-CM | POA: Insufficient documentation

## 2019-10-08 DIAGNOSIS — Z20822 Contact with and (suspected) exposure to covid-19: Secondary | ICD-10-CM | POA: Insufficient documentation

## 2019-10-08 NOTE — Progress Notes (Unsigned)
Respiratory Clinic Note   Patient: Emily Phelps Female    DOB: Jun 16, 1981   39 y.o.   MRN: [id number] Visit Date: 10/07/2019  Today's Provider: Pascal Lux MD   Subjective: Cough and body aches    Covid-19 Nucleic Acid Test Results Lab Results  Component Value Date   SARSCOV2NAA NOT DETECTED 07/07/2019   [place] NEGATIVE 03/11/2019   [place] Not Detected 03/03/2019    HPI: The patient presents with fever and chills intermittently in the last 48 hours.  She did not actually document the fever with a thermometer.   She believes that she has has had Covid exposure over the past weekend in the context of traveling with a group of friends to [place].  Apparently some of the members of that group have tested positive. She also has nonproductive cough and shortness of breath, sweats, fatigue, body aches, headache, nasal congestion, and abdominal pain. She denies wheezing, loss of taste or smell, sore throat, nausea/vomiting, diarrhea, or mental status changes. She has been using Coricidin and Robitussin. Covid testing was performed at [facility] yesterday and the results are pending. She stated that she was treated for a sinus infection within the last 2 weeks with Augmentin.      Current Outpatient Medications:  .  atenolol (TENORMIN) 50 MG tablet, Take 1 tablet (50 mg total) by mouth daily. Follow=up appt is due must see provider for future refills, Disp: 30 tablet, Rfl: 0 .  CONTOUR NEXT TEST test strip, Use to check blood sugar level three times daily as directed, Disp: 200 strip, Rfl: 1 .  Cyanocobalamin (VITAMIN B12) 1000 MCG TBCR, Take 1,000 mcg by mouth daily. , Disp: , Rfl:  .  doxycycline (VIBRA-TABS) 100 MG tablet, Take 1 tablet (100 mg total) by mouth 2 (two) times daily., Disp: 10 tablet, Rfl: 0 .  fexofenadine (ALLEGRA) 180 MG tablet, TAKE 1 TABLET BY MOUTH EVERY DAY, Disp: 90 tablet, Rfl: 1 .  fluticasone (FLONASE) 50 MCG/ACT nasal spray, Place 2 sprays into both  nostrils daily. (Patient taking differently: Place 2 sprays into both nostrils daily as needed for allergies. ), Disp: 16 g, Rfl: 6 .  glimepiride (AMARYL) 1 MG tablet, TAKE 1 TABLET BY MOUTH EVERY DAY WITH breakfast, Disp: 90 tablet, Rfl: 1 .  hydrochlorothiazide (HYDRODIURIL) 25 MG tablet, TAKE 1 TABLET BY MOUTH EVERY DAY, Disp: 90 tablet, Rfl: 3 .  Insulin Pen Needle (PEN NEEDLES) 31G X 8 MM MISC, Use twice a day to inject insulin as instructed., Disp: 100 each, Rfl: 3 .  LANTUS SOLOSTAR 100 UNIT/ML Solostar Pen, INJECT 35 UNITS INTO THE SKIN 2 TIMES DAILY, Disp: 15 mL, Rfl: 11 .  Liver Extract (LIVER PO), Take by mouth. Liver focus vitamin daily, Disp: , Rfl:  .  losartan (COZAAR) 100 MG tablet, TAKE 1 TABLET BY MOUTH EVERY DAY, Disp: 90 tablet, Rfl: 3 .  Microlet Lancets MISC, CHECK BLOOD SUGAR LEVEL THREE TIMES DAILY AS DIRECTED, Disp: 200 each, Rfl: 1 .  Multiple Vitamin (MULTIVITAMIN WITH MINERALS) TABS tablet, Take 1 tablet by mouth daily., Disp: , Rfl:  .  Na Sulfate-K Sulfate-Mg Sulf 17.5-3.13-1.6 GM/177ML SOLN, Suprep (no substitutions)-TAKE AS DIRECTED., Disp: 354 mL, Rfl: 0 .  omeprazole (PRILOSEC) 40 MG capsule, Take 1 capsule (40 mg total) by mouth 2 (two) times daily. Please keep your February appointment with Dr. Tarri Glenn., Disp: 60 capsule, Rfl: 0 .  predniSONE (DELTASONE) 20 MG tablet, Take 1 tablet (20 mg total) by mouth 2 (two) times  daily with a meal., Disp: 10 tablet, Rfl: 0 .  PREVIFEM 0.25-35 MG-MCG tablet, TAKE 1 TABLET BY MOUTH EVERY DAY, Disp: 28 tablet, Rfl: 11 .  topiramate (TOPAMAX) 50 MG tablet, TAKE 1 TABLET BY MOUTH 2 TIMES DAILY, Disp: 180 tablet, Rfl: 3 .  VITAMIN D PO, Take 1 tablet by mouth daily. , Disp: , Rfl:   No Known Allergies  Review of Systems :  Constitutional: No fever, significant weight change, fatigue  Eyes: No redness, discharge, pain, vision change ENT/mouth: No nasal congestion,  purulent discharge, earache, change in hearing, sore throat    Cardiovascular: No chest pain, palpitations, paroxysmal nocturnal dyspnea, claudication, edema  Respiratory: No cough, sputum production, hemoptysis, DOE, significant snoring, apnea   Gastrointestinal: No heartburn, dysphagia, abdominal pain, nausea /vomiting, rectal bleeding, melena, change in bowels Genitourinary: No dysuria, hematuria, pyuria, incontinence, nocturia Musculoskeletal: No joint stiffness, joint swelling, weakness, pain Dermatologic: No rash, pruritus, change in appearance of skin Neurologic: No dizziness, headache, syncope, seizures, numbness, tingling Psychiatric: No significant anxiety, depression, insomnia, anorexia Endocrine: No change in hair/skin/nails, excessive thirst, excessive hunger, excessive urination  Hematologic/lymphatic: No significant bruising, lymphadenopathy, abnormal bleeding Allergy/immunology: No itchy/watery eyes, significant sneezing, urticaria, angioedema    Objective:   LMP 07/10/2019   Physical Exam   Pertinent or positive findings: General appearance: Adequately nourished; no acute distress, increased work of breathing is present.   Lymphatic: No lymphadenopathy about the head, neck, axilla. Eyes: No conjunctival inflammation or lid edema is present. There is no scleral icterus. Ears:  External ear exam shows no significant lesions or deformities.   Nose:  External nasal examination shows no deformity or inflammation. Nasal mucosa are pink and moist without lesions, exudates Oral exam:  Lips and gums are healthy appearing. There is no oropharyngeal erythema or exudate. Neck:  No thyromegaly, masses, tenderness noted.    Heart:  Normal rate and regular rhythm. S1 and S2 normal without gallop, murmur, click, rub .  Lungs: Chest clear to auscultation without wheezes, rhonchi, rales, rubs. Abdomen: Bowel sounds are normal. Abdomen is soft and nontender with no organomegaly, hernias, masses. GU: Deferred  Extremities:  No cyanosis, clubbing,  edema  Neurologic exam : Cn 2-7 intact Strength equal  in upper & lower extremities Balance, Rhomberg, finger to nose testing could not be completed due to clinical state Deep tendon reflexes are equal Skin: Warm & dry w/o tenting. No significant lesions or rash.  No results found for any visits on 07/13/19.    Assessment & Plan    See summary under each active problem in the Problem List with associated updated therapeutic plan     [facility]

## 2019-10-08 NOTE — Progress Notes (Signed)
Patient ID: Emily Phelps, female   DOB: Jan 02, 1981, 39 y.o.   MRN: [id number]   Respiratory Clinic Note   Patient: Emily Phelps Female    DOB: 17-May-1981   39 y.o.   MRN: [id number] Visit Date: 10/07/2019  Today's Provider: Pascal Lux MD, [facility]    Subjective: Possible Covid exposure    Covid-19 Nucleic Acid Test Results Lab Results  Component Value Date   SARSCOV2NAA NOT DETECTED 07/07/2019   [place] NEGATIVE 03/11/2019   [place] Not Detected 03/03/2019    HPI : The patient traveled to [place] with a group of friends the past week.  She has learned that some of the women in the group have been tested positive for Covid.   In the last 48 hours she has had fever and chills.  She did not document the fever with a thermometer.  She also describes cough and shortness of breath as well as fatigue, body aches, headache, nasal congestion, and abdominal pain.  [facility]; results are pending. She has been taking Coricidin and Robitussin for the symptoms.   She was treated for a sinus infection in the past 2 weeks with Augmentin.  Significantly she does have a history of snoring and possible apnea but has not had a definitive evaluation.  She states that her glucoses typically run 80-100 but today the glucose was 146.    Current Outpatient Medications:  .  atenolol (TENORMIN) 50 MG tablet, Take 1 tablet (50 mg total) by mouth daily. Follow=up appt is due must see provider for future refills, Disp: 30 tablet, Rfl: 0 .  CONTOUR NEXT TEST test strip, Use to check blood sugar level three times daily as directed, Disp: 200 strip, Rfl: 1 .  Cyanocobalamin (VITAMIN B12) 1000 MCG TBCR, Take 1,000 mcg by mouth daily. , Disp: , Rfl:  .  doxycycline (VIBRA-TABS) 100 MG tablet, Take 1 tablet (100 mg total) by mouth 2 (two) times daily., Disp: 10 tablet, Rfl: 0 .  fexofenadine (ALLEGRA) 180 MG tablet, TAKE 1 TABLET BY MOUTH EVERY DAY, Disp:  90 tablet, Rfl: 1 .  fluticasone (FLONASE) 50 MCG/ACT nasal spray, Place 2 sprays into both nostrils daily. (Patient taking differently: Place 2 sprays into both nostrils daily as needed for allergies. ), Disp: 16 g, Rfl: 6 .  glimepiride (AMARYL) 1 MG tablet, TAKE 1 TABLET BY MOUTH EVERY DAY WITH breakfast, Disp: 90 tablet, Rfl: 1 .  hydrochlorothiazide (HYDRODIURIL) 25 MG tablet, TAKE 1 TABLET BY MOUTH EVERY DAY, Disp: 90 tablet, Rfl: 3 .  Insulin Pen Needle (PEN NEEDLES) 31G X 8 MM MISC, Use twice a day to inject insulin as instructed., Disp: 100 each, Rfl: 3 .  LANTUS SOLOSTAR 100 UNIT/ML Solostar Pen, INJECT 35 UNITS INTO THE SKIN 2 TIMES DAILY, Disp: 15 mL, Rfl: 11 .  Liver Extract (LIVER PO), Take by mouth. Liver focus vitamin daily, Disp: , Rfl:  .  losartan (COZAAR) 100 MG tablet, TAKE 1 TABLET BY MOUTH EVERY DAY, Disp: 90 tablet, Rfl: 3 .  Microlet Lancets MISC, CHECK BLOOD SUGAR LEVEL THREE TIMES DAILY AS DIRECTED, Disp: 200 each, Rfl: 1 .  Multiple Vitamin (MULTIVITAMIN WITH MINERALS) TABS tablet, Take 1 tablet by mouth daily., Disp: , Rfl:  .  Na Sulfate-K Sulfate-Mg Sulf 17.5-3.13-1.6 GM/177ML SOLN, Suprep (no substitutions)-TAKE AS DIRECTED., Disp: 354 mL, Rfl: 0 .  omeprazole (PRILOSEC) 40 MG capsule, Take 1 capsule (40 mg total) by mouth 2 (two) times daily.  Please keep your February appointment with Dr. Tarri Glenn., Disp: 60 capsule, Rfl: 0 .  predniSONE (DELTASONE) 20 MG tablet, Take 1 tablet (20 mg total) by mouth 2 (two) times daily with a meal., Disp: 10 tablet, Rfl: 0 .  PREVIFEM 0.25-35 MG-MCG tablet, TAKE 1 TABLET BY MOUTH EVERY DAY, Disp: 28 tablet, Rfl: 11 .  topiramate (TOPAMAX) 50 MG tablet, TAKE 1 TABLET BY MOUTH 2 TIMES DAILY, Disp: 180 tablet, Rfl: 3 .  VITAMIN D PO, Take 1 tablet by mouth daily. , Disp: , Rfl:   No Known Allergies  Review of Systems   Constitutional: No significant weight change  Eyes: No redness, discharge, pain, vision change ENT/mouth: No  purulent discharge, earache, change in hearing, sore throat  Cardiovascular: No chest pain, palpitations, paroxysmal nocturnal dyspnea, claudication, edema  Respiratory: No sputum production, hemoptysis Gastrointestinal: No heartburn, dysphagia, nausea /vomiting, rectal bleeding, melena, change in bowels Genitourinary: No dysuria, hematuria, pyuria, incontinence, nocturia Dermatologic: No rash, pruritus, change in appearance of skin Neurologic: No dizziness, headache, syncope, seizures, numbness, tingling Endocrine: No change in hair/skin/nails, excessive thirst, excessive hunger, excessive urination  Hematologic/lymphatic: No significant bruising, lymphadenopathy, abnormal bleeding Allergy/immunology: No itchy/watery eyes, significant sneezing, urticaria, angioedema     Objective:   BP (!) 148/90 (BP Location: Left Arm, Patient Position: Sitting, Cuff Size: Large)   Pulse 97   Temp 99.5 F (37.5 C) (Oral)   Resp (!) 22   Ht 5\' 4"  (1.626 m)   Wt (!) 395 lb 2 oz (179.2 kg)   LMP  (LMP Unknown)   SpO2 94%   BMI 67.82 kg/m   Physical Exam Pertinent or positive findings: She is morbidly obese.  There is erythema of the nasal mucosa.  The oropharynx could not be visualized.  Heart sounds are distant.  Breath sounds are decreased.  She has an elaborate script tattoo over the lumbosacral area.  Pedal pulses are decreased.  She has nonpitting pedal edema.  General appearance: Adequately nourished; no acute distress, increased work of breathing is present.   Lymphatic: No lymphadenopathy about the head, neck, axilla. Eyes: No conjunctival inflammation or lid edema is present. There is no scleral icterus. Ears:  External ear exam shows no significant lesions or deformities.   Nose:  External nasal examination shows no deformity or inflammation. Nasal mucosa are pink and moist without lesions, exudates Oral exam:  Lips and gums are healthy appearing.  Neck:  No thyromegaly, masses, tenderness  noted.    Heart:  Normal rate and regular rhythm. S1 and S2 normal without gallop, murmur, click, rub .  Lungs: Chest clear to auscultation without wheezes, rhonchi, rales, rubs. Abdomen: Bowel sounds are normal. Abdomen is soft and nontender with no organomegaly, hernias, masses. GU: Deferred  Extremities:  No cyanosis, clubbing Skin: Warm & dry w/o tenting. No significant lesions or rash.       Assessment & Plan    See summary under each active problem in the Problem List with associated updated therapeutic plan

## 2019-10-08 NOTE — Assessment & Plan Note (Signed)
COVID-19 test results are pending; performed [facility]. In the interim she was placed on antibiotics and steroids Chest x-ray suggested borderline cardiomegaly and vascular accentuation.  There was possibility of a left effusion.  This is in the context of history of loud snoring and possible sleep apnea.  No definite infiltrate present to suggest Covid pneumonia.

## 2019-10-08 NOTE — Assessment & Plan Note (Signed)
10/07/2019 crowded oropharynx and morbid obesity in the context of history of snoring and possible apnea warrants formal OSA evaluation.

## 2019-10-09 ENCOUNTER — Other Ambulatory Visit: Payer: Self-pay

## 2019-10-09 ENCOUNTER — Emergency Department (HOSPITAL_COMMUNITY): Payer: BC Managed Care – PPO

## 2019-10-09 ENCOUNTER — Emergency Department (HOSPITAL_COMMUNITY)
Admission: EM | Admit: 2019-10-09 | Discharge: 2019-10-10 | Disposition: A | Discharge status: Home / Self Care | Payer: BC Managed Care – PPO | Attending: Emergency Medicine | Admitting: Emergency Medicine

## 2019-10-09 DIAGNOSIS — R0602 Shortness of breath: Secondary | ICD-10-CM | POA: Diagnosis not present

## 2019-10-09 DIAGNOSIS — R0689 Other abnormalities of breathing: Secondary | ICD-10-CM | POA: Diagnosis not present

## 2019-10-09 DIAGNOSIS — E669 Obesity, unspecified: Secondary | ICD-10-CM | POA: Insufficient documentation

## 2019-10-09 DIAGNOSIS — J1282 Pneumonia due to coronavirus disease 2019: Secondary | ICD-10-CM | POA: Insufficient documentation

## 2019-10-09 DIAGNOSIS — R05 Cough: Secondary | ICD-10-CM | POA: Diagnosis not present

## 2019-10-09 DIAGNOSIS — R52 Pain, unspecified: Secondary | ICD-10-CM | POA: Diagnosis not present

## 2019-10-09 DIAGNOSIS — I10 Essential (primary) hypertension: Secondary | ICD-10-CM | POA: Insufficient documentation

## 2019-10-09 DIAGNOSIS — U071 COVID-19: Secondary | ICD-10-CM | POA: Diagnosis not present

## 2019-10-09 DIAGNOSIS — R0789 Other chest pain: Secondary | ICD-10-CM | POA: Insufficient documentation

## 2019-10-09 DIAGNOSIS — Z79899 Other long term (current) drug therapy: Secondary | ICD-10-CM | POA: Insufficient documentation

## 2019-10-09 DIAGNOSIS — Z6841 Body Mass Index (BMI) 40.0 and over, adult: Secondary | ICD-10-CM | POA: Diagnosis not present

## 2019-10-09 DIAGNOSIS — R110 Nausea: Secondary | ICD-10-CM | POA: Diagnosis not present

## 2019-10-09 DIAGNOSIS — J189 Pneumonia, unspecified organism: Secondary | ICD-10-CM | POA: Diagnosis not present

## 2019-10-09 LAB — CBC
HCT: 37.2 % (ref 36.0–46.0)
Hemoglobin: 12.3 g/dL (ref 12.0–15.0)
MCH: 25.3 pg — ABNORMAL LOW (ref 26.0–34.0)
MCHC: 33.1 g/dL (ref 30.0–36.0)
MCV: 76.5 fL — ABNORMAL LOW (ref 80.0–100.0)
Platelets: 376 10*3/uL (ref 150–400)
RBC: 4.86 MIL/uL (ref 3.87–5.11)
RDW: 17 % — ABNORMAL HIGH (ref 11.5–15.5)
WBC: 12.3 10*3/uL — ABNORMAL HIGH (ref 4.0–10.5)
nRBC: 0 % (ref 0.0–0.2)

## 2019-10-09 LAB — COMPREHENSIVE METABOLIC PANEL
ALT: 31 U/L (ref 0–44)
AST: 34 U/L (ref 15–41)
Albumin: 3.5 g/dL (ref 3.5–5.0)
Alkaline Phosphatase: 79 U/L (ref 38–126)
Anion gap: 9 (ref 5–15)
BUN: 10 mg/dL (ref 6–20)
CO2: 24 mmol/L (ref 22–32)
Calcium: 10.3 mg/dL (ref 8.9–10.3)
Chloride: 101 mmol/L (ref 98–111)
Creatinine, Ser: 0.98 mg/dL (ref 0.44–1.00)
GFR calc Af Amer: >60 mL/min (ref >60)
GFR calc non Af Amer: >60 mL/min (ref >60)
Glucose, Bld: 150 mg/dL — ABNORMAL HIGH (ref 70–99)
Potassium: 4.2 mmol/L (ref 3.5–5.1)
Sodium: 134 mmol/L — ABNORMAL LOW (ref 135–145)
Total Bilirubin: 1.2 mg/dL (ref 0.3–1.2)
Total Protein: 7.3 g/dL (ref 6.5–8.1)

## 2019-10-09 MED ORDER — FAMOTIDINE IN NACL 20-0.9 MG/50ML-% IV SOLN: 20.0000 mg | Freq: Once | INTRAVENOUS | Status: DC | PRN

## 2019-10-09 MED ORDER — METHYLPREDNISOLONE SODIUM SUCC 125 MG IJ SOLR: 125.0000 mg | Freq: Once | INTRAMUSCULAR | Status: DC | PRN

## 2019-10-09 MED ORDER — ALBUTEROL SULFATE HFA 108 (90 BASE) MCG/ACT IN AERS: 2.0000 | INHALATION_SPRAY | Freq: Once | RESPIRATORY_TRACT | Status: DC | PRN

## 2019-10-09 MED ORDER — SODIUM CHLORIDE 0.9 % IV SOLN: INTRAVENOUS | Status: DC | PRN

## 2019-10-09 MED ORDER — SODIUM CHLORIDE 0.9 % IV SOLN
700.0000 mg | Freq: Once | INTRAVENOUS | Status: AC
  Administered 2019-10-10: 700 mg via INTRAVENOUS
  Filled 2019-10-09: qty 20

## 2019-10-09 MED ORDER — EPINEPHRINE 0.3 MG/0.3ML IJ SOAJ: 0.3000 mg | Freq: Once | INTRAMUSCULAR | Status: DC | PRN

## 2019-10-09 MED ORDER — DIPHENHYDRAMINE HCL 50 MG/ML IJ SOLN: 50.0000 mg | Freq: Once | INTRAMUSCULAR | Status: DC | PRN
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
## 2019-10-09 NOTE — ED Triage Notes (Signed)
BIB EMS from home. Pt reports being dx with COVID last week, reporting inc SOB.

## 2019-10-10 ENCOUNTER — Telehealth: Payer: Self-pay

## 2019-10-10 DIAGNOSIS — Z713 Dietary counseling and surveillance: Secondary | ICD-10-CM | POA: Diagnosis not present

## 2019-10-10 DIAGNOSIS — F4323 Adjustment disorder with mixed anxiety and depressed mood: Secondary | ICD-10-CM | POA: Diagnosis not present

## 2019-10-10 LAB — POC SARS CORONAVIRUS 2 AG -  ED: SARS Coronavirus 2 Ag: POSITIVE — AB

## 2019-10-10 MED ORDER — ACETAMINOPHEN 500 MG PO TABS
1000.0000 mg | ORAL_TABLET | Freq: Once | ORAL | Status: AC
  Administered 2019-10-10: 1000 mg via ORAL
  Filled 2019-10-10: qty 2

## 2019-10-10 NOTE — Telephone Encounter (Signed)
New message    The patient voiced tested positive for COVID on Saturday  3.27.21.   Asking can Dr. Sharlet Salina take her out of work due to [place].   The patient was advised to check with her employment to see what the policy states and inquired how many days she can be out of work.    The patient is aware a message will be sent around to MD as well.

## 2019-10-10 NOTE — ED Notes (Signed)
[facility] POC covid test pos

## 2019-10-10 NOTE — ED Provider Notes (Signed)
1:55 AM Rapid COVID test is positive. Pharmacy notified and will start working on medications for monoclonal antibody infusion.  5:15 AM Monoclonal antibody infusion completed.  Patient has been observed for more than 1 hour after termination of infusion.  No clinical decompensation or hypoxia.  Tylenol has been ordered for residual temperature.  Counseled on home supportive care.  Also advised that patient continue her previously prescribed antibiotic.  Return precautions discussed and provided. Patient discharged in stable condition with no unaddressed concerns.  Emily Phelps was evaluated in Emergency Department on 10/10/2019 for the symptoms described in the history of present illness. She was evaluated in the context of the global COVID-19 pandemic, which necessitated consideration that the patient might be at risk for infection with the SARS-CoV-2 virus that causes COVID-19. Institutional protocols and algorithms that pertain to the evaluation of patients at risk for COVID-19 are in a state of rapid change based on information released by regulatory bodies including the CDC and federal and state organizations. These policies and algorithms were followed during the patient's care in the ED.   Vitals:   10/09/19 2356 10/10/19 0236 10/10/19 0258 10/10/19 0300  BP: (!) 110/56 (!) 131/91 134/86 134/86  Pulse: 97 99 100 100  Resp: (!) 25 (!) 25 (!) 24 (!) 24  Temp:  (!) 101.1 F (38.4 C) (!) 100.7 F (38.2 C)   TempSrc:  Oral Oral   SpO2: 96% 95% 96% 96%  Weight:      Height:          Antonietta Breach, PA-C 10/10/19 [id number]    Veryl Speak, MD 10/10/19 [id number]

## 2019-10-10 NOTE — Telephone Encounter (Signed)
Pt will need to make a ED follow-up w/PCP 21 days after testing positive. Pt has to be quarantine up to 21 days, and once she follow-up w/PCP she will determine if she need additional time off .Pls call pt to make ED f/u w/PCP at least 21 days after testing positive .Johny Chess

## 2019-10-10 NOTE — Discharge Instructions (Addendum)
Continue the antibiotic prescribed by your doctor as well as Tylenol for management of headache and fever.  You may use over-the-counter cough medicine as needed.  Drink plenty of fluids to prevent dehydration.  Return to the ED for new or concerning symptoms.

## 2019-10-11 NOTE — Telephone Encounter (Signed)
F/u    The patient  called and informed of previous messages from [place].   Appt is made on  4.23.21 @ 9:00am   The patient verbalized understanding.

## 2019-10-13 ENCOUNTER — Encounter: Payer: Self-pay | Admitting: Internal Medicine

## 2019-10-13 DIAGNOSIS — Z713 Dietary counseling and surveillance: Secondary | ICD-10-CM | POA: Diagnosis not present

## 2019-10-15 ENCOUNTER — Emergency Department (HOSPITAL_COMMUNITY): Payer: BC Managed Care – PPO

## 2019-10-15 ENCOUNTER — Inpatient Hospital Stay (HOSPITAL_COMMUNITY)
Admission: EM | Admit: 2019-10-15 | Discharge: 2019-10-18 | DRG: 177 | Disposition: A | Discharge status: Home / Self Care | Payer: BC Managed Care – PPO | Attending: Internal Medicine | Admitting: Internal Medicine

## 2019-10-15 ENCOUNTER — Other Ambulatory Visit: Payer: Self-pay

## 2019-10-15 DIAGNOSIS — Z6841 Body Mass Index (BMI) 40.0 and over, adult: Secondary | ICD-10-CM | POA: Diagnosis not present

## 2019-10-15 DIAGNOSIS — J9601 Acute respiratory failure with hypoxia: Secondary | ICD-10-CM | POA: Diagnosis not present

## 2019-10-15 DIAGNOSIS — K219 Gastro-esophageal reflux disease without esophagitis: Secondary | ICD-10-CM | POA: Diagnosis not present

## 2019-10-15 DIAGNOSIS — Z8249 Family history of ischemic heart disease and other diseases of the circulatory system: Secondary | ICD-10-CM

## 2019-10-15 DIAGNOSIS — E1169 Type 2 diabetes mellitus with other specified complication: Secondary | ICD-10-CM

## 2019-10-15 DIAGNOSIS — Z79899 Other long term (current) drug therapy: Secondary | ICD-10-CM | POA: Diagnosis not present

## 2019-10-15 DIAGNOSIS — E669 Obesity, unspecified: Secondary | ICD-10-CM | POA: Diagnosis present

## 2019-10-15 DIAGNOSIS — E119 Type 2 diabetes mellitus without complications: Secondary | ICD-10-CM | POA: Diagnosis not present

## 2019-10-15 DIAGNOSIS — J189 Pneumonia, unspecified organism: Secondary | ICD-10-CM | POA: Diagnosis not present

## 2019-10-15 DIAGNOSIS — J1282 Pneumonia due to coronavirus disease 2019: Secondary | ICD-10-CM | POA: Diagnosis present

## 2019-10-15 DIAGNOSIS — R0602 Shortness of breath: Secondary | ICD-10-CM | POA: Diagnosis not present

## 2019-10-15 DIAGNOSIS — U071 COVID-19: Secondary | ICD-10-CM | POA: Diagnosis not present

## 2019-10-15 DIAGNOSIS — I10 Essential (primary) hypertension: Secondary | ICD-10-CM | POA: Diagnosis present

## 2019-10-15 DIAGNOSIS — Z794 Long term (current) use of insulin: Secondary | ICD-10-CM

## 2019-10-15 DIAGNOSIS — J309 Allergic rhinitis, unspecified: Secondary | ICD-10-CM | POA: Diagnosis present

## 2019-10-15 DIAGNOSIS — Z833 Family history of diabetes mellitus: Secondary | ICD-10-CM | POA: Diagnosis not present

## 2019-10-15 DIAGNOSIS — E785 Hyperlipidemia, unspecified: Secondary | ICD-10-CM | POA: Diagnosis present

## 2019-10-15 DIAGNOSIS — R0600 Dyspnea, unspecified: Secondary | ICD-10-CM | POA: Diagnosis not present

## 2019-10-15 LAB — CBC WITH DIFFERENTIAL/PLATELET
Abs Immature Granulocytes: 0.23 10*3/uL — ABNORMAL HIGH (ref 0.00–0.07)
Basophils Absolute: 0.1 10*3/uL (ref 0.0–0.1)
Basophils Relative: 0 %
Eosinophils Absolute: 0.1 10*3/uL (ref 0.0–0.5)
Eosinophils Relative: 1 %
HCT: 36.5 % (ref 36.0–46.0)
Hemoglobin: 11.9 g/dL — ABNORMAL LOW (ref 12.0–15.0)
Immature Granulocytes: 2 %
Lymphocytes Relative: 21 %
Lymphs Abs: 2.6 10*3/uL (ref 0.7–4.0)
MCH: 25.5 pg — ABNORMAL LOW (ref 26.0–34.0)
MCHC: 32.6 g/dL (ref 30.0–36.0)
MCV: 78.3 fL — ABNORMAL LOW (ref 80.0–100.0)
Monocytes Absolute: 1.1 10*3/uL — ABNORMAL HIGH (ref 0.1–1.0)
Monocytes Relative: 9 %
Neutro Abs: 8.2 10*3/uL — ABNORMAL HIGH (ref 1.7–7.7)
Neutrophils Relative %: 67 %
Platelets: 459 10*3/uL — ABNORMAL HIGH (ref 150–400)
RBC: 4.66 MIL/uL (ref 3.87–5.11)
RDW: 17.2 % — ABNORMAL HIGH (ref 11.5–15.5)
WBC: 12.2 10*3/uL — ABNORMAL HIGH (ref 4.0–10.5)
nRBC: 0 % (ref 0.0–0.2)

## 2019-10-15 LAB — COMPREHENSIVE METABOLIC PANEL
ALT: 32 U/L (ref 0–44)
AST: 30 U/L (ref 15–41)
Albumin: 3.2 g/dL — ABNORMAL LOW (ref 3.5–5.0)
Alkaline Phosphatase: 70 U/L (ref 38–126)
Anion gap: 13 (ref 5–15)
BUN: 11 mg/dL (ref 6–20)
CO2: 25 mmol/L (ref 22–32)
Calcium: 10.8 mg/dL — ABNORMAL HIGH (ref 8.9–10.3)
Chloride: 97 mmol/L — ABNORMAL LOW (ref 98–111)
Creatinine, Ser: 0.99 mg/dL (ref 0.44–1.00)
GFR calc Af Amer: >60 mL/min (ref >60)
GFR calc non Af Amer: >60 mL/min (ref >60)
Glucose, Bld: 130 mg/dL — ABNORMAL HIGH (ref 70–99)
Potassium: 3.1 mmol/L — ABNORMAL LOW (ref 3.5–5.1)
Sodium: 135 mmol/L (ref 135–145)
Total Bilirubin: 0.5 mg/dL (ref 0.3–1.2)
Total Protein: 7.3 g/dL (ref 6.5–8.1)

## 2019-10-15 LAB — FIBRINOGEN: Fibrinogen: 648 mg/dL — ABNORMAL HIGH (ref 210–475)

## 2019-10-15 LAB — FERRITIN: Ferritin: 148 ng/mL (ref 11–307)

## 2019-10-15 LAB — C-REACTIVE PROTEIN: CRP: 10.9 mg/dL — ABNORMAL HIGH (ref <1.0)

## 2019-10-15 LAB — LACTIC ACID, PLASMA: Lactic Acid, Venous: 1.9 mmol/L (ref 0.5–1.9)

## 2019-10-15 LAB — PROCALCITONIN: Procalcitonin: <0.1 ng/mL

## 2019-10-15 LAB — GLUCOSE, CAPILLARY: Glucose-Capillary: 118 mg/dL — ABNORMAL HIGH (ref 70–99)

## 2019-10-15 LAB — D-DIMER, QUANTITATIVE: D-Dimer, Quant: 0.85 ug/mL-FEU — ABNORMAL HIGH (ref 0.00–0.50)

## 2019-10-15 LAB — TRIGLYCERIDES: Triglycerides: 298 mg/dL — ABNORMAL HIGH (ref <150)

## 2019-10-15 LAB — LACTATE DEHYDROGENASE: LDH: 258 U/L — ABNORMAL HIGH (ref 98–192)

## 2019-10-15 LAB — I-STAT BETA HCG BLOOD, ED (MC, WL, AP ONLY): I-stat hCG, quantitative: <5 m[IU]/mL (ref <5)

## 2019-10-15 MED ORDER — SODIUM CHLORIDE 0.9 % IV SOLN
200.0000 mg | Freq: Once | INTRAVENOUS | Status: AC
  Administered 2019-10-15: 200 mg via INTRAVENOUS
  Filled 2019-10-15: qty 40

## 2019-10-15 MED ORDER — ENOXAPARIN SODIUM 100 MG/ML ~~LOC~~ SOLN
0.5000 mg/kg | SUBCUTANEOUS | Status: DC
  Administered 2019-10-15 – 2019-10-17 (×3): 90 mg via SUBCUTANEOUS
  Filled 2019-10-15 (×2): qty 1
  Filled 2019-10-15: qty 0.9
  Filled 2019-10-15: qty 1

## 2019-10-15 MED ORDER — ENOXAPARIN SODIUM 40 MG/0.4ML ~~LOC~~ SOLN: 40.0000 mg | SUBCUTANEOUS | Status: DC

## 2019-10-15 MED ORDER — ONDANSETRON HCL 4 MG PO TABS: 4.0000 mg | ORAL_TABLET | Freq: Four times a day (QID) | ORAL | Status: DC | PRN

## 2019-10-15 MED ORDER — DEXAMETHASONE 6 MG PO TABS
6.0000 mg | ORAL_TABLET | ORAL | Status: DC
  Administered 2019-10-15 – 2019-10-17 (×3): 6 mg via ORAL
  Filled 2019-10-15 (×3): qty 1

## 2019-10-15 MED ORDER — GUAIFENESIN-DM 100-10 MG/5ML PO SYRP
10.0000 mL | ORAL_SOLUTION | ORAL | Status: DC | PRN
  Administered 2019-10-15 – 2019-10-18 (×8): 10 mL via ORAL
  Filled 2019-10-15 (×10): qty 10

## 2019-10-15 MED ORDER — INSULIN GLARGINE 100 UNIT/ML ~~LOC~~ SOLN
35.0000 [IU] | Freq: Two times a day (BID) | SUBCUTANEOUS | Status: DC
  Administered 2019-10-15 – 2019-10-18 (×6): 35 [IU] via SUBCUTANEOUS
  Filled 2019-10-15 (×7): qty 0.35

## 2019-10-15 MED ORDER — ATENOLOL 50 MG PO TABS
50.0000 mg | ORAL_TABLET | Freq: Every day | ORAL | Status: DC
  Administered 2019-10-16 – 2019-10-18 (×3): 50 mg via ORAL
  Filled 2019-10-15 (×4): qty 1

## 2019-10-15 MED ORDER — POTASSIUM CHLORIDE CRYS ER 20 MEQ PO TBCR
40.0000 meq | EXTENDED_RELEASE_TABLET | Freq: Once | ORAL | Status: AC
  Administered 2019-10-15: 40 meq via ORAL
  Filled 2019-10-15: qty 2

## 2019-10-15 MED ORDER — HYDROCOD POLST-CPM POLST ER 10-8 MG/5ML PO SUER
5.0000 mL | Freq: Two times a day (BID) | ORAL | Status: DC | PRN
  Administered 2019-10-17 – 2019-10-18 (×2): 5 mL via ORAL
  Filled 2019-10-15 (×2): qty 5

## 2019-10-15 MED ORDER — LOSARTAN POTASSIUM 50 MG PO TABS
100.0000 mg | ORAL_TABLET | Freq: Every day | ORAL | Status: DC
  Administered 2019-10-16 – 2019-10-18 (×3): 100 mg via ORAL
  Filled 2019-10-15 (×3): qty 2

## 2019-10-15 MED ORDER — ACETAMINOPHEN 325 MG PO TABS
650.0000 mg | ORAL_TABLET | Freq: Four times a day (QID) | ORAL | Status: DC | PRN
  Administered 2019-10-17 – 2019-10-18 (×2): 650 mg via ORAL
  Filled 2019-10-15 (×2): qty 2

## 2019-10-15 MED ORDER — PNEUMOCOCCAL VAC POLYVALENT 25 MCG/0.5ML IJ INJ
0.5000 mL | INJECTION | INTRAMUSCULAR | Status: DC
  Filled 2019-10-15: qty 0.5

## 2019-10-15 MED ORDER — SODIUM CHLORIDE 0.9 % IV SOLN
100.0000 mg | Freq: Every day | INTRAVENOUS | Status: DC
  Administered 2019-10-16 – 2019-10-18 (×3): 100 mg via INTRAVENOUS
  Filled 2019-10-15 (×3): qty 20

## 2019-10-15 MED ORDER — HYDROCHLOROTHIAZIDE 25 MG PO TABS
25.0000 mg | ORAL_TABLET | Freq: Every day | ORAL | Status: DC
  Administered 2019-10-16 – 2019-10-18 (×3): 25 mg via ORAL
  Filled 2019-10-15 (×3): qty 1

## 2019-10-15 MED ORDER — ADULT MULTIVITAMIN W/MINERALS CH
1.0000 | ORAL_TABLET | Freq: Every day | ORAL | Status: DC
  Administered 2019-10-16 – 2019-10-18 (×3): 1 via ORAL
  Filled 2019-10-15 (×3): qty 1

## 2019-10-15 MED ORDER — INSULIN ASPART 100 UNIT/ML ~~LOC~~ SOLN: 0.0000 [IU] | Freq: Every day | SUBCUTANEOUS | Status: DC

## 2019-10-15 MED ORDER — INSULIN ASPART 100 UNIT/ML ~~LOC~~ SOLN
0.0000 [IU] | Freq: Three times a day (TID) | SUBCUTANEOUS | Status: DC
  Administered 2019-10-16: 3 [IU] via SUBCUTANEOUS
  Administered 2019-10-16: 2 [IU] via SUBCUTANEOUS
  Administered 2019-10-16 – 2019-10-17 (×3): 3 [IU] via SUBCUTANEOUS
  Administered 2019-10-17: 8 [IU] via SUBCUTANEOUS
  Administered 2019-10-18: 3 [IU] via SUBCUTANEOUS
  Administered 2019-10-18: 2 [IU] via SUBCUTANEOUS

## 2019-10-15 MED ORDER — TOPIRAMATE 25 MG PO TABS
50.0000 mg | ORAL_TABLET | Freq: Two times a day (BID) | ORAL | Status: DC
  Administered 2019-10-15 – 2019-10-18 (×6): 50 mg via ORAL
  Filled 2019-10-15 (×6): qty 2

## 2019-10-15 MED ORDER — PANTOPRAZOLE SODIUM 40 MG PO TBEC
80.0000 mg | DELAYED_RELEASE_TABLET | Freq: Two times a day (BID) | ORAL | Status: DC
  Administered 2019-10-15 – 2019-10-18 (×6): 80 mg via ORAL
  Filled 2019-10-15 (×6): qty 2

## 2019-10-15 MED ORDER — ONDANSETRON HCL 4 MG/2ML IJ SOLN: 4.0000 mg | Freq: Four times a day (QID) | INTRAMUSCULAR | Status: DC | PRN
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
## 2019-10-15 NOTE — ED Triage Notes (Signed)
Pt here sent by [facility] for worsening covid symptoms and SOB. Was just seen ER the 28th for covid symptoms
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
## 2019-10-16 LAB — GLUCOSE, CAPILLARY
Glucose-Capillary: 199 mg/dL — ABNORMAL HIGH (ref 70–99)
Glucose-Capillary: 184 mg/dL — ABNORMAL HIGH (ref 70–99)
Glucose-Capillary: 144 mg/dL — ABNORMAL HIGH (ref 70–99)
Glucose-Capillary: 172 mg/dL — ABNORMAL HIGH (ref 70–99)

## 2019-10-16 LAB — CBC WITH DIFFERENTIAL/PLATELET
Abs Immature Granulocytes: 0.35 10*3/uL — ABNORMAL HIGH (ref 0.00–0.07)
Basophils Absolute: 0.1 10*3/uL (ref 0.0–0.1)
Basophils Relative: 0 %
Eosinophils Absolute: 0 10*3/uL (ref 0.0–0.5)
Eosinophils Relative: 0 %
HCT: 33.7 % — ABNORMAL LOW (ref 36.0–46.0)
Hemoglobin: 11 g/dL — ABNORMAL LOW (ref 12.0–15.0)
Immature Granulocytes: 2 %
Lymphocytes Relative: 17 %
Lymphs Abs: 2.5 10*3/uL (ref 0.7–4.0)
MCH: 25.3 pg — ABNORMAL LOW (ref 26.0–34.0)
MCHC: 32.6 g/dL (ref 30.0–36.0)
MCV: 77.6 fL — ABNORMAL LOW (ref 80.0–100.0)
Monocytes Absolute: 0.8 10*3/uL (ref 0.1–1.0)
Monocytes Relative: 6 %
Neutro Abs: 10.6 10*3/uL — ABNORMAL HIGH (ref 1.7–7.7)
Neutrophils Relative %: 75 %
Platelets: 519 10*3/uL — ABNORMAL HIGH (ref 150–400)
RBC: 4.34 MIL/uL (ref 3.87–5.11)
RDW: 17.1 % — ABNORMAL HIGH (ref 11.5–15.5)
WBC: 14.4 10*3/uL — ABNORMAL HIGH (ref 4.0–10.5)
nRBC: 0 % (ref 0.0–0.2)

## 2019-10-16 LAB — COMPREHENSIVE METABOLIC PANEL
ALT: 30 U/L (ref 0–44)
AST: 26 U/L (ref 15–41)
Albumin: 2.9 g/dL — ABNORMAL LOW (ref 3.5–5.0)
Alkaline Phosphatase: 64 U/L (ref 38–126)
Anion gap: 11 (ref 5–15)
BUN: 9 mg/dL (ref 6–20)
CO2: 24 mmol/L (ref 22–32)
Calcium: 10.5 mg/dL — ABNORMAL HIGH (ref 8.9–10.3)
Chloride: 101 mmol/L (ref 98–111)
Creatinine, Ser: 0.8 mg/dL (ref 0.44–1.00)
GFR calc Af Amer: >60 mL/min (ref >60)
GFR calc non Af Amer: >60 mL/min (ref >60)
Glucose, Bld: 217 mg/dL — ABNORMAL HIGH (ref 70–99)
Potassium: 4.1 mmol/L (ref 3.5–5.1)
Sodium: 136 mmol/L (ref 135–145)
Total Bilirubin: 0.5 mg/dL (ref 0.3–1.2)
Total Protein: 7 g/dL (ref 6.5–8.1)

## 2019-10-16 LAB — ABO/RH: ABO/RH(D): A POS

## 2019-10-16 LAB — MAGNESIUM: Magnesium: 1.7 mg/dL (ref 1.7–2.4)

## 2019-10-16 LAB — D-DIMER, QUANTITATIVE: D-Dimer, Quant: 0.56 ug/mL-FEU — ABNORMAL HIGH (ref 0.00–0.50)

## 2019-10-16 LAB — C-REACTIVE PROTEIN: CRP: 13 mg/dL — ABNORMAL HIGH (ref <1.0)

## 2019-10-16 LAB — BRAIN NATRIURETIC PEPTIDE: B Natriuretic Peptide: 6.8 pg/mL (ref 0.0–100.0)

## 2019-10-16 MED ORDER — ORAL CARE MOUTH RINSE
15.0000 mL | Freq: Two times a day (BID) | OROMUCOSAL | Status: DC
  Administered 2019-10-16 – 2019-10-18 (×5): 15 mL via OROMUCOSAL

## 2019-10-16 MED ORDER — SODIUM CHLORIDE 0.9 % IV SOLN: INTRAVENOUS | Status: DC | PRN

## 2019-10-16 NOTE — Progress Notes (Signed)
PROGRESS NOTE                                                                                                                                                                                                             Patient Demographics:    Emily Phelps, is a 39 y.o. female, DOB - [birth year], [id number]  Admit date - 10/15/2019   Admitting Physician Etta Quill, DO  Outpatient Primary MD for the patient is Hoyt Koch, MD  LOS - 1  Chief Complaint  Patient presents with  . Shortness of Breath       Brief Narrative -  Emily Phelps is a 39 y.o. female with medical history significant of DM2, HTN, morbid obesity.  Who was tested Covid positive on 10/06/2019 and started on course of oral doxycycline and steroids, came to the ER for some shortness of breath was diagnosed with COVID-19 pneumonia related acute hypoxic respiratory failure admitted to the hospital.     Subjective:    Emily Phelps today has, No headache, No chest pain, No abdominal pain - No Nausea, No new weakness tingling or numbness, mild cough and exertional shortness of breath.   Assessment  & Plan :     1.  Acute hypoxic respiratory failure due to COVID-19 pneumonia.  She has moderate disease, has been started on IV steroids and IV remdesivir, will continue to follow.  Encouraged to sit up in chair in the daytime use I-S and flutter valve for pulmonary toiletry, advance activity and titrate down oxygen the best we can.  COVID-19 Labs  Recent Labs    10/15/19 1955 10/16/19 0641  DDIMER 0.85* 0.56*  FERRITIN 148  --   LDH 258*  --   CRP 10.9* 13.0*     2.  Morbid obesity.  BMI of 66.  Follow with PCP for weight loss.  3.  Essential hypertension on nomination of beta-blocker, ARB continue.  4.  GERD.  On PPI.  5.  Type II.  Placed on Lantus along with sliding scale.  Will monitor and adjust.  Lab Results  Component Value Date   HGBA1C 5.6 05/20/2019    CBG (last 3)    Recent Labs    10/15/19 2258 10/16/19 0755  GLUCAP 118* 199*     Family Communication  :  Brother over the phone 10/16/19  Code Status :  Full  Disposition Plan  : Stay in the hospital and complete treatment for COVID-19 pneumonia with IV remdesivir  Consults  : None  Procedures  : None  DVT Prophylaxis  :  Lovenox   Lab Results  Component Value Date   PLT 519 (H) 10/16/2019    Diet :  Diet Order            Diet Carb Modified Fluid consistency: Thin; Room service appropriate? Yes  Diet effective now               Inpatient Medications Scheduled Meds: . atenolol  50 mg Oral Daily  . dexamethasone  6 mg Oral Q24H  . enoxaparin (LOVENOX) injection  0.5 mg/kg Subcutaneous Q24H  . hydrochlorothiazide  25 mg Oral Daily  . insulin aspart  0-15 Units Subcutaneous TID WC  . insulin aspart  0-5 Units Subcutaneous QHS  . insulin glargine  35 Units Subcutaneous BID  . losartan  100 mg Oral Daily  . mouth rinse  15 mL Mouth Rinse BID  . multivitamin with minerals  1 tablet Oral Daily  . pantoprazole  80 mg Oral BID  . pneumococcal 23 valent vaccine  0.5 mL Intramuscular Tomorrow-1000  . topiramate  50 mg Oral BID   Continuous Infusions: . sodium chloride    . remdesivir 100 mg in NS 100 mL 100 mg (10/16/19 0817)   PRN Meds:.sodium chloride, acetaminophen, chlorpheniramine-HYDROcodone, guaiFENesin-dextromethorphan, ondansetron **OR** ondansetron (ZOFRAN) IV  Antibiotics  :   Anti-infectives (From admission, onward)   Start     Dose/Rate Route Frequency Ordered Stop   10/16/19 1000  remdesivir 100 mg in sodium chloride 0.9 % 100 mL IVPB     100 mg 200 mL/hr over 30 Minutes Intravenous Daily 10/15/19 2050 10/20/19 0959   10/15/19 2200  remdesivir 200 mg in sodium chloride 0.9% 250 mL IVPB     200 mg 580 mL/hr over 30 Minutes Intravenous Once 10/15/19 2050 10/15/19 2301          Objective:   Vitals:   10/16/19 0600 10/16/19 0615 10/16/19 0733 10/16/19 0800   BP:    115/89  Pulse: 84 79 85 98  Resp: (!) 30 18  19   Temp: 98.3 F (36.8 C)   98.1 F (36.7 C)  TempSrc: Oral   Oral  SpO2: 91% 95% 92% 91%  Weight:      Height:        SpO2: 91 % O2 Flow Rate (L/min): 1 L/min  Wt Readings from Last 3 Encounters:  10/15/19 (!) 175.6 kg  10/09/19 (!) 179.2 kg  10/07/19 (!) 179.2 kg     Intake/Output Summary (Last 24 hours) at 10/16/2019 1043 Last data filed at 10/16/2019 0948 Gross per 24 hour  Intake 360 ml  Output 1250 ml  Net -890 ml     Physical Exam  Awake Alert, No new F.N deficits, Normal affect [place].AT,PERRAL Supple Neck,No JVD, No cervical lymphadenopathy appriciated.  Symmetrical Chest wall movement, Good air movement bilaterally, CTAB RRR,No Gallops,Rubs or new Murmurs, No Parasternal Heave +ve B.Sounds, Abd Soft, No tenderness, No organomegaly appriciated, No rebound - guarding or rigidity. No Cyanosis, Clubbing or edema, No new Rash or bruise       Data Review:    Recent Labs  Lab 10/09/19 2147 10/15/19 1955 10/16/19 0641  WBC 12.3* 12.2* 14.4*  HGB 12.3 11.9* 11.0*  HCT 37.2 36.5 33.7*  PLT 376 459* 519*  MCV 76.5* 78.3* 77.6*  MCH 25.3* 25.5* 25.3*  MCHC 33.1 32.6 32.6  RDW 17.0* 17.2* 17.1*  LYMPHSABS  --  2.6 2.5  MONOABS  --  1.1* 0.8  EOSABS  --  0.1 0.0  BASOSABS  --  0.1 0.1    Recent Labs  Lab 10/09/19 2147 10/15/19 1955 10/16/19 0641  NA 134* 135 136  K 4.2 3.1* 4.1  CL 101 97* 101  CO2 24 25 24   GLUCOSE 150* 130* 217*  BUN 10 11 9   CREATININE 0.98 0.99 0.80  CALCIUM 10.3 10.8* 10.5*  AST 34 30 26  ALT 31 32 30  ALKPHOS 79 70 64  BILITOT 1.2 0.5 0.5  ALBUMIN 3.5 3.2* 2.9*  MG  --   --  1.7  CRP  --  10.9* 13.0*  DDIMER  --  0.85* 0.56*  PROCALCITON  --  <0.10  --   BNP  --   --  6.8    Recent Labs  Lab 10/15/19 1955 10/16/19 0641  CRP 10.9* 13.0*  DDIMER 0.85* 0.56*  BNP  --  6.8  PROCALCITON <0.10  --      ------------------------------------------------------------------------------------------------------------------ Recent Labs    10/15/19 1956  TRIG 298*    Lab Results  Component Value Date   HGBA1C 5.6 05/20/2019   ------------------------------------------------------------------------------------------------------------------ No results for input(s): TSH, T4TOTAL, T3FREE, THYROIDAB in the last 72 hours.  Invalid input(s): FREET3 ------------------------------------------------------------------------------------------------------------------ Recent Labs    10/15/19 1955  FERRITIN 148    Coagulation profile No results for input(s): INR, PROTIME in the last 168 hours.  Recent Labs    10/15/19 1955 10/16/19 0641  DDIMER 0.85* 0.56*    Cardiac Enzymes No results for input(s): CKMB, TROPONINI, MYOGLOBIN in the last 168 hours.  Invalid input(s): CK ------------------------------------------------------------------------------------------------------------------    Component Value Date/Time   BNP 6.8 10/16/2019 0641    Micro Results Recent Results (from the past 240 hour(s))  Blood Culture (routine x 2)     Status: None (Preliminary result)   Collection Time: 10/15/19  8:40 PM   Specimen: BLOOD RIGHT FOREARM  Result Value Ref Range Status   Specimen Description BLOOD RIGHT FOREARM  Final   Special Requests   Final    BOTTLES DRAWN AEROBIC AND ANAEROBIC Blood Culture results may not be optimal due to an excessive volume of blood received in culture bottles   Culture   Final    NO GROWTH < 12 HOURS Performed at [facility], [place]. [address] [place], [place] [address]    Report Status PENDING  Incomplete  Blood Culture (routine x 2)     Status: None (Preliminary result)   Collection Time: 10/15/19  8:45 PM   Specimen: BLOOD  Result Value Ref Range Status   Specimen Description BLOOD RIGHT ANTECUBITAL  Final   Special Requests   Final    BOTTLES DRAWN  AEROBIC AND ANAEROBIC Blood Culture adequate volume   Culture   Final    NO GROWTH < 12 HOURS Performed at [facility], [place] [address], [place], [place] [address]    Report Status PENDING  Incomplete    Radiology Reports DG Chest Port 1 View  Result Date: 10/15/2019 CLINICAL DATA:  Worsening COVID symptoms, shortness of breath EXAM: PORTABLE CHEST 1 VIEW COMPARISON:  10/09/2019 FINDINGS: Mild cardiomegaly. Significant interval increase in heterogeneous bilateral airspace opacity, most conspicuous in the right upper lobe. The visualized skeletal structures are unremarkable. IMPRESSION: Significant interval increase in heterogeneous bilateral airspace opacity, most conspicuous in the right upper lobe, in keeping with COVID pneumonia. Electronically Signed   By: Dorna Bloom.D.  On: 10/15/2019 20:05   DG Chest Portable 1 View  Result Date: 10/09/2019 CLINICAL DATA:  Shortness of breath, COVID EXAM: PORTABLE CHEST 1 VIEW COMPARISON:  10/07/2019 FINDINGS: Mild patchy right upper lobe opacity. Left lung is clear. No pleural effusion or pneumothorax. Heart is normal in size. IMPRESSION: Mild right upper lobe opacity, likely reflecting pneumonia in this patient with known COVID. Electronically Signed   By: Julian Hy M.D.   On: 10/09/2019 21:54   DG Chest Port 1 View  Result Date: 10/07/2019 CLINICAL DATA:  Cough and fever. EXAM: PORTABLE CHEST 1 VIEW COMPARISON:  March 11, 2019 FINDINGS: The heart size and mediastinal contours are within normal limits. Both lungs are clear. The visualized skeletal structures are unremarkable. IMPRESSION: No active disease. Electronically Signed   By: Abelardo Diesel M.D.   On: 10/07/2019 18:46    Time Spent in minutes  30   Lala Lund M.D on 10/16/2019 at 10:43 AM  To page go to [email or web address] - password [facility]

## 2019-10-17 ENCOUNTER — Encounter (HOSPITAL_COMMUNITY): Payer: Self-pay | Admitting: Internal Medicine

## 2019-10-17 DIAGNOSIS — J9601 Acute respiratory failure with hypoxia: Secondary | ICD-10-CM

## 2019-10-17 DIAGNOSIS — U071 COVID-19: Principal | ICD-10-CM

## 2019-10-17 LAB — COMPREHENSIVE METABOLIC PANEL
ALT: 29 U/L (ref 0–44)
AST: 31 U/L (ref 15–41)
Albumin: 2.9 g/dL — ABNORMAL LOW (ref 3.5–5.0)
Alkaline Phosphatase: 63 U/L (ref 38–126)
Anion gap: 13 (ref 5–15)
BUN: 13 mg/dL (ref 6–20)
CO2: 21 mmol/L — ABNORMAL LOW (ref 22–32)
Calcium: 10.6 mg/dL — ABNORMAL HIGH (ref 8.9–10.3)
Chloride: 100 mmol/L (ref 98–111)
Creatinine, Ser: 0.9 mg/dL (ref 0.44–1.00)
GFR calc Af Amer: >60 mL/min (ref >60)
GFR calc non Af Amer: >60 mL/min (ref >60)
Glucose, Bld: 222 mg/dL — ABNORMAL HIGH (ref 70–99)
Potassium: 3.8 mmol/L (ref 3.5–5.1)
Sodium: 134 mmol/L — ABNORMAL LOW (ref 135–145)
Total Bilirubin: 0.5 mg/dL (ref 0.3–1.2)
Total Protein: 7.1 g/dL (ref 6.5–8.1)

## 2019-10-17 LAB — CBC WITH DIFFERENTIAL/PLATELET
Abs Immature Granulocytes: 0.35 10*3/uL — ABNORMAL HIGH (ref 0.00–0.07)
Basophils Absolute: 0.1 10*3/uL (ref 0.0–0.1)
Basophils Relative: 0 %
Eosinophils Absolute: 0.2 10*3/uL (ref 0.0–0.5)
Eosinophils Relative: 1 %
HCT: 32.9 % — ABNORMAL LOW (ref 36.0–46.0)
Hemoglobin: 11 g/dL — ABNORMAL LOW (ref 12.0–15.0)
Immature Granulocytes: 2 %
Lymphocytes Relative: 19 %
Lymphs Abs: 2.8 10*3/uL (ref 0.7–4.0)
MCH: 25.6 pg — ABNORMAL LOW (ref 26.0–34.0)
MCHC: 33.4 g/dL (ref 30.0–36.0)
MCV: 76.7 fL — ABNORMAL LOW (ref 80.0–100.0)
Monocytes Absolute: 0.8 10*3/uL (ref 0.1–1.0)
Monocytes Relative: 5 %
Neutro Abs: 10.4 10*3/uL — ABNORMAL HIGH (ref 1.7–7.7)
Neutrophils Relative %: 73 %
Platelets: 570 10*3/uL — ABNORMAL HIGH (ref 150–400)
RBC: 4.29 MIL/uL (ref 3.87–5.11)
RDW: 17.1 % — ABNORMAL HIGH (ref 11.5–15.5)
WBC: 14.6 10*3/uL — ABNORMAL HIGH (ref 4.0–10.5)
nRBC: 0 % (ref 0.0–0.2)

## 2019-10-17 LAB — HEMOGLOBIN A1C
Hgb A1c MFr Bld: 6.3 % — ABNORMAL HIGH (ref 4.8–5.6)
Mean Plasma Glucose: 134.11 mg/dL

## 2019-10-17 LAB — GLUCOSE, CAPILLARY
Glucose-Capillary: 253 mg/dL — ABNORMAL HIGH (ref 70–99)
Glucose-Capillary: 197 mg/dL — ABNORMAL HIGH (ref 70–99)
Glucose-Capillary: 169 mg/dL — ABNORMAL HIGH (ref 70–99)
Glucose-Capillary: 141 mg/dL — ABNORMAL HIGH (ref 70–99)

## 2019-10-17 LAB — MAGNESIUM: Magnesium: 1.6 mg/dL — ABNORMAL LOW (ref 1.7–2.4)

## 2019-10-17 LAB — C-REACTIVE PROTEIN: CRP: 9.7 mg/dL — ABNORMAL HIGH (ref <1.0)

## 2019-10-17 LAB — D-DIMER, QUANTITATIVE: D-Dimer, Quant: 0.72 ug/mL-FEU — ABNORMAL HIGH (ref 0.00–0.50)

## 2019-10-17 LAB — BRAIN NATRIURETIC PEPTIDE: B Natriuretic Peptide: 7.2 pg/mL (ref 0.0–100.0)

## 2019-10-17 MED ORDER — MAGNESIUM SULFATE 2 GM/50ML IV SOLN
2.0000 g | Freq: Once | INTRAVENOUS | Status: AC
  Administered 2019-10-17: 2 g via INTRAVENOUS
  Filled 2019-10-17: qty 50

## 2019-10-17 NOTE — Progress Notes (Signed)
PROGRESS NOTE                                                                                                                                                                                                             Patient Demographics:    Emily Phelps, is a 39 y.o. female, DOB - 29-Apr-1981, [id number]  Admit date - 10/15/2019   Admitting Physician Etta Quill, DO  Outpatient Primary MD for the patient is Hoyt Koch, MD  LOS - 2  Chief Complaint  Patient presents with  . Shortness of Breath       Brief Narrative -  Emily Phelps is a 39 y.o. female with medical history significant of DM2, HTN, morbid obesity.  Who was tested Covid positive on 10/06/2019 and started on course of oral doxycycline and steroids, came to the ER for some shortness of breath was diagnosed with COVID-19 pneumonia related acute hypoxic respiratory failure admitted to the hospital.     Subjective:   Patient in bed, appears comfortable, denies any headache, no fever, no chest pain or pressure, no shortness of breath , no abdominal pain. No focal weakness.    Assessment  & Plan :     1.  Acute hypoxic respiratory failure due to COVID-19 pneumonia.  She has moderate disease, has been started on IV steroids and IV remdesivir, will continue to follow.  Encouraged to sit up in chair in the daytime use I-S and flutter valve for pulmonary toiletry, advance activity and titrate down oxygen the best we can.  COVID-19 Labs  Recent Labs    10/15/19 1955 10/16/19 0641 10/17/19 0316  DDIMER 0.85* 0.56* 0.72*  FERRITIN 148  --   --   LDH 258*  --   --   CRP 10.9* 13.0* 9.7*     2.  Morbid obesity.  BMI of 66.  Follow with PCP for weight loss.  3.  Essential hypertension on nomination of beta-blocker, ARB continue.  4.  GERD.  On PPI.  5.  Type II.  Placed on Lantus along with sliding scale.  Will monitor and adjust.  Lab Results  Component Value Date   HGBA1C 6.3 (H)  10/17/2019    CBG (last 3)  Recent Labs    10/16/19 1700 10/16/19 2226 10/17/19 0929  GLUCAP 144* 172* 253*     Family Communication  :  Brother over the phone 10/16/19  Code Status :  Full  Disposition Plan  : Stay in  the hospital and complete treatment for COVID-19 pneumonia with IV remdesivir  Consults  : None  Procedures  : None  DVT Prophylaxis  :  Lovenox   Lab Results  Component Value Date   PLT 570 (H) 10/17/2019    Diet :  Diet Order            Diet Carb Modified Fluid consistency: Thin; Room service appropriate? Yes  Diet effective now               Inpatient Medications Scheduled Meds: . atenolol  50 mg Oral Daily  . dexamethasone  6 mg Oral Q24H  . enoxaparin (LOVENOX) injection  0.5 mg/kg Subcutaneous Q24H  . hydrochlorothiazide  25 mg Oral Daily  . insulin aspart  0-15 Units Subcutaneous TID WC  . insulin aspart  0-5 Units Subcutaneous QHS  . insulin glargine  35 Units Subcutaneous BID  . losartan  100 mg Oral Daily  . mouth rinse  15 mL Mouth Rinse BID  . multivitamin with minerals  1 tablet Oral Daily  . pantoprazole  80 mg Oral BID  . pneumococcal 23 valent vaccine  0.5 mL Intramuscular Tomorrow-1000  . topiramate  50 mg Oral BID   Continuous Infusions: . sodium chloride    . magnesium sulfate bolus IVPB    . remdesivir 100 mg in NS 100 mL 100 mg (10/17/19 1051)   PRN Meds:.sodium chloride, acetaminophen, chlorpheniramine-HYDROcodone, guaiFENesin-dextromethorphan, ondansetron **OR** ondansetron (ZOFRAN) IV  Antibiotics  :   Anti-infectives (From admission, onward)   Start     Dose/Rate Route Frequency Ordered Stop   10/16/19 1000  remdesivir 100 mg in sodium chloride 0.9 % 100 mL IVPB     100 mg 200 mL/hr over 30 Minutes Intravenous Daily 10/15/19 2050 10/20/19 0959   10/15/19 2200  remdesivir 200 mg in sodium chloride 0.9% 250 mL IVPB     200 mg 580 mL/hr over 30 Minutes Intravenous Once 10/15/19 2050 10/15/19 2301           Objective:   Vitals:   10/17/19 0400 10/17/19 0537 10/17/19 0657 10/17/19 0927  BP:  140/83  (!) 155/93  Pulse: 82 79  92  Resp:  17  18  Temp:  98.8 F (37.1 C) 98 F (36.7 C) 98 F (36.7 C)  TempSrc:  Oral Oral Oral  SpO2: 90% 92%  94%  Weight:      Height:        SpO2: 94 % O2 Flow Rate (L/min): 1 L/min  Wt Readings from Last 3 Encounters:  10/15/19 (!) 175.6 kg  10/09/19 (!) 179.2 kg  10/07/19 (!) 179.2 kg     Intake/Output Summary (Last 24 hours) at 10/17/2019 1120 Last data filed at 10/17/2019 1051 Gross per 24 hour  Intake 1520 ml  Output 1300 ml  Net 220 ml     Physical Exam  Awake Alert, No new F.N deficits, Normal affect [place].AT,PERRAL Supple Neck,No JVD, No cervical lymphadenopathy appriciated.  Symmetrical Chest wall movement, Good air movement bilaterally, CTAB RRR,No Gallops, Rubs or new Murmurs, No Parasternal Heave +ve B.Sounds, Abd Soft, No tenderness, No organomegaly appriciated, No rebound - guarding or rigidity. No Cyanosis, Clubbing or edema, No new Rash or bruise     Data Review:    Recent Labs  Lab 10/15/19 1955 10/16/19 0641 10/17/19 0316  WBC 12.2* 14.4* 14.6*  HGB 11.9* 11.0* 11.0*  HCT 36.5 33.7* 32.9*  PLT 459* 519* 570*  MCV 78.3* 77.6* 76.7*  MCH 25.5* 25.3* 25.6*  MCHC 32.6 32.6 33.4  RDW 17.2* 17.1* 17.1*  LYMPHSABS 2.6 2.5 2.8  MONOABS 1.1* 0.8 0.8  EOSABS 0.1 0.0 0.2  BASOSABS 0.1 0.1 0.1    Recent Labs  Lab 10/15/19 1955 10/16/19 0641 10/17/19 0316  NA 135 136 134*  K 3.1* 4.1 3.8  CL 97* 101 100  CO2 25 24 21*  GLUCOSE 130* 217* 222*  BUN 11 9 13   CREATININE 0.99 0.80 0.90  CALCIUM 10.8* 10.5* 10.6*  AST 30 26 31   ALT 32 30 29  ALKPHOS 70 64 63  BILITOT 0.5 0.5 0.5  ALBUMIN 3.2* 2.9* 2.9*  MG  --  1.7 1.6*  CRP 10.9* 13.0* 9.7*  DDIMER 0.85* 0.56* 0.72*  PROCALCITON <0.10  --   --   HGBA1C  --   --  6.3*  BNP  --  6.8 7.2    Recent Labs  Lab 10/15/19 1955 10/16/19 0641 10/17/19 0316  CRP  10.9* 13.0* 9.7*  DDIMER 0.85* 0.56* 0.72*  BNP  --  6.8 7.2  PROCALCITON <0.10  --   --     ------------------------------------------------------------------------------------------------------------------ Recent Labs    10/15/19 1956  TRIG 298*    Lab Results  Component Value Date   HGBA1C 6.3 (H) 10/17/2019   ------------------------------------------------------------------------------------------------------------------ No results for input(s): TSH, T4TOTAL, T3FREE, THYROIDAB in the last 72 hours.  Invalid input(s): FREET3 ------------------------------------------------------------------------------------------------------------------ Recent Labs    10/15/19 1955  FERRITIN 148    Coagulation profile No results for input(s): INR, PROTIME in the last 168 hours.  Recent Labs    10/16/19 0641 10/17/19 0316  DDIMER 0.56* 0.72*    Cardiac Enzymes No results for input(s): CKMB, TROPONINI, MYOGLOBIN in the last 168 hours.  Invalid input(s): CK ------------------------------------------------------------------------------------------------------------------    Component Value Date/Time   BNP 7.2 10/17/2019 0316    Micro Results Recent Results (from the past 240 hour(s))  Blood Culture (routine x 2)     Status: None (Preliminary result)   Collection Time: 10/15/19  8:40 PM   Specimen: BLOOD RIGHT FOREARM  Result Value Ref Range Status   Specimen Description BLOOD RIGHT FOREARM  Final   Special Requests   Final    BOTTLES DRAWN AEROBIC AND ANAEROBIC Blood Culture results may not be optimal due to an excessive volume of blood received in culture bottles   Culture   Final    NO GROWTH < 12 HOURS Performed at [facility], [place]. [address], [place], [place] [address]    Report Status PENDING  Incomplete  Blood Culture (routine x 2)     Status: None (Preliminary result)   Collection Time: 10/15/19  8:45 PM   Specimen: BLOOD  Result Value Ref Range Status    Specimen Description BLOOD RIGHT ANTECUBITAL  Final   Special Requests   Final    BOTTLES DRAWN AEROBIC AND ANAEROBIC Blood Culture adequate volume   Culture   Final    NO GROWTH < 12 HOURS Performed at [facility], [place] [address], [place], [place] [address]    Report Status PENDING  Incomplete    Radiology Reports DG Chest Port 1 View  Result Date: 10/15/2019 CLINICAL DATA:  Worsening COVID symptoms, shortness of breath EXAM: PORTABLE CHEST 1 VIEW COMPARISON:  10/09/2019 FINDINGS: Mild cardiomegaly. Significant interval increase in heterogeneous bilateral airspace opacity, most conspicuous in the right upper lobe. The visualized skeletal structures are unremarkable. IMPRESSION: Significant interval increase in heterogeneous bilateral airspace  opacity, most conspicuous in the right upper lobe, in keeping with COVID pneumonia. Electronically Signed   By: Eddie Candle M.D.   On: 10/15/2019 20:05   DG Chest Portable 1 View  Result Date: 10/09/2019 CLINICAL DATA:  Shortness of breath, COVID EXAM: PORTABLE CHEST 1 VIEW COMPARISON:  10/07/2019 FINDINGS: Mild patchy right upper lobe opacity. Left lung is clear. No pleural effusion or pneumothorax. Heart is normal in size. IMPRESSION: Mild right upper lobe opacity, likely reflecting pneumonia in this patient with known COVID. Electronically Signed   By: Julian Hy M.D.   On: 10/09/2019 21:54   DG Chest Port 1 View  Result Date: 10/07/2019 CLINICAL DATA:  Cough and fever. EXAM: PORTABLE CHEST 1 VIEW COMPARISON:  March 11, 2019 FINDINGS: The heart size and mediastinal contours are within normal limits. Both lungs are clear. The visualized skeletal structures are unremarkable. IMPRESSION: No active disease. Electronically Signed   By: Abelardo Diesel M.D.   On: 10/07/2019 18:46    Time Spent in minutes  30   Lala Lund M.D on 10/17/2019 at 11:20 AM  To page go to [email or web address] - password [facility]

## 2019-10-17 NOTE — Plan of Care (Signed)

## 2019-10-17 NOTE — Plan of Care (Signed)
  Problem: Clinical Measurements: Goal: Respiratory complications will improve Outcome: Progressing   

## 2019-10-18 LAB — CBC WITH DIFFERENTIAL/PLATELET
Abs Immature Granulocytes: 0.37 10*3/uL — ABNORMAL HIGH (ref 0.00–0.07)
Basophils Absolute: 0 10*3/uL (ref 0.0–0.1)
Basophils Relative: 0 %
Eosinophils Absolute: 0.2 10*3/uL (ref 0.0–0.5)
Eosinophils Relative: 1 %
HCT: 33.8 % — ABNORMAL LOW (ref 36.0–46.0)
Hemoglobin: 11.3 g/dL — ABNORMAL LOW (ref 12.0–15.0)
Immature Granulocytes: 3 %
Lymphocytes Relative: 19 %
Lymphs Abs: 2.5 10*3/uL (ref 0.7–4.0)
MCH: 26.1 pg (ref 26.0–34.0)
MCHC: 33.4 g/dL (ref 30.0–36.0)
MCV: 78.1 fL — ABNORMAL LOW (ref 80.0–100.0)
Monocytes Absolute: 0.7 10*3/uL (ref 0.1–1.0)
Monocytes Relative: 5 %
Neutro Abs: 9.5 10*3/uL — ABNORMAL HIGH (ref 1.7–7.7)
Neutrophils Relative %: 72 %
Platelets: 603 10*3/uL — ABNORMAL HIGH (ref 150–400)
RBC: 4.33 MIL/uL (ref 3.87–5.11)
RDW: 17.4 % — ABNORMAL HIGH (ref 11.5–15.5)
WBC: 13.2 10*3/uL — ABNORMAL HIGH (ref 4.0–10.5)
nRBC: 0 % (ref 0.0–0.2)

## 2019-10-18 LAB — COMPREHENSIVE METABOLIC PANEL
ALT: 28 U/L (ref 0–44)
AST: 29 U/L (ref 15–41)
Albumin: 3.1 g/dL — ABNORMAL LOW (ref 3.5–5.0)
Alkaline Phosphatase: 61 U/L (ref 38–126)
Anion gap: 15 (ref 5–15)
BUN: 13 mg/dL (ref 6–20)
CO2: 20 mmol/L — ABNORMAL LOW (ref 22–32)
Calcium: 10.6 mg/dL — ABNORMAL HIGH (ref 8.9–10.3)
Chloride: 101 mmol/L (ref 98–111)
Creatinine, Ser: 0.84 mg/dL (ref 0.44–1.00)
GFR calc Af Amer: >60 mL/min (ref >60)
GFR calc non Af Amer: >60 mL/min (ref >60)
Glucose, Bld: 207 mg/dL — ABNORMAL HIGH (ref 70–99)
Potassium: 4.2 mmol/L (ref 3.5–5.1)
Sodium: 136 mmol/L (ref 135–145)
Total Bilirubin: 0.4 mg/dL (ref 0.3–1.2)
Total Protein: 6.9 g/dL (ref 6.5–8.1)

## 2019-10-18 LAB — GLUCOSE, CAPILLARY
Glucose-Capillary: 159 mg/dL — ABNORMAL HIGH (ref 70–99)
Glucose-Capillary: 137 mg/dL — ABNORMAL HIGH (ref 70–99)

## 2019-10-18 LAB — C-REACTIVE PROTEIN: CRP: 4.9 mg/dL — ABNORMAL HIGH (ref <1.0)

## 2019-10-18 LAB — BRAIN NATRIURETIC PEPTIDE: B Natriuretic Peptide: 13.5 pg/mL (ref 0.0–100.0)

## 2019-10-18 LAB — D-DIMER, QUANTITATIVE: D-Dimer, Quant: 0.39 ug/mL-FEU (ref 0.00–0.50)

## 2019-10-18 LAB — MAGNESIUM: Magnesium: 1.8 mg/dL (ref 1.7–2.4)

## 2019-10-18 MED ORDER — GUAIFENESIN-DM 100-10 MG/5ML PO SYRP: 5.0000 mL | ORAL_SOLUTION | Freq: Three times a day (TID) | ORAL | 0 refills | Status: DC | PRN

## 2019-10-18 MED ORDER — ALBUTEROL SULFATE HFA 108 (90 BASE) MCG/ACT IN AERS: 2.0000 | INHALATION_SPRAY | Freq: Four times a day (QID) | RESPIRATORY_TRACT | 0 refills | Status: AC | PRN

## 2019-10-18 NOTE — Care Management (Signed)
Pt deemed stable for discharge home today.  CM reviewed chart for TOC needs/orders/consults - none found.  CM signing off

## 2019-10-18 NOTE — Discharge Summary (Signed)
Emily Phelps TCY:[id number] DOB: April 22, 1981 DOA: 10/15/2019  PCP: Hoyt Koch, MD  Admit date: 10/15/2019  Discharge date: 10/18/2019  Admitted From: Home   Disposition:  Home   Recommendations for Outpatient Follow-up:   Follow up with PCP in 1-2 weeks  PCP Please obtain BMP/CBC, 2 view CXR in 1week,  (see Discharge instructions)   PCP Please follow up on the following pending results: Monitor CBGs, weight.   Home Health: None Equipment/Devices: None Consultations: None  Discharge Condition: Stable    CODE STATUS: Full    Diet Recommendation: Heart Healthy low carbohydrate   Chief Complaint  Patient presents with  . Shortness of Breath     Brief history of present illness from the day of admission and additional interim summary    Emily Phelps a 39 y.o.femalewith medical history significant ofDM2, HTN, morbid obesity.  Who was tested Covid positive on 10/06/2019 and started on course of oral doxycycline and steroids, came to the ER for some shortness of breath was diagnosed with COVID-19 pneumonia related acute hypoxic respiratory failure admitted to the hospital.                                                                 Hospital Course   1.  Acute COVID-19 pneumonia.  She had moderate disease at best no oxygen requirement after 24 hours at all, currently symptom-free on room air for the last 2 days, she has been treated with steroids and remdesivir, she has no signs of worsening disease at all and will be discharged home to finish her remdesivir course with last dose tomorrow to be given in the outpatient clinic.  Encouraged to sit up in chair in the daytime use I-S and flutter valve for pulmonary toiletry, advance activity and titrate down oxygen the best we can.   Recent Labs  Lab  10/15/19 1955 10/16/19 0641 10/17/19 0316 10/18/19 0205  CRP 10.9* 13.0* 9.7* 4.9*  DDIMER 0.85* 0.56* 0.72* 0.39  FERRITIN 148  --   --   --   BNP  --  6.8 7.2 13.5  PROCALCITON <0.10  --   --   --     Hepatic Function Latest Ref Rng & Units 10/18/2019 10/17/2019 10/16/2019  Total Protein 6.5 - 8.1 g/dL 6.9 7.1 7.0  Albumin 3.5 - 5.0 g/dL 3.1(L) 2.9(L) 2.9(L)  AST 15 - 41 U/L '29 31 26  ' ALT 0 - 44 U/L '28 29 30  ' Alk Phosphatase 38 - 126 U/L 61 63 64  Total Bilirubin 0.3 - 1.2 mg/dL 0.4 0.5 0.5  Bilirubin, Direct 0.0 - 0.3 mg/dL - - -    2.  Morbid obesity.  BMI of 66.  Follow with PCP for weight loss.  3.  Essential hypertension - continue home combination of beta-blocker,  ARB continue.  4.  GERD.  On PPI.  5.  Type II.    Continue home regimen follow with PCP for glycemic control.  Lab Results  Component Value Date   HGBA1C 6.3 (H) 10/17/2019     Discharge diagnosis     Principal Problem:   Acute hypoxemic respiratory failure due to COVID-19 [facility]) Active Problems:   Essential hypertension   Obesity, Class III, BMI 40-49.9 (morbid obesity) ([place])   Dyspnea   Controlled type 2 diabetes mellitus without complication, with long-term current use of insulin [facility])    Discharge instructions    Discharge Instructions    Discharge instructions   Complete by: As directed    Follow with Primary MD Hoyt Koch, MD in 7 days   Get CBC, CMP, 2 view Chest X ray -  checked next visit within 1 week by Primary MD   Activity: As tolerated with Full fall precautions use walker/cane & assistance as needed  Disposition Home    Diet: Heart Healthy Low Carb  Accuchecks 4 times/day, Once in AM empty stomach and then before each meal. Log in all results and show them to your Prim.MD in 3 days. If any glucose reading is under 80 or above 300 call your Prim MD immidiately. Follow Low glucose instructions for glucose under 80 as instructed.   Special Instructions: If  you have smoked or chewed Tobacco  in the last 2 yrs please stop smoking, stop any regular Alcohol  and or any Recreational drug use.  On your next visit with your primary care physician please Get Medicines reviewed and adjusted.  Please request your Prim.MD to go over all Hospital Tests and Procedure/Radiological results at the follow up, please get all Hospital records sent to your Prim MD by signing hospital release before you go home.  If you experience worsening of your admission symptoms, develop shortness of breath, life threatening emergency, suicidal or homicidal thoughts you must seek medical attention immediately by calling 911 or calling your MD immediately  if symptoms less severe.  You Must read complete instructions/literature along with all the possible adverse reactions/side effects for all the Medicines you take and that have been prescribed to you. Take any new Medicines after you have completely understood and accpet all the possible adverse reactions/side effects.   Increase activity slowly   Complete by: As directed    MyChart COVID-19 home monitoring program   Complete by: Oct 18, 2019    Is the patient willing to use the [facility] for home monitoring?: Yes   Temperature monitoring   Complete by: Oct 18, 2019    After how many days would you like to receive a notification of this patient's flowsheet entries?: 1      Discharge Medications   Allergies as of 10/18/2019   No Known Allergies     Medication List    TAKE these medications   albuterol 108 (90 Base) MCG/ACT inhaler Commonly known as: VENTOLIN HFA Inhale 2 puffs into the lungs every 6 (six) hours as needed for wheezing or shortness of breath.   atenolol 50 MG tablet Commonly known as: TENORMIN Take 1 tablet (50 mg total) by mouth daily. Follow=up appt is due must see provider for future refills What changed: additional instructions   Contour Next Test test strip Generic drug: glucose  blood Use to check blood sugar level three times daily as directed   Coricidin D Cold/Flu/Sinus 2-5-325 MG Tabs Generic drug: Chlorphen-Phenyleph-APAP Take  2 tablets by mouth at bedtime as needed (cold/flu symptoms).   fexofenadine 180 MG tablet Commonly known as: ALLEGRA TAKE 1 TABLET BY MOUTH EVERY DAY   fluticasone 50 MCG/ACT nasal spray Commonly known as: FLONASE Place 2 sprays into both nostrils daily. What changed:   when to take this  reasons to take this   glimepiride 1 MG tablet Commonly known as: AMARYL TAKE 1 TABLET BY MOUTH EVERY DAY WITH breakfast What changed: See the new instructions.   guaiFENesin-dextromethorphan 100-10 MG/5ML syrup Commonly known as: ROBITUSSIN DM Take 5 mLs by mouth every 8 (eight) hours as needed for cough.   hydrochlorothiazide 25 MG tablet Commonly known as: HYDRODIURIL TAKE 1 TABLET BY MOUTH EVERY DAY   Lantus SoloStar 100 UNIT/ML Solostar Pen Generic drug: insulin glargine INJECT 35 UNITS INTO THE SKIN 2 TIMES DAILY What changed: See the new instructions.   losartan 100 MG tablet Commonly known as: COZAAR TAKE 1 TABLET BY MOUTH EVERY DAY   Microlet Lancets Misc CHECK BLOOD SUGAR LEVEL THREE TIMES DAILY AS DIRECTED   multivitamin with minerals Tabs tablet Take 1 tablet by mouth daily.   omeprazole 40 MG capsule Commonly known as: PRILOSEC Take 1 capsule (40 mg total) by mouth 2 (two) times daily. Please keep your February appointment with Dr. Tarri Glenn.   Pen Needles 31G X 8 MM Misc Use twice a day to inject insulin as instructed.   Previfem 0.25-35 MG-MCG tablet Generic drug: norgestimate-ethinyl estradiol TAKE 1 TABLET BY MOUTH EVERY DAY   topiramate 50 MG tablet Commonly known as: TOPAMAX TAKE 1 TABLET BY MOUTH 2 TIMES DAILY   Vitamin B12 1000 MCG Tbcr Take 1,000 mcg by mouth daily.   VITAMIN D PO Take 1 tablet by mouth daily.       Follow-up Information    Hoyt Koch, MD. Schedule an  appointment as soon as possible for a visit in 1 week(s).   Specialty: Internal Medicine Contact information: [place] [address] [phone]           Major procedures and Radiology Reports - PLEASE review detailed and final reports thoroughly  -        DG Chest Port 1 View  Result Date: 10/15/2019 CLINICAL DATA:  Worsening COVID symptoms, shortness of breath EXAM: PORTABLE CHEST 1 VIEW COMPARISON:  10/09/2019 FINDINGS: Mild cardiomegaly. Significant interval increase in heterogeneous bilateral airspace opacity, most conspicuous in the right upper lobe. The visualized skeletal structures are unremarkable. IMPRESSION: Significant interval increase in heterogeneous bilateral airspace opacity, most conspicuous in the right upper lobe, in keeping with COVID pneumonia. Electronically Signed   By: Eddie Candle M.D.   On: 10/15/2019 20:05   DG Chest Portable 1 View  Result Date: 10/09/2019 CLINICAL DATA:  Shortness of breath, COVID EXAM: PORTABLE CHEST 1 VIEW COMPARISON:  10/07/2019 FINDINGS: Mild patchy right upper lobe opacity. Left lung is clear. No pleural effusion or pneumothorax. Heart is normal in size. IMPRESSION: Mild right upper lobe opacity, likely reflecting pneumonia in this patient with known COVID. Electronically Signed   By: Julian Hy M.D.   On: 10/09/2019 21:54   DG Chest Port 1 View  Result Date: 10/07/2019 CLINICAL DATA:  Cough and fever. EXAM: PORTABLE CHEST 1 VIEW COMPARISON:  March 11, 2019 FINDINGS: The heart size and mediastinal contours are within normal limits. Both lungs are clear. The visualized skeletal structures are unremarkable. IMPRESSION: No active disease. Electronically Signed   By: Mallie Darting.D.  On: 10/07/2019 18:46    Micro Results     Recent Results (from the past 240 hour(s))  Blood Culture (routine x 2)     Status: None (Preliminary result)   Collection Time: 10/15/19  8:40 PM   Specimen: BLOOD RIGHT FOREARM   Result Value Ref Range Status   Specimen Description BLOOD RIGHT FOREARM  Final   Special Requests   Final    BOTTLES DRAWN AEROBIC AND ANAEROBIC Blood Culture results may not be optimal due to an excessive volume of blood received in culture bottles   Culture   Final    NO GROWTH 3 DAYS Performed at [facility], [place] [address] [place], [place] [address]    Report Status PENDING  Incomplete  Blood Culture (routine x 2)     Status: None (Preliminary result)   Collection Time: 10/15/19  8:45 PM   Specimen: BLOOD  Result Value Ref Range Status   Specimen Description BLOOD RIGHT ANTECUBITAL  Final   Special Requests   Final    BOTTLES DRAWN AEROBIC AND ANAEROBIC Blood Culture adequate volume   Culture   Final    NO GROWTH 3 DAYS Performed at [facility], [place] [address], [place], [place] [address]    Report Status PENDING  Incomplete    Today   Subjective    Emillee Talsma today has no headache,no chest abdominal pain,no new weakness tingling or numbness, feels much better wants to go home today.    Objective   Blood pressure (!) 151/85, pulse 90, temperature 97.6 F (36.4 C), temperature source Oral, resp. rate 16, height '5\' 4"'  (1.626 m), weight (!) 175.6 kg, SpO2 94 %.   Intake/Output Summary (Last 24 hours) at 10/18/2019 0927 Last data filed at 10/18/2019 0911 Gross per 24 hour  Intake 1370 ml  Output 1600 ml  Net -230 ml    Exam Awake Alert,   No new F.N deficits, Normal affect .AT,PERRAL Supple Neck,No JVD, No cervical lymphadenopathy appriciated.  Symmetrical Chest wall movement, Good air movement bilaterally, CTAB RRR,No Gallops,Rubs or new Murmurs, No Parasternal Heave +ve B.Sounds, Abd Soft, Non tender, No organomegaly appriciated, No rebound -guarding or rigidity. No Cyanosis, Clubbing or edema, No new Rash or bruise   Data Review   CBC w Diff:  Lab Results  Component Value Date   WBC 13.2 (H) 10/18/2019   HGB 11.3 (L) 10/18/2019   HCT  33.8 (L) 10/18/2019   PLT 603 (H) 10/18/2019   LYMPHOPCT 19 10/18/2019   MONOPCT 5 10/18/2019   EOSPCT 1 10/18/2019   BASOPCT 0 10/18/2019    CMP:  Lab Results  Component Value Date   NA 136 10/18/2019   K 4.2 10/18/2019   CL 101 10/18/2019   CO2 20 (L) 10/18/2019   BUN 13 10/18/2019   CREATININE 0.84 10/18/2019   CREATININE 0.67 12/16/2013   PROT 6.9 10/18/2019   ALBUMIN 3.1 (L) 10/18/2019   BILITOT 0.4 10/18/2019   ALKPHOS 61 10/18/2019   AST 29 10/18/2019   ALT 28 10/18/2019  .   Total Time in preparing paper work, data evaluation and todays exam - 6 minutes  Lala Lund M.D on 10/18/2019 at 9:27 AM  [facility]   Office  [phone]

## 2019-10-18 NOTE — Discharge Instructions (Addendum)
You are scheduled for an outpatient infusion of Remdesivir at 11:30 AM on Wednesday 4/7.  Please report to [facility] at [address]  Drive to the security guard and tell them you are here for an infusion. They will direct you to the front entrance where we will come and get you.  For questions call [phone].  Thanks         Follow with Primary MD Hoyt Koch, MD in 7 days   Get CBC, CMP, 2 view Chest X ray -  checked next visit within 1 week by Primary MD   Activity: As tolerated with Full fall precautions use walker/cane & assistance as needed  Disposition Home    Diet: Heart Healthy Low Carb  Accuchecks 4 times/day, Once in AM empty stomach and then before each meal. Log in all results and show them to your Prim.MD in 3 days. If any glucose reading is under 80 or above 300 call your Prim MD immidiately. Follow Low glucose instructions for glucose under 80 as instructed.   Special Instructions: If you have smoked or chewed Tobacco  in the last 2 yrs please stop smoking, stop any regular Alcohol  and or any Recreational drug use.  On your next visit with your primary care physician please Get Medicines reviewed and adjusted.  Please request your Prim.MD to go over all Hospital Tests and Procedure/Radiological results at the follow up, please get all Hospital records sent to your Prim MD by signing hospital release before you go home.  If you experience worsening of your admission symptoms, develop shortness of breath, life threatening emergency, suicidal or homicidal thoughts you must seek medical attention immediately by calling 911 or calling your MD immediately  if symptoms less severe.  You Must read complete instructions/literature along with all the possible adverse reactions/side effects for all the Medicines you take and that have been prescribed to you. Take any new Medicines after you have completely understood and accpet all the possible  adverse reactions/side effects.                                                                              [facility]                            [place], [place] [address]      Emily Phelps was admitted to the Hospital on 10/15/2019 and Discharged  10/18/2019 and should be excused from work/school   for 21  days starting from date -  10/15/2019 , may return to work/school without any restrictions.  Call Lala Lund MD, [facility]  [phone] with questions.  Lala Lund M.D on 10/18/2019,at 9:24 AM  [facility]   Office  [phone]     Person Under Monitoring Name: Jacqeline Stayner  Location: [place] [address]   Infection Prevention Recommendations for Individuals Confirmed to have, or Being Evaluated for, 2019 Novel Coronavirus (COVID-19)  Infection Who Receive Care at Home  Individuals who are confirmed to have, or are being evaluated for, COVID-19 should follow the prevention steps below until a healthcare provider or local or state health department says they can return to normal activities.  Stay home except to get medical care You should restrict activities outside your home, except for getting medical care. Do not go to work, school, or public areas, and do not use public transportation or taxis.  Call ahead before visiting your doctor Before your medical appointment, call the healthcare provider and tell them that you have, or are being evaluated for, COVID-19 infection. This will help the healthcare provider's office take steps to keep other people from getting infected. Ask your healthcare provider to call the local or state health department.  Monitor your symptoms Seek prompt medical attention if your illness is worsening (e.g., difficulty breathing). Before going to your medical appointment, call the healthcare provider and tell them that you have,  or are being evaluated for, COVID-19 infection. Ask your healthcare provider to call the local or state health department.  Wear a facemask You should wear a facemask that covers your nose and mouth when you are in the same room with other people and when you visit a healthcare provider. People who live with or visit you should also wear a facemask while they are in the same room with you.  Separate yourself from other people in your home As much as possible, you should stay in a different room from other people in your home. Also, you should use a separate bathroom, if available.  Avoid sharing household items You should not share dishes, drinking glasses, cups, eating utensils, towels, bedding, or other items with other people in your home. After using these items, you should wash them thoroughly with soap and water.  Cover your coughs and sneezes Cover your mouth and nose with a tissue when you cough or sneeze, or you can cough or sneeze into your sleeve. Throw used tissues in a lined trash can, and immediately wash your hands with soap and water for at least 20 seconds or use an alcohol-based hand rub.  Wash your [facility] your hands often and thoroughly with soap and water for at least 20 seconds. You can use an alcohol-based hand sanitizer if soap and water are not available and if your hands are not visibly dirty. Avoid touching your eyes, nose, and mouth with unwashed hands.   Prevention Steps for Caregivers and Household Members of Individuals Confirmed to have, or Being Evaluated for, COVID-19 Infection Being Cared for in the Home  If you live with, or provide care at home for, a person confirmed to have, or being evaluated for, COVID-19 infection please follow these guidelines to prevent infection:  Follow healthcare provider's instructions Make sure that you understand and can help the patient follow any healthcare provider instructions for all care.  Provide for the  patient's basic needs You should help the patient with basic needs in the home and provide support for getting groceries, prescriptions, and other personal needs.  Monitor the patient's symptoms If they are getting sicker, call his or her medical provider and tell them that the patient has, or is being evaluated for, COVID-19 infection. This will help the healthcare provider's office take steps to keep other people from getting infected. Ask the healthcare provider to call the local or state health department.  Limit the number of people who have contact with the patient  If possible, have only one caregiver for the patient.  Other household members should stay in another home or place of residence. If this is not possible, they should stay  in another room, or be separated from the patient as much as possible. Use a separate bathroom, if available.  Restrict visitors who do not have an essential need to be in the home.  Keep older adults, very young children, and other sick people away from the patient Keep older adults, very young children, and those who have compromised immune systems or chronic health conditions away from the patient. This includes people with chronic heart, lung, or kidney conditions, diabetes, and cancer.  Ensure good ventilation Make sure that shared spaces in the home have good air flow, such as from an air conditioner or an opened window, weather permitting.  Wash your hands often  Wash your hands often and thoroughly with soap and water for at least 20 seconds. You can use an alcohol based hand sanitizer if soap and water are not available and if your hands are not visibly dirty.  Avoid touching your eyes, nose, and mouth with unwashed hands.  Use disposable paper towels to dry your hands. If not available, use dedicated cloth towels and replace them when they become wet.  Wear a facemask and gloves  Wear a disposable facemask at all times in the room  and gloves when you touch or have contact with the patient's blood, body fluids, and/or secretions or excretions, such as sweat, saliva, sputum, nasal mucus, vomit, urine, or feces.  Ensure the mask fits over your nose and mouth tightly, and do not touch it during use.  Throw out disposable facemasks and gloves after using them. Do not reuse.  Wash your hands immediately after removing your facemask and gloves.  If your personal clothing becomes contaminated, carefully remove clothing and launder. Wash your hands after handling contaminated clothing.  Place all used disposable facemasks, gloves, and other waste in a lined container before disposing them with other household waste.  Remove gloves and wash your hands immediately after handling these items.  Do not share dishes, glasses, or other household items with the patient  Avoid sharing household items. You should not share dishes, drinking glasses, cups, eating utensils, towels, bedding, or other items with a patient who is confirmed to have, or being evaluated for, COVID-19 infection.  After the person uses these items, you should wash them thoroughly with soap and water.  Wash laundry thoroughly  Immediately remove and wash clothes or bedding that have blood, body fluids, and/or secretions or excretions, such as sweat, saliva, sputum, nasal mucus, vomit, urine, or feces, on them.  Wear gloves when handling laundry from the patient.  Read and follow directions on labels of laundry or clothing items and detergent. In general, wash and dry with the warmest temperatures recommended on the label.  Clean all areas the individual has used often  Clean all touchable surfaces, such as counters, tabletops, doorknobs, bathroom fixtures, toilets, phones, keyboards, tablets, and bedside tables, every day. Also, clean any surfaces that may have blood, body fluids, and/or secretions or excretions on them.  Wear gloves when cleaning surfaces the  patient has come in contact with.  Use a diluted bleach solution (e.g., dilute bleach with 1 part bleach and 10 parts water) or a household disinfectant with a label that says EPA-registered for coronaviruses. To make a bleach solution at home, add 1 tablespoon of bleach to 1 quart (4 cups)  of water. For a larger supply, add  cup of bleach to 1 gallon (16 cups) of water.  Read labels of cleaning products and follow recommendations provided on product labels. Labels contain instructions for safe and effective use of the cleaning product including precautions you should take when applying the product, such as wearing gloves or eye protection and making sure you have good ventilation during use of the product.  Remove gloves and wash hands immediately after cleaning.  Monitor yourself for signs and symptoms of illness Caregivers and household members are considered close contacts, should monitor their health, and will be asked to limit movement outside of the home to the extent possible. Follow the monitoring steps for close contacts listed on the symptom monitoring form.   ? If you have additional questions, contact your local health department or call the epidemiologist on call at [phone] (available 24/7). ? This guidance is subject to change. For the most up-to-date guidance from [facility], please refer to their website: [email or web address]

## 2019-10-18 NOTE — Progress Notes (Signed)
Patient scheduled for outpatient Remdesivir infusion at 11:30 AM on Wednesday 4/7.  Please advise them to report to [facility] at [address]  Drive to the security guard and tell them you are here for an infusion. They will direct you to the front entrance where we will come and get you.  For questions call [phone].  Thanks

## 2019-10-18 NOTE — Progress Notes (Signed)
Emily Phelps to be D/C'd Home per MD order. Discussed with the patient and all questions fully answered.  Allergies as of 10/18/2019   No Known Allergies     Medication List    TAKE these medications   albuterol 108 (90 Base) MCG/ACT inhaler Commonly known as: VENTOLIN HFA Inhale 2 puffs into the lungs every 6 (six) hours as needed for wheezing or shortness of breath.   atenolol 50 MG tablet Commonly known as: TENORMIN Take 1 tablet (50 mg total) by mouth daily. Follow=up appt is due must see provider for future refills What changed: additional instructions   Contour Next Test test strip Generic drug: glucose blood Use to check blood sugar level three times daily as directed   Coricidin D Cold/Flu/Sinus 2-5-325 MG Tabs Generic drug: Chlorphen-Phenyleph-APAP Take 2 tablets by mouth at bedtime as needed (cold/flu symptoms).   fexofenadine 180 MG tablet Commonly known as: ALLEGRA TAKE 1 TABLET BY MOUTH EVERY DAY   fluticasone 50 MCG/ACT nasal spray Commonly known as: FLONASE Place 2 sprays into both nostrils daily. What changed:   when to take this  reasons to take this   glimepiride 1 MG tablet Commonly known as: AMARYL TAKE 1 TABLET BY MOUTH EVERY DAY WITH breakfast What changed: See the new instructions.   guaiFENesin-dextromethorphan 100-10 MG/5ML syrup Commonly known as: ROBITUSSIN DM Take 5 mLs by mouth every 8 (eight) hours as needed for cough.   hydrochlorothiazide 25 MG tablet Commonly known as: HYDRODIURIL TAKE 1 TABLET BY MOUTH EVERY DAY   Lantus SoloStar 100 UNIT/ML Solostar Pen Generic drug: insulin glargine INJECT 35 UNITS INTO THE SKIN 2 TIMES DAILY What changed: See the new instructions.   losartan 100 MG tablet Commonly known as: COZAAR TAKE 1 TABLET BY MOUTH EVERY DAY   Microlet Lancets Misc CHECK BLOOD SUGAR LEVEL THREE TIMES DAILY AS DIRECTED   multivitamin with minerals Tabs tablet Take 1 tablet by mouth daily.   omeprazole 40 MG  capsule Commonly known as: PRILOSEC Take 1 capsule (40 mg total) by mouth 2 (two) times daily. Please keep your February appointment with Dr. Tarri Glenn.   Pen Needles 31G X 8 MM Misc Use twice a day to inject insulin as instructed.   Previfem 0.25-35 MG-MCG tablet Generic drug: norgestimate-ethinyl estradiol TAKE 1 TABLET BY MOUTH EVERY DAY   topiramate 50 MG tablet Commonly known as: TOPAMAX TAKE 1 TABLET BY MOUTH 2 TIMES DAILY   Vitamin B12 1000 MCG Tbcr Take 1,000 mcg by mouth daily.   VITAMIN D PO Take 1 tablet by mouth daily.       VVS, Skin clean, dry and intact without evidence of skin break down, no evidence of skin tears noted.  IV catheter discontinued intact. Site without signs and symptoms of complications. Dressing and pressure applied.  An After Visit Summary was printed and given to the patient.  Patient escorted via [place], and D/C home via private auto.  Ascencion Dike  10/18/2019 3:12 PM

## 2019-10-19 ENCOUNTER — Ambulatory Visit (HOSPITAL_COMMUNITY)
Admission: RE | Admit: 2019-10-19 | Discharge: 2019-10-19 | Disposition: A | Discharge status: Home / Self Care | Payer: BC Managed Care – PPO | Source: Ambulatory Visit | Attending: Pulmonary Disease | Admitting: Pulmonary Disease

## 2019-10-19 ENCOUNTER — Encounter (INDEPENDENT_AMBULATORY_CARE_PROVIDER_SITE_OTHER): Payer: Self-pay

## 2019-10-19 DIAGNOSIS — U071 COVID-19: Secondary | ICD-10-CM | POA: Diagnosis not present

## 2019-10-19 DIAGNOSIS — J1282 Pneumonia due to coronavirus disease 2019: Secondary | ICD-10-CM | POA: Diagnosis not present

## 2019-10-19 MED ORDER — DIPHENHYDRAMINE HCL 50 MG/ML IJ SOLN: 50.0000 mg | Freq: Once | INTRAMUSCULAR | Status: DC | PRN

## 2019-10-19 MED ORDER — EPINEPHRINE 0.3 MG/0.3ML IJ SOAJ: 0.3000 mg | Freq: Once | INTRAMUSCULAR | Status: DC | PRN

## 2019-10-19 MED ORDER — FAMOTIDINE IN NACL 20-0.9 MG/50ML-% IV SOLN: 20.0000 mg | Freq: Once | INTRAVENOUS | Status: DC | PRN

## 2019-10-19 MED ORDER — SODIUM CHLORIDE 0.9 % IV SOLN: INTRAVENOUS | Status: DC | PRN

## 2019-10-19 MED ORDER — SODIUM CHLORIDE 0.9 % IV SOLN
100.0000 mg | Freq: Once | INTRAVENOUS | Status: AC
  Administered 2019-10-19: 100 mg via INTRAVENOUS
  Filled 2019-10-19: qty 20

## 2019-10-19 MED ORDER — METHYLPREDNISOLONE SODIUM SUCC 125 MG IJ SOLR: 125.0000 mg | Freq: Once | INTRAMUSCULAR | Status: DC | PRN

## 2019-10-19 MED ORDER — ALBUTEROL SULFATE HFA 108 (90 BASE) MCG/ACT IN AERS: 2.0000 | INHALATION_SPRAY | Freq: Once | RESPIRATORY_TRACT | Status: DC | PRN

## 2019-10-19 NOTE — Progress Notes (Signed)
  Diagnosis: COVID-19  Physician: Asencion Noble  Procedure: [facility] Med: remdesivir infusion - Provided patient with remdesivir fact sheet for patients, parents and caregivers prior to infusion.  Complications: No immediate complications noted.  Discharge: Discharged home   [place], [place] C 10/19/2019

## 2019-10-19 NOTE — Discharge Instructions (Signed)

## 2019-10-20 ENCOUNTER — Encounter (INDEPENDENT_AMBULATORY_CARE_PROVIDER_SITE_OTHER): Payer: Self-pay

## 2019-10-20 LAB — CULTURE, BLOOD (ROUTINE X 2)
Culture: NO GROWTH
Culture: NO GROWTH
Special Requests: ADEQUATE

## 2019-10-21 ENCOUNTER — Encounter (INDEPENDENT_AMBULATORY_CARE_PROVIDER_SITE_OTHER): Payer: Self-pay

## 2019-10-22 ENCOUNTER — Encounter (INDEPENDENT_AMBULATORY_CARE_PROVIDER_SITE_OTHER): Payer: Self-pay

## 2019-10-23 ENCOUNTER — Encounter (INDEPENDENT_AMBULATORY_CARE_PROVIDER_SITE_OTHER): Payer: Self-pay

## 2019-10-24 ENCOUNTER — Other Ambulatory Visit: Payer: Self-pay

## 2019-10-24 ENCOUNTER — Other Ambulatory Visit: Payer: Self-pay | Admitting: Gastroenterology

## 2019-10-24 ENCOUNTER — Encounter (INDEPENDENT_AMBULATORY_CARE_PROVIDER_SITE_OTHER): Payer: Self-pay

## 2019-10-24 MED ORDER — OMEPRAZOLE 40 MG PO CPDR: 40.0000 mg | DELAYED_RELEASE_CAPSULE | Freq: Two times a day (BID) | ORAL | 0 refills | Status: DC

## 2019-10-25 ENCOUNTER — Encounter (INDEPENDENT_AMBULATORY_CARE_PROVIDER_SITE_OTHER): Payer: Self-pay

## 2019-10-25 ENCOUNTER — Telehealth: Payer: Self-pay | Admitting: Internal Medicine

## 2019-10-25 MED ORDER — PEN NEEDLES 31G X 8 MM MISC: 3 refills | Status: DC

## 2019-10-25 NOTE — Telephone Encounter (Signed)
Reviewed chart pt is up-to-date sent refills to [facility].../lmb  

## 2019-10-25 NOTE — Telephone Encounter (Signed)
New message:   1.Medication Requested: Insulin Pen Needle (PEN NEEDLES) 31G X 8 MM MISC 2. Pharmacy (Name, Street, [facility]): [facility] - [place], [place] - [address] 3. On Med List: yes  4. Last Visit with PCP: 09/19/19  5. Next visit date with PCP:11/04/19    Pt states she has been using the same pen needle for the last 4 days and her pharmacy has sent over a request but no response. Please advise Agent: Please be advised that RX refills may take up to 3 business days. We ask that you follow-up with your pharmacy.

## 2019-10-26 ENCOUNTER — Encounter (INDEPENDENT_AMBULATORY_CARE_PROVIDER_SITE_OTHER): Payer: Self-pay

## 2019-10-27 ENCOUNTER — Encounter (INDEPENDENT_AMBULATORY_CARE_PROVIDER_SITE_OTHER): Payer: Self-pay

## 2019-10-27 DIAGNOSIS — Z0279 Encounter for issue of other medical certificate: Secondary | ICD-10-CM

## 2019-10-27 NOTE — Telephone Encounter (Signed)
Forms have been signed, Faxed to [employer] @ [phone], Copy sent to scan &Charged for.   Patient informed by my chart.

## 2019-10-28 ENCOUNTER — Encounter (INDEPENDENT_AMBULATORY_CARE_PROVIDER_SITE_OTHER): Payer: Self-pay

## 2019-10-28 DIAGNOSIS — Z713 Dietary counseling and surveillance: Secondary | ICD-10-CM | POA: Diagnosis not present

## 2019-10-28 DIAGNOSIS — F4323 Adjustment disorder with mixed anxiety and depressed mood: Secondary | ICD-10-CM | POA: Diagnosis not present

## 2019-10-30 ENCOUNTER — Encounter (INDEPENDENT_AMBULATORY_CARE_PROVIDER_SITE_OTHER): Payer: Self-pay

## 2019-10-31 ENCOUNTER — Encounter (INDEPENDENT_AMBULATORY_CARE_PROVIDER_SITE_OTHER): Payer: Self-pay

## 2019-11-04 ENCOUNTER — Ambulatory Visit (INDEPENDENT_AMBULATORY_CARE_PROVIDER_SITE_OTHER): Payer: BC Managed Care – PPO

## 2019-11-04 ENCOUNTER — Encounter: Payer: Self-pay | Admitting: Internal Medicine

## 2019-11-04 ENCOUNTER — Other Ambulatory Visit: Payer: Self-pay

## 2019-11-04 ENCOUNTER — Ambulatory Visit: Payer: BC Managed Care – PPO | Admitting: Internal Medicine

## 2019-11-04 VITALS — BP 160/98 | HR 82 | Temp 100.0°F | Ht 64.0 in | Wt >= 6400 oz

## 2019-11-04 DIAGNOSIS — Z87898 Personal history of other specified conditions: Secondary | ICD-10-CM | POA: Diagnosis not present

## 2019-11-04 DIAGNOSIS — U071 COVID-19: Secondary | ICD-10-CM

## 2019-11-04 DIAGNOSIS — J9601 Acute respiratory failure with hypoxia: Secondary | ICD-10-CM

## 2019-11-04 DIAGNOSIS — Z794 Long term (current) use of insulin: Secondary | ICD-10-CM

## 2019-11-04 DIAGNOSIS — R509 Fever, unspecified: Secondary | ICD-10-CM | POA: Diagnosis not present

## 2019-11-04 DIAGNOSIS — E119 Type 2 diabetes mellitus without complications: Secondary | ICD-10-CM | POA: Diagnosis not present

## 2019-11-04 DIAGNOSIS — Z0279 Encounter for issue of other medical certificate: Secondary | ICD-10-CM

## 2019-11-04 LAB — COMPREHENSIVE METABOLIC PANEL
ALT: 21 U/L (ref 0–35)
AST: 15 U/L (ref 0–37)
Albumin: 3.9 g/dL (ref 3.5–5.2)
Alkaline Phosphatase: 67 U/L (ref 39–117)
BUN: 16 mg/dL (ref 6–23)
CO2: 26 mEq/L (ref 19–32)
Calcium: 10.3 mg/dL (ref 8.4–10.5)
Chloride: 107 mEq/L (ref 96–112)
Creatinine, Ser: 0.76 mg/dL (ref 0.40–1.20)
GFR: 103.99 mL/min (ref >60.00)
Glucose, Bld: 130 mg/dL — ABNORMAL HIGH (ref 70–99)
Potassium: 3.7 mEq/L (ref 3.5–5.1)
Sodium: 141 mEq/L (ref 135–145)
Total Bilirubin: 0.5 mg/dL (ref 0.2–1.2)
Total Protein: 7.1 g/dL (ref 6.0–8.3)

## 2019-11-04 LAB — CBC
HCT: 36.4 % (ref 36.0–46.0)
Hemoglobin: 12 g/dL (ref 12.0–15.0)
MCHC: 33 g/dL (ref 30.0–36.0)
MCV: 79.4 fl (ref 78.0–100.0)
Platelets: 318 10*3/uL (ref 150.0–400.0)
RBC: 4.59 Mil/uL (ref 3.87–5.11)
RDW: 19.6 % — ABNORMAL HIGH (ref 11.5–15.5)
WBC: 8.2 10*3/uL (ref 4.0–10.5)

## 2019-11-04 MED ORDER — PREDNISONE 20 MG PO TABS: 40.0000 mg | ORAL_TABLET | Freq: Every day | ORAL | 0 refills | Status: DC

## 2019-11-04 MED ORDER — PROMETHAZINE-CODEINE 6.25-10 MG/5ML PO SYRP: 5.0000 mL | ORAL_SOLUTION | Freq: Four times a day (QID) | ORAL | 0 refills | Status: DC | PRN

## 2019-11-04 NOTE — Telephone Encounter (Signed)
Forms have been signed, Faxed to [employer], Copy sent to scan &Charged for.   Original left up front for patient to pick up during OV.

## 2019-11-04 NOTE — Patient Instructions (Addendum)
May 8th is when you are eligible to get covid1-9 shot if desired.   We have sent in steroids to take prednisone 2 pills daily (40 mg total) for 1 week.   We have sent in cough medicine to use up to 3 times a day as needed for coughing so we can get you to stop coughing as much to give the inflammation in the lungs time to go down.   We are doing labs and a chest x-ray today and will be back in touch about the results.   We have extended your FMLA leave for 3 weeks and want to see you back in office in 2 weeks to re-evaluate. Call or come back sooner if not improving.

## 2019-11-04 NOTE — Assessment & Plan Note (Signed)
Still with significant ADL limiting symptoms including fatigue, SOB, cough. Rx prednisone course and promethazine/codeine cough syrup to see if we can get the coughing and SOB improved. Advised to increase activity as able. Checking CBC and CMP today. Checking repeat CXR and if no improvement will need antibiotic course as well. FMLA changed to move return to work date to 11/27/19 and will need follow up here in 2 weeks to assess condition.

## 2019-11-04 NOTE — Assessment & Plan Note (Signed)
Needs referral for sleep study as prior insurance did not cover and she could not get done.

## 2019-11-04 NOTE — Assessment & Plan Note (Signed)
HgA1c slightly higher when checked during admission at 6.3 and sugars are elevated currently which is not surprising after steroid course. Will need another steroid course and advised to let us know

## 2019-11-04 NOTE — Assessment & Plan Note (Signed)
Weight relatively stable considering steroids several weeks ago.

## 2019-11-04 NOTE — Progress Notes (Signed)
Subjective:   Patient ID: Emily Phelps, female    DOB: 09-Aug-1980, 39 y.o.   MRN: [id number]  HPI The patient is a 39 YO female coming in for follow up for covid-19. Hospitalized in early April with respiratory failure. Did require steroids and remdesivir but did not require supplemental oxygen. She has been out of work since that time and is not ready to return to work. She has not been doing well at home. Still coughing quite a lot and getting SOB with coughing and moving around. She has tried to do 1 walk daily and the pollen outside has been causing this to not work well. Doing breathing exercises at home which help some. Not able to sleep flat due to coughing spells which last hours. Sleeping upright which is causing more swelling in feet and legs as well as soreness. Denies fevers or chills. Denies change in taste. Some decrease in smell. Appetite is still poor. Sugars are running higher than average with some in 200s and this morning's fasting 130s. She is getting tired very easily even with talking to family members and sleeping during the day to accommodate.   PMH, [facility], social history reviewed and updated  March 29th first day missed work.   Review of Systems  Constitutional: Positive for activity change, appetite change and fatigue. Negative for chills, diaphoresis, fever and unexpected weight change.  HENT: Positive for congestion and postnasal drip. Negative for ear discharge, ear pain, sinus pain, sneezing, sore throat, tinnitus, trouble swallowing and voice change.   Eyes: Negative.   Respiratory: Positive for cough, chest tightness and shortness of breath. Negative for apnea, choking, wheezing and stridor.   Cardiovascular: Positive for leg swelling. Negative for chest pain and palpitations.  Gastrointestinal: Negative.  Negative for abdominal distention, abdominal pain, constipation, diarrhea, nausea and vomiting.  Musculoskeletal: Negative.   Skin: Negative.   Neurological:  Negative.   Psychiatric/Behavioral: Positive for sleep disturbance.    Objective:  Physical Exam Constitutional:      Appearance: She is well-developed. She is obese.     Comments: Fatigued and with SOB with walking in the office <100 feet  HENT:     Head: Normocephalic and atraumatic.  Cardiovascular:     Rate and Rhythm: Normal rate and regular rhythm.  Pulmonary:     Effort: Pulmonary effort is normal. No respiratory distress.     Breath sounds: No wheezing or rales.     Comments: No overt wheezing detected Abdominal:     General: Bowel sounds are normal. There is no distension.     Palpations: Abdomen is soft.     Tenderness: There is no abdominal tenderness. There is no rebound.  Musculoskeletal:     Cervical back: Normal range of motion.  Skin:    General: Skin is warm and dry.  Neurological:     Mental Status: She is alert and oriented to person, place, and time.     Coordination: Coordination normal.     Vitals:   11/04/19 0916  BP: (!) 160/98  Pulse: 82  Temp: 100 F (37.8 C)  SpO2: 99%  Weight: (!) 404 lb (183.3 kg)  Height: 5\' 4"  (1.626 m)    This visit occurred during the SARS-CoV-2 public health emergency.  Safety protocols were in place, including screening questions prior to the visit, additional usage of staff PPE, and extensive cleaning of exam room while observing appropriate contact time as indicated for disinfecting solutions.   Assessment & Plan:

## 2019-11-10 ENCOUNTER — Other Ambulatory Visit: Payer: Self-pay

## 2019-11-10 ENCOUNTER — Ambulatory Visit: Payer: BC Managed Care – PPO

## 2019-11-10 DIAGNOSIS — Z87898 Personal history of other specified conditions: Secondary | ICD-10-CM

## 2019-11-10 DIAGNOSIS — G4733 Obstructive sleep apnea (adult) (pediatric): Secondary | ICD-10-CM | POA: Diagnosis not present

## 2019-11-11 DIAGNOSIS — F4323 Adjustment disorder with mixed anxiety and depressed mood: Secondary | ICD-10-CM | POA: Diagnosis not present

## 2019-11-14 DIAGNOSIS — Z6841 Body Mass Index (BMI) 40.0 and over, adult: Secondary | ICD-10-CM | POA: Diagnosis not present

## 2019-11-14 DIAGNOSIS — Z1151 Encounter for screening for human papillomavirus (HPV): Secondary | ICD-10-CM | POA: Diagnosis not present

## 2019-11-14 DIAGNOSIS — Z01419 Encounter for gynecological examination (general) (routine) without abnormal findings: Secondary | ICD-10-CM | POA: Diagnosis not present

## 2019-11-15 DIAGNOSIS — G4733 Obstructive sleep apnea (adult) (pediatric): Secondary | ICD-10-CM | POA: Diagnosis not present

## 2019-11-16 ENCOUNTER — Encounter: Payer: Self-pay | Admitting: Internal Medicine

## 2019-11-18 ENCOUNTER — Encounter: Payer: Self-pay | Admitting: Internal Medicine

## 2019-11-18 ENCOUNTER — Ambulatory Visit: Payer: BC Managed Care – PPO | Admitting: Internal Medicine

## 2019-11-18 ENCOUNTER — Other Ambulatory Visit: Payer: Self-pay

## 2019-11-18 DIAGNOSIS — Z794 Long term (current) use of insulin: Secondary | ICD-10-CM

## 2019-11-18 DIAGNOSIS — J9601 Acute respiratory failure with hypoxia: Secondary | ICD-10-CM

## 2019-11-18 DIAGNOSIS — E119 Type 2 diabetes mellitus without complications: Secondary | ICD-10-CM

## 2019-11-18 DIAGNOSIS — U071 COVID-19: Secondary | ICD-10-CM

## 2019-11-18 DIAGNOSIS — G4733 Obstructive sleep apnea (adult) (pediatric): Secondary | ICD-10-CM | POA: Diagnosis not present

## 2019-11-18 NOTE — Progress Notes (Signed)
   Subjective:   Patient ID: Emily Phelps, female    DOB: 1980/09/02, 39 y.o.   MRN: [id number]  HPI The patient is a 39 YO female coming in for follow up covid-19. At last visit she was not significantly improved and still with significant cough, SOB, and fatigue. Her FMLA was extended to May 16th and she was given another course of steroids. She is now doing better with less coughing. Less SOB although still mild. Her energy is gradually increasing but still suboptimal. CXR at last visit was clear. Does feel she will be ready to return to work on 11/27/19. Is gradually forcing herself to do more around the house. She has been monitoring sugars and they have been better now that she is off steroids. She would also like to discuss recent sleep study and new mild OSA diagnosis.   Review of Systems  Constitutional: Positive for activity change, appetite change and fatigue.  HENT: Negative.   Eyes: Negative.   Respiratory: Positive for cough and shortness of breath. Negative for chest tightness.        Improving  Cardiovascular: Negative for chest pain, palpitations and leg swelling.  Gastrointestinal: Negative for abdominal distention, abdominal pain, constipation, diarrhea, nausea and vomiting.  Musculoskeletal: Negative.   Skin: Negative.   Neurological: Negative.   Psychiatric/Behavioral: Negative.     Objective:  Physical Exam Constitutional:      Appearance: She is well-developed. She is obese.  HENT:     Head: Normocephalic and atraumatic.  Cardiovascular:     Rate and Rhythm: Normal rate and regular rhythm.  Pulmonary:     Effort: Pulmonary effort is normal. No respiratory distress.     Breath sounds: Normal breath sounds. No wheezing or rales.  Abdominal:     General: Bowel sounds are normal. There is no distension.     Palpations: Abdomen is soft.     Tenderness: There is no abdominal tenderness. There is no rebound.  Musculoskeletal:     Cervical back: Normal range of  motion.  Skin:    General: Skin is warm and dry.  Neurological:     Mental Status: She is alert and oriented to person, place, and time.     Coordination: Coordination normal.     Vitals:   11/18/19 0817  BP: 134/82  Pulse: 84  Temp: 98.2 F (36.8 C)  SpO2: 99%  Weight: (!) 402 lb 9.6 oz (182.6 kg)  Height: 5\' 4"  (1.626 m)    This visit occurred during the SARS-CoV-2 public health emergency.  Safety protocols were in place, including screening questions prior to the visit, additional usage of staff PPE, and extensive cleaning of exam room while observing appropriate contact time as indicated for disinfecting solutions.   Assessment & Plan:

## 2019-11-18 NOTE — Assessment & Plan Note (Signed)
Taking amaryl, lantus and morning sugars are running 80-110 which is good considering multiple steroid courses recently.

## 2019-11-18 NOTE — Patient Instructions (Signed)
We will get the forms done and back to you.

## 2019-11-18 NOTE — Assessment & Plan Note (Signed)
Will okay return to work on 11/27/19 and disability forms filled out again today as they were denied for some reason.

## 2019-11-18 NOTE — Assessment & Plan Note (Signed)
Mild and we talked about the implications of that and that CPAP is not needed. Avoid back sleeping and work on weight loss if possible.

## 2019-11-24 DIAGNOSIS — N921 Excessive and frequent menstruation with irregular cycle: Secondary | ICD-10-CM | POA: Diagnosis not present

## 2019-11-24 DIAGNOSIS — D251 Intramural leiomyoma of uterus: Secondary | ICD-10-CM | POA: Diagnosis not present

## 2019-11-24 DIAGNOSIS — D252 Subserosal leiomyoma of uterus: Secondary | ICD-10-CM | POA: Diagnosis not present

## 2019-12-02 DIAGNOSIS — Z713 Dietary counseling and surveillance: Secondary | ICD-10-CM | POA: Diagnosis not present

## 2019-12-02 DIAGNOSIS — F4323 Adjustment disorder with mixed anxiety and depressed mood: Secondary | ICD-10-CM | POA: Diagnosis not present

## 2019-12-04 ENCOUNTER — Other Ambulatory Visit: Payer: Self-pay

## 2019-12-05 MED ORDER — OMEPRAZOLE 40 MG PO CPDR: 40.0000 mg | DELAYED_RELEASE_CAPSULE | Freq: Two times a day (BID) | ORAL | 3 refills | Status: DC

## 2019-12-09 ENCOUNTER — Other Ambulatory Visit: Payer: Self-pay | Admitting: Gastroenterology

## 2019-12-20 ENCOUNTER — Other Ambulatory Visit: Payer: Self-pay | Admitting: Internal Medicine

## 2019-12-30 DIAGNOSIS — H40003 Preglaucoma, unspecified, bilateral: Secondary | ICD-10-CM | POA: Diagnosis not present

## 2020-01-02 ENCOUNTER — Emergency Department (HOSPITAL_COMMUNITY): Payer: BC Managed Care – PPO

## 2020-01-02 ENCOUNTER — Other Ambulatory Visit: Payer: Self-pay

## 2020-01-02 ENCOUNTER — Other Ambulatory Visit: Payer: Self-pay | Admitting: Internal Medicine

## 2020-01-02 ENCOUNTER — Inpatient Hospital Stay (HOSPITAL_COMMUNITY)
Admit: 2020-01-02 | Discharge: 2020-01-05 | DRG: 871 | Disposition: A | Discharge status: Home / Self Care | Payer: BC Managed Care – PPO | Attending: Family Medicine | Admitting: Family Medicine

## 2020-01-02 ENCOUNTER — Encounter (HOSPITAL_COMMUNITY): Payer: Self-pay

## 2020-01-02 ENCOUNTER — Telehealth: Payer: Self-pay | Admitting: Nurse Practitioner

## 2020-01-02 DIAGNOSIS — Z8042 Family history of malignant neoplasm of prostate: Secondary | ICD-10-CM | POA: Diagnosis not present

## 2020-01-02 DIAGNOSIS — Z8616 Personal history of COVID-19: Secondary | ICD-10-CM

## 2020-01-02 DIAGNOSIS — E119 Type 2 diabetes mellitus without complications: Secondary | ICD-10-CM

## 2020-01-02 DIAGNOSIS — Z79899 Other long term (current) drug therapy: Secondary | ICD-10-CM | POA: Diagnosis not present

## 2020-01-02 DIAGNOSIS — R6510 Systemic inflammatory response syndrome (SIRS) of non-infectious origin without acute organ dysfunction: Secondary | ICD-10-CM

## 2020-01-02 DIAGNOSIS — E1169 Type 2 diabetes mellitus with other specified complication: Secondary | ICD-10-CM

## 2020-01-02 DIAGNOSIS — A419 Sepsis, unspecified organism: Secondary | ICD-10-CM | POA: Diagnosis not present

## 2020-01-02 DIAGNOSIS — J159 Unspecified bacterial pneumonia: Secondary | ICD-10-CM | POA: Diagnosis present

## 2020-01-02 DIAGNOSIS — Z8371 Family history of colonic polyps: Secondary | ICD-10-CM | POA: Diagnosis not present

## 2020-01-02 DIAGNOSIS — G4730 Sleep apnea, unspecified: Secondary | ICD-10-CM | POA: Diagnosis present

## 2020-01-02 DIAGNOSIS — Z7952 Long term (current) use of systemic steroids: Secondary | ICD-10-CM

## 2020-01-02 DIAGNOSIS — J181 Lobar pneumonia, unspecified organism: Secondary | ICD-10-CM

## 2020-01-02 DIAGNOSIS — R160 Hepatomegaly, not elsewhere classified: Secondary | ICD-10-CM | POA: Diagnosis not present

## 2020-01-02 DIAGNOSIS — Z791 Long term (current) use of non-steroidal anti-inflammatories (NSAID): Secondary | ICD-10-CM

## 2020-01-02 DIAGNOSIS — A481 Legionnaires' disease: Secondary | ICD-10-CM | POA: Diagnosis present

## 2020-01-02 DIAGNOSIS — I10 Essential (primary) hypertension: Secondary | ICD-10-CM | POA: Diagnosis present

## 2020-01-02 DIAGNOSIS — E876 Hypokalemia: Secondary | ICD-10-CM | POA: Diagnosis not present

## 2020-01-02 DIAGNOSIS — R000 Tachycardia, unspecified: Secondary | ICD-10-CM | POA: Diagnosis present

## 2020-01-02 DIAGNOSIS — Z8249 Family history of ischemic heart disease and other diseases of the circulatory system: Secondary | ICD-10-CM | POA: Diagnosis not present

## 2020-01-02 DIAGNOSIS — J189 Pneumonia, unspecified organism: Secondary | ICD-10-CM

## 2020-01-02 DIAGNOSIS — G43909 Migraine, unspecified, not intractable, without status migrainosus: Secondary | ICD-10-CM | POA: Diagnosis present

## 2020-01-02 DIAGNOSIS — D509 Iron deficiency anemia, unspecified: Secondary | ICD-10-CM | POA: Diagnosis not present

## 2020-01-02 DIAGNOSIS — Z6841 Body Mass Index (BMI) 40.0 and over, adult: Secondary | ICD-10-CM

## 2020-01-02 DIAGNOSIS — E785 Hyperlipidemia, unspecified: Secondary | ICD-10-CM | POA: Diagnosis present

## 2020-01-02 DIAGNOSIS — G4733 Obstructive sleep apnea (adult) (pediatric): Secondary | ICD-10-CM | POA: Diagnosis not present

## 2020-01-02 DIAGNOSIS — I517 Cardiomegaly: Secondary | ICD-10-CM | POA: Diagnosis not present

## 2020-01-02 DIAGNOSIS — R0602 Shortness of breath: Secondary | ICD-10-CM | POA: Diagnosis not present

## 2020-01-02 DIAGNOSIS — Z833 Family history of diabetes mellitus: Secondary | ICD-10-CM | POA: Diagnosis not present

## 2020-01-02 DIAGNOSIS — R21 Rash and other nonspecific skin eruption: Secondary | ICD-10-CM | POA: Diagnosis not present

## 2020-01-02 DIAGNOSIS — Z8701 Personal history of pneumonia (recurrent): Secondary | ICD-10-CM

## 2020-01-02 DIAGNOSIS — Z794 Long term (current) use of insulin: Secondary | ICD-10-CM

## 2020-01-02 LAB — CBC WITH DIFFERENTIAL/PLATELET
Abs Immature Granulocytes: 0.11 10*3/uL — ABNORMAL HIGH (ref 0.00–0.07)
Basophils Absolute: 0 10*3/uL (ref 0.0–0.1)
Basophils Relative: 0 %
Eosinophils Absolute: 0.1 10*3/uL (ref 0.0–0.5)
Eosinophils Relative: 1 %
HCT: 33.8 % — ABNORMAL LOW (ref 36.0–46.0)
Hemoglobin: 11.1 g/dL — ABNORMAL LOW (ref 12.0–15.0)
Immature Granulocytes: 1 %
Lymphocytes Relative: 15 %
Lymphs Abs: 2.5 10*3/uL (ref 0.7–4.0)
MCH: 26.2 pg (ref 26.0–34.0)
MCHC: 32.8 g/dL (ref 30.0–36.0)
MCV: 79.7 fL — ABNORMAL LOW (ref 80.0–100.0)
Monocytes Absolute: 1.4 10*3/uL — ABNORMAL HIGH (ref 0.1–1.0)
Monocytes Relative: 8 %
Neutro Abs: 12.5 10*3/uL — ABNORMAL HIGH (ref 1.7–7.7)
Neutrophils Relative %: 75 %
Platelets: 344 10*3/uL (ref 150–400)
RBC: 4.24 MIL/uL (ref 3.87–5.11)
RDW: 16.3 % — ABNORMAL HIGH (ref 11.5–15.5)
WBC: 16.6 10*3/uL — ABNORMAL HIGH (ref 4.0–10.5)
nRBC: 0 % (ref 0.0–0.2)

## 2020-01-02 LAB — COMPREHENSIVE METABOLIC PANEL
ALT: 24 U/L (ref 0–44)
AST: 20 U/L (ref 15–41)
Albumin: 3.7 g/dL (ref 3.5–5.0)
Alkaline Phosphatase: 79 U/L (ref 38–126)
Anion gap: 11 (ref 5–15)
BUN: 8 mg/dL (ref 6–20)
CO2: 23 mmol/L (ref 22–32)
Calcium: 10.1 mg/dL (ref 8.9–10.3)
Chloride: 104 mmol/L (ref 98–111)
Creatinine, Ser: 0.8 mg/dL (ref 0.44–1.00)
GFR calc Af Amer: >60 mL/min (ref >60)
GFR calc non Af Amer: >60 mL/min (ref >60)
Glucose, Bld: 138 mg/dL — ABNORMAL HIGH (ref 70–99)
Potassium: 3.8 mmol/L (ref 3.5–5.1)
Sodium: 138 mmol/L (ref 135–145)
Total Bilirubin: 1.2 mg/dL (ref 0.3–1.2)
Total Protein: 8 g/dL (ref 6.5–8.1)

## 2020-01-02 LAB — URINALYSIS, ROUTINE W REFLEX MICROSCOPIC
Bacteria, UA: NONE SEEN
Bilirubin Urine: NEGATIVE
Glucose, UA: NEGATIVE mg/dL
Ketones, ur: 20 mg/dL — AB
Leukocytes,Ua: NEGATIVE
Nitrite: NEGATIVE
Protein, ur: 30 mg/dL — AB
Specific Gravity, Urine: 1.017 (ref 1.005–1.030)
pH: 7 (ref 5.0–8.0)

## 2020-01-02 LAB — PROTIME-INR
INR: 1.2 (ref 0.8–1.2)
Prothrombin Time: 14.4 seconds (ref 11.4–15.2)

## 2020-01-02 LAB — APTT: aPTT: 33 seconds (ref 24–36)

## 2020-01-02 LAB — LACTIC ACID, PLASMA: Lactic Acid, Venous: 1.4 mmol/L (ref 0.5–1.9)

## 2020-01-02 LAB — HCG, QUANTITATIVE, PREGNANCY: hCG, Beta Chain, Quant, S: <1 m[IU]/mL (ref <5)

## 2020-01-02 LAB — SARS CORONAVIRUS 2 BY RT PCR (HOSPITAL ORDER, PERFORMED IN ~~LOC~~ HOSPITAL LAB): SARS Coronavirus 2: NEGATIVE

## 2020-01-02 MED ORDER — SODIUM CHLORIDE 0.9 % IV BOLUS (SEPSIS)
1000.0000 mL | Freq: Once | INTRAVENOUS | Status: AC
  Administered 2020-01-02: 1000 mL via INTRAVENOUS

## 2020-01-02 MED ORDER — SODIUM CHLORIDE 0.9 % IV SOLN
1.0000 g | Freq: Once | INTRAVENOUS | Status: AC
  Administered 2020-01-02: 1 g via INTRAVENOUS
  Filled 2020-01-02: qty 10

## 2020-01-02 MED ORDER — ACETAMINOPHEN 325 MG PO TABS
650.0000 mg | ORAL_TABLET | Freq: Once | ORAL | Status: AC
  Administered 2020-01-02: 650 mg via ORAL
  Filled 2020-01-02: qty 2

## 2020-01-02 MED ORDER — IOHEXOL 350 MG/ML SOLN
100.0000 mL | Freq: Once | INTRAVENOUS | Status: AC | PRN
  Administered 2020-01-02: 100 mL via INTRAVENOUS

## 2020-01-02 MED ORDER — SODIUM CHLORIDE 0.9 % IV BOLUS (SEPSIS)
800.0000 mL | Freq: Once | INTRAVENOUS | Status: AC
  Administered 2020-01-02: 800 mL via INTRAVENOUS

## 2020-01-02 MED ORDER — SODIUM CHLORIDE 0.9 % IV SOLN
500.0000 mg | Freq: Once | INTRAVENOUS | Status: AC
  Administered 2020-01-03: 500 mg via INTRAVENOUS
  Filled 2020-01-02: qty 500

## 2020-01-02 MED ORDER — SODIUM CHLORIDE (PF) 0.9 % IJ SOLN
INTRAMUSCULAR | Status: AC
  Filled 2020-01-02: qty 50

## 2020-01-02 NOTE — Telephone Encounter (Signed)
Patient called to set up appointment with post covid care center. Patient tested positive for covid in march and recovered. Over the past couple of days she has developed a new onset of symptoms including chest congestion and fever. She is scheduled to get a covid test today. Advised her to continue supportive measures at this time. If her test comes back positive we can get her scheduled for MAB infusion and then she can follow up here at the post covid care clinic afterwards.

## 2020-01-02 NOTE — Progress Notes (Signed)
Communication took place with ED RN, ,Abx's ordered

## 2020-01-02 NOTE — ED Triage Notes (Signed)
Patient c/o fever, headache, generalized body aches, cough, and SOB x 2 days. Patient states she was covid +on 10/07/19 and did not get a negative test until the end of May. Patient states she had a temp 102.4 this AM. patient states she had Tylenol 1100 today.
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
## 2020-01-03 ENCOUNTER — Encounter (HOSPITAL_COMMUNITY): Payer: Self-pay | Admitting: Internal Medicine

## 2020-01-03 DIAGNOSIS — E785 Hyperlipidemia, unspecified: Secondary | ICD-10-CM | POA: Diagnosis present

## 2020-01-03 DIAGNOSIS — D509 Iron deficiency anemia, unspecified: Secondary | ICD-10-CM | POA: Diagnosis present

## 2020-01-03 DIAGNOSIS — Z8371 Family history of colonic polyps: Secondary | ICD-10-CM | POA: Diagnosis not present

## 2020-01-03 DIAGNOSIS — Z8616 Personal history of COVID-19: Secondary | ICD-10-CM | POA: Diagnosis not present

## 2020-01-03 DIAGNOSIS — Z833 Family history of diabetes mellitus: Secondary | ICD-10-CM | POA: Diagnosis not present

## 2020-01-03 DIAGNOSIS — J189 Pneumonia, unspecified organism: Secondary | ICD-10-CM | POA: Diagnosis present

## 2020-01-03 DIAGNOSIS — E119 Type 2 diabetes mellitus without complications: Secondary | ICD-10-CM

## 2020-01-03 DIAGNOSIS — I10 Essential (primary) hypertension: Secondary | ICD-10-CM

## 2020-01-03 DIAGNOSIS — Z8042 Family history of malignant neoplasm of prostate: Secondary | ICD-10-CM | POA: Diagnosis not present

## 2020-01-03 DIAGNOSIS — G4730 Sleep apnea, unspecified: Secondary | ICD-10-CM | POA: Diagnosis present

## 2020-01-03 DIAGNOSIS — G4733 Obstructive sleep apnea (adult) (pediatric): Secondary | ICD-10-CM

## 2020-01-03 DIAGNOSIS — Z6841 Body Mass Index (BMI) 40.0 and over, adult: Secondary | ICD-10-CM | POA: Diagnosis not present

## 2020-01-03 DIAGNOSIS — R000 Tachycardia, unspecified: Secondary | ICD-10-CM | POA: Diagnosis present

## 2020-01-03 DIAGNOSIS — Z794 Long term (current) use of insulin: Secondary | ICD-10-CM | POA: Diagnosis not present

## 2020-01-03 DIAGNOSIS — A419 Sepsis, unspecified organism: Secondary | ICD-10-CM | POA: Diagnosis present

## 2020-01-03 DIAGNOSIS — Z791 Long term (current) use of non-steroidal anti-inflammatories (NSAID): Secondary | ICD-10-CM | POA: Diagnosis not present

## 2020-01-03 DIAGNOSIS — J181 Lobar pneumonia, unspecified organism: Secondary | ICD-10-CM | POA: Diagnosis not present

## 2020-01-03 DIAGNOSIS — Z8701 Personal history of pneumonia (recurrent): Secondary | ICD-10-CM | POA: Diagnosis not present

## 2020-01-03 DIAGNOSIS — J159 Unspecified bacterial pneumonia: Secondary | ICD-10-CM | POA: Diagnosis present

## 2020-01-03 DIAGNOSIS — Z7952 Long term (current) use of systemic steroids: Secondary | ICD-10-CM | POA: Diagnosis not present

## 2020-01-03 DIAGNOSIS — A481 Legionnaires' disease: Secondary | ICD-10-CM | POA: Diagnosis present

## 2020-01-03 DIAGNOSIS — Z8249 Family history of ischemic heart disease and other diseases of the circulatory system: Secondary | ICD-10-CM | POA: Diagnosis not present

## 2020-01-03 DIAGNOSIS — Z79899 Other long term (current) drug therapy: Secondary | ICD-10-CM | POA: Diagnosis not present

## 2020-01-03 DIAGNOSIS — G43909 Migraine, unspecified, not intractable, without status migrainosus: Secondary | ICD-10-CM | POA: Diagnosis present

## 2020-01-03 HISTORY — DX: Legionnaires' disease: A48.1

## 2020-01-03 LAB — CBC
HCT: 30.4 % — ABNORMAL LOW (ref 36.0–46.0)
Hemoglobin: 10 g/dL — ABNORMAL LOW (ref 12.0–15.0)
MCH: 26.2 pg (ref 26.0–34.0)
MCHC: 32.9 g/dL (ref 30.0–36.0)
MCV: 79.6 fL — ABNORMAL LOW (ref 80.0–100.0)
Platelets: 304 10*3/uL (ref 150–400)
RBC: 3.82 MIL/uL — ABNORMAL LOW (ref 3.87–5.11)
RDW: 16.3 % — ABNORMAL HIGH (ref 11.5–15.5)
WBC: 17.5 10*3/uL — ABNORMAL HIGH (ref 4.0–10.5)
nRBC: 0 % (ref 0.0–0.2)

## 2020-01-03 LAB — TSH: TSH: 3.905 u[IU]/mL (ref 0.350–4.500)

## 2020-01-03 LAB — CREATININE, SERUM
Creatinine, Ser: 0.86 mg/dL (ref 0.44–1.00)
GFR calc Af Amer: >60 mL/min (ref >60)
GFR calc non Af Amer: >60 mL/min (ref >60)

## 2020-01-03 LAB — CBG MONITORING, ED
Glucose-Capillary: 132 mg/dL — ABNORMAL HIGH (ref 70–99)
Glucose-Capillary: 156 mg/dL — ABNORMAL HIGH (ref 70–99)
Glucose-Capillary: 161 mg/dL — ABNORMAL HIGH (ref 70–99)
Glucose-Capillary: 171 mg/dL — ABNORMAL HIGH (ref 70–99)

## 2020-01-03 LAB — LACTIC ACID, PLASMA: Lactic Acid, Venous: 1.6 mmol/L (ref 0.5–1.9)

## 2020-01-03 LAB — STREP PNEUMONIAE URINARY ANTIGEN: Strep Pneumo Urinary Antigen: NEGATIVE

## 2020-01-03 MED ORDER — ONDANSETRON HCL 4 MG PO TABS: 4.0000 mg | ORAL_TABLET | Freq: Four times a day (QID) | ORAL | Status: DC | PRN

## 2020-01-03 MED ORDER — SODIUM CHLORIDE 0.9 % IV SOLN: 500.0000 mg | INTRAVENOUS | Status: DC

## 2020-01-03 MED ORDER — INSULIN ASPART 100 UNIT/ML ~~LOC~~ SOLN
0.0000 [IU] | Freq: Three times a day (TID) | SUBCUTANEOUS | Status: DC
  Administered 2020-01-03 (×3): 2 [IU] via SUBCUTANEOUS
  Administered 2020-01-04: 1 [IU] via SUBCUTANEOUS
  Administered 2020-01-04: 2 [IU] via SUBCUTANEOUS
  Administered 2020-01-05: 1 [IU] via SUBCUTANEOUS
  Filled 2020-01-03: qty 0.09

## 2020-01-03 MED ORDER — SODIUM CHLORIDE 0.9 % IV SOLN
2.0000 g | INTRAVENOUS | Status: DC
  Administered 2020-01-03 – 2020-01-04 (×2): 2 g via INTRAVENOUS
  Filled 2020-01-03: qty 20
  Filled 2020-01-03: qty 2
  Filled 2020-01-03: qty 20

## 2020-01-03 MED ORDER — ALBUTEROL SULFATE (2.5 MG/3ML) 0.083% IN NEBU
2.5000 mg | INHALATION_SOLUTION | Freq: Four times a day (QID) | RESPIRATORY_TRACT | Status: DC | PRN
  Administered 2020-01-04: 2.5 mg via RESPIRATORY_TRACT
  Filled 2020-01-03 (×2): qty 3

## 2020-01-03 MED ORDER — IPRATROPIUM-ALBUTEROL 0.5-2.5 (3) MG/3ML IN SOLN
3.0000 mL | Freq: Four times a day (QID) | RESPIRATORY_TRACT | Status: DC
  Administered 2020-01-03 – 2020-01-04 (×3): 3 mL via RESPIRATORY_TRACT
  Filled 2020-01-03 (×3): qty 3

## 2020-01-03 MED ORDER — INSULIN GLARGINE 100 UNIT/ML ~~LOC~~ SOLN
35.0000 [IU] | Freq: Two times a day (BID) | SUBCUTANEOUS | Status: DC
  Administered 2020-01-03 (×2): 35 [IU] via SUBCUTANEOUS
  Filled 2020-01-03 (×2): qty 0.35

## 2020-01-03 MED ORDER — AZITHROMYCIN 250 MG PO TABS
500.0000 mg | ORAL_TABLET | Freq: Every day | ORAL | Status: DC
  Administered 2020-01-03 – 2020-01-05 (×3): 500 mg via ORAL
  Filled 2020-01-03 (×3): qty 2

## 2020-01-03 MED ORDER — IBUPROFEN 200 MG PO TABS
400.0000 mg | ORAL_TABLET | Freq: Four times a day (QID) | ORAL | Status: DC | PRN
  Administered 2020-01-03 – 2020-01-04 (×2): 400 mg via ORAL
  Filled 2020-01-03 (×2): qty 2

## 2020-01-03 MED ORDER — TOPIRAMATE 25 MG PO TABS
50.0000 mg | ORAL_TABLET | Freq: Two times a day (BID) | ORAL | Status: DC
  Administered 2020-01-03 – 2020-01-05 (×6): 50 mg via ORAL
  Filled 2020-01-03 (×6): qty 2

## 2020-01-03 MED ORDER — ACETAMINOPHEN 325 MG PO TABS
650.0000 mg | ORAL_TABLET | Freq: Four times a day (QID) | ORAL | Status: DC | PRN
  Administered 2020-01-03 (×3): 650 mg via ORAL
  Filled 2020-01-03 (×3): qty 2

## 2020-01-03 MED ORDER — VITAMIN B-12 1000 MCG PO TABS
1000.0000 ug | ORAL_TABLET | Freq: Every day | ORAL | Status: DC
  Administered 2020-01-03 – 2020-01-05 (×3): 1000 ug via ORAL
  Filled 2020-01-03 (×3): qty 1

## 2020-01-03 MED ORDER — PANTOPRAZOLE SODIUM 40 MG PO TBEC
40.0000 mg | DELAYED_RELEASE_TABLET | Freq: Every day | ORAL | Status: DC
  Administered 2020-01-03 – 2020-01-05 (×3): 40 mg via ORAL
  Filled 2020-01-03 (×3): qty 1

## 2020-01-03 MED ORDER — ENOXAPARIN SODIUM 80 MG/0.8ML ~~LOC~~ SOLN
80.0000 mg | SUBCUTANEOUS | Status: DC
  Administered 2020-01-03 – 2020-01-04 (×2): 80 mg via SUBCUTANEOUS
  Filled 2020-01-03 (×2): qty 0.8

## 2020-01-03 MED ORDER — INSULIN GLARGINE 100 UNIT/ML SOLOSTAR PEN: 35.0000 [IU] | PEN_INJECTOR | Freq: Two times a day (BID) | SUBCUTANEOUS | Status: DC

## 2020-01-03 MED ORDER — SODIUM CHLORIDE 0.9 % IV SOLN: INTRAVENOUS | Status: DC

## 2020-01-03 MED ORDER — LORATADINE 10 MG PO TABS
10.0000 mg | ORAL_TABLET | Freq: Every day | ORAL | Status: DC
  Administered 2020-01-03 – 2020-01-05 (×3): 10 mg via ORAL
  Filled 2020-01-03 (×3): qty 1

## 2020-01-03 MED ORDER — HYDRALAZINE HCL 20 MG/ML IJ SOLN
10.0000 mg | INTRAMUSCULAR | Status: DC | PRN
  Administered 2020-01-03 (×3): 10 mg via INTRAVENOUS
  Filled 2020-01-03 (×3): qty 1

## 2020-01-03 MED ORDER — INSULIN GLARGINE 100 UNIT/ML ~~LOC~~ SOLN
35.0000 [IU] | Freq: Every day | SUBCUTANEOUS | Status: DC
  Administered 2020-01-04 – 2020-01-05 (×2): 35 [IU] via SUBCUTANEOUS
  Filled 2020-01-03 (×2): qty 0.35

## 2020-01-03 MED ORDER — ONDANSETRON HCL 4 MG/2ML IJ SOLN
4.0000 mg | Freq: Four times a day (QID) | INTRAMUSCULAR | Status: DC | PRN
  Administered 2020-01-03: 4 mg via INTRAVENOUS
  Filled 2020-01-03: qty 2

## 2020-01-03 MED ORDER — LOSARTAN POTASSIUM 50 MG PO TABS
100.0000 mg | ORAL_TABLET | Freq: Every day | ORAL | Status: DC
  Administered 2020-01-03 – 2020-01-05 (×3): 100 mg via ORAL
  Filled 2020-01-03 (×3): qty 2

## 2020-01-03 MED ORDER — ATENOLOL 50 MG PO TABS
50.0000 mg | ORAL_TABLET | Freq: Every day | ORAL | Status: DC
  Administered 2020-01-03 – 2020-01-05 (×3): 50 mg via ORAL
  Filled 2020-01-03 (×3): qty 1

## 2020-01-03 MED ORDER — GUAIFENESIN-DM 100-10 MG/5ML PO SYRP
5.0000 mL | ORAL_SOLUTION | ORAL | Status: DC | PRN
  Administered 2020-01-03 – 2020-01-05 (×5): 5 mL via ORAL
  Filled 2020-01-03 (×6): qty 10

## 2020-01-03 NOTE — ED Notes (Signed)
°   01/03/20 1103  Vitals  BP (!) 175/105  MAP (mmHg) 124  Pulse Rate (!) 119  ECG Heart Rate (!) 119  Resp (!) 26  Oxygen Therapy  SpO2 98 %  MEWS Score  MEWS Temp 2  MEWS Systolic 0  MEWS Pulse 2  MEWS RR 2  MEWS LOC 0  MEWS Score 6  MEWS Score Color Red  Arrien, MD made aware of patient increased HR and BP.  Patient already received PRN hydralazine this morning.  He will follow up.
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
## 2020-01-03 NOTE — Progress Notes (Signed)
PROGRESS NOTE    Emily Phelps  ZTI:[id number] DOB: 01/24/1981 DOA: 01/02/2020 PCP: Hoyt Koch, MD    Brief Narrative:  Patient was admitted to the hospital with working diagnosis of community-acquired pneumonia.  39 year old female who presented with fever, chills and dyspnea.  She does have a past medical history for obesity class III, type 2 diabetes mellitus and hypertension.  She had Covid pneumonia in March 2021.  She reported 2 days of dyspnea, productive cough and fever, associated with generalized body aches and poor appetite. On her initial physical examination blood pressure 174/89, heart rate 133, respiratory rate 30, oxygen saturation 93%, her lungs had no wheezing or rhonchi, heart S1-S2, present tachycardic, soft abdomen, no lower extremity edema. Sodium 138, potassium 3.8, chloride 104, bicarb 23, glucose 138, BUN 8, creatinine 0.8, white count 16.6, hemoglobin 11.1, hematocrit 33.8, platelets 344.  SARS COVID-19 negative.  Urine analysis specific gravity 1.017, 6-10 white cells.  Her chest x-ray had increased hilar vascular margins bilaterally, her CT chest had a left lower lobe infiltrate negative for pulmonary embolism.  CT abdomen negative for acute changes. EKG 134 bpm, normal axis, normal intervals, sinus rhythm, Q wave V1-V2, no ST segment or T wave changes.  Assessment & Plan:   Principal Problem:   CAP (community acquired pneumonia) Active Problems:   Essential hypertension   Obesity, Class III, BMI 40-49.9 (morbid obesity) ([place])   Controlled type 2 diabetes mellitus without complication, with long-term current use of insulin (H[facility])   OSA (obstructive sleep apnea)   1. Left lower lobe bacterial pneumonia, community acquired pneumonia, present on admission. Oxygenation is 96% on room air, patient continue with tachypnea 22 to 29 and tachycardia 106. Worsening leukocytosis at 17,5. Cultures continue with no growth.   Will continue antibiotic therapy  with IV ceftriaxone and oral azithromycin. Will dc IV fluids to follow a restrictive IV fluids strategy. Continue oxymetry monitoring.   Patient with pneumonia in the setting of recent SARS covid 19 viral pneumonia. Will check MRSA screen, if worsening sign of infection may have to escalate antibiotic therapy to cover drug resistant bacteria.  Continue bronchodilator therapy and airway clearing techniques with flutter valve and incentive spirometer.   2. HTN. Will continue blood pressure control with atenolol and losartan. Continue close monitoring.   3. Obesity class 3/ OSA. Patient will need close follow up as outpatient. She is in risk for respiratory complications while hospitalized.   4. Chronic anemia. Stable Hgb and Hct, follow as outpatient.   5. T2DM. Admission glucose is 138, will continue glucose control and monitoring with insulin sliding scale, will decrease basal insulin by 50% to 30 units daily to prevent hypoglycemia.   Patient continue to be at high risk for worsening pneumonia   Status is: Inpatient  Remains inpatient appropriate because:IV treatments appropriate due to intensity of illness or inability to take PO   Dispo: The patient is from: Home              Anticipated d/c is to: Home              Anticipated d/c date is: 3 days              Patient currently is not medically stable to d/c.   DVT prophylaxis: Enoxaparin   Code Status:   full  Family Communication:  I spoke with patient's nice at the bedside, we talked in detail about patient's condition, plan of care and prognosis and all questions were  addressed.    Antimicrobials:   Ceftriaxone   Azithromycin     Subjective: Patient continue to have dyspnea, worse with exertion, no nausea or vomiting, no chest pain.   Objective: Vitals:   01/03/20 1345 01/03/20 1536 01/03/20 1542 01/03/20 1600  BP:  (!) 145/82  136/85  Pulse: (!) 115 (!) 106  (!) 102  Resp: (!) 23 (!) 21  (!) 22  Temp:   99.3 F  (37.4 C)   TempSrc:   Oral   SpO2: 96% 98%  95%  Weight:      Height:        Intake/Output Summary (Last 24 hours) at 01/03/2020 1646 Last data filed at 01/03/2020 1618 Gross per 24 hour  Intake 2972.36 ml  Output 900 ml  Net 2072.36 ml   Filed Weights   01/02/20 1452  Weight: (!) 177.8 kg    Examination:   General: deconditioned and ill looking appearing, positive dyspnea at rest.  Neurology: Awake and alert, non focal  E ENT: mild pallor, no icterus, oral mucosa moist no accessory muscle use.  Cardiovascular: No JVD. S1-S2 present, rhythmic, no gallops, rubs, or murmurs. No lower extremity edema. Pulmonary: positive breath sounds bilaterally anterior ausculation with decreased air entry at dependent zones, more left base. Gastrointestinal. Abdomen protuberant with no organomegaly, non tender, no rebound or guarding Skin. No rashes Musculoskeletal: no joint deformities     Data Reviewed: I have personally reviewed following labs and imaging studies  CBC: Recent Labs  Lab 01/02/20 1928 01/03/20 0357  WBC 16.6* 17.5*  NEUTROABS 12.5*  --   HGB 11.1* 10.0*  HCT 33.8* 30.4*  MCV 79.7* 79.6*  PLT 344 811   Basic Metabolic Panel: Recent Labs  Lab 01/02/20 1928 01/03/20 0357  NA 138  --   K 3.8  --   CL 104  --   CO2 23  --   GLUCOSE 138*  --   BUN 8  --   CREATININE 0.80 0.86  CALCIUM 10.1  --    GFR: Estimated Creatinine Clearance: 148.3 mL/min (by C-G formula based on SCr of 0.86 mg/dL). Liver Function Tests: Recent Labs  Lab 01/02/20 1928  AST 20  ALT 24  ALKPHOS 79  BILITOT 1.2  PROT 8.0  ALBUMIN 3.7   No results for input(s): LIPASE, AMYLASE in the last 168 hours. No results for input(s): AMMONIA in the last 168 hours. Coagulation Profile: Recent Labs  Lab 01/02/20 1928  INR 1.2   Cardiac Enzymes: No results for input(s): CKTOTAL, CKMB, CKMBINDEX, TROPONINI in the last 168 hours. BNP (last 3 results) Recent Labs    03/11/19 1125    PROBNP 18.0   HbA1C: No results for input(s): HGBA1C in the last 72 hours. CBG: Recent Labs  Lab 01/03/20 0755 01/03/20 1219  GLUCAP 156* 161*   Lipid Profile: No results for input(s): CHOL, HDL, LDLCALC, TRIG, CHOLHDL, LDLDIRECT in the last 72 hours. Thyroid Function Tests: Recent Labs    01/03/20 0357  TSH 3.905   Anemia Panel: No results for input(s): VITAMINB12, FOLATE, FERRITIN, TIBC, IRON, RETICCTPCT in the last 72 hours.    Radiology Studies: I have reviewed all of the imaging during this hospital visit personally     Scheduled Meds: . atenolol  50 mg Oral Daily  . enoxaparin (LOVENOX) injection  80 mg Subcutaneous Q24H  . insulin aspart  0-9 Units Subcutaneous TID WC  . insulin glargine  35 Units Subcutaneous BID  . loratadine  10 mg Oral Daily  . losartan  100 mg Oral Daily  . pantoprazole  40 mg Oral Daily  . topiramate  50 mg Oral BID  . vitamin B-12  1,000 mcg Oral Daily   Continuous Infusions: . sodium chloride Stopped (01/03/20 1607)  . azithromycin    . cefTRIAXone (ROCEPHIN)  IV Stopped (01/03/20 1534)     LOS: 0 days        Binnie Droessler Gerome Apley, MD

## 2020-01-03 NOTE — Progress Notes (Signed)
Pt provided flutter valve for use.  Pt demonstrated good technique and understanding.

## 2020-01-04 ENCOUNTER — Ambulatory Visit: Payer: BC Managed Care – PPO

## 2020-01-04 DIAGNOSIS — J181 Lobar pneumonia, unspecified organism: Secondary | ICD-10-CM

## 2020-01-04 DIAGNOSIS — Z6841 Body Mass Index (BMI) 40.0 and over, adult: Secondary | ICD-10-CM

## 2020-01-04 DIAGNOSIS — A419 Sepsis, unspecified organism: Principal | ICD-10-CM

## 2020-01-04 LAB — URINE CULTURE

## 2020-01-04 LAB — BASIC METABOLIC PANEL
Anion gap: 8 (ref 5–15)
BUN: 5 mg/dL — ABNORMAL LOW (ref 6–20)
CO2: 19 mmol/L — ABNORMAL LOW (ref 22–32)
Calcium: 9.2 mg/dL (ref 8.9–10.3)
Chloride: 108 mmol/L (ref 98–111)
Creatinine, Ser: 0.71 mg/dL (ref 0.44–1.00)
GFR calc Af Amer: >60 mL/min (ref >60)
GFR calc non Af Amer: >60 mL/min (ref >60)
Glucose, Bld: 179 mg/dL — ABNORMAL HIGH (ref 70–99)
Potassium: 3.3 mmol/L — ABNORMAL LOW (ref 3.5–5.1)
Sodium: 135 mmol/L (ref 135–145)

## 2020-01-04 LAB — CBC WITH DIFFERENTIAL/PLATELET
Abs Immature Granulocytes: 0.11 10*3/uL — ABNORMAL HIGH (ref 0.00–0.07)
Basophils Absolute: 0 10*3/uL (ref 0.0–0.1)
Basophils Relative: 0 %
Eosinophils Absolute: 0.2 10*3/uL (ref 0.0–0.5)
Eosinophils Relative: 1 %
HCT: 29.8 % — ABNORMAL LOW (ref 36.0–46.0)
Hemoglobin: 9.9 g/dL — ABNORMAL LOW (ref 12.0–15.0)
Immature Granulocytes: 1 %
Lymphocytes Relative: 17 %
Lymphs Abs: 1.9 10*3/uL (ref 0.7–4.0)
MCH: 25.6 pg — ABNORMAL LOW (ref 26.0–34.0)
MCHC: 33.2 g/dL (ref 30.0–36.0)
MCV: 77.2 fL — ABNORMAL LOW (ref 80.0–100.0)
Monocytes Absolute: 0.9 10*3/uL (ref 0.1–1.0)
Monocytes Relative: 8 %
Neutro Abs: 7.8 10*3/uL — ABNORMAL HIGH (ref 1.7–7.7)
Neutrophils Relative %: 73 %
Platelets: 356 10*3/uL (ref 150–400)
RBC: 3.86 MIL/uL — ABNORMAL LOW (ref 3.87–5.11)
RDW: 16.5 % — ABNORMAL HIGH (ref 11.5–15.5)
WBC: 10.8 10*3/uL — ABNORMAL HIGH (ref 4.0–10.5)
nRBC: 0 % (ref 0.0–0.2)

## 2020-01-04 LAB — GLUCOSE, CAPILLARY
Glucose-Capillary: 156 mg/dL — ABNORMAL HIGH (ref 70–99)
Glucose-Capillary: 129 mg/dL — ABNORMAL HIGH (ref 70–99)
Glucose-Capillary: 111 mg/dL — ABNORMAL HIGH (ref 70–99)
Glucose-Capillary: 124 mg/dL — ABNORMAL HIGH (ref 70–99)

## 2020-01-04 MED ORDER — POTASSIUM CHLORIDE CRYS ER 20 MEQ PO TBCR
40.0000 meq | EXTENDED_RELEASE_TABLET | Freq: Once | ORAL | Status: AC
  Administered 2020-01-04: 40 meq via ORAL
  Filled 2020-01-04: qty 2

## 2020-01-04 MED ORDER — IPRATROPIUM-ALBUTEROL 0.5-2.5 (3) MG/3ML IN SOLN
3.0000 mL | Freq: Three times a day (TID) | RESPIRATORY_TRACT | Status: DC
  Administered 2020-01-04 – 2020-01-05 (×2): 3 mL via RESPIRATORY_TRACT
  Filled 2020-01-04 (×3): qty 3

## 2020-01-04 MED ORDER — KETOROLAC TROMETHAMINE 15 MG/ML IJ SOLN
15.0000 mg | Freq: Once | INTRAMUSCULAR | Status: AC
  Administered 2020-01-04: 15 mg via INTRAVENOUS
  Filled 2020-01-04: qty 1

## 2020-01-04 NOTE — Progress Notes (Signed)
PROGRESS NOTE  Cherilynn Schomburg SUP:[id number] DOB: Dec 26, 1980 DOA: 01/02/2020 PCP: Hoyt Koch, MD  Brief History   39 year old woman PMH diabetes mellitus type 2, morbid obesity BMI 68, Covid pneumonia March 2021, presented with cough, fever.  A & P  Sepsis secondary to left lower lobar pneumonia.  SARS-CoV-2 negative. CT chest showed left lower lobe pneumonia typical bacterial/lobar pneumonia.  No PE. --WBC trending down.  No hypoxia.  However still not back to baseline, short of breath. --Continue empiric antibiotics  DM type 2 --CBG stable.  Continue Lantus and sliding scale insulin.  Normocytic anemia.  Stable. --Follow-up as an outpatient  Essential HTN --Stable.  PMH Covid requiring hospitalization 10/2019  Morbid obesity Body mass index is 68.19 kg/m. --dietician consult  Disposition Plan:  Discussion: Still short of breath not back to baseline, continue IV antibiotics.  Monitor clinically.  Treat migraine headache.  If improved, may be able to go home tomorrow.  Status is: Inpatient  Remains inpatient appropriate because:IV treatments appropriate due to intensity of illness or inability to take PO   Dispo: The patient is from: Home              Anticipated d/c is to: Home              Anticipated d/c date is: 1 day              Patient currently is not medically stable to d/c.  DVT prophylaxis:  enoxaparin Code Status: Full Family Communication: brother at bedside  Murray Hodgkins, MD  [facility] Direct contact: see [email or web address] (further directions at bottom of note if needed) 7PM-7AM contact night coverage as at bottom of note 01/04/2020, 4:17 PM  LOS: 1 day   Significant Diagnostic Tests:  . CTA no PE. Left lower lobe pneumonia   Micro Data:  . SARS-CoV-2 negative   Antimicrobials:  . Azithromycin 6/21 >  . Ceftriaxone 6/21 >   Interval History/Subjective  Not back to baseline.  Still short of breath.  Was able to sit up in the  chair for a little bit earlier by got dizzy.  Reports migraine headache.  Usually takes Aleve if needed but has been unrelieved here, sometimes needs Toradol.  Objective   Vitals:  Vitals:   01/04/20 1201 01/04/20 1230  BP:  (!) 149/96  Pulse:  100  Resp:  20  Temp:  99.7 F (37.6 C)  SpO2: 98% 99%    Exam:  Constitutional.  Appears calm, mildly uncomfortable, nontoxic. Respiratory.  Clear to auscultation bilaterally though breath sounds are diminished.  No wheezes, rales or rhonchi.  Mild increased respiratory effort. Cardiovascular.  Regular rate and rhythm.  No murmur, rub or gallop.  No lower extremity edema. Psychiatric.  Grossly normal mood and affect.  Speech fluent and appropriate.  I have personally reviewed the following:   Today's Data  . Potassium 3.3, remainder BMP unremarkable . Hemoglobin stable at 9.9.  WBC down to 10.8.  Scheduled Meds: . atenolol  50 mg Oral Daily  . azithromycin  500 mg Oral Daily  . enoxaparin (LOVENOX) injection  80 mg Subcutaneous Q24H  . insulin aspart  0-9 Units Subcutaneous TID WC  . insulin glargine  35 Units Subcutaneous Daily  . ipratropium-albuterol  3 mL Nebulization TID  . ketorolac  15 mg Intravenous Once  . loratadine  10 mg Oral Daily  . losartan  100 mg Oral Daily  . pantoprazole  40 mg Oral Daily  . topiramate  50 mg Oral BID  . vitamin B-12  1,000 mcg Oral Daily   Continuous Infusions: . cefTRIAXone (ROCEPHIN)  IV 2 g (01/04/20 1348)    Principal Problem:   Lobar pneumonia ([place]) Active Problems:   Essential hypertension   Controlled type 2 diabetes mellitus without complication, with long-term current use of insulin (HCC)   OSA (obstructive sleep apnea)   CAP (community acquired pneumonia)   Sepsis ([place])   Morbid obesity with BMI of 60.0-69.9, adult ([place])   LOS: 1 day   How to contact the [facility] Attending or Consulting provider 7A - 7P or covering provider during after hours [place], for this patient?   1. Check the care team in [facility] and look for a) attending/consulting TRH provider listed and b) the [facility] team listed 2. Log into [email or web address] and use [place]'s universal password to access. If you do not have the password, please contact the hospital operator. 3. Locate the [facility] provider you are looking for under [facility] and page to a number that you can be directly reached. 4. If you still have difficulty reaching the provider, please page the [facility] (Director on Call) for the Hospitalists listed on amion for assistance.

## 2020-01-04 NOTE — Plan of Care (Signed)
  Problem: Activity: Goal: Risk for activity intolerance will decrease Outcome: Progressing   Problem: Nutrition: Goal: Adequate nutrition will be maintained Outcome: Progressing   Problem: Pain Managment: Goal: General experience of comfort will improve Outcome: Progressing   

## 2020-01-05 LAB — GLUCOSE, CAPILLARY
Glucose-Capillary: 138 mg/dL — ABNORMAL HIGH (ref 70–99)
Glucose-Capillary: 144 mg/dL — ABNORMAL HIGH (ref 70–99)

## 2020-01-05 MED ORDER — ENSURE MAX PROTEIN PO LIQD
11.0000 [oz_av] | Freq: Every day | ORAL | Status: DC
  Filled 2020-01-05: qty 330

## 2020-01-05 MED ORDER — ADULT MULTIVITAMIN W/MINERALS CH
1.0000 | ORAL_TABLET | Freq: Every day | ORAL | Status: DC
  Administered 2020-01-05: 1 via ORAL
  Filled 2020-01-05: qty 1

## 2020-01-05 MED ORDER — PRO-STAT SUGAR FREE PO LIQD: 30.0000 mL | Freq: Every day | ORAL | Status: DC

## 2020-01-05 MED ORDER — CEFUROXIME AXETIL 500 MG PO TABS
500.0000 mg | ORAL_TABLET | Freq: Two times a day (BID) | ORAL | 0 refills | Status: AC
Start: 2020-01-05 — End: 2020-01-09

## 2020-01-05 MED ORDER — FLUTICASONE PROPIONATE 50 MCG/ACT NA SUSP
2.0000 | Freq: Every day | NASAL | Status: DC | PRN
Start: 2020-01-05 — End: 2021-07-18

## 2020-01-05 MED ORDER — KATE FARMS STANDARD 1.4 PO LIQD
325.0000 mL | Freq: Every day | ORAL | Status: DC
  Filled 2020-01-05: qty 325

## 2020-01-05 MED ORDER — AZITHROMYCIN 500 MG PO TABS: 500.0000 mg | ORAL_TABLET | Freq: Every day | ORAL | 0 refills | Status: AC

## 2020-01-05 NOTE — Discharge Summary (Signed)
Physician Discharge Summary  Emily Phelps NFA:[id number] DOB: Dec 09, 1980 DOA: 01/02/2020  PCP: Hoyt Koch, MD  Admit date: 01/02/2020 Discharge date: 01/05/2020  Recommendations for Outpatient Follow-up:  Chronic microcytic anemia   Follow-up Information    Hoyt Koch, MD. Schedule an appointment as soon as possible for a visit in 2 week(s).   Specialty: Internal Medicine Contact information: [place] [address] [phone]                Discharge Diagnoses: Principal diagnosis is #1 1. Sepsis secondary to left lower lobar pneumonia 2. DM type 2 3. Chronic microcytic anemia 4. Essential HTN 5. Morbid obesity Body mass index is 68.19 kg/m.  Discharge Condition: improved Disposition: home  Diet recommendation: heart healthy, diabetic diet  Filed Weights   01/02/20 1452 01/04/20 0345  Weight: (!) 177.8 kg (!) 180.2 kg    History of present illness:  39 year old woman PMH diabetes mellitus type 2, morbid obesity BMI 68, Covid pneumonia March 2021, presented with cough, fever.  Hospital Course:  Patient was treated with empiric antibiotics with gradual clinical improvement.  Hospitalization was uncomplicated.  Individual issues as below.  Sepsis secondary to left lower lobar pneumonia.  SARS-CoV-2 negative. CT chest showed left lower lobe pneumonia typical bacterial/lobar pneumonia.  No PE. --WBC trended down.  No hypoxia.  Gradually improving.  Complete antibiotics as an outpatient.  DM type 2 --CBG remained stable.  Continue Lantus and sliding scale insulin.  Normocytic anemia.  Stable. --Follow-up as an outpatient  Essential HTN --Stable.  Morbid obesity Body mass index is 68.19 kg/m. --dietician consult appreciated.  Significant Diagnostic Tests:   CTA no PE. Left lower lobe pneumonia  Micro Data:   SARS-CoV-2 negative  Antimicrobials:   Azithromycin 6/21 > 6/25  Ceftriaxone 6/21 >   6/23  Ceftin 6/24 > 6/27  Today's assessment: S: Overall feels better today.  Still somewhat short of breath but ambulating to bathroom.  Coughing.  Headache better. O: Vitals:  Vitals:   01/05/20 0539 01/05/20 0718  BP: (!) 148/97   Pulse: 97   Resp: 18   Temp: 97.9 F (36.6 C)   SpO2: 98% 97%    Constitutional:  . Appears calm and comfortable Respiratory:  . CTA bilaterally . Respiratory effort normal. Cardiovascular:  . RRR, no m/r/g Psychiatric:  . Mental status o Mood, affect appropriate  CBG stable  Discharge Instructions  Discharge Instructions    Diet - low sodium heart healthy   Complete by: As directed    Diet Carb Modified   Complete by: As directed    Discharge instructions   Complete by: As directed    Call your physician or seek immediate medical attention for fever, shortness of breath or worsening of condition.   Increase activity slowly   Complete by: As directed      Allergies as of 01/05/2020   No Known Allergies     Medication List    STOP taking these medications   predniSONE 20 MG tablet Commonly known as: DELTASONE   promethazine-codeine 6.25-10 MG/5ML syrup Commonly known as: PHENERGAN with CODEINE     TAKE these medications   albuterol 108 (90 Base) MCG/ACT inhaler Commonly known as: VENTOLIN HFA Inhale 2 puffs into the lungs every 6 (six) hours as needed for wheezing or shortness of breath.   atenolol 50 MG tablet Commonly known as: TENORMIN Take 1 tablet (50 mg total) by mouth daily. Follow=up appt is due must see  provider for future refills What changed: additional instructions   azithromycin 500 MG tablet Commonly known as: ZITHROMAX Take 1 tablet (500 mg total) by mouth daily for 2 days. Start 6/25 AM Start taking on: January 06, 2020   cefUROXime 500 MG tablet Commonly known as: CEFTIN Take 1 tablet (500 mg total) by mouth 2 (two) times daily for 4 days.   Contour Next Test test strip Generic drug: glucose  blood Use to check blood sugar level three times daily as directed   Coricidin D Cold/Flu/Sinus 2-5-325 MG Tabs Generic drug: Chlorphen-Phenyleph-APAP Take 2 tablets by mouth at bedtime as needed (cold/flu symptoms).   fexofenadine 180 MG tablet Commonly known as: ALLEGRA TAKE 1 TABLET BY MOUTH EVERY DAY   fluticasone 50 MCG/ACT nasal spray Commonly known as: FLONASE Place 2 sprays into both nostrils daily as needed for allergies.   glimepiride 1 MG tablet Commonly known as: AMARYL TAKE 1 TABLET BY MOUTH EVERY DAY WITH breakfast What changed: See the new instructions.   guaiFENesin-dextromethorphan 100-10 MG/5ML syrup Commonly known as: ROBITUSSIN DM Take 5 mLs by mouth every 8 (eight) hours as needed for cough.   hydrochlorothiazide 25 MG tablet Commonly known as: HYDRODIURIL TAKE 1 TABLET BY MOUTH EVERY DAY   ibuprofen 200 MG tablet Commonly known as: ADVIL Take 200 mg by mouth every 6 (six) hours as needed for moderate pain.   Lantus SoloStar 100 UNIT/ML Solostar Pen Generic drug: insulin glargine INJECT 35 UNITS INTO THE SKIN 2 TIMES DAILY What changed: See the new instructions.   losartan 100 MG tablet Commonly known as: COZAAR TAKE 1 TABLET BY MOUTH EVERY DAY   Microlet Lancets Misc CHECK BLOOD SUGAR LEVEL THREE TIMES DAILY AS DIRECTED   multivitamin with minerals Tabs tablet Take 1 tablet by mouth daily.   omeprazole 40 MG capsule Commonly known as: PRILOSEC Take 1 capsule (40 mg total) by mouth 2 (two) times daily.   Pen Needles 31G X 8 MM Misc Use twice a day to inject insulin as instructed.   Previfem 0.25-35 MG-MCG tablet Generic drug: norgestimate-ethinyl estradiol TAKE 1 TABLET BY MOUTH EVERY DAY   topiramate 50 MG tablet Commonly known as: TOPAMAX TAKE 1 TABLET BY MOUTH 2 TIMES DAILY   Vitamin B12 1000 MCG Tbcr Take 1,000 mcg by mouth daily.   VITAMIN D PO Take 1 tablet by mouth daily.      No Known Allergies  The results of  significant diagnostics from this hospitalization (including imaging, microbiology, ancillary and laboratory) are listed below for reference.    Significant Diagnostic Studies: DG Chest 2 View  Result Date: 01/02/2020 CLINICAL DATA:  39 year old female with shortness of breath and tachycardia. EXAM: CHEST - 2 VIEW COMPARISON:  Chest radiograph dated 11/04/2019. FINDINGS: Evaluation is limited due to body habitus. No focal consolidation, pleural effusion or pneumothorax. Stable borderline cardiomegaly. No acute osseous pathology. IMPRESSION: No active cardiopulmonary disease. Electronically Signed   By: Anner Crete M.D.   On: 01/02/2020 16:47   CT Angio Chest PE W/Cm &/Or Wo Cm  Result Date: 01/02/2020 CLINICAL DATA:  Shortness of breath. Cough and fever. Patient reports COVID positive nearly 3 months ago. EXAM: CT ANGIOGRAPHY CHEST WITH CONTRAST TECHNIQUE: Multidetector CT imaging of the chest was performed using the Phelps protocol during bolus administration of intravenous contrast. Multiplanar CT image reconstructions and MIPs were obtained to evaluate the vascular anatomy. Performed in conjunction with CT of the abdomen/pelvis, reported separately. CONTRAST:  156mL OMNIPAQUE IOHEXOL 350 MG/ML  SOLN COMPARISON:  Radiograph earlier this day. FINDINGS: Cardiovascular: Evaluation is technically limited due to contrast bolus timing and soft tissue attenuation from habitus. There are no filling defects in the main or lobar pulmonary arteries. Cannot assess more distal branches. Thoracic aorta is normal in caliber. Heart is normal in size. No pericardial effusion. Mediastinum/Nodes: Suspected small left hilar nodes. No mediastinal adenopathy. Visualized thyroid gland is normal. Patulous esophagus without wall thickening. Lungs/Pleura: Confluent ground-glass opacity involving the superior segment and posterior basal segment of the left lower lobe. There are central air bronchograms. Right lung is clear  allowing for motion artifact. No pleural fluid. Upper Abdomen: Assessed on concurrent abdominal CT, reported separately. Musculoskeletal: There are no acute or suspicious osseous abnormalities. Degenerative change in the spine. Review of the MIP images confirms the above findings. IMPRESSION: 1. Left lower lobe pneumonia, typical of bacterial/lobar pneumonia. 2. No central pulmonary embolus, evaluation is significantly limited due to contrast bolus timing and soft tissue attenuation from habitus. Cannot assess distal to the lobar pulmonary arteries. 3. Suspected small left hilar nodes, likely reactive. Electronically Signed   By: Keith Rake M.D.   On: 01/02/2020 23:32   CT Abdomen Pelvis W Contrast  Result Date: 01/02/2020 CLINICAL DATA:  Abdominal abscess/infection suspected Fever, cough and shortness of breath. EXAM: CT ABDOMEN AND PELVIS WITH CONTRAST TECHNIQUE: Multidetector CT imaging of the abdomen and pelvis was performed using the Phelps protocol following bolus administration of intravenous contrast. CONTRAST:  177mL OMNIPAQUE IOHEXOL 350 MG/ML SOLN COMPARISON:  None. FINDINGS: Lower chest: Assessed on concurrent chest CT reported separately. Left lower lobe pneumonia. Hepatobiliary: Enlarged liver spanning 22 cm in cranial caudal dimension. No focal abnormality. Gallbladder physiologically distended, no calcified stone. No biliary dilatation. Pancreas: No ductal dilatation or inflammation. Spleen: Upper normal in size spanning 13 cm cranial caudal. Adrenals/Urinary Tract: Normal adrenal glands. No hydronephrosis or perinephric edema. Homogeneous renal enhancement with symmetric excretion on delayed phase imaging. Urinary bladder is nondistended and not well assessed. Stomach/Bowel: Stomach is within normal limits. Appendix appears normal. No evidence of bowel wall thickening, distention, or inflammatory changes. Moderate stool burden. Vascular/Lymphatic: No significant vascular findings are  present. No enlarged abdominal or pelvic lymph nodes. Reproductive: Uterus and bilateral adnexa are unremarkable. Other: Small fat containing umbilical hernia. Fat within both inguinal canals. No free air, free fluid, or intra-abdominal fluid collection. Musculoskeletal: There are no acute or suspicious osseous abnormalities. IMPRESSION: 1. No acute abnormality in the abdomen/pelvis. 2. Left lower lobe pneumonia. 3. Hepatomegaly. 4. Fat containing umbilical and inguinal hernias. Electronically Signed   By: Keith Rake M.D.   On: 01/02/2020 23:39    Microbiology: Recent Results (from the past 240 hour(s))  SARS Coronavirus 2 by RT PCR (hospital order, performed in [facility] hospital lab) Nasopharyngeal     Status: None   Collection Time: 01/02/20  6:34 PM   Specimen: Nasopharyngeal  Result Value Ref Range Status   SARS Coronavirus 2 NEGATIVE NEGATIVE Final    Comment: (NOTE) SARS-CoV-2 target nucleic acids are NOT DETECTED.  The SARS-CoV-2 RNA is generally detectable in upper and lower respiratory specimens during the acute phase of infection. The lowest concentration of SARS-CoV-2 viral copies this assay can detect is 250 copies / mL. A negative result does not preclude SARS-CoV-2 infection and should not be used as the sole basis for treatment or other patient management decisions.  A negative result may occur with improper specimen collection / handling, submission of specimen other than nasopharyngeal swab,  presence of viral mutation(s) within the areas targeted by this assay, and inadequate number of viral copies (<250 copies / mL). A negative result must be combined with clinical observations, patient history, and epidemiological information.  Fact Sheet for Patients:   StrictlyIdeas.no  Fact Sheet for Healthcare Providers: BankingDealers.co.za  This test is not yet approved or  cleared by the [place] FDA and has been  authorized for detection and/or diagnosis of SARS-CoV-2 by FDA under an Emergency Use Authorization (EUA).  This EUA will remain in effect (meaning this test can be used) for the duration of the COVID-19 declaration under Section 564(b)(1) of the Act, 21 U.S.C. section 360bbb-3(b)(1), unless the authorization is terminated or revoked sooner.  Performed at [facility], [place] [address], [place], [place] [address]   Blood Culture (routine x 2)     Status: None (Preliminary result)   Collection Time: 01/02/20  7:30 PM   Specimen: BLOOD  Result Value Ref Range Status   Specimen Description   Final    BLOOD BLOOD LEFT FOREARM Performed at [facility] [place] [address], [place], [place] [address]    Special Requests   Final    BOTTLES DRAWN AEROBIC ONLY Blood Culture adequate volume Performed at [facility] [address] [place], [place] [address]    Culture   Final    NO GROWTH 3 DAYS Performed at [facility], [place] [address] [place], [place] [address]    Report Status PENDING  Incomplete  Blood Culture (routine x 2)     Status: None (Preliminary result)   Collection Time: 01/02/20  7:30 PM   Specimen: BLOOD  Result Value Ref Range Status   Specimen Description   Final    BLOOD BLOOD RIGHT FOREARM Performed at [place] [address], [place], [place] [address]    Special Requests   Final    BOTTLES DRAWN AEROBIC AND ANAEROBIC Blood Culture adequate volume Performed at [place] [address], [place], [place] [address]    Culture   Final    NO GROWTH 3 DAYS Performed at [facility], [place] [address] [place], [place] [address]    Report Status PENDING  Incomplete  Urine culture     Status: Abnormal   Collection Time: 01/02/20 11:00 PM   Specimen: In/Out Cath Urine  Result Value Ref Range Status   Specimen Description   Final    IN/OUT CATH  URINE Performed at [facility] [address] [place], [place] [address]    Special Requests   Final    NONE Performed at [facility], [place] [address] [place],  [address]    Culture MULTIPLE SPECIES PRESENT, SUGGEST RECOLLECTION (A)  Final   Report Status 01/04/2020 FINAL  Final     Labs: Basic Metabolic Panel: Recent Labs  Lab 01/02/20 1928 01/03/20 0357 01/04/20 0431  NA 138  --  135  K 3.8  --  3.3*  CL 104  --  108  CO2 23  --  19*  GLUCOSE 138*  --  179*  BUN 8  --  5*  CREATININE 0.80 0.86 0.71  CALCIUM 10.1  --  9.2   Liver Function Tests: Recent Labs  Lab 01/02/20 1928  AST 20  ALT 24  ALKPHOS 79  BILITOT 1.2  PROT 8.0  ALBUMIN 3.7   CBC: Recent Labs  Lab 01/02/20 1928  01/03/20 0357 01/04/20 0431  WBC 16.6* 17.5* 10.8*  NEUTROABS 12.5*  --  7.8*  HGB 11.1* 10.0* 9.9*  HCT 33.8* 30.4* 29.8*  MCV 79.7* 79.6* 77.2*  PLT 344 304 356   CBG: Recent Labs  Lab 01/04/20 1354 01/04/20 1645 01/04/20 2031 01/05/20 0708 01/05/20 1123  GLUCAP 129* 111* 124* 138* 144*    Principal Problem:   Lobar pneumonia (HCC) Active Problems:   Essential hypertension   Controlled type 2 diabetes mellitus without complication, with long-term current use of insulin (HCC)   OSA (obstructive sleep apnea)   CAP (community acquired pneumonia)   Sepsis ([place])   Morbid obesity with BMI of 60.0-69.9, adult [facility])   Time coordinating discharge: 35 minutes  Signed:  Murray Hodgkins, MD  [facility]  01/05/2020, 5:09 PM

## 2020-01-05 NOTE — Progress Notes (Signed)
Initial Nutrition Assessment  DOCUMENTATION CODES:   Morbid obesity  INTERVENTION:  - will order Ensure Max once/day, each supplement provides 150 kcal and 30 grams protein. - will order [employer] once/day, each supplement provides 455 kcal and 20 grams protein. - will order 30 mL Prostat once/day, each supplement provides 100 kcal and 15 grams of protein. - will order 1 tablet multivitamin with minerals.   NUTRITION DIAGNOSIS:   Increased nutrient needs related to acute illness (sepsis) as evidenced by estimated needs.  GOAL:   Patient will meet greater than or equal to 90% of their needs  MONITOR:   PO intake, Supplement acceptance, Labs, Weight trends  REASON FOR ASSESSMENT:   Consult Assessment of nutrition requirement/status, Diet education  ASSESSMENT:   39 y.o. female with medical history of morbid obesity, type 2 DM, and HTN. She was admitted in 09/2019 due to COVID PNA. She presented to the ED on 6/22 due to 2 day hx of fever, chills, productive cough, and poor appetite. CT angio chest and abdomen in the ED showed LLL PNA. Diagnosed with sepsis.  Patient has had a decreased appetite for the past ~1 week. No abdominal pain/pressure with intakes and no nausea or feeling like she is going to vomit at any point. She was able to eat ~50% of all meals yesterday (the only documented intake is 50% of lunch yesterday). No taste alterations.  Per chart review, weight on 6/21 was 392 lb and weight yesterday was 397 lb. Noted edema during NFPE. Weight has been fairly stable over the past 7 months.   Consult received for diet education related to obesity/weight loss education. Not appropriate in the setting of decreased appetite for patient with increased needs at this time d/t sepsis. If diet education is desired, recommend referral for outpatient education.   Plan for home at time of d/c, as early as today or tomorrow, per notes.   Labs reviewed; CBGs: 156, 129, 111, 124, 138  mg/dl, K: 3.3 mmol/l, BUN: 5 mg/dl. Medications reviewed; sliding scale novolog, 35 units lantus/day, 40 mg oral protonix/day, 1000 mcg oral cyanocobalamin/day.     NUTRITION - FOCUSED PHYSICAL EXAM:  completed; no muscle or fat wasting, mild edema to all extremities.   Diet Order:   Diet Order            Diet heart healthy/carb modified Room service appropriate? Yes; Fluid consistency: Thin  Diet effective now                 EDUCATION NEEDS:   Not appropriate for education at this time  Skin:  Skin Assessment: Reviewed RN Assessment  Last BM:  6/23  Height:   Ht Readings from Last 1 Encounters:  01/04/20 5\' 4"  (1.626 m)    Weight:   Wt Readings from Last 1 Encounters:  01/04/20 (!) 180.2 kg     Estimated Nutritional Needs:  Kcal:  2340-2550 kcal Protein:  120-130 grams Fluid:  >/= 2.2 L/day     Jarome Matin, MS, RD, LDN, CNSC Inpatient Clinical Dietitian RD pager # available in AMION  After hours/weekend pager # available in [facility]

## 2020-01-05 NOTE — Plan of Care (Signed)
  Problem: Activity: Goal: Risk for activity intolerance will decrease Outcome: Adequate for Discharge   Problem: Nutrition: Goal: Adequate nutrition will be maintained Outcome: Adequate for Discharge   Problem: Pain Managment: Goal: General experience of comfort will improve Outcome: Adequate for Discharge   Problem: Activity: Goal: Risk for activity intolerance will decrease Outcome: Adequate for Discharge   Problem: Nutrition: Goal: Adequate nutrition will be maintained Outcome: Adequate for Discharge   Problem: Pain Managment: Goal: General experience of comfort will improve Outcome: Adequate for Discharge

## 2020-01-06 ENCOUNTER — Encounter: Payer: Self-pay | Admitting: Internal Medicine

## 2020-01-06 ENCOUNTER — Telehealth (INDEPENDENT_AMBULATORY_CARE_PROVIDER_SITE_OTHER): Payer: BC Managed Care – PPO | Admitting: Internal Medicine

## 2020-01-06 DIAGNOSIS — E119 Type 2 diabetes mellitus without complications: Secondary | ICD-10-CM | POA: Diagnosis not present

## 2020-01-06 DIAGNOSIS — J181 Lobar pneumonia, unspecified organism: Secondary | ICD-10-CM | POA: Diagnosis not present

## 2020-01-06 DIAGNOSIS — A481 Legionnaires' disease: Secondary | ICD-10-CM

## 2020-01-06 DIAGNOSIS — G43809 Other migraine, not intractable, without status migrainosus: Secondary | ICD-10-CM | POA: Diagnosis not present

## 2020-01-06 DIAGNOSIS — Z794 Long term (current) use of insulin: Secondary | ICD-10-CM

## 2020-01-06 LAB — LEGIONELLA PNEUMOPHILA SEROGP 1 UR AG: L. pneumophila Serogp 1 Ur Ag: POSITIVE — AB

## 2020-01-06 MED ORDER — SUMATRIPTAN SUCCINATE 50 MG PO TABS
50.0000 mg | ORAL_TABLET | ORAL | 0 refills | Status: DC | PRN
Start: 2020-01-06 — End: 2020-05-15

## 2020-01-06 MED ORDER — PROMETHAZINE-DM 6.25-15 MG/5ML PO SYRP
5.0000 mL | ORAL_SOLUTION | Freq: Four times a day (QID) | ORAL | 0 refills | Status: DC | PRN
Start: 2020-01-06 — End: 2020-05-30

## 2020-01-06 NOTE — Assessment & Plan Note (Signed)
She is on appropriate antibiotics and talked with her and brother during visit that this is likely cause of the CAP. She will finish cefuroxime and azithromycin. She does appear to be improving but given recent bout with Covid-19 she may have extended recovery period. She is given new work note for additional 2 weeks off. She is currently still having SOB and severe cough. Rx promethazine/dm for cough. Advised to use albuterol prn for SOB and she can try scheduling 1-2 times per day to see if this helps. If not use prn only.

## 2020-01-06 NOTE — Progress Notes (Signed)
Virtual Visit via Video Note  I connected with Emily Phelps on 01/06/20 at  1:20 PM EDT by a video enabled telemedicine application and verified that I am speaking with the correct person using two identifiers.  The patient and the provider were at separate locations throughout the entire encounter. Patient location: home, Provider location: work   I discussed the limitations of evaluation and management by telemedicine and the availability of in person appointments. The patient expressed understanding and agreed to proceed. The patient and the provider were the only parties present for the visit unless noted in HPI below.  History of Present Illness: The patient is a 39 y.o. female with visit for follow up hospital (in [place], treated with azithromycin and sent home with azithromycin and cefuroxime). She did recently have bout with covid-19 (required hospitalization and required extended time for recovery). She is taking the antibiotics and left hospital yesterday. She is concerned as the hospital doctors said she should go back to work Monday and she is not feeling improved. She was getting breathing treatments in the hospital which she has not had at home. She was not sure if she should do them on a schedule. Still having SOB and fatigue and excessive cough. She is taking chlorocedin which is not very effective. Denies current fevers or chills. Overall it is better than before she started antibiotics but not improving quickly. She is having headaches and is not sure the cause. Sugars are mildly higher than average with fasting this morning 120. She is also having some dizziness which she thinks is from coughing so much. She is not able to sleep lying flat and is sleeping on cough upright again. She previously had to do this after covid-19 for some time. Talked with her brother also during visit as he has several specific concerns about her health and lung function and why she got another pneumonia so close  to the covid-19.   PMH, [place], social history reviewed and updated  Observations/Objective: Appearance: normal, breathing appears normal, coughing during visit, no dyspnea with stationary but with movement gets SOB, casual grooming, abdomen does not appear distended, throat not well visualized, A and O times 3  113/82 BP today  Assessment and Plan: See problem oriented charting  Follow Up Instructions: finish antibiotics, rx promethazine/dm cough syrup for cough and imitrex for headaches  I discussed the assessment and treatment plan with the patient. The patient was provided an opportunity to ask questions and all were answered. The patient agreed with the plan and demonstrated an understanding of the instructions.   The patient was advised to call back or seek an in-person evaluation if the symptoms worsen or if the condition fails to improve as anticipated.  Hoyt Koch, MD

## 2020-01-06 NOTE — Assessment & Plan Note (Signed)
Sugars are higher than her normal and we talked about how this can be normal in infection with diabetes. Monitor closely and let us know if elevating further or if they fail to return to normal after improvement clinically.

## 2020-01-06 NOTE — Assessment & Plan Note (Signed)
Likely triggered by coughing quite often. Not controlled by tylenol or ibuprofen. Rx imitrex to use for migraines.

## 2020-01-06 NOTE — Assessment & Plan Note (Signed)
Related to legionella which resulted after leaving hospital. She is under appropriate treatment. Patient's brother with several questions about why she got this pneumonia so close to covid-19 pneumonia and we discussed that this may be unknown but she could have some scarring or changes in the lungs which made her more susceptible. She would like to get pneumonia 23 once feeling improved and I agree with that.

## 2020-01-07 LAB — CULTURE, BLOOD (ROUTINE X 2)
Culture: NO GROWTH
Culture: NO GROWTH
Special Requests: ADEQUATE
Special Requests: ADEQUATE

## 2020-01-09 ENCOUNTER — Encounter: Payer: Self-pay | Admitting: Internal Medicine

## 2020-01-10 NOTE — Telephone Encounter (Signed)
Forms have been faxed to [employer], Copy sent to scan, Original mailed to patient.

## 2020-01-16 ENCOUNTER — Other Ambulatory Visit: Payer: Self-pay | Admitting: Internal Medicine

## 2020-01-20 ENCOUNTER — Other Ambulatory Visit: Payer: Self-pay

## 2020-01-20 ENCOUNTER — Ambulatory Visit (INDEPENDENT_AMBULATORY_CARE_PROVIDER_SITE_OTHER): Payer: BC Managed Care – PPO | Admitting: Internal Medicine

## 2020-01-20 ENCOUNTER — Encounter: Payer: Self-pay | Admitting: Internal Medicine

## 2020-01-20 VITALS — BP 128/86 | HR 81 | Temp 98.2°F | Ht 64.0 in | Wt 397.0 lb

## 2020-01-20 DIAGNOSIS — Z23 Encounter for immunization: Secondary | ICD-10-CM

## 2020-01-20 DIAGNOSIS — E119 Type 2 diabetes mellitus without complications: Secondary | ICD-10-CM

## 2020-01-20 DIAGNOSIS — G43809 Other migraine, not intractable, without status migrainosus: Secondary | ICD-10-CM | POA: Diagnosis not present

## 2020-01-20 DIAGNOSIS — Z794 Long term (current) use of insulin: Secondary | ICD-10-CM

## 2020-01-20 DIAGNOSIS — Z713 Dietary counseling and surveillance: Secondary | ICD-10-CM | POA: Diagnosis not present

## 2020-01-20 DIAGNOSIS — A481 Legionnaires' disease: Secondary | ICD-10-CM | POA: Diagnosis not present

## 2020-01-20 NOTE — Patient Instructions (Signed)
We have given you the return to work form to go back Monday.  We have given you the pneumonia shot today.  You can stop taking the mucinex to see if you do okay without it.

## 2020-01-20 NOTE — Progress Notes (Signed)
° °  Subjective:   Patient ID: Emily Phelps, female    DOB: 1980-09-09, 39 y.o.   MRN: [id number]  HPI The patient is a 39 YO female coming in for follow up of her lobar pneumonia (legionella, treated appropriately, is recovering, still appetite poor and energy not back to normal, SOB is less, coughing some but less). She is concerned about her sugars (tried to switch to vegan cleanse recently and this was causing low sugar readings, she is now back on her more normal diet and doing better, denies low sugars currently, taking amaryl and lantus, denies new numbness or tingling in hands or feet) and migraines (not having headaches anymore, had some as she was coughing often with the pneumonia for several weeks). Does feel ready to go back to work Monday and needs note to return to work filled out today for her job.   Review of Systems  Constitutional: Positive for activity change, appetite change and fatigue. Negative for chills and diaphoresis.  HENT: Negative.   Eyes: Negative.   Respiratory: Positive for cough. Negative for chest tightness and shortness of breath.   Cardiovascular: Negative for chest pain, palpitations and leg swelling.  Gastrointestinal: Negative for abdominal distention, abdominal pain, constipation, diarrhea, nausea and vomiting.  Musculoskeletal: Negative.   Skin: Negative.   Neurological: Negative.   Psychiatric/Behavioral: Negative.     Objective:  Physical Exam Constitutional:      Appearance: She is well-developed. She is obese.  HENT:     Head: Normocephalic and atraumatic.  Cardiovascular:     Rate and Rhythm: Normal rate and regular rhythm.  Pulmonary:     Effort: Pulmonary effort is normal. No respiratory distress.     Breath sounds: Normal breath sounds. No wheezing or rales.  Abdominal:     General: Bowel sounds are normal. There is no distension.     Palpations: Abdomen is soft.     Tenderness: There is no abdominal tenderness. There is no rebound.    Musculoskeletal:     Cervical back: Normal range of motion.  Skin:    General: Skin is warm and dry.  Neurological:     Mental Status: She is alert and oriented to person, place, and time.     Coordination: Coordination normal.     Vitals:   01/20/20 1017  BP: 128/86  Pulse: 81  Temp: 98.2 F (36.8 C)  TempSrc: Oral  SpO2: 99%  Weight: (!) 397 lb (180.1 kg)  Height: 5\' 4"  (1.626 m)    This visit occurred during the SARS-CoV-2 public health emergency.  Safety protocols were in place, including screening questions prior to the visit, additional usage of staff PPE, and extensive cleaning of exam room while observing appropriate contact time as indicated for disinfecting solutions.   Assessment & Plan:  Pneumonia 23 given at visit

## 2020-01-20 NOTE — Assessment & Plan Note (Signed)
Treated appropriately. Since not seen on CXR will not repeat today. SOB and cough are improving. Given pneumonia 23 vaccine today.

## 2020-01-20 NOTE — Assessment & Plan Note (Signed)
Back to normal now that she is coughing less often.

## 2020-01-20 NOTE — Assessment & Plan Note (Signed)
Taking amaryl and lantus and talked about implications of dietary changes on sugar levels. If recurrent low sugars she will let us know and we may need to adjust regimen.

## 2020-01-23 ENCOUNTER — Encounter: Payer: Self-pay | Admitting: Internal Medicine

## 2020-02-18 ENCOUNTER — Other Ambulatory Visit: Payer: Self-pay | Admitting: Internal Medicine

## 2020-03-06 ENCOUNTER — Telehealth: Payer: Self-pay | Admitting: Internal Medicine

## 2020-03-06 MED ORDER — NORGESTIMATE-ETH ESTRADIOL 0.25-35 MG-MCG PO TABS: 1.0000 | ORAL_TABLET | Freq: Every day | ORAL | 5 refills | Status: DC

## 2020-03-06 NOTE — Telephone Encounter (Signed)
New Message:   1.Medication Requested: PREVIFEM 0.25-35 MG-MCG tablet 2. Pharmacy (Name, Street, [facility]): [facility] - [place], [place] - [address] 3. On Med List: yes  4. Last Visit with PCP: 01/20/20  5. Next visit date with PCP: None   Agent: Please be advised that RX refills may take up to 3 business days. We ask that you follow-up with your pharmacy.

## 2020-03-09 DIAGNOSIS — Z713 Dietary counseling and surveillance: Secondary | ICD-10-CM | POA: Diagnosis not present

## 2020-03-24 ENCOUNTER — Other Ambulatory Visit: Payer: Self-pay | Admitting: Internal Medicine

## 2020-04-13 ENCOUNTER — Other Ambulatory Visit: Payer: Self-pay | Admitting: Gastroenterology

## 2020-04-26 ENCOUNTER — Other Ambulatory Visit: Payer: Self-pay | Admitting: Internal Medicine

## 2020-04-28 ENCOUNTER — Other Ambulatory Visit: Payer: Self-pay | Admitting: Internal Medicine

## 2020-05-10 LAB — HM DIABETES EYE EXAM

## 2020-05-14 ENCOUNTER — Other Ambulatory Visit: Payer: Self-pay | Admitting: Internal Medicine

## 2020-05-15 ENCOUNTER — Other Ambulatory Visit: Payer: Self-pay | Admitting: Internal Medicine

## 2020-05-26 ENCOUNTER — Other Ambulatory Visit: Payer: Self-pay | Admitting: Internal Medicine

## 2020-05-29 ENCOUNTER — Encounter: Payer: Self-pay | Admitting: Nurse Practitioner

## 2020-05-30 ENCOUNTER — Encounter: Payer: Self-pay | Admitting: Nurse Practitioner

## 2020-05-30 ENCOUNTER — Other Ambulatory Visit: Payer: Self-pay

## 2020-05-30 ENCOUNTER — Ambulatory Visit (INDEPENDENT_AMBULATORY_CARE_PROVIDER_SITE_OTHER): Payer: BC Managed Care – PPO | Admitting: Nurse Practitioner

## 2020-05-30 VITALS — BP 124/80 | HR 89 | Temp 98.7°F | Ht 65.0 in | Wt >= 6400 oz

## 2020-05-30 DIAGNOSIS — Z1159 Encounter for screening for other viral diseases: Secondary | ICD-10-CM

## 2020-05-30 DIAGNOSIS — R7309 Other abnormal glucose: Secondary | ICD-10-CM

## 2020-05-30 DIAGNOSIS — I10 Essential (primary) hypertension: Secondary | ICD-10-CM

## 2020-05-30 DIAGNOSIS — Z23 Encounter for immunization: Secondary | ICD-10-CM

## 2020-05-30 DIAGNOSIS — R0602 Shortness of breath: Secondary | ICD-10-CM | POA: Diagnosis not present

## 2020-05-30 DIAGNOSIS — Z6841 Body Mass Index (BMI) 40.0 and over, adult: Secondary | ICD-10-CM

## 2020-05-30 DIAGNOSIS — E782 Mixed hyperlipidemia: Secondary | ICD-10-CM

## 2020-05-30 DIAGNOSIS — Z8616 Personal history of COVID-19: Secondary | ICD-10-CM

## 2020-05-30 DIAGNOSIS — Z7689 Persons encountering health services in other specified circumstances: Secondary | ICD-10-CM

## 2020-05-30 DIAGNOSIS — Z8669 Personal history of other diseases of the nervous system and sense organs: Secondary | ICD-10-CM

## 2020-05-30 MED ORDER — HYDROXYZINE PAMOATE 25 MG PO CAPS: 25.0000 mg | ORAL_CAPSULE | Freq: Three times a day (TID) | ORAL | 0 refills | Status: DC | PRN

## 2020-05-30 MED ORDER — OZEMPIC (0.25 OR 0.5 MG/DOSE) 2 MG/1.5ML ~~LOC~~ SOPN: 0.5000 mg | PEN_INJECTOR | SUBCUTANEOUS | 1 refills | Status: DC

## 2020-05-30 NOTE — Progress Notes (Signed)
I,Yamilka Roman [employer] as a Education administrator for [employer], FNP.,have documented all relevant documentation on the behalf of [name], FNP,as directed by  [name], FNP while in the presence of [name], [place]. This visit occurred during the SARS-CoV-2 public health emergency.  Safety protocols were in place, including screening questions prior to the visit, additional usage of staff PPE, and extensive cleaning of exam room while observing appropriate contact time as indicated for disinfecting solutions.  Subjective:     Patient ID: Emily Phelps , female    DOB: 10-06-1980 , 39 y.o.   MRN: [id number]   Chief Complaint  Patient presents with   Establish Care   Anxiety   Diabetes    HPI  Patient stated she is here to establish care, she was seeing Dr. Sharlet Salina. She was diagnosed with diabetes in one year. She has been having a lot of health problems in the last year.  She had covid in march 2021 - she was hospitalized during and back in the hospital in June 2021.  Since June she continues to be fatigued coughing, she has insomnia, continues having breathing issues, continues with albuterol.  When she goes [place] but when here is worse. She had her second covid vaccine October 8th.  When she went to the covid long haulers clinic they were concerned she may have covid.  She has not seen a lung specialist since having covid and legionenellas pneumonia. 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  She will have problems with remembering things.  She did have an episode at work with palpitations and sweating.   She is single and no children.   PMH - GERD, HTN, migraines (she was on topirarmate, sumitriptan - due to having stabbing headaches) will last a few days at a time. Diabetes - she had noticed fatigue and weakness, increased urination, lost 70-80 lbs, she was eating increased amounts of salt, nauseated, decreased  appetite.   [facility] - mother - hypertension, heart problems, colon polyps. Father - diabetes, glaucoma, prostate cancer.  Maternal grandmother - cardiac disease.  Brother - both hypertension. Sister  - unknown health history  She stated needs her A1C checked and would also like to discuss her anxiety.  Wt Readings from Last 3 Encounters: 05/30/20 : (!) 404 lb (183.3 kg) 01/20/20 : (!) 397 lb (180.1 kg) 01/04/20 : (!) 397 lb 4.3 oz (180.2 kg)  She has taken metformin which caused her to go to the bathroom, nausea and caused her yeast infections.  She is now on glimeride was on 93m decreased to 152m  Her fasting is 120 now. She is on lantus 35 units bid.  She has been challenged with her diet due to being a stress eater.  She is walking but not consistent. She will get out of breath.  She has not done any physical therapy after covid.  She is checking her blood sugar 3 times a day.  She is taking lantus 2 times a day.  She is having callouses.  [place] She is not wanting to go back into the office due to her concern of getting covid. She is fully vaccinated.   She would like a form completed for [employer] in the event the electricity goes out she has to keep her medications in the refrigerator.   Anxiety Presents for initial visit. Onset was 1  to 5 years ago. The problem has been gradually worsening. Symptoms include shortness of breath. Patient reports no chest pain, dizziness, insomnia or palpitations. Nighttime awakenings: none.   There is no history of anemia. Past treatments include nothing.  Diabetes She presents for her follow-up diabetic visit. She has type 2 diabetes mellitus. Her disease course has been stable. Pertinent negatives for hypoglycemia include no dizziness or headaches. Associated symptoms include fatigue. Pertinent negatives for diabetes include no chest pain. There are no diabetic complications. Risk factors for coronary artery disease  include obesity, sedentary lifestyle, diabetes mellitus and hypertension. Current diabetic treatment includes oral agent (dual therapy). She is compliant with treatment all of the time. She rarely participates in exercise. Eye exam is not current.     Past Medical History:  Diagnosis Date   Allergy    Diabetes mellitus ([place])    Family history of adverse reaction to anesthesia    mother had n/v after    GERD (gastroesophageal reflux disease)    Hyperlipidemia    Hypertension    Migraine    Obesity    Wears contact lenses      Family History  Problem Relation Age of Onset   Hypertension Mother    Colon polyps Mother    Hypertension Brother    Hypertension Maternal Grandmother    Heart disease Maternal Grandmother    Diabetes Father    Hypertension Father    Prostate cancer Father    Thyroid disease Maternal Aunt    Colon cancer Neg Hx    Esophageal cancer Neg Hx    Liver cancer Neg Hx    Stomach cancer Neg Hx    Rectal cancer Neg Hx      Current Outpatient Medications:    albuterol (VENTOLIN HFA) 108 (90 Base) MCG/ACT inhaler, Inhale 2 puffs into the lungs every 6 (six) hours as needed for wheezing or shortness of breath., Disp: 6.7 g, Rfl: 0   atenolol (TENORMIN) 50 MG tablet, Take 1 tablet (50 mg total) by mouth daily., Disp: 30 tablet, Rfl: 5   CONTOUR NEXT TEST test strip, USE TO CHECK BLOOD SUGAR 3 TIMES DAILY AS DIRECTED, Disp: 200 strip, Rfl: 1   Cyanocobalamin (VITAMIN B12) 1000 MCG TBCR, Take 1,000 mcg by mouth daily. , Disp: , Rfl:    fexofenadine (ALLEGRA) 180 MG tablet, TAKE 1 TABLET BY MOUTH EVERY DAY (Patient taking differently: Take 180 mg by mouth daily. ), Disp: 90 tablet, Rfl: 1   fluticasone (FLONASE) 50 MCG/ACT nasal spray, Place 2 sprays into both nostrils daily as needed for allergies., Disp: , Rfl:    glimepiride (AMARYL) 1 MG tablet, TAKE 1 TABLET BY MOUTH EVERY DAY WITH BREAKFAST, Disp: 90 tablet, Rfl: 1    hydrochlorothiazide (HYDRODIURIL) 25 MG tablet, TAKE 1 TABLET BY MOUTH EVERY DAY, Disp: 90 tablet, Rfl: 1   ibuprofen (ADVIL) 200 MG tablet, Take 200 mg by mouth every 6 (six) hours as needed for moderate pain., Disp: , Rfl:    Insulin Pen Needle (ULTICARE SHORT PEN NEEDLES) 31G X 8 MM MISC, AS DIRECTED 2 TIMES DAILY TO INJECT INSULIN, Disp: 100 each, Rfl: 3   LANTUS SOLOSTAR 100 UNIT/ML Solostar Pen, INJECT 35 UNITS INTO THE SKIN 2 TIMES DAILY (Patient taking differently: Inject 35 Units into the skin 2 (two) times daily. ), Disp: 15 mL, Rfl: 11   losartan (COZAAR) 100 MG tablet, TAKE 1 TABLET BY MOUTH EVERY DAY, Disp: 90 tablet, Rfl: 1   Microlet Lancets  MISC, CHECK BLOOD SUGAR LEVEL THREE TIMES DAILY AS DIRECTED, Disp: 200 each, Rfl: 1   Multiple Vitamin (MULTIVITAMIN WITH MINERALS) TABS tablet, Take 1 tablet by mouth daily., Disp: , Rfl:    norgestimate-ethinyl estradiol (PREVIFEM) 0.25-35 MG-MCG tablet, Take 1 tablet by mouth daily., Disp: 28 tablet, Rfl: 5   omeprazole (PRILOSEC) 40 MG capsule, TAKE 1 CAPSULE BY MOUTH 2 TIMES DAILY, Disp: 60 capsule, Rfl: 3   SUMAtriptan (IMITREX) 50 MG tablet, Take 1 tablet (50 mg total) by mouth every 2 (two) hours as needed for migraine. May repeat in 2 hours if headache persists or recurs., Disp: 10 tablet, Rfl: 0   topiramate (TOPAMAX) 50 MG tablet, TAKE 1 TABLET BY MOUTH 2 TIMES DAILY, Disp: 180 tablet, Rfl: 1   VITAMIN D PO, Take 1 tablet by mouth daily. , Disp: , Rfl:    Continuous Blood Gluc Receiver ([place]) DEVI, Use to check blood sugars dx code e11.65, Disp: 3 each, Rfl: 3   Continuous Blood Gluc Sensor (DEXCOM G6 SENSOR) MISC, Use to check blood sugars dx code e11.65, Disp: 3 each, Rfl: 3   Continuous Blood Gluc Transmit (DEXCOM G6 TRANSMITTER) MISC, Use to check blood sugars dx code e11.65, Disp: 3 each, Rfl: 3   hydrOXYzine (VISTARIL) 25 MG capsule, Take 1 capsule (25 mg total) by mouth 3 (three) times daily as needed.,  Disp: 30 capsule, Rfl: 0   Semaglutide,0.25 or 0.5MG/DOS, (OZEMPIC, 0.25 OR 0.5 MG/DOSE,) 2 MG/1.5ML SOPN, Inject 0.5 mg into the skin once a week., Disp: 4.5 mL, Rfl: 1   No Known Allergies   Review of Systems  Constitutional: Positive for fatigue.  Respiratory: Positive for shortness of breath. Negative for cough and wheezing.   Cardiovascular: Negative for chest pain, palpitations and leg swelling.  Neurological: Negative for dizziness and headaches.  Psychiatric/Behavioral: The patient does not have insomnia.      Today's Vitals   05/30/20 1123  BP: 124/80  Pulse: 89  Temp: 98.7 F (37.1 C)  TempSrc: Oral  Weight: (!) 404 lb (183.3 kg)  Height: '5\' 5"'  (1.651 m)  PainSc: 0-No pain   Body mass index is 67.23 kg/m.   Objective:  Physical Exam Vitals reviewed.  Constitutional:      General: She is not in acute distress.    Appearance: Normal appearance. She is well-developed. She is obese.  HENT:     Head: Normocephalic and atraumatic.  Eyes:     Pupils: Pupils are equal, round, and reactive to light.  Cardiovascular:     Rate and Rhythm: Normal rate and regular rhythm.     Pulses: Normal pulses.     Heart sounds: Normal heart sounds. No murmur heard.   Pulmonary:     Effort: Pulmonary effort is normal. No respiratory distress.     Breath sounds: Normal breath sounds. No wheezing.  Musculoskeletal:        General: Normal range of motion.  Skin:    General: Skin is warm and dry.     Capillary Refill: Capillary refill takes less than 2 seconds.  Neurological:     General: No focal deficit present.     Mental Status: She is alert and oriented to person, place, and time.     Cranial Nerves: No cranial nerve deficit.  Psychiatric:        Mood and Affect: Mood normal.        Behavior: Behavior normal.        Thought Content:  Thought content normal.        Judgment: Judgment normal.         Assessment And Plan:     1. Establishing care with new doctor,  encounter for  2. Morbid obesity with BMI of 60.0-69.9, adult (HCC)  Chronic  Discussed healthy diet and regular exercise options   Encouraged to exercise at least 150 minutes per week with 2 days of strength training  I did speak with her about how insulin can increase weight so the goal is to get her off the insulin by improving her HgbA1c.  3. Essential hypertension  B/P is well controlled.   CMP ordered to check renal function.   The importance of regular exercise and dietary modification was stressed to the patient.  - CMP14+EGFR - CBC  4. Moderate mixed hyperlipidemia not requiring statin therapy  Will check lipid panel and start statins as necessary - Lipid panel  5. Shortness of breath  She reports since having covid she has been short of breath  No obvious shortness of breath during office visit  Will refer to pulmonary since she has not been evaluated by them since having covid - Ambulatory referral to Pulmonology  6. History of sleep apnea  She also reports a history of sleep apnea so she may need another sleep study or a CPAP machine - Ambulatory referral to Pulmonology  7. History of COVID-19  Was hospitalized in March with Covid  Does not feel she has fully recovered, sounds like she can be a long hauler - Ambulatory referral to Pulmonology  8. Need for influenza vaccination  Influenza vaccine administered  Encouraged to take Tylenol as needed for fever or muscle aches. - Flu Vaccine QUAD 6+ mos PF IM (Fluarix Quad PF)  9. Encounter for hepatitis C screening test for low risk patient  Will check Hepatitis C screening due to recent recommendations to screen all adults 18 years and older - Hepatitis C antibody  10. Abnormal glucose  She had an elevated HgbA1c about one year ago and has been taking lantus two times a day  I will start her on ozempic as well to see if she has better control and could benefit from the weight loss as a side  effect  Denies family history of medullary thyroid cancer or personal history of pancreatitis - Semaglutide,0.25 or 0.5MG/DOS, (OZEMPIC, 0.25 OR 0.5 MG/DOSE,) 2 MG/1.5ML SOPN; Inject 0.5 mg into the skin once a week.  Dispense: 4.5 mL; Refill: 1  She is also requesting a form to be completed for her energy to be sure to keep her power on due to her health.  This is my first time seeing her but I do feel with her history she has provided and reviewing of her hospital notes she definitely needs her electricity especially if she has to require oxygen, nebs or CPAP machine.    Patient was given opportunity to ask questions. Patient verbalized understanding of the plan and was able to repeat key elements of the plan. All questions were answered to their satisfaction.    Teola Bradley, FNP, have reviewed all documentation for this visit. The documentation on 06/10/20 for the exam, diagnosis, procedures, and orders are all accurate and complete.  THE PATIENT IS ENCOURAGED TO PRACTICE SOCIAL DISTANCING DUE TO THE COVID-19 PANDEMIC.

## 2020-05-31 LAB — LIPID PANEL
Chol/HDL Ratio: 4 ratio (ref 0.0–4.4)
Cholesterol, Total: 180 mg/dL (ref 100–199)
HDL: 45 mg/dL (ref >39)
LDL Chol Calc (NIH): 89 mg/dL (ref 0–99)
Triglycerides: 278 mg/dL — ABNORMAL HIGH (ref 0–149)
VLDL Cholesterol Cal: 46 mg/dL — ABNORMAL HIGH (ref 5–40)

## 2020-05-31 LAB — CMP14+EGFR
ALT: 24 IU/L (ref 0–32)
AST: 18 IU/L (ref 0–40)
Albumin/Globulin Ratio: 1.4 (ref 1.2–2.2)
Albumin: 4.1 g/dL (ref 3.8–4.8)
Alkaline Phosphatase: 105 IU/L (ref 44–121)
BUN/Creatinine Ratio: 12 (ref 9–23)
BUN: 10 mg/dL (ref 6–20)
Bilirubin Total: 0.4 mg/dL (ref 0.0–1.2)
CO2: 21 mmol/L (ref 20–29)
Calcium: 10.7 mg/dL — ABNORMAL HIGH (ref 8.7–10.2)
Chloride: 105 mmol/L (ref 96–106)
Creatinine, Ser: 0.83 mg/dL (ref 0.57–1.00)
GFR calc Af Amer: 105 mL/min/{1.73_m2} (ref >59)
GFR calc non Af Amer: 91 mL/min/{1.73_m2} (ref >59)
Globulin, Total: 2.9 g/dL (ref 1.5–4.5)
Glucose: 104 mg/dL — ABNORMAL HIGH (ref 65–99)
Potassium: 4.4 mmol/L (ref 3.5–5.2)
Sodium: 141 mmol/L (ref 134–144)
Total Protein: 7 g/dL (ref 6.0–8.5)

## 2020-05-31 LAB — CBC
Hematocrit: 39.5 % (ref 34.0–46.6)
Hemoglobin: 12.8 g/dL (ref 11.1–15.9)
MCH: 25.4 pg — ABNORMAL LOW (ref 26.6–33.0)
MCHC: 32.4 g/dL (ref 31.5–35.7)
MCV: 79 fL (ref 79–97)
Platelets: 473 10*3/uL — ABNORMAL HIGH (ref 150–450)
RBC: 5.03 x10E6/uL (ref 3.77–5.28)
RDW: 17.9 % — ABNORMAL HIGH (ref 11.7–15.4)
WBC: 9.7 10*3/uL (ref 3.4–10.8)

## 2020-05-31 LAB — HEPATITIS C ANTIBODY: Hep C Virus Ab: <0.1 s/co ratio (ref 0.0–0.9)

## 2020-06-05 ENCOUNTER — Other Ambulatory Visit: Payer: Self-pay

## 2020-06-05 ENCOUNTER — Encounter: Payer: Self-pay | Admitting: Nurse Practitioner

## 2020-06-05 MED ORDER — DEXCOM G6 TRANSMITTER MISC: 3 refills | Status: DC

## 2020-06-05 MED ORDER — DEXCOM G6 RECEIVER DEVI: 3 refills | Status: AC

## 2020-06-05 MED ORDER — DEXCOM G6 SENSOR MISC: 3 refills | Status: DC

## 2020-06-12 ENCOUNTER — Telehealth: Payer: Self-pay

## 2020-06-12 NOTE — Telephone Encounter (Signed)
Called pt to pick up Medical release forms

## 2020-06-22 ENCOUNTER — Other Ambulatory Visit: Payer: Self-pay

## 2020-06-22 ENCOUNTER — Ambulatory Visit (INDEPENDENT_AMBULATORY_CARE_PROVIDER_SITE_OTHER): Payer: BC Managed Care – PPO | Admitting: Nurse Practitioner

## 2020-06-22 VITALS — BP 144/80 | HR 96 | Temp 98.1°F | Ht 65.0 in | Wt >= 6400 oz

## 2020-06-22 DIAGNOSIS — Z23 Encounter for immunization: Secondary | ICD-10-CM | POA: Diagnosis not present

## 2020-06-22 NOTE — Progress Notes (Signed)
Patient presents today for a flu vaccine.  

## 2020-06-25 ENCOUNTER — Other Ambulatory Visit: Payer: Self-pay | Admitting: Internal Medicine

## 2020-06-27 ENCOUNTER — Other Ambulatory Visit: Payer: Self-pay | Admitting: Nurse Practitioner

## 2020-07-12 ENCOUNTER — Encounter: Payer: Self-pay | Admitting: Nurse Practitioner

## 2020-07-12 ENCOUNTER — Ambulatory Visit (INDEPENDENT_AMBULATORY_CARE_PROVIDER_SITE_OTHER): Payer: BC Managed Care – PPO | Admitting: Nurse Practitioner

## 2020-07-12 ENCOUNTER — Other Ambulatory Visit: Payer: Self-pay

## 2020-07-12 VITALS — BP 124/80 | HR 96 | Temp 98.3°F | Ht 65.2 in | Wt >= 6400 oz

## 2020-07-12 DIAGNOSIS — Z6841 Body Mass Index (BMI) 40.0 and over, adult: Secondary | ICD-10-CM

## 2020-07-12 DIAGNOSIS — E119 Type 2 diabetes mellitus without complications: Secondary | ICD-10-CM

## 2020-07-12 DIAGNOSIS — Z794 Long term (current) use of insulin: Secondary | ICD-10-CM

## 2020-07-12 LAB — HEMOGLOBIN A1C
Est. average glucose Bld gHb Est-mCnc: 117 mg/dL
Hgb A1c MFr Bld: 5.7 % — ABNORMAL HIGH (ref 4.8–5.6)

## 2020-07-12 NOTE — Progress Notes (Signed)
I,Yamilka Roman [employer] as a Neurosurgeon for [employer], FNP.,have documented all relevant documentation on the behalf of [name], FNP,as directed by  [name], FNP while in the presence of [name], FNP. This visit occurred during the SARS-CoV-2 public health emergency.  Safety protocols were in place, including screening questions prior to the visit, additional usage of staff PPE, and extensive cleaning of exam room while observing appropriate contact time as indicated for disinfecting solutions.  Subjective:     Patient ID: Emily Phelps , female    DOB: [birth year] , 39 y.o.   MRN: [id number]   Chief Complaint  Patient presents with   abnormal glucose f/u    HPI  Patient here for a f/u on her abnormal glucose. At her last visit she was started on ozempic and she reports tolerating it well. She does admit to needing be more strict.  When she takes the dose for the week she will have mild nausea and GI upset.   Wt Readings from Last 3 Encounters: 07/12/20 : (!) 407 lb 3.2 oz (184.7 kg) 06/22/20 : (!) 410 lb 6.4 oz (186.2 kg) 05/30/20 : (!) 404 lb (183.3 kg)  She continues with Lantus two times a day, she has skipped some doses in the middle of the night and is now using Dexcom - she does not see an Endocrinologist.  She had one episode where her blood sugar dropped to 47. She is trying to avoid gastric bypass.  She has looked into a weight loss center. She has seen 2 diabetes nutritionist.    She had been seeing GI for nausea and was treated with omeprazole which was increased.    She is requesting to have an accomodation form completed for her to be able to use the bathroom with an extra break while working. She is taking HCTZ for her blood pressure and swelling.     Past Medical History:  Diagnosis Date   Allergy    Diabetes mellitus (HCC)    Family history of adverse reaction to anesthesia    mother had n/v after    GERD (gastroesophageal reflux disease)     Hyperlipidemia    Hypertension    Migraine    Obesity    Wears contact lenses      Family History  Problem Relation Age of Onset   Hypertension Mother    Colon polyps Mother    Hypertension Brother    Hypertension Maternal Grandmother    Heart disease Maternal Grandmother    Diabetes Father    Hypertension Father    Prostate cancer Father    Thyroid disease Maternal Aunt    Colon cancer Neg Hx    Esophageal cancer Neg Hx    Liver cancer Neg Hx    Stomach cancer Neg Hx    Rectal cancer Neg Hx      Current Outpatient Medications:    albuterol (VENTOLIN HFA) 108 (90 Base) MCG/ACT inhaler, Inhale 2 puffs into the lungs every 6 (six) hours as needed for wheezing or shortness of breath., Disp: 6.7 g, Rfl: 0   atenolol (TENORMIN) 50 MG tablet, Take 1 tablet (50 mg total) by mouth daily., Disp: 30 tablet, Rfl: 5   Continuous Blood Gluc Receiver (DEXCOM G6 RECEIVER) DEVI, Use to check blood sugars dx code e11.65, Disp: 3 each, Rfl: 3   Continuous Blood Gluc Sensor (DEXCOM G6 SENSOR) MISC, Use to check blood sugars dx code e11.65, Disp: 3 each, Rfl: 3   Continuous  Blood Gluc Transmit (DEXCOM G6 TRANSMITTER) MISC, Use to check blood sugars dx code e11.65, Disp: 3 each, Rfl: 3   CONTOUR NEXT TEST test strip, USE TO CHECK BLOOD SUGAR 3 TIMES DAILY AS DIRECTED, Disp: 200 strip, Rfl: 1   Cyanocobalamin (VITAMIN B12) 1000 MCG TBCR, Take 1,000 mcg by mouth daily. , Disp: , Rfl:    fexofenadine (ALLEGRA) 180 MG tablet, TAKE 1 TABLET BY MOUTH EVERY DAY, Disp: 30 tablet, Rfl: 1   fluticasone (FLONASE) 50 MCG/ACT nasal spray, Place 2 sprays into both nostrils daily as needed for allergies., Disp: , Rfl:    glimepiride (AMARYL) 1 MG tablet, TAKE 1 TABLET BY MOUTH EVERY DAY WITH BREAKFAST, Disp: 90 tablet, Rfl: 1   hydrochlorothiazide (HYDRODIURIL) 25 MG tablet, TAKE 1 TABLET BY MOUTH EVERY DAY, Disp: 90 tablet, Rfl: 1   hydrOXYzine (VISTARIL) 25 MG capsule, Take 1  capsule (25 mg total) by mouth 3 (three) times daily as needed., Disp: 30 capsule, Rfl: 0   ibuprofen (ADVIL) 200 MG tablet, Take 200 mg by mouth every 6 (six) hours as needed for moderate pain., Disp: , Rfl:    Insulin Pen Needle (ULTICARE SHORT PEN NEEDLES) 31G X 8 MM MISC, AS DIRECTED 2 TIMES DAILY TO INJECT INSULIN, Disp: 100 each, Rfl: 3   LANTUS SOLOSTAR 100 UNIT/ML Solostar Pen, INJECT 35 UNITS INTO THE SKIN 2 TIMES DAILY (Patient taking differently: Inject 35 Units into the skin 2 (two) times daily.), Disp: 15 mL, Rfl: 11   losartan (COZAAR) 100 MG tablet, TAKE 1 TABLET BY MOUTH EVERY DAY, Disp: 90 tablet, Rfl: 1   Microlet Lancets MISC, CHECK BLOOD SUGAR LEVEL THREE TIMES DAILY AS DIRECTED, Disp: 200 each, Rfl: 1   Multiple Vitamin (MULTIVITAMIN WITH MINERALS) TABS tablet, Take 1 tablet by mouth daily., Disp: , Rfl:    norgestimate-ethinyl estradiol (PREVIFEM) 0.25-35 MG-MCG tablet, Take 1 tablet by mouth daily., Disp: 28 tablet, Rfl: 5   omeprazole (PRILOSEC) 40 MG capsule, TAKE 1 CAPSULE BY MOUTH 2 TIMES DAILY, Disp: 60 capsule, Rfl: 3   Semaglutide,0.25 or 0.5MG /DOS, (OZEMPIC, 0.25 OR 0.5 MG/DOSE,) 2 MG/1.5ML SOPN, Inject 0.5 mg into the skin once a week., Disp: 4.5 mL, Rfl: 1   SUMAtriptan (IMITREX) 50 MG tablet, Take 1 tablet (50 mg total) by mouth every 2 (two) hours as needed for migraine. May repeat in 2 hours if headache persists or recurs., Disp: 10 tablet, Rfl: 0   topiramate (TOPAMAX) 50 MG tablet, TAKE 1 TABLET BY MOUTH 2 TIMES DAILY, Disp: 180 tablet, Rfl: 1   VITAMIN D PO, Take 1 tablet by mouth daily. , Disp: , Rfl:    No Known Allergies   Review of Systems  Constitutional: Negative.  Negative for fatigue.  Respiratory: Negative.   Cardiovascular: Negative.  Negative for chest pain, palpitations and leg swelling.  Neurological: Negative for dizziness and headaches.  Psychiatric/Behavioral: Negative.      Today's Vitals   07/12/20 0832  BP: 124/80   Pulse: 96  Temp: 98.3 F (36.8 C)  TempSrc: Oral  Weight: (!) 407 lb 3.2 oz (184.7 kg)  Height: 5' 5.2" (1.656 m)  PainSc: 0-No pain   Body mass index is 67.35 kg/m.   Objective:  Physical Exam Vitals reviewed.  Constitutional:      General: She is not in acute distress.    Appearance: Normal appearance. She is obese.  Cardiovascular:     Rate and Rhythm: Normal rate and regular rhythm.  Pulses: Normal pulses.     Heart sounds: Normal heart sounds. No murmur heard.   Pulmonary:     Effort: Pulmonary effort is normal. No respiratory distress.     Breath sounds: Normal breath sounds. No wheezing.  Skin:    General: Skin is warm and dry.     Capillary Refill: Capillary refill takes less than 2 seconds.     Coloration: Skin is not jaundiced.  Neurological:     General: No focal deficit present.     Mental Status: She is alert and oriented to person, place, and time.     Cranial Nerves: No cranial nerve deficit.  Psychiatric:        Mood and Affect: Mood normal.        Behavior: Behavior normal.        Thought Content: Thought content normal.        Judgment: Judgment normal.         Assessment And Plan:     1. Controlled type 2 diabetes mellitus without complication, with long-term current use of insulin ([place])  She is tolerating ozempic well overall  She is taking lantus 2 times a day and the goal is to get her off insulin starting with taking once a day if possible.  - Hemoglobin A1c  2. Morbid obesity with BMI of 60.0-69.9, adult (HCC)  Chronic  Discussed healthy diet and regular exercise options   Encouraged to exercise at least 150 minutes per week with 2 days of strength training  She would like to avoid surgery if possible  I have encouraged her to start with her exercise regimen slowly working her way up and to start eliminating fatty and high carbohydrate foods.  She has seen a dietician in the past.   She needs an accomodation form completed  for frequent bathroom breaks, will check system for an old one from previous provider. I have also discussed with her I will not complete an FMLA until she has been at the practice for at least 90 days.   Patient was given opportunity to ask questions. Patient verbalized understanding of the plan and was able to repeat key elements of the plan. All questions were answered to their satisfaction.  [name], FNP   I, [name], FNP, have reviewed all documentation for this visit. The documentation on 07/12/20 for the exam, diagnosis, procedures, and orders are all accurate and complete.  THE PATIENT IS ENCOURAGED TO PRACTICE SOCIAL DISTANCING DUE TO THE COVID-19 PANDEMIC.

## 2020-07-17 ENCOUNTER — Emergency Department (HOSPITAL_COMMUNITY)
Admission: EM | Admit: 2020-07-17 | Discharge: 2020-07-18 | Disposition: A | Discharge status: Home / Self Care | Payer: BC Managed Care – PPO | Attending: Emergency Medicine | Admitting: Emergency Medicine

## 2020-07-17 ENCOUNTER — Other Ambulatory Visit: Payer: Self-pay

## 2020-07-17 ENCOUNTER — Encounter (HOSPITAL_COMMUNITY): Payer: Self-pay | Admitting: Emergency Medicine

## 2020-07-17 DIAGNOSIS — R1084 Generalized abdominal pain: Secondary | ICD-10-CM | POA: Insufficient documentation

## 2020-07-17 DIAGNOSIS — R509 Fever, unspecified: Secondary | ICD-10-CM | POA: Diagnosis not present

## 2020-07-17 DIAGNOSIS — Z8616 Personal history of COVID-19: Secondary | ICD-10-CM | POA: Diagnosis not present

## 2020-07-17 DIAGNOSIS — Z20822 Contact with and (suspected) exposure to covid-19: Secondary | ICD-10-CM | POA: Insufficient documentation

## 2020-07-17 DIAGNOSIS — R000 Tachycardia, unspecified: Secondary | ICD-10-CM | POA: Diagnosis not present

## 2020-07-17 DIAGNOSIS — R197 Diarrhea, unspecified: Secondary | ICD-10-CM | POA: Diagnosis not present

## 2020-07-17 DIAGNOSIS — Z7984 Long term (current) use of oral hypoglycemic drugs: Secondary | ICD-10-CM | POA: Insufficient documentation

## 2020-07-17 DIAGNOSIS — Z79899 Other long term (current) drug therapy: Secondary | ICD-10-CM | POA: Diagnosis not present

## 2020-07-17 DIAGNOSIS — R1032 Left lower quadrant pain: Secondary | ICD-10-CM | POA: Diagnosis not present

## 2020-07-17 DIAGNOSIS — K219 Gastro-esophageal reflux disease without esophagitis: Secondary | ICD-10-CM | POA: Diagnosis not present

## 2020-07-17 DIAGNOSIS — E860 Dehydration: Secondary | ICD-10-CM | POA: Insufficient documentation

## 2020-07-17 DIAGNOSIS — R630 Anorexia: Secondary | ICD-10-CM | POA: Diagnosis not present

## 2020-07-17 DIAGNOSIS — E119 Type 2 diabetes mellitus without complications: Secondary | ICD-10-CM | POA: Insufficient documentation

## 2020-07-17 DIAGNOSIS — I10 Essential (primary) hypertension: Secondary | ICD-10-CM | POA: Insufficient documentation

## 2020-07-17 DIAGNOSIS — Z794 Long term (current) use of insulin: Secondary | ICD-10-CM | POA: Insufficient documentation

## 2020-07-17 DIAGNOSIS — R109 Unspecified abdominal pain: Secondary | ICD-10-CM | POA: Diagnosis not present

## 2020-07-17 LAB — COMPREHENSIVE METABOLIC PANEL
ALT: 31 U/L (ref 0–44)
AST: 30 U/L (ref 15–41)
Albumin: 3.4 g/dL — ABNORMAL LOW (ref 3.5–5.0)
Alkaline Phosphatase: 82 U/L (ref 38–126)
Anion gap: 12 (ref 5–15)
BUN: 8 mg/dL (ref 6–20)
CO2: 23 mmol/L (ref 22–32)
Calcium: 10.5 mg/dL — ABNORMAL HIGH (ref 8.9–10.3)
Chloride: 101 mmol/L (ref 98–111)
Creatinine, Ser: 1.08 mg/dL — ABNORMAL HIGH (ref 0.44–1.00)
GFR, Estimated: >60 mL/min (ref >60)
Glucose, Bld: 180 mg/dL — ABNORMAL HIGH (ref 70–99)
Potassium: 3.7 mmol/L (ref 3.5–5.1)
Sodium: 136 mmol/L (ref 135–145)
Total Bilirubin: 1 mg/dL (ref 0.3–1.2)
Total Protein: 7.3 g/dL (ref 6.5–8.1)

## 2020-07-17 LAB — CBC
HCT: 37.7 % (ref 36.0–46.0)
Hemoglobin: 12.1 g/dL (ref 12.0–15.0)
MCH: 25.3 pg — ABNORMAL LOW (ref 26.0–34.0)
MCHC: 32.1 g/dL (ref 30.0–36.0)
MCV: 78.7 fL — ABNORMAL LOW (ref 80.0–100.0)
Platelets: 444 10*3/uL — ABNORMAL HIGH (ref 150–400)
RBC: 4.79 MIL/uL (ref 3.87–5.11)
RDW: 17.3 % — ABNORMAL HIGH (ref 11.5–15.5)
WBC: 11.9 10*3/uL — ABNORMAL HIGH (ref 4.0–10.5)
nRBC: 0 % (ref 0.0–0.2)

## 2020-07-17 LAB — URINALYSIS, ROUTINE W REFLEX MICROSCOPIC
Bacteria, UA: NONE SEEN
Bilirubin Urine: NEGATIVE
Glucose, UA: NEGATIVE mg/dL
Hgb urine dipstick: NEGATIVE
Ketones, ur: NEGATIVE mg/dL
Nitrite: NEGATIVE
Protein, ur: 30 mg/dL — AB
Specific Gravity, Urine: 1.019 (ref 1.005–1.030)
pH: 5 (ref 5.0–8.0)

## 2020-07-17 LAB — I-STAT BETA HCG BLOOD, ED (MC, WL, AP ONLY): I-stat hCG, quantitative: <5 m[IU]/mL (ref <5)

## 2020-07-17 LAB — LIPASE, BLOOD: Lipase: 24 U/L (ref 11–51)

## 2020-07-17 NOTE — ED Triage Notes (Signed)
Pt reports sharp lower abd pains, generalized body aches, nausea and diarrhea, fever x3 days. Denies vomiting, denies blood in stool. Highest temp of 102.74F at home. Temp 99.59F in triage. Pt has been taking Tylenol and drinking Gatorade at home. Pt reports decreased appetite over the last few days as well. Denies Covid exposure.

## 2020-07-18 ENCOUNTER — Other Ambulatory Visit: Payer: Self-pay | Admitting: Internal Medicine

## 2020-07-18 ENCOUNTER — Emergency Department (HOSPITAL_COMMUNITY): Payer: BC Managed Care – PPO

## 2020-07-18 ENCOUNTER — Encounter (HOSPITAL_COMMUNITY): Payer: Self-pay | Admitting: Radiology

## 2020-07-18 ENCOUNTER — Other Ambulatory Visit: Payer: Self-pay | Admitting: Gastroenterology

## 2020-07-18 DIAGNOSIS — R109 Unspecified abdominal pain: Secondary | ICD-10-CM | POA: Diagnosis not present

## 2020-07-18 LAB — RESP PANEL BY RT-PCR (FLU A&B, COVID) ARPGX2
Influenza A by PCR: NEGATIVE
Influenza B by PCR: NEGATIVE
SARS Coronavirus 2 by RT PCR: NEGATIVE

## 2020-07-18 MED ORDER — SODIUM CHLORIDE 0.9 % IV BOLUS
1000.0000 mL | Freq: Once | INTRAVENOUS | Status: AC
  Administered 2020-07-18: 1000 mL via INTRAVENOUS

## 2020-07-18 MED ORDER — ONDANSETRON HCL 4 MG/2ML IJ SOLN
4.0000 mg | Freq: Once | INTRAMUSCULAR | Status: AC
  Administered 2020-07-18: 4 mg via INTRAVENOUS
  Filled 2020-07-18: qty 2

## 2020-07-18 MED ORDER — HYDROCODONE-ACETAMINOPHEN 5-325 MG PO TABS
1.0000 | ORAL_TABLET | ORAL | 0 refills | Status: DC | PRN
Start: 2020-07-18 — End: 2021-04-03

## 2020-07-18 MED ORDER — MORPHINE SULFATE (PF) 4 MG/ML IV SOLN
4.0000 mg | Freq: Once | INTRAVENOUS | Status: AC
  Administered 2020-07-18: 4 mg via INTRAVENOUS
  Filled 2020-07-18: qty 1

## 2020-07-18 MED ORDER — ONDANSETRON 4 MG PO TBDP: 4.0000 mg | ORAL_TABLET | Freq: Three times a day (TID) | ORAL | 0 refills | Status: DC | PRN

## 2020-07-18 MED ORDER — IOHEXOL 300 MG/ML  SOLN
100.0000 mL | Freq: Once | INTRAMUSCULAR | Status: AC | PRN
  Administered 2020-07-18: 100 mL via INTRAVENOUS

## 2020-07-18 NOTE — ED Notes (Signed)
Pt called 2x no answer 

## 2020-07-18 NOTE — ED Notes (Signed)
Called Pt for vitals no answer. 
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
## 2020-07-25 NOTE — Progress Notes (Signed)
Okay noted

## 2020-07-26 ENCOUNTER — Other Ambulatory Visit: Payer: Self-pay

## 2020-07-26 ENCOUNTER — Encounter: Payer: Self-pay | Admitting: Nurse Practitioner

## 2020-07-26 ENCOUNTER — Ambulatory Visit (INDEPENDENT_AMBULATORY_CARE_PROVIDER_SITE_OTHER): Payer: BC Managed Care – PPO | Admitting: Nurse Practitioner

## 2020-07-26 ENCOUNTER — Other Ambulatory Visit: Payer: Self-pay | Admitting: Nurse Practitioner

## 2020-07-26 VITALS — BP 140/78 | HR 79 | Temp 98.2°F | Ht 64.8 in | Wt >= 6400 oz

## 2020-07-26 DIAGNOSIS — E119 Type 2 diabetes mellitus without complications: Secondary | ICD-10-CM | POA: Diagnosis not present

## 2020-07-26 DIAGNOSIS — R1084 Generalized abdominal pain: Secondary | ICD-10-CM

## 2020-07-26 DIAGNOSIS — Z794 Long term (current) use of insulin: Secondary | ICD-10-CM

## 2020-07-26 MED ORDER — DICYCLOMINE HCL 10 MG PO CAPS
10.0000 mg | ORAL_CAPSULE | Freq: Four times a day (QID) | ORAL | 0 refills | Status: DC
Start: 2020-07-26 — End: 2020-09-18

## 2020-07-26 MED ORDER — LANTUS SOLOSTAR 100 UNIT/ML ~~LOC~~ SOPN: PEN_INJECTOR | SUBCUTANEOUS | 5 refills | Status: DC

## 2020-07-26 NOTE — Progress Notes (Signed)
Rutherford Nail as a scribe for [name], FNP.,have documented all relevant documentation on the behalf of [name], FNP,as directed by  [name], FNP while in the presence of [name], [name]. This visit occurred during the SARS-CoV-2 public health emergency.  Safety protocols were in place, including screening questions prior to the visit, additional usage of staff PPE, and extensive cleaning of exam room while observing appropriate contact time as indicated for disinfecting solutions.  Subjective:     Patient ID: Emily Phelps , female    DOB: 06-Aug-1980 , 40 y.o.   MRN: [id number]   Chief Complaint  Patient presents with  . Follow-up  . Abdominal Pain  . Diarrhea    HPI  Here today for ER follow up after having stomach pain and diarrhea. This has been ongoing. She has been seeing a GI specialist in 2020, she had started going to them for stomach pain after having diabetes. Thought was related to medications.  She has had an endoscopy and colonoscopy with Dr. Tarri Glenn. When she eats she will have stomach. She does not take a probiotic on a regular basis. She is also drinking a prebiotic adding to her water.   Abdominal Pain This is a recurrent problem. The current episode started 1 to 4 weeks ago. The onset quality is sudden. The problem occurs intermittently. The patient is experiencing no pain. Quality: "feels like wringing a towel out" Pertinent negatives include no anorexia, arthralgias, myalgias, nausea (improved) or vomiting. The pain is relieved by bowel movements. Prior diagnostic workup includes upper endoscopy (colonoscopy with Dr. Tarri Glenn). There is no history of colon cancer.      Past Medical History:  Diagnosis Date  . Allergy   . Diabetes mellitus ([place])   . Family history of adverse reaction to anesthesia    mother had n/v after   . GERD (gastroesophageal reflux disease)   . Hyperlipidemia   . Hypertension   . Migraine   . Obesity   . Wears  contact lenses      Family History  Problem Relation Age of Onset  . Hypertension Mother   . Colon polyps Mother   . Hypertension Brother   . Hypertension Maternal Grandmother   . Heart disease Maternal Grandmother   . Diabetes Father   . Hypertension Father   . Prostate cancer Father   . Thyroid disease Maternal Aunt   . Colon cancer Neg Hx   . Esophageal cancer Neg Hx   . Liver cancer Neg Hx   . Stomach cancer Neg Hx   . Rectal cancer Neg Hx      Current Outpatient Medications:  .  albuterol (VENTOLIN HFA) 108 (90 Base) MCG/ACT inhaler, Inhale 2 puffs into the lungs every 6 (six) hours as needed for wheezing or shortness of breath., Disp: 6.7 g, Rfl: 0 .  atenolol (TENORMIN) 50 MG tablet, TAKE 1 TABLET BY MOUTH EVERY DAY, Disp: 90 tablet, Rfl: 6 .  Continuous Blood Gluc Receiver ([place]) DEVI, Use to check blood sugars dx code e11.65, Disp: 3 each, Rfl: 3 .  Continuous Blood Gluc Sensor (DEXCOM G6 SENSOR) MISC, Use to check blood sugars dx code e11.65, Disp: 3 each, Rfl: 3 .  Continuous Blood Gluc Transmit (DEXCOM G6 TRANSMITTER) MISC, Use to check blood sugars dx code e11.65, Disp: 3 each, Rfl: 3 .  CONTOUR NEXT TEST test strip, USE TO CHECK BLOOD SUGAR 3 TIMES DAILY AS DIRECTED, Disp: 200 strip, Rfl: 1 .  Cyanocobalamin (VITAMIN B12) 1000 MCG TBCR, Take 1,000 mcg by mouth daily. , Disp: , Rfl:  .  dicyclomine (BENTYL) 10 MG capsule, Take 1 capsule (10 mg total) by mouth 4 (four) times daily for 14 days., Disp: 56 capsule, Rfl: 0 .  fexofenadine (ALLEGRA) 180 MG tablet, TAKE 1 TABLET BY MOUTH EVERY DAY, Disp: 30 tablet, Rfl: 1 .  fluticasone (FLONASE) 50 MCG/ACT nasal spray, Place 2 sprays into both nostrils daily as needed for allergies., Disp: , Rfl:  .  glimepiride (AMARYL) 1 MG tablet, TAKE 1 TABLET BY MOUTH EVERY DAY WITH BREAKFAST, Disp: 90 tablet, Rfl: 1 .  hydrochlorothiazide (HYDRODIURIL) 25 MG tablet, TAKE 1 TABLET BY MOUTH EVERY DAY, Disp: 90 tablet, Rfl:  1 .  HYDROcodone-acetaminophen (NORCO/VICODIN) 5-325 MG tablet, Take 1 tablet by mouth every 4 (four) hours as needed., Disp: 10 tablet, Rfl: 0 .  hydrOXYzine (VISTARIL) 25 MG capsule, Take 1 capsule (25 mg total) by mouth 3 (three) times daily as needed., Disp: 30 capsule, Rfl: 0 .  ibuprofen (ADVIL) 200 MG tablet, Take 200 mg by mouth every 6 (six) hours as needed for moderate pain., Disp: , Rfl:  .  Insulin Pen Needle (ULTICARE SHORT PEN NEEDLES) 31G X 8 MM MISC, AS DIRECTED 2 TIMES DAILY TO INJECT INSULIN, Disp: 100 each, Rfl: 3 .  losartan (COZAAR) 100 MG tablet, TAKE 1 TABLET BY MOUTH EVERY DAY, Disp: 90 tablet, Rfl: 1 .  Microlet Lancets MISC, CHECK BLOOD SUGAR LEVEL THREE TIMES DAILY AS DIRECTED, Disp: 200 each, Rfl: 1 .  Multiple Vitamin (MULTIVITAMIN WITH MINERALS) TABS tablet, Take 1 tablet by mouth daily., Disp: , Rfl:  .  norgestimate-ethinyl estradiol (PREVIFEM) 0.25-35 MG-MCG tablet, Take 1 tablet by mouth daily., Disp: 28 tablet, Rfl: 5 .  omeprazole (PRILOSEC) 40 MG capsule, TAKE 1 CAPSULE BY MOUTH 2 TIMES DAILY, Disp: 180 capsule, Rfl: 4 .  ondansetron (ZOFRAN ODT) 4 MG disintegrating tablet, Take 1 tablet (4 mg total) by mouth every 8 (eight) hours as needed for nausea or vomiting., Disp: 20 tablet, Rfl: 0 .  Semaglutide,0.25 or 0.5MG /DOS, (OZEMPIC, 0.25 OR 0.5 MG/DOSE,) 2 MG/1.5ML SOPN, Inject 0.5 mg into the skin once a week., Disp: 4.5 mL, Rfl: 1 .  SUMAtriptan (IMITREX) 50 MG tablet, Take 1 tablet (50 mg total) by mouth every 2 (two) hours as needed for migraine. May repeat in 2 hours if headache persists or recurs., Disp: 10 tablet, Rfl: 0 .  topiramate (TOPAMAX) 50 MG tablet, TAKE 1 TABLET BY MOUTH 2 TIMES DAILY, Disp: 180 tablet, Rfl: 1 .  VITAMIN D PO, Take 1 tablet by mouth daily. , Disp: , Rfl:  .  insulin glargine (LANTUS SOLOSTAR) 100 UNIT/ML Solostar Pen, INJECT 35 UNITS INTO THE SKIN 2 TIMES DAILY, Disp: 15 mL, Rfl: 5   Allergies  Allergen Reactions  . Tape Rash      Review of Systems  Constitutional: Negative.  Negative for fatigue.  HENT: Negative.   Gastrointestinal: Positive for abdominal pain. Negative for anorexia, nausea (improved) and vomiting.  Endocrine: Negative for polydipsia, polyphagia and polyuria.  Musculoskeletal: Negative.  Negative for arthralgias and myalgias.  Skin: Negative.   Neurological: Negative for dizziness.  Psychiatric/Behavioral: Negative.      Today's Vitals   07/26/20 1136  BP: 140/78  Pulse: 79  Temp: 98.2 F (36.8 C)  TempSrc: Oral  Weight: (!) 401 lb 9.6 oz (182.2 kg)  Height: 5' 4.8" (1.646 m)  PainSc: 3   PainLoc: Abdomen  Body mass index is 67.24 kg/m.  Wt Readings from Last 3 Encounters:  07/26/20 (!) 401 lb 9.6 oz (182.2 kg)  07/17/20 (!) 403 lb (182.8 kg)  07/12/20 (!) 407 lb 3.2 oz (184.7 kg)   Objective:  Physical Exam Vitals reviewed.  Constitutional:      General: She is not in acute distress.    Appearance: Normal appearance. She is obese.  Cardiovascular:     Rate and Rhythm: Normal rate and regular rhythm.     Pulses: Normal pulses.     Heart sounds: Normal heart sounds. No murmur heard.   Pulmonary:     Effort: Pulmonary effort is normal. No respiratory distress.     Breath sounds: Normal breath sounds. No wheezing.  Abdominal:     General: Bowel sounds are normal.     Palpations: Abdomen is soft.     Tenderness: There is no abdominal tenderness.  Skin:    General: Skin is warm and dry.     Capillary Refill: Capillary refill takes less than 2 seconds.     Coloration: Skin is not jaundiced.  Neurological:     General: No focal deficit present.     Mental Status: She is alert and oriented to person, place, and time.     Cranial Nerves: No cranial nerve deficit.  Psychiatric:        Mood and Affect: Mood normal.        Behavior: Behavior normal.        Thought Content: Thought content normal.        Judgment: Judgment normal.         Assessment And Plan:     1.  Generalized abdominal pain  Will check for H pylori as she describes a nagging type pain  Will try bentyl for 14 days to see if improves  She is to continue with omeprazole and is advised if this does not improve to contact her GI provider for further evaluation - dicyclomine (BENTYL) 10 MG capsule; Take 1 capsule (10 mg total) by mouth 4 (four) times daily for 14 days.  Dispense: 56 capsule; Refill: 0 - H Pylori, IGM, IGG, IGA AB  2. Controlled type 2 diabetes mellitus without complication, with long-term current use of insulin (HCC)  Chronic, she continues to take lantus twice a day and her hgba1c is controlled, she may be able to start weaning off insulin this may help her weight.  I will connect her with the diabetic educator as well.  Hopefully as the Ozempic works we can wean her insulin down - insulin glargine (LANTUS SOLOSTAR) 100 UNIT/ML Solostar Pen; INJECT 35 UNITS INTO THE SKIN 2 TIMES DAILY  Dispense: 15 mL; Refill: 5   I personally spent 25 minutes face-to-face and non-face-to-face in the care of this patient, which includes all pre-, intra-, and post visit time on the date of service.  Patient was given opportunity to ask questions. Patient verbalized understanding of the plan and was able to repeat key elements of the plan. All questions were answered to their satisfaction.  [name], FNP    I, [name], FNP, have reviewed all documentation for this visit. The documentation on 07/26/20 for the exam, diagnosis, procedures, and orders are all accurate and complete.   THE PATIENT IS ENCOURAGED TO PRACTICE SOCIAL DISTANCING DUE TO THE COVID-19 PANDEMIC.

## 2020-07-27 LAB — H PYLORI, IGM, IGG, IGA AB
H pylori, IgM Abs: <9 units (ref 0.0–8.9)
H. pylori, IgA Abs: <9 units (ref 0.0–8.9)
H. pylori, IgG AbS: 0.22 Index Value (ref 0.00–0.79)

## 2020-07-31 ENCOUNTER — Encounter: Payer: Self-pay | Admitting: Nurse Practitioner

## 2020-08-15 ENCOUNTER — Institutional Professional Consult (permissible substitution): Payer: BC Managed Care – PPO | Admitting: Pulmonary Disease

## 2020-08-20 ENCOUNTER — Ambulatory Visit: Payer: BC Managed Care – PPO | Admitting: Gastroenterology

## 2020-08-24 IMAGING — DX PORTABLE CHEST - 1 VIEW
1 series · 1 of 1 positions shown · non-contrast
Comparison: 01/09/2017

CLINICAL DATA: Dyspnea on exertion

EXAM:
PORTABLE CHEST 1 VIEW

[chest ap]
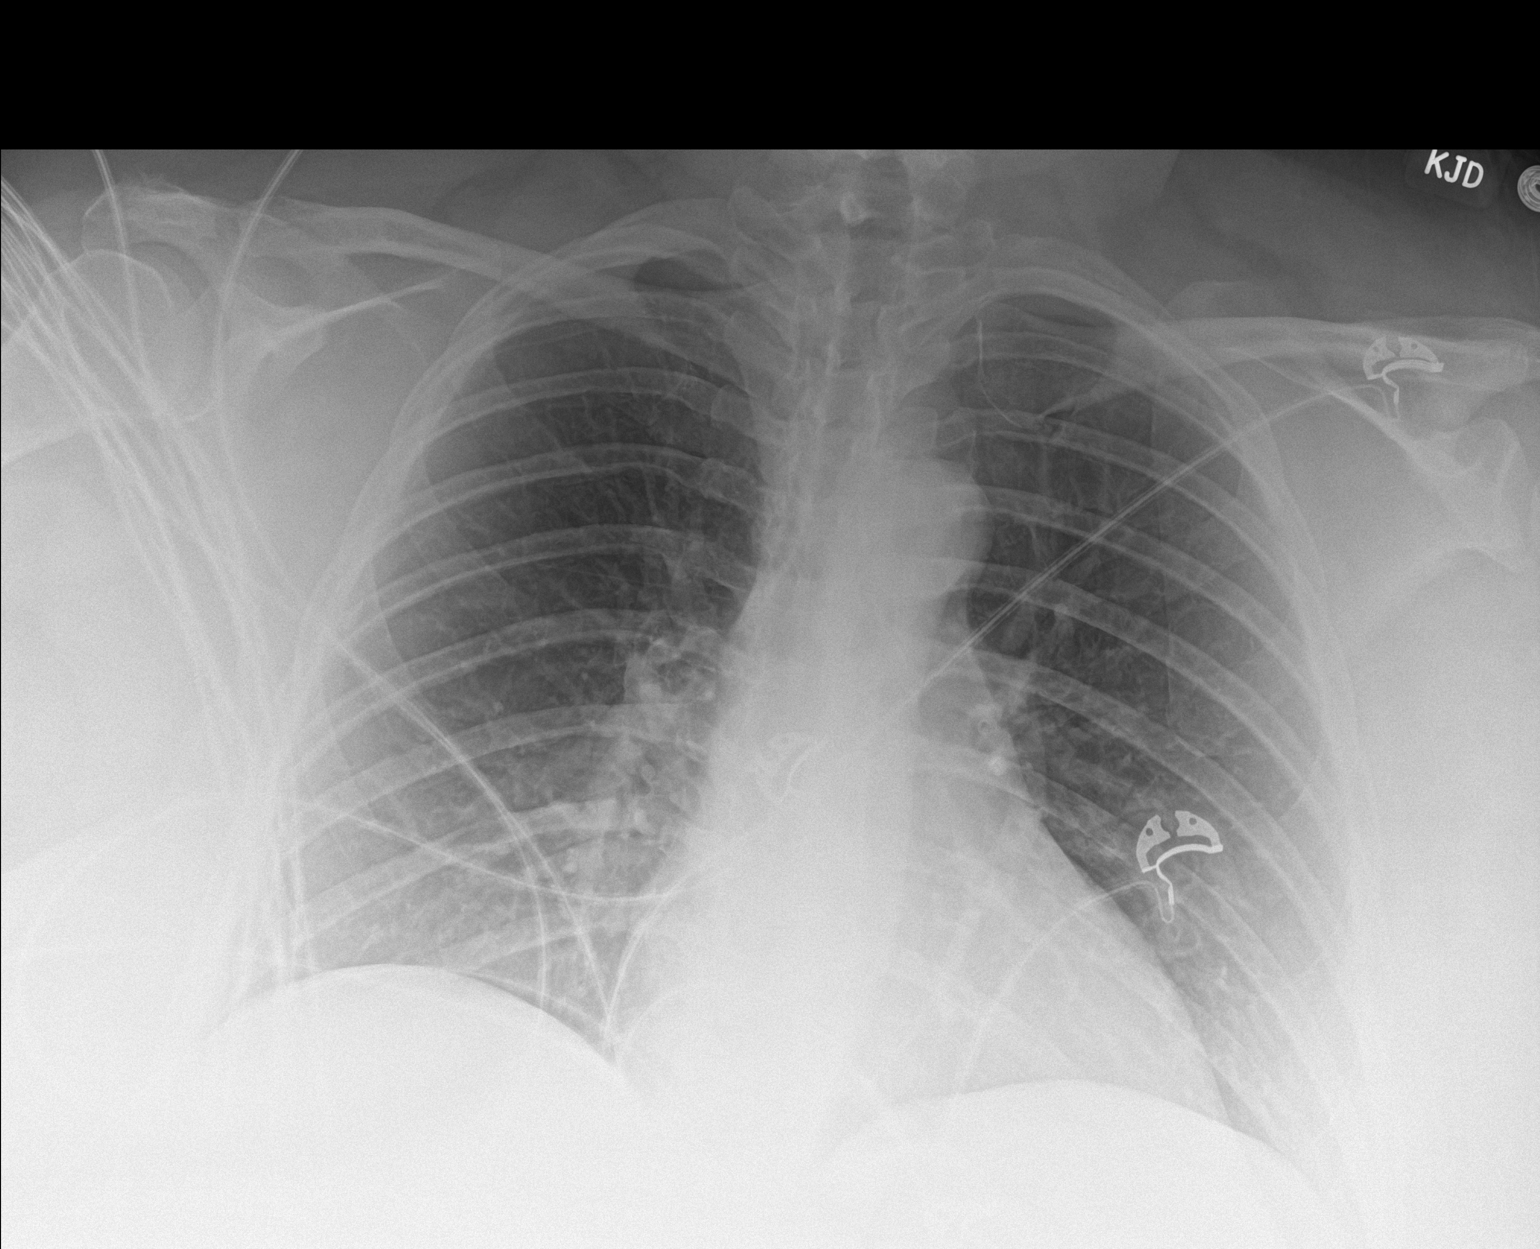

[1 of 1 positions shown; findings below may reference images not displayed]

FINDINGS: The heart size and mediastinal contours are within normal limits.
Both lungs are clear. The visualized skeletal structures are
unremarkable.
IMPRESSION: No active disease.

## 2020-09-15 ENCOUNTER — Other Ambulatory Visit: Payer: Self-pay | Admitting: Internal Medicine

## 2020-09-15 ENCOUNTER — Other Ambulatory Visit: Payer: Self-pay | Admitting: Nurse Practitioner

## 2020-09-18 ENCOUNTER — Encounter: Payer: Self-pay | Admitting: Gastroenterology

## 2020-09-18 ENCOUNTER — Other Ambulatory Visit: Payer: Self-pay

## 2020-09-18 ENCOUNTER — Ambulatory Visit: Payer: BC Managed Care – PPO | Admitting: Gastroenterology

## 2020-09-18 ENCOUNTER — Other Ambulatory Visit (INDEPENDENT_AMBULATORY_CARE_PROVIDER_SITE_OTHER): Payer: BC Managed Care – PPO

## 2020-09-18 ENCOUNTER — Encounter: Payer: Self-pay | Admitting: Pulmonary Disease

## 2020-09-18 ENCOUNTER — Ambulatory Visit (INDEPENDENT_AMBULATORY_CARE_PROVIDER_SITE_OTHER): Payer: BC Managed Care – PPO | Admitting: Pulmonary Disease

## 2020-09-18 ENCOUNTER — Telehealth: Payer: Self-pay | Admitting: Gastroenterology

## 2020-09-18 VITALS — BP 130/72 | HR 83 | Temp 97.3°F | Ht 64.0 in | Wt 398.8 lb

## 2020-09-18 DIAGNOSIS — K219 Gastro-esophageal reflux disease without esophagitis: Secondary | ICD-10-CM

## 2020-09-18 DIAGNOSIS — R1084 Generalized abdominal pain: Secondary | ICD-10-CM

## 2020-09-18 DIAGNOSIS — G4733 Obstructive sleep apnea (adult) (pediatric): Secondary | ICD-10-CM | POA: Diagnosis not present

## 2020-09-18 DIAGNOSIS — K5909 Other constipation: Secondary | ICD-10-CM

## 2020-09-18 LAB — CBC
HCT: 36.2 % (ref 36.0–46.0)
Hemoglobin: 12.4 g/dL (ref 12.0–15.0)
MCHC: 34.3 g/dL (ref 30.0–36.0)
MCV: 76.4 fl — ABNORMAL LOW (ref 78.0–100.0)
Platelets: 404 10*3/uL — ABNORMAL HIGH (ref 150.0–400.0)
RBC: 4.74 Mil/uL (ref 3.87–5.11)
RDW: 17 % — ABNORMAL HIGH (ref 11.5–15.5)
WBC: 9.7 10*3/uL (ref 4.0–10.5)

## 2020-09-18 MED ORDER — POLYETHYLENE GLYCOL 3350 17 GM/SCOOP PO POWD: ORAL | 1 refills | Status: DC

## 2020-09-18 MED ORDER — DICYCLOMINE HCL 10 MG PO CAPS: 10.0000 mg | ORAL_CAPSULE | Freq: Four times a day (QID) | ORAL | 3 refills | Status: DC | PRN

## 2020-09-18 MED ORDER — ZOLPIDEM TARTRATE 10 MG PO TABS: 10.0000 mg | ORAL_TABLET | Freq: Every evening | ORAL | 3 refills | Status: DC | PRN

## 2020-09-18 MED ORDER — OMEPRAZOLE 40 MG PO CPDR: 40.0000 mg | DELAYED_RELEASE_CAPSULE | Freq: Two times a day (BID) | ORAL | 3 refills | Status: DC

## 2020-09-18 MED ORDER — DICYCLOMINE HCL 10 MG PO CAPS: 10.0000 mg | ORAL_CAPSULE | ORAL | 3 refills | Status: DC | PRN
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
## 2020-09-18 NOTE — Telephone Encounter (Signed)
Emily Phelps from [facility] is requesting a call back with clarification on how to take the Bentyl.

## 2020-09-18 NOTE — Telephone Encounter (Signed)
Outpatient Medication Detail   Disp Refills Start End   dicyclomine (BENTYL) 10 MG capsule 30 capsule 3 09/18/2020 10/18/2020   Sig - Route: Take 1 capsule (10 mg total) by mouth 4 (four) times daily as needed for spasms. - Oral   Sent to pharmacy as: dicyclomine (BENTYL) 10 MG capsule   E-Prescribing Status: Sent to pharmacy (09/18/2020  4:50 PM EST)

## 2020-09-18 NOTE — Patient Instructions (Addendum)
It was a pleasure to see you today. Based on our discussion, I am providing you with my recommendations below:  RECOMMENDATION(S):   I am recommending a high fiber diet, to drink at least 1.5 - 2 liters of water, continue daily stool bulking agents with psyllium or methylcellulose  Continue efforts to achieve a healthy weight with a goal to lose 10 pounds this year  Continue Omeprazole and Bentyl as ordered below  I am providing you with samples of IBGuard for you to trial. If this medication helps to eliminate or resolve your symptoms, you may purchase over the counter.  To better evaluate your symptoms, I am recommending that you have labs completed today.  Please plan to follow up with me in 3-6 months.  LABS:   . Please proceed to the basement level for lab work before leaving today. Press "B" on the elevator. The lab is located at the first door on the left as you exit the elevator.  HEALTHCARE LAWS AND MY CHART RESULTS:   . Due to recent changes in healthcare laws, you may see results of your imaging and/or laboratory studies on MyChart before I have had a chance to review them.  I understand that in some cases there may be results that are confusing or concerning to you. Please understand that not all results are received at same time and often I may need to interpret multiple results in order to provide you with the best plan of care or course of treatment. Therefore, I ask that you please give me 48 hours to thoroughly review all your results before contacting my office for clarification.   PRESCRIPTION MEDICATION(S):   We have sent the following medication(s) to your pharmacy:  . Omeprazole - please continue 40mg  2 times daily . Bentyl - please take as needed for abdominal spasms  SAMPLES:   IBGuard - please take according to package instructions  NOTE: If your medication(s) requires a PRIOR AUTHORIZATION, we will receive notification from your pharmacy. Once received,  the process to submit for approval may take up to 7-10 business days. You will be contacted about any denials we have received from your insurance company as well as alternatives recommended by your provider.  FOLLOW UP:  . I would like for you to follow up with me in 3-6 months. Please call the office at [phone] to schedule your appointment.  BMI:  . If you are age 65 or younger, your body mass index should be between 19-25. Your Body mass index is 67.8 kg/m. If this is out of the aformentioned range listed, please consider follow up with your Primary Care Provider.   Thank you for trusting me with your gastrointestinal care!    Thornton Park, MD, MPH  High-Fiber Eating Plan Fiber, also called dietary fiber, is a type of carbohydrate. It is found foods such as fruits, vegetables, whole grains, and beans. A high-fiber diet can have many health benefits. Your health care provider may recommend a high-fiber diet to help:  Prevent constipation. Fiber can make your bowel movements more regular.  Lower your cholesterol.  Relieve the following conditions: ? Inflammation of veins in the anus (hemorrhoids). ? Inflammation of specific areas of the digestive tract (uncomplicated diverticulosis). ? A problem of the large intestine, also called the colon, that sometimes causes pain and diarrhea (irritable bowel syndrome, or IBS).  Prevent overeating as part of a weight-loss plan.  Prevent heart disease, type 2 diabetes, and certain cancers. What are  tips for following this plan? Reading food labels  Check the nutrition facts label on food products for the amount of dietary fiber. Choose foods that have 5 grams of fiber or more per serving.  The goals for recommended daily fiber intake include: ? Men (age 43 or younger): 34-38 g. ? Men (over age 63): 28-34 g. ? Women (age 69 or younger): 25-28 g. ? Women (over age 1): 22-25 g. Your daily fiber goal is _____________ g.    Shopping  Choose whole fruits and vegetables instead of processed forms, such as apple juice or applesauce.  Choose a wide variety of high-fiber foods such as avocados, lentils, oats, and kidney beans.  Read the nutrition facts label of the foods you choose. Be aware of foods with added fiber. These foods often have high sugar and sodium amounts per serving. Cooking  Use whole-grain flour for baking and cooking.  Cook with brown rice instead of white rice. Meal planning  Start the day with a breakfast that is high in fiber, such as a cereal that contains 5 g of fiber or more per serving.  Eat breads and cereals that are made with whole-grain flour instead of refined flour or white flour.  Eat brown rice, bulgur wheat, or millet instead of white rice.  Use beans in place of meat in soups, salads, and pasta dishes.  Be sure that half of the grains you eat each day are whole grains. General information  You can get the recommended daily intake of dietary fiber by: ? Eating a variety of fruits, vegetables, grains, nuts, and beans. ? Taking a fiber supplement if you are not able to take in enough fiber in your diet. It is better to get fiber through food than from a supplement.  Gradually increase how much fiber you consume. If you increase your intake of dietary fiber too quickly, you may have bloating, cramping, or gas.  Drink plenty of water to help you digest fiber.  Choose high-fiber snacks, such as berries, raw vegetables, nuts, and popcorn. What foods should I eat? Fruits Berries. Pears. Apples. Oranges. Avocado. Prunes and raisins. Dried figs. Vegetables Sweet potatoes. Spinach. Kale. Artichokes. Cabbage. Broccoli. Cauliflower. Green peas. Carrots. Squash. Grains Whole-grain breads. Multigrain cereal. Oats and oatmeal. Brown rice. Barley. Bulgur wheat. [place]. Quinoa. Bran muffins. Popcorn. Rye wafer crackers. Meats and other proteins Navy beans, kidney beans, and pinto  beans. Soybeans. Split peas. Lentils. Nuts and seeds. Dairy Fiber-fortified yogurt. Beverages Fiber-fortified soy milk. Fiber-fortified orange juice. Other foods Fiber bars. The items listed above may not be a complete list of recommended foods and beverages. Contact a dietitian for more information. What foods should I avoid? Fruits Fruit juice. Cooked, strained fruit. Vegetables Fried potatoes. Canned vegetables. Well-cooked vegetables. Grains White bread. Pasta made with refined flour. White rice. Meats and other proteins Fatty cuts of meat. Fried chicken or fried fish. Dairy Milk. Yogurt. Cream cheese. Sour cream. Fats and oils Butters. Beverages Soft drinks. Other foods Cakes and pastries. The items listed above may not be a complete list of foods and beverages to avoid. Talk with your dietitian about what choices are best for you. Summary  Fiber is a type of carbohydrate. It is found in foods such as fruits, vegetables, whole grains, and beans.  A high-fiber diet has many benefits. It can help to prevent constipation, lower blood cholesterol, aid weight loss, and reduce your risk of heart disease, diabetes, and certain cancers.  Increase your intake of fiber  gradually. Increasing fiber too quickly may cause cramping, bloating, and gas. Drink plenty of water while you increase the amount of fiber you consume.  The best sources of fiber include whole fruits and vegetables, whole grains, nuts, seeds, and beans. This information is not intended to replace advice given to you by your health care provider. Make sure you discuss any questions you have with your health care provider. Document Revised: 11/03/2019 Document Reviewed: 11/03/2019 Elsevier Patient Education  2021 [employer].  Exercising to Stay Healthy To become healthy and stay healthy, it is recommended that you do moderate-intensity and vigorous-intensity exercise. You can tell that you are exercising at a  moderate intensity if your heart starts beating faster and you start breathing faster but can still hold a conversation. You can tell that you are exercising at a vigorous intensity if you are breathing much harder and faster and cannot hold a conversation while exercising. Exercising regularly is important. It has many health benefits, such as:  Improving overall fitness, flexibility, and endurance.  Increasing bone density.  Helping with weight control.  Decreasing body fat.  Increasing muscle strength.  Reducing stress and tension.  Improving overall health. How often should I exercise? Choose an activity that you enjoy, and set realistic goals. Your health care provider can help you make an activity plan that works for you. Exercise regularly as told by your health care provider. This may include:  Doing strength training two times a week, such as: ? Lifting weights. ? Using resistance bands. ? Push-ups. ? Sit-ups. ? Yoga.  Doing a certain intensity of exercise for a given amount of time. Choose from these options: ? A total of 150 minutes of moderate-intensity exercise every week. ? A total of 75 minutes of vigorous-intensity exercise every week. ? A mix of moderate-intensity and vigorous-intensity exercise every week. Children, pregnant women, people who have not exercised regularly, people who are overweight, and older adults may need to talk with a health care provider about what activities are safe to do. If you have a medical condition, be sure to talk with your health care provider before you start a new exercise program. What are some exercise ideas? Moderate-intensity exercise ideas include:  Walking 1 mile (1.6 km) in about 15 minutes.  Biking.  Hiking.  Golfing.  Dancing.  Water aerobics. Vigorous-intensity exercise ideas include:  Walking 4.5 miles (7.2 km) or more in about 1 hour.  Jogging or running 5 miles (8 km) in about 1 hour.  Biking 10 miles  (16.1 km) or more in about 1 hour.  Lap swimming.  Roller-skating or in-line skating.  Cross-country skiing.  Vigorous competitive sports, such as football, basketball, and soccer.  Jumping rope.  Aerobic dancing.   What are some everyday activities that can help me to get exercise?  [place] work, such as: ? Pushing a Conservation officer, nature. ? Raking and bagging leaves.  Washing your car.  Pushing a stroller.  Shoveling snow.  Gardening.  Washing windows or floors. How can I be more active in my day-to-day activities?  Use stairs instead of an elevator.  Take a walk during your lunch break.  If you drive, park your car farther away from your work or school.  If you take public transportation, get off one stop early and walk the rest of the way.  Stand up or walk around during all of your indoor phone calls.  Get up, stretch, and walk around every 30 minutes throughout the day.  Enjoy exercise with a friend. Support to continue exercising will help you keep a regular routine of activity. What guidelines can I follow while exercising?  Before you start a new exercise program, talk with your health care provider.  Do not exercise so much that you hurt yourself, feel dizzy, or get very short of breath.  Wear comfortable clothes and wear shoes with good support.  Drink plenty of water while you exercise to prevent dehydration or heat stroke.  Work out until your breathing and your heartbeat get faster. Where to find more information  [place] Department of Health and Human Services: [employer]at  Centers for Disease Control and Prevention (CDC): http://www.wolf.info/ Summary  Exercising regularly is important. It will improve your overall fitness, flexibility, and endurance.  Regular exercise also will improve your overall health. It can help you control your weight, reduce stress, and improve your bone density.  Do not exercise so much that you hurt yourself, feel dizzy, or get very  short of breath.  Before you start a new exercise program, talk with your health care provider. This information is not intended to replace advice given to you by your health care provider. Make sure you discuss any questions you have with your health care provider. Document Revised: 06/12/2017 Document Reviewed: 05/21/2017 Elsevier Patient Education  2021 [employer].  Psyllium granules or powder for solution What is this medicine? PSYLLIUM (SIL i yum) is a bulk-forming fiber laxative. This medicine is used to treat constipation. Increasing fiber in the diet may also help lower cholesterol and promote heart health for some people. This medicine may be used for other purposes; ask your health care provider or pharmacist if you have questions. COMMON BRAND NAME(S): Fiber Therapy, GenFiber, Geri-Mucil, Hydrocil, Konsyl, Metamucil, Metamucil MultiHealth, Mucilin, Natural Fiber Therapy, Reguloid What should I tell my health care provider before I take this medicine? They need to know if you have any of these conditions:  blockage in your bowel  difficulty swallowing  inflammatory bowel disease  phenylketonuria  stomach or intestine problems  sudden change in bowel habits lasting more than 2 weeks  an unusual or allergic reaction to psyllium, other medicines, dyes, or preservatives  pregnant or trying or get pregnant  breast-feeding How should I use this medicine? Mix this medicine into a full glass (240 mL) of water or other cool drink. Take this medicine by mouth. Follow the directions on the package labeling, or take as directed by your health care professional. Take your medicine at regular intervals. Do not take your medicine more often than directed. Talk to your pediatrician regarding the use of this medicine in children. While this drug may be prescribed for children as young as 60 years old for selected conditions, precautions do apply. Overdosage: If you think you have taken  too much of this medicine contact a poison control center or emergency room at once. NOTE: This medicine is only for you. Do not share this medicine with others. What if I miss a dose? If you miss a dose, take it as soon as you can. If it is almost time for your next dose, take only that dose. Do not take double or extra doses. What may interact with this medicine? Interactions are not expected. Take this product at least 2 hours before or after other medicines. This list may not describe all possible interactions. Give your health care provider a list of all the medicines, herbs, non-prescription drugs, or dietary supplements you use. Also tell them  if you smoke, drink alcohol, or use illegal drugs. Some items may interact with your medicine. What should I watch for while using this medicine? Check with your doctor or health care professional if your symptoms do not start to get better or if they get worse. Stop using this medicine and contact your doctor or health care professional if you have rectal bleeding or if you have to treat your constipation for more than 1 week. These could be signs of a more serious condition. Drink several glasses of water a day while you are taking this medicine. This will help to relieve constipation and prevent dehydration. What side effects may I notice from receiving this medicine? Side effects that you should report to your doctor or health care professional as soon as possible:  allergic reactions like skin rash, itching or hives, swelling of the face, lips, or tongue  breathing problems  chest pain  nausea, vomiting  rectal bleeding  trouble swallowing Side effects that usually do not require medical attention (report to your doctor or health care professional if they continue or are bothersome):  bloating  gas  stomach cramps This list may not describe all possible side effects. Call your doctor for medical advice about side effects. You may  report side effects to FDA at 1-800-FDA-1088. Where should I keep my medicine? Keep out of the reach of children. Store at room temperature between 15 and 30 degrees C (59 and 86 degrees F). Protect from moisture. Throw away any unused medicine after the expiration date. NOTE: This sheet is a summary. It may not cover all possible information. If you have questions about this medicine, talk to your doctor, pharmacist, or health care provider.  2021 Elsevier/Gold Standard (2017-11-24 15:41:08)

## 2020-09-18 NOTE — Patient Instructions (Signed)
Sleep onset and sleep maintenance insomnia -Trial with Ambien  History of mild obstructive sleep apnea -I will have you repeat a home sleep study -I will recommend treatment for sleep apnea if positive  Encourage aggressive weight loss measures  Encourage regular exercises with focus on weight loss measures  I will see you back in 3 to 4 months  OSA Sleep Apnea Sleep apnea affects breathing during sleep. It causes breathing to stop for a short time or to become shallow. It can also increase the risk of:  Heart attack.  Stroke.  Being very overweight (obese).  Diabetes.  Heart failure.  Irregular heartbeat. The goal of treatment is to help you breathe normally again. What are the causes? There are three kinds of sleep apnea:  Obstructive sleep apnea. This is caused by a blocked or collapsed airway.  Central sleep apnea. This happens when the brain does not send the right signals to the muscles that control breathing.  Mixed sleep apnea. This is a combination of obstructive and central sleep apnea. The most common cause of this condition is a collapsed or blocked airway. This can happen if:  Your throat muscles are too relaxed.  Your tongue and tonsils are too large.  You are overweight.  Your airway is too small.   What increases the risk?  Being overweight.  Smoking.  Having a small airway.  Being older.  Being female.  Drinking alcohol.  Taking medicines to calm yourself (sedatives or tranquilizers).  Having family members with the condition. What are the signs or symptoms?  Trouble staying asleep.  Being sleepy or tired during the day.  Getting angry a lot.  Loud snoring.  Headaches in the morning.  Not being able to focus your mind (concentrate).  Forgetting things.  Less interest in sex.  Mood swings.  Personality changes.  Feelings of sadness (depression).  Waking up a lot during the night to pee (urinate).  Dry  mouth.  Sore throat. How is this diagnosed?  Your medical history.  A physical exam.  A test that is done when you are sleeping (sleep study). The test is most often done in a sleep lab but may also be done at home. How is this treated?  Sleeping on your side.  Using a medicine to get rid of mucus in your nose (decongestant).  Avoiding the use of alcohol, medicines to help you relax, or certain pain medicines (narcotics).  Losing weight, if needed.  Changing your diet.  Not smoking.  Using a machine to open your airway while you sleep, such as: ? An oral appliance. This is a mouthpiece that shifts your lower jaw forward. ? A CPAP device. This device blows air through a mask when you breathe out (exhale). ? An EPAP device. This has valves that you put in each nostril. ? A BPAP device. This device blows air through a mask when you breathe in (inhale) and breathe out.  Having surgery if other treatments do not work. It is important to get treatment for sleep apnea. Without treatment, it can lead to:  High blood pressure.  Coronary artery disease.  In men, not being able to have an erection (impotence).  Reduced thinking ability.   Follow these instructions at home: Lifestyle  Make changes that your doctor recommends.  Eat a healthy diet.  Lose weight if needed.  Avoid alcohol, medicines to help you relax, and some pain medicines.  Do not use any products that contain nicotine or tobacco,  such as cigarettes, e-cigarettes, and chewing tobacco. If you need help quitting, ask your doctor. General instructions  Take over-the-counter and prescription medicines only as told by your doctor.  If you were given a machine to use while you sleep, use it only as told by your doctor.  If you are having surgery, make sure to tell your doctor you have sleep apnea. You may need to bring your device with you.  Keep all follow-up visits as told by your doctor. This is  important. Contact a doctor if:  The machine that you were given to use during sleep bothers you or does not seem to be working.  You do not get better.  You get worse. Get help right away if:  Your chest hurts.  You have trouble breathing in enough air.  You have an uncomfortable feeling in your back, arms, or stomach.  You have trouble talking.  One side of your body feels weak.  A part of your face is hanging down. These symptoms may be an emergency. Do not wait to see if the symptoms will go away. Get medical help right away. Call your local emergency services (911 in the [place] Do not drive yourself to the hospital. Summary  This condition affects breathing during sleep.  The most common cause is a collapsed or blocked airway.  The goal of treatment is to help you breathe normally while you sleep. This information is not intended to replace advice given to you by your health care provider. Make sure you discuss any questions you have with your health care provider. Document Revised: 04/16/2018 Document Reviewed: 02/23/2018 Elsevier Patient Education  [place].
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
## 2020-09-19 LAB — PTH, INTACT AND CALCIUM
Calcium: 10.6 mg/dL — ABNORMAL HIGH (ref 8.6–10.2)
PTH: 29 pg/mL (ref 14–64)

## 2020-09-20 NOTE — Progress Notes (Signed)
I do see she is on HCTZ, I could definitely consider changing to spironolactone to start.

## 2020-09-24 ENCOUNTER — Other Ambulatory Visit: Payer: Self-pay | Admitting: Nurse Practitioner

## 2020-09-24 DIAGNOSIS — I10 Essential (primary) hypertension: Secondary | ICD-10-CM

## 2020-09-24 MED ORDER — SPIRONOLACTONE 25 MG PO TABS: 25.0000 mg | ORAL_TABLET | Freq: Every day | ORAL | 2 refills | Status: DC

## 2020-09-24 NOTE — Progress Notes (Signed)
Call patient to make aware I have changed her HCTZ to spironolactone and would like to have her to follow up in 4 weeks to do a medication check

## 2020-09-27 ENCOUNTER — Other Ambulatory Visit: Payer: Self-pay | Admitting: Nurse Practitioner

## 2020-09-27 ENCOUNTER — Other Ambulatory Visit: Payer: Self-pay | Admitting: Internal Medicine

## 2020-09-28 ENCOUNTER — Encounter: Payer: Self-pay | Admitting: Nurse Practitioner

## 2020-10-20 ENCOUNTER — Other Ambulatory Visit: Payer: Self-pay | Admitting: Nurse Practitioner

## 2020-10-22 ENCOUNTER — Ambulatory Visit: Payer: BC Managed Care – PPO

## 2020-10-22 ENCOUNTER — Other Ambulatory Visit: Payer: Self-pay

## 2020-10-22 DIAGNOSIS — G4733 Obstructive sleep apnea (adult) (pediatric): Secondary | ICD-10-CM | POA: Diagnosis not present

## 2020-10-23 ENCOUNTER — Encounter: Payer: Self-pay | Admitting: Nurse Practitioner

## 2020-10-23 ENCOUNTER — Ambulatory Visit: Payer: BC Managed Care – PPO | Admitting: Nurse Practitioner

## 2020-10-23 VITALS — BP 138/78 | HR 83 | Temp 98.4°F | Ht 64.2 in | Wt 392.2 lb

## 2020-10-23 DIAGNOSIS — G43809 Other migraine, not intractable, without status migrainosus: Secondary | ICD-10-CM

## 2020-10-23 DIAGNOSIS — Z794 Long term (current) use of insulin: Secondary | ICD-10-CM

## 2020-10-23 DIAGNOSIS — E782 Mixed hyperlipidemia: Secondary | ICD-10-CM

## 2020-10-23 DIAGNOSIS — E119 Type 2 diabetes mellitus without complications: Secondary | ICD-10-CM

## 2020-10-23 NOTE — Progress Notes (Signed)
I,Nuno Brubacher,acting as a Education administrator for [name], FNP.,have documented all relevant documentation on the behalf of [name], FNP,as directed by  [name], FNP while in the presence of [name], [place]. This visit occurred during the SARS-CoV-2 public health emergency.  Safety protocols were in place, including screening questions prior to the visit, additional usage of staff PPE, and extensive cleaning of exam room while observing appropriate contact time as indicated for disinfecting solutions.  Subjective:     Patient ID: Emily Phelps , female    DOB: 05/29/1981 , 40 y.o.   MRN: [id number]   Chief Complaint  Patient presents with  . Diabetes    HPI  Patient presents today for a dm f/u. She is taking Bentyl, IB gard and changing her diet. She is doing a program at work called Nurse, mental health for diabetes. Eating less carbs and smaller portions. She is tolerating Ozempic and tolerating well. She notices she is not able to eat as much as before. She is forcing herself to drink water instead and she is trying Keto snacks. She has cut back on rice. She says her family can tell she has lost weight. She has been doing rice, cauliflower and broccoli. She has been moving more.    Her insurance company is not covering brand Lantus.   She is needing a bathroom break. And she would like to remain at home for the next year due to covid and being in cubicles. She is also needing a FMLA form for her migraines as well will have migraines at least once a week.   Wt Readings from Last 3 Encounters: 10/23/20 : (!) 392 lb 3.2 oz (177.9 kg) 09/18/20 : (!) 395 lb (179.2 kg) 09/18/20 : (!) 398 lb 12.8 oz (180.9 kg)    Past Medical History:  Diagnosis Date  . Allergy   . Diabetes mellitus ([place])   . Family history of adverse reaction to anesthesia    mother had n/v after   . GERD (gastroesophageal reflux disease)   . Hyperlipidemia   . Hypertension   . Migraine   . Obesity   . Wears contact lenses       Family History  Problem Relation Age of Onset  . Hypertension Mother   . Colon polyps Mother   . Hypertension Brother   . Hypertension Maternal Grandmother   . Heart disease Maternal Grandmother   . Diabetes Father   . Hypertension Father   . Prostate cancer Father   . Thyroid disease Maternal Aunt   . Colon cancer Neg Hx   . Esophageal cancer Neg Hx   . Liver cancer Neg Hx   . Stomach cancer Neg Hx   . Rectal cancer Neg Hx      Current Outpatient Medications:  .  albuterol (VENTOLIN HFA) 108 (90 Base) MCG/ACT inhaler, Inhale 2 puffs into the lungs every 6 (six) hours as needed for wheezing or shortness of breath., Disp: 6.7 g, Rfl: 0 .  atenolol (TENORMIN) 50 MG tablet, TAKE 1 TABLET BY MOUTH EVERY DAY, Disp: 90 tablet, Rfl: 6 .  Continuous Blood Gluc Receiver ([place]) DEVI, Use to check blood sugars dx code e11.65, Disp: 3 each, Rfl: 3 .  Continuous Blood Gluc Sensor (DEXCOM G6 SENSOR) MISC, USE TO CHECK BLOOD SUGAR. CHANGE EVERY 10 DAYS, Disp: 3 each, Rfl: 0 .  Continuous Blood Gluc Transmit (DEXCOM G6 TRANSMITTER) MISC, Use to check blood sugars dx code e11.65, Disp: 3 each, Rfl: 3 .  CONTOUR NEXT TEST test strip, USE TO CHECK BLOOD SUGAR 3 TIMES DAILY AS DIRECTED, Disp: 200 strip, Rfl: 1 .  Cyanocobalamin (VITAMIN B12) 1000 MCG TBCR, Take 1,000 mcg by mouth daily. , Disp: , Rfl:  .  dicyclomine (BENTYL) 10 MG capsule, Take 1 capsule (10 mg total) by mouth 4 (four) times daily as needed for spasms., Disp: 30 capsule, Rfl: 3 .  [place] 610 MG CAPS, Take 1 capsule by mouth in the morning and at bedtime., Disp: , Rfl:  .  fexofenadine (ALLEGRA) 180 MG tablet, TAKE 1 TABLET BY MOUTH EVERY DAY, Disp: 30 tablet, Rfl: 1 .  fluticasone (FLONASE) 50 MCG/ACT nasal spray, Place 2 sprays into both nostrils daily as needed for allergies., Disp: , Rfl:  .  glimepiride (AMARYL) 1 MG tablet, TAKE 1 TABLET BY MOUTH EVERY DAY WITH BREAKFAST, Disp: 90 tablet, Rfl: 1 .   HYDROcodone-acetaminophen (NORCO/VICODIN) 5-325 MG tablet, Take 1 tablet by mouth every 4 (four) hours as needed., Disp: 10 tablet, Rfl: 0 .  hydrOXYzine (VISTARIL) 25 MG capsule, Take 1 capsule (25 mg total) by mouth 3 (three) times daily as needed., Disp: 30 capsule, Rfl: 0 .  ibuprofen (ADVIL) 200 MG tablet, Take 200 mg by mouth every 6 (six) hours as needed for moderate pain., Disp: , Rfl:  .  insulin glargine (LANTUS SOLOSTAR) 100 UNIT/ML Solostar Pen, INJECT 35 UNITS INTO THE SKIN 2 TIMES DAILY, Disp: 15 mL, Rfl: 5 .  Insulin Pen Needle (ULTICARE SHORT PEN NEEDLES) 31G X 8 MM MISC, AS DIRECTED 2 TIMES DAILY TO INJECT INSULIN, Disp: 100 each, Rfl: 3 .  losartan (COZAAR) 100 MG tablet, TAKE 1 TABLET BY MOUTH EVERY DAY, Disp: 90 tablet, Rfl: 1 .  Microlet Lancets MISC, CHECK BLOOD SUGAR LEVEL THREE TIMES DAILY AS DIRECTED, Disp: 200 each, Rfl: 1 .  Multiple Vitamin (MULTIVITAMIN WITH MINERALS) TABS tablet, Take 1 tablet by mouth daily., Disp: , Rfl:  .  norgestimate-ethinyl estradiol (PREVIFEM) 0.25-35 MG-MCG tablet, Take 1 tablet by mouth daily., Disp: 28 tablet, Rfl: 5 .  omeprazole (PRILOSEC) 40 MG capsule, Take 1 capsule (40 mg total) by mouth 2 (two) times daily., Disp: 180 capsule, Rfl: 3 .  ondansetron (ZOFRAN ODT) 4 MG disintegrating tablet, Take 1 tablet (4 mg total) by mouth every 8 (eight) hours as needed for nausea or vomiting., Disp: 20 tablet, Rfl: 0 .  polyethylene glycol powder (MIRALAX) 17 GM/SCOOP powder, Please take 1 capful dissolved in 8 oz of water prn constipation, Disp: 238 g, Rfl: 1 .  Semaglutide,0.25 or 0.5MG/DOS, (OZEMPIC, 0.25 OR 0.5 MG/DOSE,) 2 MG/1.5ML SOPN, Inject 0.5 mg into the skin once a week., Disp: 4.5 mL, Rfl: 1 .  spironolactone (ALDACTONE) 25 MG tablet, Take 1 tablet (25 mg total) by mouth daily., Disp: 30 tablet, Rfl: 2 .  SUMAtriptan (IMITREX) 50 MG tablet, Take 1 tablet (50 mg total) by mouth every 2 (two) hours as needed for migraine. May repeat in 2  hours if headache persists or recurs., Disp: 10 tablet, Rfl: 0 .  topiramate (TOPAMAX) 50 MG tablet, TAKE 1 TABLET BY MOUTH 2 TIMES DAILY, Disp: 180 tablet, Rfl: 1 .  VITAMIN D PO, Take 1 tablet by mouth daily. , Disp: , Rfl:  .  zolpidem (AMBIEN) 10 MG tablet, Take 1 tablet (10 mg total) by mouth at bedtime as needed for sleep., Disp: 30 tablet, Rfl: 3   Allergies  Allergen Reactions  . Tape Rash     Review of  Systems  Constitutional: Negative.   Respiratory: Negative.   Cardiovascular: Negative.  Negative for chest pain, palpitations and leg swelling.  Neurological: Negative for dizziness and headaches.  Psychiatric/Behavioral: Negative.      Today's Vitals   10/23/20 1036  BP: 138/78  Pulse: 83  Temp: 98.4 F (36.9 C)  TempSrc: Oral  Weight: (!) 392 lb 3.2 oz (177.9 kg)  Height: 5' 4.2" (1.631 m)  PainSc: 0-No pain   Body mass index is 66.9 kg/m.   Objective:  Physical Exam Vitals reviewed.  Constitutional:      General: She is not in acute distress.    Appearance: Normal appearance. She is obese.  Cardiovascular:     Rate and Rhythm: Normal rate and regular rhythm.     Pulses: Normal pulses.     Heart sounds: Normal heart sounds. No murmur heard.   Pulmonary:     Effort: Pulmonary effort is normal. No respiratory distress.     Breath sounds: Normal breath sounds. No wheezing.  Skin:    General: Skin is warm and dry.     Capillary Refill: Capillary refill takes less than 2 seconds.     Coloration: Skin is not jaundiced.  Neurological:     General: No focal deficit present.     Mental Status: She is alert and oriented to person, place, and time.     Cranial Nerves: No cranial nerve deficit.     Motor: No weakness.  Psychiatric:        Mood and Affect: Mood normal.        Behavior: Behavior normal.        Thought Content: Thought content normal.        Judgment: Judgment normal.         Assessment And Plan:     1. Controlled type 2 diabetes mellitus  without complication, with long-term current use of insulin (HCC)  Chronic, controlled  Continue with current medications, tolerating Ozempic well  Encouraged to limit intake of sugary foods and drinks  Encouraged to increase physical activity to 150 minutes per week as tolerated - Hemoglobin A1c - CMP14+EGFR - Lipid panel  2. Moderate mixed hyperlipidemia not requiring statin therapy  Chronic, stable Encouraged to eat a low fat diet - Lipid panel  3. Other migraine without status migrainosus, not intractable  Chronic, stable however reports having migraines at least once a week  She is concerned about how severely she had covid and is requesting to remain at home since her job is requiring people to return to work in person. She has left her FMLA form for completion  She is also seeing a therapist.     Patient was given opportunity to ask questions. Patient verbalized understanding of the plan and was able to repeat key elements of the plan. All questions were answered to their satisfaction.  [name], FNP   I, [name], FNP, have reviewed all documentation for this visit. The documentation on 10/23/20 for the exam, diagnosis, procedures, and orders are all accurate and complete.   IF YOU HAVE BEEN REFERRED TO A SPECIALIST, IT MAY TAKE 1-2 WEEKS TO SCHEDULE/PROCESS THE REFERRAL. IF YOU HAVE NOT HEARD FROM US/SPECIALIST IN TWO WEEKS, PLEASE GIVE [place] A CALL AT [phone] X [phone].   THE PATIENT IS ENCOURAGED TO PRACTICE SOCIAL DISTANCING DUE TO THE COVID-19 PANDEMIC.

## 2020-10-23 NOTE — Patient Instructions (Signed)
Diabetes Mellitus Basics  Diabetes mellitus, or diabetes, is a long-term (chronic) disease. It occurs when the body does not properly use sugar (glucose) that is released from food after you eat. Diabetes mellitus may be caused by one or both of these problems:  Your pancreas does not make enough of a hormone called insulin.  Your body does not react in a normal way to the insulin that it makes. Insulin lets glucose enter cells in your body. This gives you energy. If you have diabetes, glucose cannot get into cells. This causes high blood glucose (hyperglycemia). How to treat and manage diabetes You may need to take insulin or other diabetes medicines daily to keep your glucose in balance. If you are prescribed insulin, you will learn how to give yourself insulin by injection. You may need to adjust the amount of insulin you take based on the foods that you eat. You will need to check your blood glucose levels using a glucose monitor as told by your health care provider. The readings can help determine if you have low or high blood glucose. Generally, you should have these blood glucose levels:  Before meals (preprandial): 80-130 mg/dL (4.4-7.2 mmol/L).  After meals (postprandial): below 180 mg/dL (10 mmol/L).  Hemoglobin A1c (HbA1c) level: less than 7%. Your health care provider will set treatment goals for you. Keep all follow-up visits. This is important. Follow these instructions at home: Diabetes medicines Take your diabetes medicines every day as told by your health care provider. List your diabetes medicines here:  Name of medicine: ______________________________ ? Amount (dose): _______________ Time (a.m./p.m.): _______________ Notes: ___________________________________  Name of medicine: ______________________________ ? Amount (dose): _______________ Time (a.m./p.m.): _______________ Notes: ___________________________________  Name of medicine:  ______________________________ ? Amount (dose): _______________ Time (a.m./p.m.): _______________ Notes: ___________________________________ Insulin If you use insulin, list the types of insulin you use here:  Insulin type: ______________________________ ? Amount (dose): _______________ Time (a.m./p.m.): _______________Notes: ___________________________________  Insulin type: ______________________________ ? Amount (dose): _______________ Time (a.m./p.m.): _______________ Notes: ___________________________________  Insulin type: ______________________________ ? Amount (dose): _______________ Time (a.m./p.m.): _______________ Notes: ___________________________________  Insulin type: ______________________________ ? Amount (dose): _______________ Time (a.m./p.m.): _______________ Notes: ___________________________________  Insulin type: ______________________________ ? Amount (dose): _______________ Time (a.m./p.m.): _______________ Notes: ___________________________________ Managing blood glucose Check your blood glucose levels using a glucose monitor as told by your health care provider. Write down the times that you check your glucose levels here:  Time: _______________ Notes: ___________________________________  Time: _______________ Notes: ___________________________________  Time: _______________ Notes: ___________________________________  Time: _______________ Notes: ___________________________________  Time: _______________ Notes: ___________________________________  Time: _______________ Notes: ___________________________________   Low blood glucose Low blood glucose (hypoglycemia) is when glucose is at or below 70 mg/dL (3.9 mmol/L). Symptoms may include:  Feeling: ? Hungry. ? Sweaty and clammy. ? Irritable or easily upset. ? Dizzy. ? Sleepy.  Having: ? A fast heartbeat. ? A headache. ? A change in your vision. ? Numbness around the mouth, lips, or  tongue.  Having trouble with: ? Moving (coordination). ? Sleeping. Treating low blood glucose To treat low blood glucose, eat or drink something containing sugar right away. If you can think clearly and swallow safely, follow the 15:15 rule:  Take 15 grams of a fast-acting carb (carbohydrate), as told by your health care provider.  Some fast-acting carbs are: ? Glucose tablets: take 3-4 tablets. ? Hard candy: eat 3-5 pieces. ? Fruit juice: drink 4 oz (120 mL). ? Regular (not diet) soda: drink 4-6 oz (120-180 mL). ? Honey or sugar:   eat 1 Tbsp (15 mL).  Check your blood glucose levels 15 minutes after you take the carb.  If your glucose is still at or below 70 mg/dL (3.9 mmol/L), take 15 grams of a carb again.  If your glucose does not go above 70 mg/dL (3.9 mmol/L) after 3 tries, get help right away.  After your glucose goes back to normal, eat a meal or a snack within 1 hour. Treating very low blood glucose If your glucose is at or below 54 mg/dL (3 mmol/L), you have very low blood glucose (severe hypoglycemia). This is an emergency. Do not wait to see if the symptoms will go away. Get medical help right away. Call your local emergency services (911 in the [place] Do not drive yourself to the hospital. Questions to ask your health care provider  Should I talk with a diabetes educator?  What equipment will I need to care for myself at home?  What diabetes medicines do I need? When should I take them?  How often do I need to check my blood glucose levels?  What number can I call if I have questions?  When is my follow-up visit?  Where can I find a support group for people with diabetes? Where to find more information  American Diabetes Association: www.diabetes.org  Association of Diabetes Care and Education Specialists: www.diabeteseducator.org Contact a health care provider if:  Your blood glucose is at or above 240 mg/dL (13.3 mmol/L) for 2 days in a row.  You have  been sick or have had a fever for 2 days or more, and you are not getting better.  You have any of these problems for more than 6 hours: ? You cannot eat or drink. ? You feel nauseous. ? You vomit. ? You have diarrhea. Get help right away if:  Your blood glucose is lower than 54 mg/dL (3 mmol/L).  You get confused.  You have trouble thinking clearly.  You have trouble breathing. These symptoms may represent a serious problem that is an emergency. Do not wait to see if the symptoms will go away. Get medical help right away. Call your local emergency services (911 in the [place] Do not drive yourself to the hospital. Summary  Diabetes mellitus is a chronic disease that occurs when the body does not properly use sugar (glucose) that is released from food after you eat.  Take insulin and diabetes medicines as told.  Check your blood glucose every day, as often as told.  Keep all follow-up visits. This is important. This information is not intended to replace advice given to you by your health care provider. Make sure you discuss any questions you have with your health care provider. Document Revised: 11/01/2019 Document Reviewed: 11/01/2019 Elsevier Patient Education  2021 Elsevier Inc.  

## 2020-10-24 LAB — CMP14+EGFR
ALT: 26 IU/L (ref 0–32)
AST: 13 IU/L (ref 0–40)
Albumin/Globulin Ratio: 1.7 (ref 1.2–2.2)
Albumin: 4.5 g/dL (ref 3.8–4.8)
Alkaline Phosphatase: 116 IU/L (ref 44–121)
BUN/Creatinine Ratio: 13 (ref 9–23)
BUN: 10 mg/dL (ref 6–20)
Bilirubin Total: 0.5 mg/dL (ref 0.0–1.2)
CO2: 17 mmol/L — ABNORMAL LOW (ref 20–29)
Calcium: 10.5 mg/dL — ABNORMAL HIGH (ref 8.7–10.2)
Chloride: 103 mmol/L (ref 96–106)
Creatinine, Ser: 0.75 mg/dL (ref 0.57–1.00)
Globulin, Total: 2.7 g/dL (ref 1.5–4.5)
Glucose: 96 mg/dL (ref 65–99)
Potassium: 4.1 mmol/L (ref 3.5–5.2)
Sodium: 140 mmol/L (ref 134–144)
Total Protein: 7.2 g/dL (ref 6.0–8.5)
eGFR: 105 mL/min/{1.73_m2} (ref >59)

## 2020-10-24 LAB — LIPID PANEL
Chol/HDL Ratio: 4.4 ratio (ref 0.0–4.4)
Cholesterol, Total: 184 mg/dL (ref 100–199)
HDL: 42 mg/dL (ref >39)
LDL Chol Calc (NIH): 117 mg/dL — ABNORMAL HIGH (ref 0–99)
Triglycerides: 141 mg/dL (ref 0–149)
VLDL Cholesterol Cal: 25 mg/dL (ref 5–40)

## 2020-10-30 ENCOUNTER — Ambulatory Visit: Payer: BC Managed Care – PPO | Admitting: Nurse Practitioner

## 2020-10-31 ENCOUNTER — Telehealth: Payer: Self-pay | Admitting: Pulmonary Disease

## 2020-10-31 DIAGNOSIS — G4733 Obstructive sleep apnea (adult) (pediatric): Secondary | ICD-10-CM | POA: Diagnosis not present

## 2020-10-31 NOTE — Telephone Encounter (Signed)
Call patient  Sleep study result  Date of study: 10/23/2020  Impression: Mild obstructive sleep apnea Mild oxygen desaturations  Recommendation: Options of treatment for mild obstructive sleep apnea will include 1.  CPAP therapy may be considered if patient has significant daytime sleepiness -If CPAP is chosen as an option of treatment, auto titrating CPAP with pressure settings of 5-15 will be appropriate  2.  Watchful waiting with focus on weight loss efforts and sleep position modification may also be appropriate if asymptomatic  3.  An oral device may be considered as an option of treatment for mild sleep disordered breathing   Follow-up as previously scheduled

## 2020-11-01 NOTE — Telephone Encounter (Signed)
I spoke with the pt and notified of recs per Dr Ander Slade  Pt verbalized understanding  She prefers to go with weight loss and has already started working on this  Will call if she changes her mind or has issues with continued wt loss

## 2020-11-01 NOTE — Telephone Encounter (Signed)
Left message for patient to return call.

## 2020-11-14 ENCOUNTER — Telehealth: Payer: Self-pay

## 2020-11-14 NOTE — Telephone Encounter (Signed)
I called patient to notify her that her form have been completed she just needs to fill her part out. She stated she will come on Monday to fill them out [facility]

## 2020-11-17 ENCOUNTER — Other Ambulatory Visit: Payer: Self-pay | Admitting: Nurse Practitioner

## 2020-11-17 DIAGNOSIS — G43009 Migraine without aura, not intractable, without status migrainosus: Secondary | ICD-10-CM | POA: Diagnosis not present

## 2020-11-20 DIAGNOSIS — I10 Essential (primary) hypertension: Secondary | ICD-10-CM | POA: Diagnosis not present

## 2020-11-20 DIAGNOSIS — Z6841 Body Mass Index (BMI) 40.0 and over, adult: Secondary | ICD-10-CM | POA: Diagnosis not present

## 2020-11-20 DIAGNOSIS — Z01419 Encounter for gynecological examination (general) (routine) without abnormal findings: Secondary | ICD-10-CM | POA: Diagnosis not present

## 2020-11-20 LAB — HM PAP SMEAR

## 2020-11-27 ENCOUNTER — Other Ambulatory Visit: Payer: Self-pay | Admitting: Nurse Practitioner

## 2020-11-30 ENCOUNTER — Other Ambulatory Visit: Payer: Self-pay | Admitting: Nurse Practitioner

## 2020-11-30 DIAGNOSIS — I10 Essential (primary) hypertension: Secondary | ICD-10-CM

## 2020-12-17 ENCOUNTER — Other Ambulatory Visit: Payer: Self-pay | Admitting: Nurse Practitioner

## 2021-01-22 ENCOUNTER — Other Ambulatory Visit: Payer: Self-pay | Admitting: Nurse Practitioner

## 2021-01-22 DIAGNOSIS — I10 Essential (primary) hypertension: Secondary | ICD-10-CM

## 2021-01-25 ENCOUNTER — Other Ambulatory Visit: Payer: Self-pay

## 2021-01-25 ENCOUNTER — Encounter: Payer: Self-pay | Admitting: Family Medicine

## 2021-01-25 ENCOUNTER — Ambulatory Visit: Payer: Self-pay | Admitting: Family Medicine

## 2021-01-25 DIAGNOSIS — N760 Acute vaginitis: Secondary | ICD-10-CM

## 2021-01-25 DIAGNOSIS — B9689 Other specified bacterial agents as the cause of diseases classified elsewhere: Secondary | ICD-10-CM

## 2021-01-25 DIAGNOSIS — Z113 Encounter for screening for infections with a predominantly sexual mode of transmission: Secondary | ICD-10-CM

## 2021-01-25 LAB — WET PREP FOR TRICH, YEAST, CLUE
Trichomonas Exam: NEGATIVE
Yeast Exam: NEGATIVE

## 2021-01-25 MED ORDER — METRONIDAZOLE 500 MG PO TABS: 500.0000 mg | ORAL_TABLET | Freq: Two times a day (BID) | ORAL | 0 refills | Status: AC

## 2021-01-25 NOTE — Progress Notes (Signed)
[facility] Department STI clinic/screening visit  Subjective:  Emily Phelps is a 40 y.o. female being seen today for an STI screening visit. The patient reports they do not have symptoms.  Patient reports that they do not desire a pregnancy in the next year.   They reported they are not interested in discussing contraception today.  No LMP recorded. (Menstrual status: Oral contraceptives).   Patient has the following medical conditions:   Patient Active Problem List   Diagnosis Date Noted   Morbid obesity with BMI of 60.0-69.9, adult ([place]) 01/04/2020   Legionella pneumonia ([place]) 01/03/2020   OSA (obstructive sleep apnea) 10/08/2019   Rectal bleeding    Controlled type 2 diabetes mellitus without complication, with long-term current use of insulin ([place]) 05/20/2019   Numbness and tingling in left hand 05/20/2019   Hypokalemia    Weight loss 03/11/2019   Dyspnea 03/11/2019   Panic anxiety syndrome 03/03/2019   Neck pain 01/07/2019   Routine general medical examination at a health care facility 09/24/2018   Dysfunctional uterine bleeding 11/28/2014   Essential hypertension 11/28/2014   GERD (gastroesophageal reflux disease) 11/28/2014   Migraines 11/28/2014   Obesity, Class III, BMI 40-49.9 (morbid obesity) ([place]) 11/28/2014   Hyperlipidemia 11/28/2014   Allergic rhinitis 11/28/2014    Chief Complaint  Patient presents with   SEXUALLY TRANSMITTED DISEASE    Screening     HPI  Patient reports here for screening, denies s/sx   Last HIV test per patient/review of record was 03/12/19 Patient reports last pap was about 3 yrs ago.   See flowsheet for further details and programmatic requirements.    The following portions of the patient's history were reviewed and updated as appropriate: allergies, current medications, past medical history, past social history, past surgical history and problem list.  Objective:  There were no vitals filed for this  visit.  Physical Exam Vitals and nursing note reviewed.  Constitutional:      Appearance: Normal appearance.  HENT:     Head: Normocephalic and atraumatic.     Mouth/Throat:     Mouth: Mucous membranes are moist.     Pharynx: Oropharynx is clear. No oropharyngeal exudate or posterior oropharyngeal erythema.  Pulmonary:     Effort: Pulmonary effort is normal.  Chest:  Breasts:    Right: No axillary adenopathy or supraclavicular adenopathy.     Left: No axillary adenopathy or supraclavicular adenopathy.  Abdominal:     General: Abdomen is flat.     Palpations: There is no mass.     Tenderness: There is no abdominal tenderness. There is no rebound.  Genitourinary:    General: Normal vulva.     Exam position: Lithotomy position.     Pubic Area: No rash or pubic lice.      Labia:        Right: No rash or lesion.        Left: No rash or lesion.      Vagina: Normal. No vaginal discharge, erythema, bleeding or lesions.     Cervix: No cervical motion tenderness, discharge, friability, lesion or erythema.     Uterus: Normal.      Adnexa: Right adnexa normal and left adnexa normal.     Rectum: Normal.     Comments: External genitalia without, lice, nits, erythema, edema , lesions or inguinal adenopathy. Vagina with normal mucosa and discharge and pH equals 4.  Cervix without visual lesions, uterus firm, mobile, non-tender, no masses, CMT adnexal fullness  or tenderness.   Lymphadenopathy:     Head:     Right side of head: No preauricular or posterior auricular adenopathy.     Left side of head: No preauricular or posterior auricular adenopathy.     Cervical: No cervical adenopathy.     Upper Body:     Right upper body: No supraclavicular or axillary adenopathy.     Left upper body: No supraclavicular or axillary adenopathy.     Lower Body: No right inguinal adenopathy. No left inguinal adenopathy.  Skin:    General: Skin is warm and dry.     Findings: No rash.  Neurological:      Mental Status: She is alert and oriented to person, place, and time.     Assessment and Plan:  Emily Phelps is a 40 y.o. female presenting to the [facility] Department for STI screening  1. Screening examination for venereal disease  - HBV Antigen/Antibody [facility] - HIV/HCV [place] [facility] - Syphilis Serology, [place] [facility] - WET PREP FOR [place], YEAST, [place] [facility]  Patient accepted all screenings including wet prep, oral, vaginal CT/GC and bloodwork for HIV/RPR.  Patient meets criteria for HepB screening? Yes. Ordered? Yes Patient meets criteria for HepC screening? Yes. Ordered? Yes   2. Bacterial vaginosis  - metroNIDAZOLE (FLAGYL) 500 MG tablet; Take 1 tablet (500 mg total) by mouth 2 (two) times daily for 7 days.  Dispense: 14 tablet; Refill: 0   RN: Treat wet prep for  v+ BV  Discussed time line for State Lab results and that patient will be called with positive results and encouraged patient to call if she had not heard in 2 weeks.  Counseled to return or seek care for continued or worsening symptoms Recommended condom use with all sex  Patient is currently using Hormonal Contraception: Injection, Rings and Patches to prevent pregnancy.    No follow-ups on file.  Future Appointments  Date Time Provider [place]  02/12/2021  9:00 AM [name], FNP [facility] None  02/25/2021 10:30 AM Thornton Park, MD [facility]  03/19/2021  8:30 AM Skeet Latch, MD [facility]    Junious Dresser, FNP

## 2021-01-25 NOTE — Progress Notes (Signed)
Pt here for STD screening.  Wet mount results reviewed, medication dispensed per SO.  Pt declined condoms. Windle Guard, RN

## 2021-01-30 LAB — HM HEPATITIS C SCREENING LAB: HM Hepatitis Screen: NEGATIVE

## 2021-01-30 LAB — HEPATITIS B SURFACE ANTIGEN

## 2021-01-30 LAB — HM HIV SCREENING LAB: HM HIV Screening: NEGATIVE

## 2021-02-12 ENCOUNTER — Other Ambulatory Visit: Payer: Self-pay

## 2021-02-12 ENCOUNTER — Ambulatory Visit (INDEPENDENT_AMBULATORY_CARE_PROVIDER_SITE_OTHER): Payer: BC Managed Care – PPO | Admitting: Nurse Practitioner

## 2021-02-12 ENCOUNTER — Encounter: Payer: Self-pay | Admitting: Nurse Practitioner

## 2021-02-12 VITALS — BP 122/86 | HR 84 | Temp 97.9°F | Ht 64.0 in | Wt 386.8 lb

## 2021-02-12 DIAGNOSIS — E119 Type 2 diabetes mellitus without complications: Secondary | ICD-10-CM | POA: Diagnosis not present

## 2021-02-12 DIAGNOSIS — N951 Menopausal and female climacteric states: Secondary | ICD-10-CM | POA: Diagnosis not present

## 2021-02-12 DIAGNOSIS — I10 Essential (primary) hypertension: Secondary | ICD-10-CM | POA: Diagnosis not present

## 2021-02-12 DIAGNOSIS — E7800 Pure hypercholesterolemia, unspecified: Secondary | ICD-10-CM | POA: Diagnosis not present

## 2021-02-12 DIAGNOSIS — Z6841 Body Mass Index (BMI) 40.0 and over, adult: Secondary | ICD-10-CM

## 2021-02-12 DIAGNOSIS — Z794 Long term (current) use of insulin: Secondary | ICD-10-CM

## 2021-02-12 DIAGNOSIS — G43809 Other migraine, not intractable, without status migrainosus: Secondary | ICD-10-CM

## 2021-02-12 DIAGNOSIS — D649 Anemia, unspecified: Secondary | ICD-10-CM | POA: Diagnosis not present

## 2021-02-12 DIAGNOSIS — R635 Abnormal weight gain: Secondary | ICD-10-CM | POA: Diagnosis not present

## 2021-02-12 DIAGNOSIS — E559 Vitamin D deficiency, unspecified: Secondary | ICD-10-CM | POA: Diagnosis not present

## 2021-02-12 DIAGNOSIS — R5383 Other fatigue: Secondary | ICD-10-CM | POA: Diagnosis not present

## 2021-02-12 DIAGNOSIS — E782 Mixed hyperlipidemia: Secondary | ICD-10-CM | POA: Diagnosis not present

## 2021-02-12 LAB — POCT UA - MICROALBUMIN
Albumin/Creatinine Ratio, Urine, POC: <30
Creatinine, POC: 10 mg/dL
Microalbumin Ur, POC: 10 mg/L

## 2021-02-12 MED ORDER — OZEMPIC (1 MG/DOSE) 4 MG/3ML ~~LOC~~ SOPN: 1.0000 mg | PEN_INJECTOR | SUBCUTANEOUS | 1 refills | Status: DC

## 2021-02-12 MED ORDER — DAPAGLIFLOZIN PROPANEDIOL 5 MG PO TABS: 5.0000 mg | ORAL_TABLET | Freq: Every day | ORAL | 1 refills | Status: DC

## 2021-02-12 NOTE — Patient Instructions (Signed)
Type 2 Diabetes Mellitus, Diagnosis, Adult Type 2 diabetes (type 2 diabetes mellitus) is a long-term disease. It may happen when there is one or both of these problems: The pancreas does not make enough insulin. The body does not react in a normal way to insulin that it makes. Insulin lets sugars go into cells in your body. If you have type 2 diabetes, sugars cannot get into your cells. Sugars build up in the blood. This causeshigh blood sugar. What are the causes? The exact cause of this condition is not known. What increases the risk? The following factors may make you more likely to develop this condition: Having type 2 diabetes in your family. Being overweight or very overweight. Not being active. Your body not reacting in a normal way to the insulin it makes. Having higher than normal blood sugar over time. Having a type of diabetes when you were pregnant. Having a condition that causes small fluid-filled sacs on your ovaries. What are the signs or symptoms? At first, you may have no symptoms. You will get symptoms slowly. They may include: More thirst than normal. More hunger than normal. Needing to pee more than normal. Losing weight without trying. Feeling tired. Feeling weak. Seeing things blurry. Dark patches on your skin. How is this treated? This condition may be treated by a diabetes expert. You may need to: Follow an eating plan made by a food expert (dietitian). Get regular exercise. Find ways to deal with stress. Check blood sugar as often as told. Take medicines. Your doctor will set treatment goals for you. Your blood sugar should be at these levels: Before meals: 80-130 mg/dL (4.4-7.2 mmol/L). After meals: below 180 mg/dL (10 mmol/L). Over the last 2-3 months: less than 7%. Follow these instructions at home: Medicines Take your diabetes medicines or insulin every day. Take medicines to help you not get other problems caused by this condition. You may  need: Aspirin. Medicine to lower cholesterol. Medicine to control blood pressure. Questions to ask your doctor Should I meet with a diabetes educator? What medicines do I need, and when should I take them? What will I need to treat my condition at home? When should I check my blood sugar? Where can I find a support group? Who can I call if I have questions? When is my next doctor visit? General instructions Take over-the-counter and prescription medicines only as told by your doctor. Keep all follow-up visits as told by your doctor. This is important. Where to find more information American Diabetes Association (ADA): www.diabetes.org American Association of Diabetes Care and Education Specialists (ADCES): www.diabeteseducator.org International Diabetes Federation (IDF): MemberVerification.ca Contact a doctor if: Your blood sugar is at or above 240 mg/dL (13.3 mmol/L) for 2 days in a row. You have been sick for 2 days or more, and you are not getting better. You have had a fever for 2 days or more, and you are not getting better. You have any of these problems for more than 6 hours: You cannot eat or drink. You feel like you may vomit. You vomit. You have watery poop (diarrhea). Get help right away if: Your blood sugar is very low. This means it is lower than 54 mg/dL (3 mmol/L). You feel mixed up (confused). You have trouble thinking clearly. You have trouble breathing. You have medium or large ketone levels in your pee. These symptoms may be an emergency. Do not wait to see if the symptoms will go away. Get medical help right away. Call  your local emergency services (911 in the [place] Do not drive yourself to the hospital. Summary Type 2 diabetes is a long-term disease. Your pancreas may not make enough insulin, or your body may not react in a normal way to insulin that it makes. This condition is treated with an eating plan, lifestyle changes, and medicines. Your doctor will set  treatment goals for you. These will help you keep your blood sugar in a healthy range. Keep all follow-up visits as told by your doctor. This is important. This information is not intended to replace advice given to you by your health care provider. Make sure you discuss any questions you have with your healthcare provider. Document Revised: 06/13/2020 Document Reviewed: 01/25/2020 Elsevier Patient Education  [employer].

## 2021-02-12 NOTE — Addendum Note (Signed)
Addended by: Michelle Nasuti on: 02/12/2021 11:46 AM   Modules accepted: Orders

## 2021-02-12 NOTE — Progress Notes (Signed)
I,Conita Amenta,acting as a Education administrator for [name], FNP.,have documented all relevant documentation on the behalf of [name], FNP,as directed by  [name], FNP while in the presence of [name], [place].  This visit occurred during the SARS-CoV-2 public health emergency.  Safety protocols were in place, including screening questions prior to the visit, additional usage of staff PPE, and extensive cleaning of exam room while observing appropriate contact time as indicated for disinfecting solutions.  Subjective:     Patient ID: Emily Phelps , female    DOB: 01-01-1981 , 40 y.o.   MRN: [id number]   Chief Complaint  Patient presents with   Diabetes     HPI  Patient presents today for a dm f/u. She continues to be followed by her pulmonary doctor, she states "she is not a fan of the doctor" interested in discussing going to a new provider.  Pt noticed, a change with side effects along with change of medication dealing with her BP, pt experienced bad migraines and vomiting, she rated migraine at 10/10 at the time having to go to urgent care.  She has concerns about her chol.    She is doing [place] through her job since February - for weight loss with a goal to stop her insulin. She has been doing a keto diet but has not been working for her. She has eliminated several foods as well. She is trying to walk in the mornings but not as consistent.  She puts cheese on her salad.  She is not taking Lantus daily will only take when her insulin when her blood sugar is elevated. She is going to [facility] today for a consultation.   Wt Readings from Last 3 Encounters: 02/12/21 : (!) 386 lb 12.8 oz (175.5 kg) 10/23/20 : (!) 392 lb 3.2 oz (177.9 kg) 09/18/20 : (!) 395 lb (179.2 kg)  Headaches have decreased from daily in May to 2 times a week. Around that time was when she thinks her blood pressure was up.     Diabetes She presents for her follow-up diabetic visit. She has type 2 diabetes  mellitus. Hypoglycemia symptoms include headaches. There are no diabetic associated symptoms. There are no hypoglycemic complications. There are no diabetic complications. Risk factors for coronary artery disease include obesity and sedentary lifestyle. Current diabetic treatment includes oral agent (dual therapy). She is compliant with treatment all of the time. Diabetic current diet: she has been doing a keto diet. When asked about meal planning, she reported none. She has not had a previous visit with a dietitian. An ACE inhibitor/angiotensin II receptor blocker is being taken. She does not see a podiatrist.Eye exam is not current.  Headache  This is a chronic problem. The current episode started more than 1 year ago. The problem occurs intermittently (having at least 4 times a week). The pain is located in the Temporal (temples and behind her eyes) region. The quality of the pain is described as aching. Pertinent negatives include no abdominal pain. She has tried nothing for the symptoms.    Past Medical History:  Diagnosis Date   Allergy    Diabetes mellitus ([place])    Family history of adverse reaction to anesthesia    mother had n/v after    GERD (gastroesophageal reflux disease)    Hyperlipidemia    Hypertension    Migraine    Obesity    Wears contact lenses      Family History  Problem Relation Age of  Onset   Hypertension Mother    Colon polyps Mother    Hypertension Brother    Hypertension Maternal Grandmother    Heart disease Maternal Grandmother    Diabetes Father    Hypertension Father    Prostate cancer Father    Thyroid disease Maternal Aunt    Colon cancer Neg Hx    Esophageal cancer Neg Hx    Liver cancer Neg Hx    Stomach cancer Neg Hx    Rectal cancer Neg Hx      Current Outpatient Medications:    albuterol (VENTOLIN HFA) 108 (90 Base) MCG/ACT inhaler, Inhale 2 puffs into the lungs every 6 (six) hours as needed for wheezing or shortness of breath., Disp: 6.7 g,  Rfl: 0   atenolol (TENORMIN) 50 MG tablet, TAKE 1 TABLET BY MOUTH EVERY DAY, Disp: 90 tablet, Rfl: 6   Continuous Blood Gluc Receiver (DEXCOM G6 RECEIVER) DEVI, Use to check blood sugars dx code e11.65, Disp: 3 each, Rfl: 3   Continuous Blood Gluc Sensor (DEXCOM G6 SENSOR) MISC, USE TO CHECK BLOOD SUGAR. CHANGE EVERY 10 DAYS, Disp: 3 each, Rfl: 0   Cyanocobalamin (VITAMIN B12) 1000 MCG TBCR, Take 1,000 mcg by mouth daily. , Disp: , Rfl:    dapagliflozin propanediol (FARXIGA) 5 MG TABS tablet, Take 1 tablet (5 mg total) by mouth daily before breakfast., Disp: 90 tablet, Rfl: 1   fexofenadine (ALLEGRA) 180 MG tablet, TAKE 1 TABLET BY MOUTH EVERY DAY, Disp: 30 tablet, Rfl: 1   fluticasone (FLONASE) 50 MCG/ACT nasal spray, Place 2 sprays into both nostrils daily as needed for allergies., Disp: , Rfl:    ibuprofen (ADVIL) 200 MG tablet, Take 200 mg by mouth every 6 (six) hours as needed for moderate pain., Disp: , Rfl:    losartan (COZAAR) 100 MG tablet, TAKE 1 TABLET BY MOUTH EVERY DAY, Disp: 90 tablet, Rfl: 1   Multiple Vitamin (MULTIVITAMIN WITH MINERALS) TABS tablet, Take 1 tablet by mouth daily., Disp: , Rfl:    norethindrone (MICRONOR) 0.35 MG tablet, Take 1 tablet by mouth daily., Disp: , Rfl:    omeprazole (PRILOSEC) 40 MG capsule, Take 1 capsule (40 mg total) by mouth 2 (two) times daily., Disp: 180 capsule, Rfl: 3   ondansetron (ZOFRAN ODT) 4 MG disintegrating tablet, Take 1 tablet (4 mg total) by mouth every 8 (eight) hours as needed for nausea or vomiting., Disp: 20 tablet, Rfl: 0   polyethylene glycol powder (MIRALAX) 17 GM/SCOOP powder, Please take 1 capful dissolved in 8 oz of water prn constipation, Disp: 238 g, Rfl: 1   Semaglutide, 1 MG/DOSE, (OZEMPIC, 1 MG/DOSE,) 4 MG/3ML SOPN, Inject 1 mg into the skin once a week., Disp: 4.5 mL, Rfl: 1   spironolactone (ALDACTONE) 25 MG tablet, TAKE 1 TABLET BY MOUTH EVERY DAY, Disp: 30 tablet, Rfl: 0   SUMAtriptan (IMITREX) 50 MG tablet, TAKE 1  TABLET BY MOUTH EVERY 2 HOURS AS NEEDED FOR MIGRAINE. MAY REPEAT in 2 hours IF HEADACHE persists OR recurs, Disp: 10 tablet, Rfl: 0   topiramate (TOPAMAX) 50 MG tablet, TAKE 1 TABLET BY MOUTH 2 TIMES DAILY, Disp: 180 tablet, Rfl: 1   VITAMIN D PO, Take 1 tablet by mouth daily. , Disp: , Rfl:    Continuous Blood Gluc Transmit (DEXCOM G6 TRANSMITTER) MISC, Use to check blood sugars dx code e11.65, Disp: 3 each, Rfl: 3   CONTOUR NEXT TEST test strip, USE TO CHECK BLOOD SUGAR 3 TIMES DAILY AS DIRECTED, Disp:  200 strip, Rfl: 1   dicyclomine (BENTYL) 10 MG capsule, Take 1 capsule (10 mg total) by mouth 4 (four) times daily as needed for spasms., Disp: 30 capsule, Rfl: 3   [place] 610 MG CAPS, Take 1 capsule by mouth in the morning and at bedtime. (Patient not taking: Reported on 02/12/2021), Disp: , Rfl:    HYDROcodone-acetaminophen (NORCO/VICODIN) 5-325 MG tablet, Take 1 tablet by mouth every 4 (four) hours as needed. (Patient not taking: Reported on 01/25/2021), Disp: 10 tablet, Rfl: 0   hydrOXYzine (VISTARIL) 25 MG capsule, Take 1 capsule (25 mg total) by mouth 3 (three) times daily as needed. (Patient not taking: Reported on 01/25/2021), Disp: 30 capsule, Rfl: 0   Insulin Pen Needle (ULTICARE SHORT PEN NEEDLES) 31G X 8 MM MISC, AS DIRECTED 2 TIMES DAILY TO INJECT INSULIN, Disp: 100 each, Rfl: 3   Microlet Lancets MISC, CHECK BLOOD SUGAR 3 TIMES DAILY AS DIRECTED, Disp: 200 each, Rfl: 1   norgestimate-ethinyl estradiol (PREVIFEM) 0.25-35 MG-MCG tablet, Take 1 tablet by mouth daily. (Patient not taking: Reported on 01/25/2021), Disp: 28 tablet, Rfl: 5   zolpidem (AMBIEN) 10 MG tablet, Take 1 tablet (10 mg total) by mouth at bedtime as needed for sleep., Disp: 30 tablet, Rfl: 3   Allergies  Allergen Reactions   Tape Rash     Review of Systems  Constitutional: Negative.   Respiratory: Negative.    Cardiovascular: Negative.   Gastrointestinal:  Negative for abdominal pain.  Neurological:  Positive for  headaches.  Psychiatric/Behavioral: Negative.      Today's Vitals   02/12/21 0911  BP: 122/86  Pulse: 84  Temp: 97.9 F (36.6 C)  Weight: (!) 386 lb 12.8 oz (175.5 kg)  Height: '5\' 4"'  (1.626 m)   Body mass index is 66.39 kg/m.  Wt Readings from Last 3 Encounters:  02/12/21 (!) 386 lb 12.8 oz (175.5 kg)  10/23/20 (!) 392 lb 3.2 oz (177.9 kg)  09/18/20 (!) 395 lb (179.2 kg)    Objective:  Physical Exam Vitals reviewed.  Constitutional:      General: She is not in acute distress.    Appearance: Normal appearance. She is obese.  Cardiovascular:     Rate and Rhythm: Normal rate and regular rhythm.     Pulses: Normal pulses.     Heart sounds: Normal heart sounds. No murmur heard. Pulmonary:     Effort: Pulmonary effort is normal. No respiratory distress.     Breath sounds: Normal breath sounds. No wheezing.  Skin:    General: Skin is warm and dry.     Capillary Refill: Capillary refill takes less than 2 seconds.     Coloration: Skin is not jaundiced.  Neurological:     General: No focal deficit present.     Mental Status: She is alert and oriented to person, place, and time.     Cranial Nerves: No cranial nerve deficit.     Motor: No weakness.  Psychiatric:        Mood and Affect: Mood normal.        Behavior: Behavior normal.        Thought Content: Thought content normal.        Judgment: Judgment normal.        Assessment And Plan:     1. Controlled type 2 diabetes mellitus without complication, with long-term current use of insulin [facility]) Comments: Request opthalmology records [facility] Stop glimiperide and Lantus Start Farxiga 5 mg daily Increase Ozempic 1  mg - Hemoglobin A1c - Semaglutide, 1 MG/DOSE, (OZEMPIC, 1 MG/DOSE,) 4 MG/3ML SOPN; Inject 1 mg into the skin once a week.  Dispense: 4.5 mL; Refill: 1 - dapagliflozin propanediol (FARXIGA) 5 MG TABS tablet; Take 1 tablet (5 mg total) by mouth daily before breakfast.  Dispense: 90 tablet; Refill: 1 - Lipid  panel  2. Essential hypertension Comments: Good control Continue current medications - BMP8+eGFR  3. Elevated cholesterol Comments: Will check lipid panel and start statin also due to having diabetes - Lipid panel  4. Other migraine without status migrainosus, not intractable Comments: Improving and decreasing in number per month (2) No changes to medications at this time  5. Morbid obesity with BMI of 60.0-69.9, adult ([place]) Comments: Continue appt with [facility] D/c Lantus and glimiperide may be affecting weight loss, HgbA1c last 5.5 with her job ([employer]) 9lb weight loss 4 months    I personally spent 45 minutes face-to-face and non-face-to-face in the care of this patient, which includes all pre-, intra-, and post visit time on the date of service.  Patient was given opportunity to ask questions. Patient verbalized understanding of the plan and was able to repeat key elements of the plan. All questions were answered to their satisfaction.  [name], FNP   I, [name], FNP, have reviewed all documentation for this visit. The documentation on 02/12/21 for the exam, diagnosis, procedures, and orders are all accurate and complete.   IF YOU HAVE BEEN REFERRED TO A SPECIALIST, IT MAY TAKE 1-2 WEEKS TO SCHEDULE/PROCESS THE REFERRAL. IF YOU HAVE NOT HEARD FROM US/SPECIALIST IN TWO WEEKS, PLEASE GIVE [place] A CALL AT [phone].   THE PATIENT IS ENCOURAGED TO PRACTICE SOCIAL DISTANCING DUE TO THE COVID-19 PANDEMIC.

## 2021-02-13 LAB — BMP8+EGFR
BUN/Creatinine Ratio: 10 (ref 9–23)
BUN: 9 mg/dL (ref 6–20)
CO2: 20 mmol/L (ref 20–29)
Calcium: 11.2 mg/dL — ABNORMAL HIGH (ref 8.7–10.2)
Chloride: 106 mmol/L (ref 96–106)
Creatinine, Ser: 0.86 mg/dL (ref 0.57–1.00)
Glucose: 84 mg/dL (ref 65–99)
Potassium: 4.5 mmol/L (ref 3.5–5.2)
Sodium: 140 mmol/L (ref 134–144)
eGFR: 89 mL/min/{1.73_m2} (ref >59)

## 2021-02-13 LAB — HEMOGLOBIN A1C
Est. average glucose Bld gHb Est-mCnc: 103 mg/dL
Hgb A1c MFr Bld: 5.2 % (ref 4.8–5.6)

## 2021-02-13 LAB — LIPID PANEL
Chol/HDL Ratio: 4.8 ratio — ABNORMAL HIGH (ref 0.0–4.4)
Cholesterol, Total: 178 mg/dL (ref 100–199)
HDL: 37 mg/dL — ABNORMAL LOW (ref >39)
LDL Chol Calc (NIH): 118 mg/dL — ABNORMAL HIGH (ref 0–99)
Triglycerides: 128 mg/dL (ref 0–149)
VLDL Cholesterol Cal: 23 mg/dL (ref 5–40)

## 2021-02-14 ENCOUNTER — Encounter: Payer: Self-pay | Admitting: Nurse Practitioner

## 2021-02-23 ENCOUNTER — Other Ambulatory Visit: Payer: Self-pay | Admitting: Nurse Practitioner

## 2021-02-23 DIAGNOSIS — I10 Essential (primary) hypertension: Secondary | ICD-10-CM

## 2021-02-25 ENCOUNTER — Ambulatory Visit: Payer: BC Managed Care – PPO | Admitting: Gastroenterology

## 2021-02-26 DIAGNOSIS — Z1331 Encounter for screening for depression: Secondary | ICD-10-CM | POA: Diagnosis not present

## 2021-02-26 DIAGNOSIS — Z789 Other specified health status: Secondary | ICD-10-CM | POA: Diagnosis not present

## 2021-02-26 DIAGNOSIS — D649 Anemia, unspecified: Secondary | ICD-10-CM | POA: Diagnosis not present

## 2021-02-26 DIAGNOSIS — R635 Abnormal weight gain: Secondary | ICD-10-CM | POA: Diagnosis not present

## 2021-02-26 DIAGNOSIS — Z1339 Encounter for screening examination for other mental health and behavioral disorders: Secondary | ICD-10-CM | POA: Diagnosis not present

## 2021-02-26 DIAGNOSIS — N951 Menopausal and female climacteric states: Secondary | ICD-10-CM | POA: Diagnosis not present

## 2021-03-05 DIAGNOSIS — E559 Vitamin D deficiency, unspecified: Secondary | ICD-10-CM | POA: Diagnosis not present

## 2021-03-05 DIAGNOSIS — F331 Major depressive disorder, recurrent, moderate: Secondary | ICD-10-CM | POA: Diagnosis not present

## 2021-03-05 DIAGNOSIS — Z6841 Body Mass Index (BMI) 40.0 and over, adult: Secondary | ICD-10-CM | POA: Diagnosis not present

## 2021-03-16 ENCOUNTER — Other Ambulatory Visit: Payer: Self-pay | Admitting: Nurse Practitioner

## 2021-03-16 DIAGNOSIS — I10 Essential (primary) hypertension: Secondary | ICD-10-CM

## 2021-03-19 ENCOUNTER — Ambulatory Visit (HOSPITAL_BASED_OUTPATIENT_CLINIC_OR_DEPARTMENT_OTHER): Payer: BC Managed Care – PPO | Admitting: Cardiovascular Disease

## 2021-03-20 DIAGNOSIS — E119 Type 2 diabetes mellitus without complications: Secondary | ICD-10-CM | POA: Diagnosis not present

## 2021-03-20 DIAGNOSIS — Z6841 Body Mass Index (BMI) 40.0 and over, adult: Secondary | ICD-10-CM | POA: Diagnosis not present

## 2021-03-24 IMAGING — DX DG CHEST 1V PORT
1 series · 1 of 1 positions shown · non-contrast
Comparison: 10/07/2019

CLINICAL DATA: Shortness of breath, COVID

EXAM:
PORTABLE CHEST 1 VIEW

[chest ap]
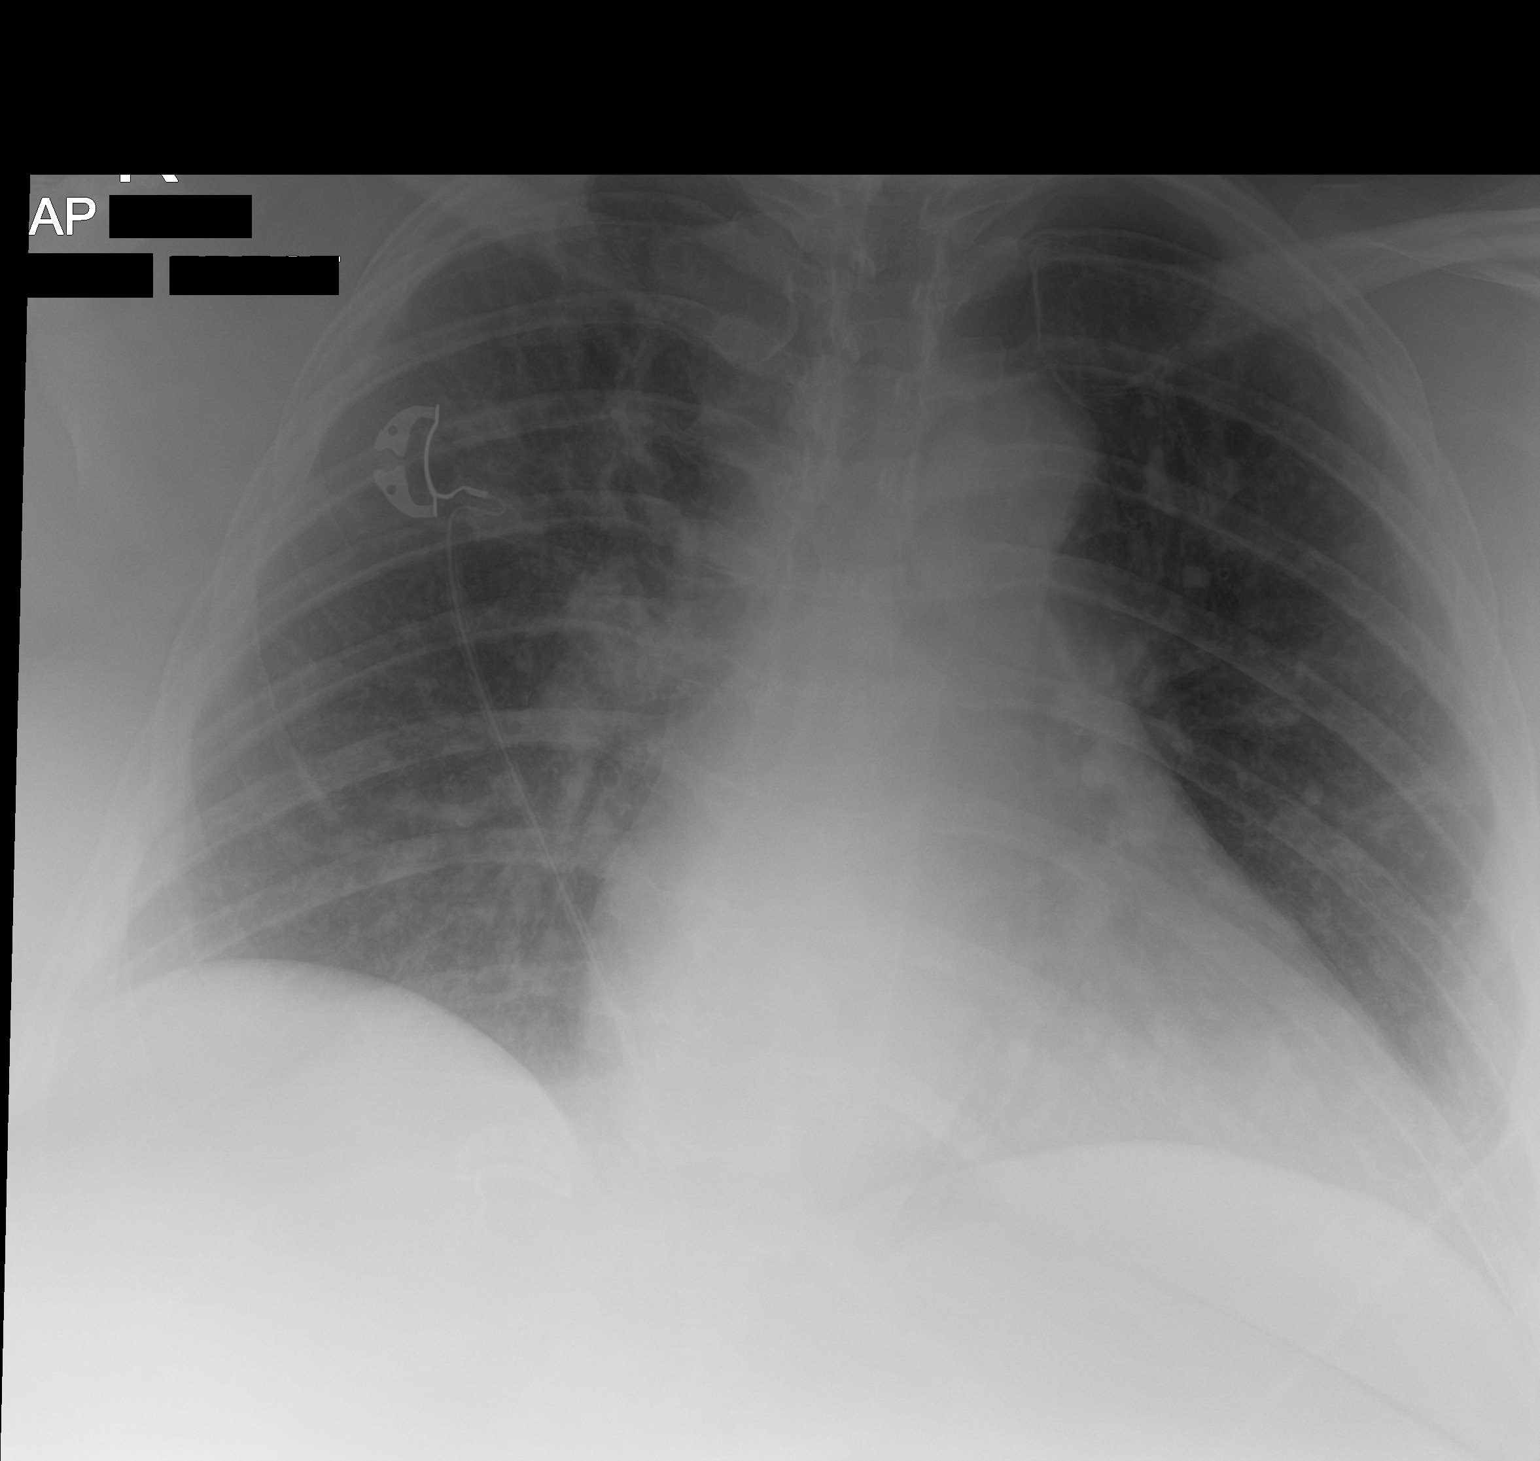

[1 of 1 positions shown; findings below may reference images not displayed]

FINDINGS: Mild patchy right upper lobe opacity. Left lung is clear. No pleural
effusion or pneumothorax.

Heart is normal in size.
IMPRESSION: Mild right upper lobe opacity, likely reflecting pneumonia in this
patient with known COVID.

## 2021-03-27 DIAGNOSIS — E119 Type 2 diabetes mellitus without complications: Secondary | ICD-10-CM | POA: Diagnosis not present

## 2021-03-27 DIAGNOSIS — Z6841 Body Mass Index (BMI) 40.0 and over, adult: Secondary | ICD-10-CM | POA: Diagnosis not present

## 2021-03-28 ENCOUNTER — Other Ambulatory Visit: Payer: Self-pay | Admitting: Gastroenterology

## 2021-03-28 ENCOUNTER — Other Ambulatory Visit: Payer: Self-pay | Admitting: Nurse Practitioner

## 2021-03-28 DIAGNOSIS — R1084 Generalized abdominal pain: Secondary | ICD-10-CM

## 2021-04-03 ENCOUNTER — Ambulatory Visit (INDEPENDENT_AMBULATORY_CARE_PROVIDER_SITE_OTHER): Payer: BC Managed Care – PPO | Admitting: Nurse Practitioner

## 2021-04-03 ENCOUNTER — Encounter: Payer: Self-pay | Admitting: Nurse Practitioner

## 2021-04-03 ENCOUNTER — Other Ambulatory Visit: Payer: Self-pay

## 2021-04-03 VITALS — BP 138/82 | HR 92 | Temp 98.1°F | Ht 64.0 in | Wt 373.0 lb

## 2021-04-03 DIAGNOSIS — I10 Essential (primary) hypertension: Secondary | ICD-10-CM

## 2021-04-03 DIAGNOSIS — E119 Type 2 diabetes mellitus without complications: Secondary | ICD-10-CM

## 2021-04-03 DIAGNOSIS — Z794 Long term (current) use of insulin: Secondary | ICD-10-CM

## 2021-04-03 DIAGNOSIS — Z23 Encounter for immunization: Secondary | ICD-10-CM

## 2021-04-03 DIAGNOSIS — Z6841 Body Mass Index (BMI) 40.0 and over, adult: Secondary | ICD-10-CM

## 2021-04-03 MED ORDER — EDARBI 40 MG PO TABS: 1.0000 | ORAL_TABLET | Freq: Every day | ORAL | 5 refills | Status: DC

## 2021-04-03 NOTE — Patient Instructions (Addendum)
Diabetes Mellitus and Nutrition, Adult When you have diabetes, or diabetes mellitus, it is very important to have healthy eating habits because your blood sugar (glucose) levels are greatly affected by what you eat and drink. Eating healthy foods in the right amounts, at about the same times every day, can help you: Control your blood glucose. Lower your risk of heart disease. Improve your blood pressure. Reach or maintain a healthy weight. What can affect my meal plan? Every person with diabetes is different, and each person has different needs for a meal plan. Your health care provider may recommend that you work with a dietitian to make a meal plan that is best for you. Your meal plan may vary depending on factors such as: The calories you need. The medicines you take. Your weight. Your blood glucose, blood pressure, and cholesterol levels. Your activity level. Other health conditions you have, such as heart or kidney disease. How do carbohydrates affect me? Carbohydrates, also called carbs, affect your blood glucose level more than any other type of food. Eating carbs naturally raises the amount of glucose in your blood. Carb counting is a method for keeping track of how many carbs you eat. Counting carbs is important to keep your blood glucose at a healthy level, especially if you use insulin or take certain oral diabetes medicines. It is important to know how many carbs you can safely have in each meal. This is different for every person. Your dietitian can help you calculate how many carbs you should have at each meal and for each snack. How does alcohol affect me? Alcohol can cause a sudden decrease in blood glucose (hypoglycemia), especially if you use insulin or take certain oral diabetes medicines. Hypoglycemia can be a life-threatening condition. Symptoms of hypoglycemia, such as sleepiness, dizziness, and confusion, are similar to symptoms of having too much alcohol. Do not drink  alcohol if: Your health care provider tells you not to drink. You are pregnant, may be pregnant, or are planning to become pregnant. If you drink alcohol: Do not drink on an empty stomach. Limit how much you use to: 0-1 drink a day for women. 0-2 drinks a day for men. Be aware of how much alcohol is in your drink. In the [place] one drink equals one 12 oz bottle of beer (355 mL), one 5 oz glass of wine (148 mL), or one 1 oz glass of hard liquor (44 mL). Keep yourself hydrated with water, diet soda, or unsweetened iced tea. Keep in mind that regular soda, juice, and other mixers may contain a lot of sugar and must be counted as carbs. What are tips for following this plan? Reading food labels Start by checking the serving size on the "Nutrition Facts" label of packaged foods and drinks. The amount of calories, carbs, fats, and other nutrients listed on the label is based on one serving of the item. Many items contain more than one serving per package. Check the total grams (g) of carbs in one serving. You can calculate the number of servings of carbs in one serving by dividing the total carbs by 15. For example, if a food has 30 g of total carbs per serving, it would be equal to 2 servings of carbs. Check the number of grams (g) of saturated fats and trans fats in one serving. Choose foods that have a low amount or none of these fats. Check the number of milligrams (mg) of salt (sodium) in one serving. Most people should limit  total sodium intake to less than 2,300 mg per day. Always check the nutrition information of foods labeled as "low-fat" or "nonfat." These foods may be higher in added sugar or refined carbs and should be avoided. Talk to your dietitian to identify your daily goals for nutrients listed on the label. Shopping Avoid buying canned, pre-made, or processed foods. These foods tend to be high in fat, sodium, and added sugar. Shop around the outside edge of the grocery store. This  is where you will most often find fresh fruits and vegetables, bulk grains, fresh meats, and fresh dairy. Cooking Use low-heat cooking methods, such as baking, instead of high-heat cooking methods like deep frying. Cook using healthy oils, such as olive, canola, or sunflower oil. Avoid cooking with butter, cream, or high-fat meats. Meal planning Eat meals and snacks regularly, preferably at the same times every day. Avoid going long periods of time without eating. Eat foods that are high in fiber, such as fresh fruits, vegetables, beans, and whole grains. Talk with your dietitian about how many servings of carbs you can eat at each meal. Eat 4-6 oz (112-168 g) of lean protein each day, such as lean meat, chicken, fish, eggs, or tofu. One ounce (oz) of lean protein is equal to: 1 oz (28 g) of meat, chicken, or fish. 1 egg.  cup (62 g) of tofu. Eat some foods each day that contain healthy fats, such as avocado, nuts, seeds, and fish. What foods should I eat? Fruits Berries. Apples. Oranges. Peaches. Apricots. Plums. Grapes. Mango. Papaya. Pomegranate. Kiwi. Cherries. Vegetables Lettuce. Spinach. Leafy greens, including kale, chard, collard greens, and mustard greens. Beets. Cauliflower. Cabbage. Broccoli. Carrots. Green beans. Tomatoes. Peppers. Onions. Cucumbers. Brussels sprouts. Grains Whole grains, such as whole-wheat or whole-grain bread, crackers, tortillas, cereal, and pasta. Unsweetened oatmeal. Quinoa. Brown or wild rice. Meats and other proteins Seafood. Poultry without skin. Lean cuts of poultry and beef. Tofu. Nuts. Seeds. Dairy Low-fat or fat-free dairy products such as milk, yogurt, and cheese. The items listed above may not be a complete list of foods and beverages you can eat. Contact a dietitian for more information. What foods should I avoid? Fruits Fruits canned with syrup. Vegetables Canned vegetables. Frozen vegetables with butter or cream sauce. Grains Refined  white flour and flour products such as bread, pasta, snack foods, and cereals. Avoid all processed foods. Meats and other proteins Fatty cuts of meat. Poultry with skin. Breaded or fried meats. Processed meat. Avoid saturated fats. Dairy Full-fat yogurt, cheese, or milk. Beverages Sweetened drinks, such as soda or iced tea. The items listed above may not be a complete list of foods and beverages you should avoid. Contact a dietitian for more information. Questions to ask a health care provider Do I need to meet with a diabetes educator? Do I need to meet with a dietitian? What number can I call if I have questions? When are the best times to check my blood glucose? Where to find more information: American Diabetes Association: diabetes.org Academy of Nutrition and Dietetics: www.eatright.[employer] of Diabetes and Digestive and Kidney Diseases: [employer]MoinesFuneral.dk Association of Diabetes Care and Education Specialists: www.diabeteseducator.org Summary It is important to have healthy eating habits because your blood sugar (glucose) levels are greatly affected by what you eat and drink. A healthy meal plan will help you control your blood glucose and maintain a healthy lifestyle. Your health care provider may recommend that you work with a dietitian to make a meal plan that  is best for you. Keep in mind that carbohydrates (carbs) and alcohol have immediate effects on your blood glucose levels. It is important to count carbs and to use alcohol carefully. This information is not intended to replace advice given to you by your health care provider. Make sure you discuss any questions you have with your health care provider. Document Revised: 06/07/2019 Document Reviewed: 06/07/2019 Elsevier Patient Education  [place].  Influenza (Flu) Vaccine (Inactivated or Recombinant): What You Need to Know 1. Why get vaccinated? Influenza vaccine can prevent influenza (flu). Flu is a  contagious disease that spreads around the [place] every year, usually between October and May. Anyone can get the flu, but it is more dangerous for some people. Infants and young children, people 85 years and older, pregnant people, and people with certain health conditions or a weakened immune system are at greatest risk of flu complications. Pneumonia, bronchitis, sinus infections, and ear infections are examples of flu-related complications. If you have a medical condition, such as heart disease, cancer, or diabetes, flu can make it worse. Flu can cause fever and chills, sore throat, muscle aches, fatigue, cough, headache, and runny or stuffy nose. Some people may have vomiting and diarrhea, though this is more common in children than adults. In an average year, thousands of people in the [place] die from flu, and many more are hospitalized. Flu vaccine prevents millions of illnesses and flu-related visits to the doctor each year. 2. Influenza vaccines CDC recommends everyone 6 months and older get vaccinated every flu season. Children 6 months through 74 years of age may need 2 doses during a single flu season. Everyone else needs only 1 dose each flu season. It takes about 2 weeks for protection to develop after vaccination. There are many flu viruses, and they are always changing. Each year a new flu vaccine is made to protect against the influenza viruses believed to be likely to cause disease in the upcoming flu season. Even when the vaccine doesn't exactly match these viruses, it may still provide some protection. Influenza vaccine does not cause flu. Influenza vaccine may be given at the same time as other vaccines. 3. Talk with your health care provider Tell your vaccination provider if the person getting the vaccine: Has had an allergic reaction after a previous dose of influenza vaccine, or has any severe, life-threatening allergies Has ever had Guillain-Barr Syndrome (also  called "GBS") In some cases, your health care provider may decide to postpone influenza vaccination until a future visit. Influenza vaccine can be administered at any time during pregnancy. People who are or will be pregnant during influenza season should receive inactivated influenza vaccine. People with minor illnesses, such as a cold, may be vaccinated. People who are moderately or severely ill should usually wait until they recover before getting influenza vaccine. Your health care provider can give you more information. 4. Risks of a vaccine reaction Soreness, redness, and swelling where the shot is given, fever, muscle aches, and headache can happen after influenza vaccination. There may be a very small increased risk of Guillain-Barr Syndrome (GBS) after inactivated influenza vaccine (the flu shot). Young children who get the flu shot along with pneumococcal vaccine (PCV13) and/or DTaP vaccine at the same time might be slightly more likely to have a seizure caused by fever. Tell your health care provider if a child who is getting flu vaccine has ever had a seizure. People sometimes faint after medical procedures, including vaccination. Tell your  provider if you feel dizzy or have vision changes or ringing in the ears. As with any medicine, there is a very remote chance of a vaccine causing a severe allergic reaction, other serious injury, or death. 5. What if there is a serious problem? An allergic reaction could occur after the vaccinated person leaves the clinic. If you see signs of a severe allergic reaction (hives, swelling of the face and throat, difficulty breathing, a fast heartbeat, dizziness, or weakness), call 9-1-1 and get the person to the nearest hospital. For other signs that concern you, call your health care provider. Adverse reactions should be reported to the Vaccine Adverse Event Reporting System (VAERS). Your health care provider will usually file this report, or you can do  it yourself. Visit the VAERS website at www.vaers.SamedayNews.es or call 505 084 8347. VAERS is only for reporting reactions, and VAERS staff members do not give medical advice. 6. The National Vaccine Injury Compensation Program The [employer] Vaccine Injury Compensation Program (VICP) is a federal program that was created to compensate people who may have been injured by certain vaccines. Claims regarding alleged injury or death due to vaccination have a time limit for filing, which may be as short as two years. Visit the VICP website at GoldCloset.com.ee or call 334-346-6648 to learn about the program and about filing a claim. 7. How can I learn more? Ask your health care provider. Call your local or state health department. Visit the website of the Food and Drug Administration (FDA) for vaccine package inserts and additional information at TraderRating.uy. Contact the Centers for Disease Control and Prevention (CDC): Call 343-344-3737 (1-800-CDC-INFO) or Visit CDC's website at https://gibson.com/. Vaccine Information Statement Inactivated Influenza Vaccine (02/17/2020) This information is not intended to replace advice given to you by your health care provider. Make sure you discuss any questions you have with your health care provider. Document Revised: 04/05/2020 Document Reviewed: 04/05/2020 Elsevier Patient Education  [place].  Stop taking Losartan and start [place] daily

## 2021-04-03 NOTE — Progress Notes (Signed)
I,Tianna Badgett,acting as a Education administrator for [employer], FNP.,have documented all relevant documentation on the behalf of [name], FNP,as directed by  [name], FNP while in the presence of [name], [place].  This visit occurred during the SARS-CoV-2 public health emergency.  Safety protocols were in place, including screening questions prior to the visit, additional usage of staff PPE, and extensive cleaning of exam room while observing appropriate contact time as indicated for disinfecting solutions.  Subjective:     Patient ID: Emily Phelps , female    DOB: September 03, 1980 , 40 y.o.   MRN: [id number]   Chief Complaint  Patient presents with   Diabetes    HPI  Patient presents today for new medication Farxiga and increase of Ozempic.  At her last appointment she was started on Farxiga and her Ozempic was increased. She is going to [facility] after her last visit, she has cut out pastas and high carb diets for at least the next 12 weeks. She is also working with a program with her job for low Keto. She feels like her blood pressure has been elevated and had a headache. She has rescheduled her initial visit with Cardiology. Her mother was diagnosed with Hypertropic Cardiomyopathy.   Wt Readings from Last 3 Encounters: 04/03/21 : (!) 373 lb (169.2 kg) 02/12/21 : (!) 386 lb 12.8 oz (175.5 kg) 10/23/20 : (!) 392 lb 3.2 oz (177.9 kg)       Past Medical History:  Diagnosis Date   Allergy    Diabetes mellitus ([place])    Family history of adverse reaction to anesthesia    mother had n/v after    GERD (gastroesophageal reflux disease)    Hyperlipidemia    Hypertension    Migraine    Obesity    Wears contact lenses      Family History  Problem Relation Age of Onset   Hypertension Mother    Colon polyps Mother    Hypertension Brother    Hypertension Maternal Grandmother    Heart disease Maternal Grandmother    Diabetes Father    Hypertension Father    Prostate cancer Father     Thyroid disease Maternal Aunt    Colon cancer Neg Hx    Esophageal cancer Neg Hx    Liver cancer Neg Hx    Stomach cancer Neg Hx    Rectal cancer Neg Hx      Current Outpatient Medications:    Azilsartan Medoxomil (EDARBI) 40 MG TABS, Take 1 tablet by mouth daily., Disp: 30 tablet, Rfl: 5   albuterol (VENTOLIN HFA) 108 (90 Base) MCG/ACT inhaler, Inhale 2 puffs into the lungs every 6 (six) hours as needed for wheezing or shortness of breath., Disp: 6.7 g, Rfl: 0   atenolol (TENORMIN) 50 MG tablet, TAKE 1 TABLET BY MOUTH EVERY DAY, Disp: 90 tablet, Rfl: 6   Continuous Blood Gluc Receiver (DEXCOM G6 RECEIVER) DEVI, Use to check blood sugars dx code e11.65, Disp: 3 each, Rfl: 3   Continuous Blood Gluc Sensor (DEXCOM G6 SENSOR) MISC, USE TO CHECK BLOOD SUGAR. CHANGE EVERY 10 DAYS, Disp: 3 each, Rfl: 0   Continuous Blood Gluc Transmit (DEXCOM G6 TRANSMITTER) MISC, USE TO CHECK BLOOD SUGAR. CHANGE EVERY 90 DAYS, Disp: 1 each, Rfl: 3   CONTOUR NEXT TEST test strip, USE TO CHECK BLOOD SUGAR 3 TIMES DAILY AS DIRECTED, Disp: 200 strip, Rfl: 1   Cyanocobalamin (VITAMIN B12) 1000 MCG TBCR, Take 1,000 mcg by mouth daily. , Disp: ,  Rfl:    dapagliflozin propanediol (FARXIGA) 5 MG TABS tablet, Take 1 tablet (5 mg total) by mouth daily before breakfast., Disp: 90 tablet, Rfl: 1   dicyclomine (BENTYL) 10 MG capsule, Take 1 capsule (10 mg total) by mouth 4 (four) times daily as needed for spasms., Disp: 30 capsule, Rfl: 2   fexofenadine (ALLEGRA) 180 MG tablet, TAKE 1 TABLET BY MOUTH EVERY DAY, Disp: 30 tablet, Rfl: 1   fluticasone (FLONASE) 50 MCG/ACT nasal spray, Place 2 sprays into both nostrils daily as needed for allergies., Disp: , Rfl:    ibuprofen (ADVIL) 200 MG tablet, Take 200 mg by mouth every 6 (six) hours as needed for moderate pain., Disp: , Rfl:    Insulin Pen Needle (ULTICARE SHORT PEN NEEDLES) 31G X 8 MM MISC, AS DIRECTED 2 TIMES DAILY TO INJECT INSULIN, Disp: 100 each, Rfl: 3   Microlet  Lancets MISC, CHECK BLOOD SUGAR 3 TIMES DAILY AS DIRECTED, Disp: 200 each, Rfl: 1   Multiple Vitamin (MULTIVITAMIN WITH MINERALS) TABS tablet, Take 1 tablet by mouth daily., Disp: , Rfl:    norethindrone (MICRONOR) 0.35 MG tablet, Take 1 tablet by mouth daily., Disp: , Rfl:    omeprazole (PRILOSEC) 40 MG capsule, Take 1 capsule (40 mg total) by mouth 2 (two) times daily., Disp: 180 capsule, Rfl: 3   ondansetron (ZOFRAN ODT) 4 MG disintegrating tablet, Take 1 tablet (4 mg total) by mouth every 8 (eight) hours as needed for nausea or vomiting., Disp: 20 tablet, Rfl: 0   OZEMPIC, 1 MG/DOSE, 4 MG/3ML SOPN, Inject 1 mg into the skin once a week., Disp: 4.5 mL, Rfl: 1   polyethylene glycol powder (MIRALAX) 17 GM/SCOOP powder, Please take 1 capful dissolved in 8 oz of water prn constipation, Disp: 238 g, Rfl: 1   spironolactone (ALDACTONE) 25 MG tablet, TAKE 1 TABLET BY MOUTH EVERY DAY, Disp: 30 tablet, Rfl: 0   SUMAtriptan (IMITREX) 50 MG tablet, TAKE 1 TABLET BY MOUTH EVERY 2 HOURS AS NEEDED FOR MIGRAINE. MAY REPEAT in 2 hours IF HEADACHE persists OR recurs, Disp: 10 tablet, Rfl: 0   topiramate (TOPAMAX) 50 MG tablet, TAKE 1 TABLET BY MOUTH 2 TIMES DAILY, Disp: 180 tablet, Rfl: 1   VITAMIN D PO, Take 1 tablet by mouth daily. , Disp: , Rfl:    zolpidem (AMBIEN) 10 MG tablet, Take 1 tablet (10 mg total) by mouth at bedtime as needed for sleep., Disp: 30 tablet, Rfl: 3   Allergies  Allergen Reactions   Tape Rash     Review of Systems  Constitutional: Negative.   Respiratory: Negative.    Cardiovascular: Negative.  Negative for chest pain, palpitations and leg swelling.  Neurological:  Negative for dizziness and headaches.  Psychiatric/Behavioral: Negative.      Today's Vitals   04/03/21 0836  BP: 138/82  Pulse: 92  Temp: 98.1 F (36.7 C)  TempSrc: Oral  Weight: (!) 373 lb (169.2 kg)  Height: '5\' 4"'  (1.626 m)   Body mass index is 64.03 kg/m.  Wt Readings from Last 3 Encounters:  04/03/21  (!) 373 lb (169.2 kg)  02/12/21 (!) 386 lb 12.8 oz (175.5 kg)  10/23/20 (!) 392 lb 3.2 oz (177.9 kg)    Objective:  Physical Exam Vitals reviewed.  Constitutional:      General: She is not in acute distress.    Appearance: Normal appearance. She is obese.  Cardiovascular:     Rate and Rhythm: Normal rate and regular rhythm.  Pulses: Normal pulses.     Heart sounds: Normal heart sounds. No murmur heard. Pulmonary:     Effort: Pulmonary effort is normal. No respiratory distress.     Breath sounds: Normal breath sounds. No wheezing.  Skin:    General: Skin is warm and dry.     Capillary Refill: Capillary refill takes less than 2 seconds.     Coloration: Skin is not jaundiced.  Neurological:     General: No focal deficit present.     Mental Status: She is alert and oriented to person, place, and time.     Cranial Nerves: No cranial nerve deficit.     Motor: No weakness.  Psychiatric:        Mood and Affect: Mood normal.        Behavior: Behavior normal.        Thought Content: Thought content normal.        Judgment: Judgment normal.        Assessment And Plan:     1. Controlled type 2 diabetes mellitus without complication, with long-term current use of insulin (HCC) Comments: Her HgbA1c is better and within normal.  2. Essential hypertension Comments: Fair control, continue current medications - Azilsartan Medoxomil (EDARBI) 40 MG TABS; Take 1 tablet by mouth daily.  Dispense: 30 tablet; Refill: 5  3. Serum calcium elevated - Multiple Myeloma Panel (SPEP&IFE w/QIG) - BMP8+eGFR  4. Need for influenza vaccination Influenza vaccine administered Encouraged to take Tylenol as needed for fever or muscle aches. - Flu Vaccine QUAD 6+ mos PF IM (Fluarix Quad PF)  5. Morbid obesity with BMI of 60.0-69.9, adult ([place]) She is encouraged to strive for BMI less than 30 to decrease cardiac risk. Advised to aim for at least 150 minutes of exercise per week.    Patient was  given opportunity to ask questions. Patient verbalized understanding of the plan and was able to repeat key elements of the plan. All questions were answered to their satisfaction.  [name], FNP   I, [name], FNP, have reviewed all documentation for this visit. The documentation on 04/12/21 for the exam, diagnosis, procedures, and orders are all accurate and complete.   IF YOU HAVE BEEN REFERRED TO A SPECIALIST, IT MAY TAKE 1-2 WEEKS TO SCHEDULE/PROCESS THE REFERRAL. IF YOU HAVE NOT HEARD FROM US/SPECIALIST IN TWO WEEKS, PLEASE GIVE [place] A CALL AT [phone] X [phone].   THE PATIENT IS ENCOURAGED TO PRACTICE SOCIAL DISTANCING DUE TO THE COVID-19 PANDEMIC.

## 2021-04-04 DIAGNOSIS — F331 Major depressive disorder, recurrent, moderate: Secondary | ICD-10-CM | POA: Diagnosis not present

## 2021-04-04 DIAGNOSIS — Z6841 Body Mass Index (BMI) 40.0 and over, adult: Secondary | ICD-10-CM | POA: Diagnosis not present

## 2021-04-04 DIAGNOSIS — M2550 Pain in unspecified joint: Secondary | ICD-10-CM | POA: Diagnosis not present

## 2021-04-08 LAB — BMP8+EGFR
BUN/Creatinine Ratio: 12 (ref 9–23)
BUN: 10 mg/dL (ref 6–20)
CO2: 22 mmol/L (ref 20–29)
Calcium: 11.9 mg/dL — ABNORMAL HIGH (ref 8.7–10.2)
Chloride: 100 mmol/L (ref 96–106)
Creatinine, Ser: 0.84 mg/dL (ref 0.57–1.00)
Glucose: 89 mg/dL (ref 65–99)
Potassium: 4.7 mmol/L (ref 3.5–5.2)
Sodium: 138 mmol/L (ref 134–144)
eGFR: 92 mL/min/{1.73_m2} (ref >59)

## 2021-04-08 LAB — MULTIPLE MYELOMA PANEL, SERUM
Albumin SerPl Elph-Mcnc: 3.8 g/dL (ref 2.9–4.4)
Albumin/Glob SerPl: 1.1 (ref 0.7–1.7)
Alpha 1: 0.3 g/dL (ref 0.0–0.4)
Alpha2 Glob SerPl Elph-Mcnc: 0.7 g/dL (ref 0.4–1.0)
B-Globulin SerPl Elph-Mcnc: 1.3 g/dL (ref 0.7–1.3)
Gamma Glob SerPl Elph-Mcnc: 1.4 g/dL (ref 0.4–1.8)
Globulin, Total: 3.7 g/dL (ref 2.2–3.9)
IgA/Immunoglobulin A, Serum: 194 mg/dL (ref 87–352)
IgG (Immunoglobin G), Serum: 1211 mg/dL (ref 586–1602)
IgM (Immunoglobulin M), Srm: 192 mg/dL (ref 26–217)
Total Protein: 7.5 g/dL (ref 6.0–8.5)

## 2021-04-09 ENCOUNTER — Other Ambulatory Visit: Payer: Self-pay | Admitting: Nurse Practitioner

## 2021-04-11 ENCOUNTER — Other Ambulatory Visit: Payer: Self-pay | Admitting: Nurse Practitioner

## 2021-04-11 DIAGNOSIS — D649 Anemia, unspecified: Secondary | ICD-10-CM | POA: Diagnosis not present

## 2021-04-11 DIAGNOSIS — E119 Type 2 diabetes mellitus without complications: Secondary | ICD-10-CM

## 2021-04-11 DIAGNOSIS — Z6841 Body Mass Index (BMI) 40.0 and over, adult: Secondary | ICD-10-CM | POA: Diagnosis not present

## 2021-04-11 DIAGNOSIS — I10 Essential (primary) hypertension: Secondary | ICD-10-CM | POA: Diagnosis not present

## 2021-04-11 DIAGNOSIS — Z794 Long term (current) use of insulin: Secondary | ICD-10-CM

## 2021-04-17 ENCOUNTER — Other Ambulatory Visit: Payer: Self-pay | Admitting: Nurse Practitioner

## 2021-04-18 DIAGNOSIS — E119 Type 2 diabetes mellitus without complications: Secondary | ICD-10-CM | POA: Diagnosis not present

## 2021-04-18 DIAGNOSIS — Z6841 Body Mass Index (BMI) 40.0 and over, adult: Secondary | ICD-10-CM | POA: Diagnosis not present

## 2021-04-22 ENCOUNTER — Other Ambulatory Visit: Payer: Self-pay | Admitting: Nurse Practitioner

## 2021-04-22 DIAGNOSIS — I10 Essential (primary) hypertension: Secondary | ICD-10-CM

## 2021-04-25 DIAGNOSIS — E559 Vitamin D deficiency, unspecified: Secondary | ICD-10-CM | POA: Diagnosis not present

## 2021-04-25 DIAGNOSIS — Z6841 Body Mass Index (BMI) 40.0 and over, adult: Secondary | ICD-10-CM | POA: Diagnosis not present

## 2021-05-02 DIAGNOSIS — E119 Type 2 diabetes mellitus without complications: Secondary | ICD-10-CM | POA: Diagnosis not present

## 2021-05-02 DIAGNOSIS — Z6841 Body Mass Index (BMI) 40.0 and over, adult: Secondary | ICD-10-CM | POA: Diagnosis not present

## 2021-05-09 ENCOUNTER — Other Ambulatory Visit: Payer: Self-pay | Admitting: Nurse Practitioner

## 2021-05-09 ENCOUNTER — Other Ambulatory Visit: Payer: Self-pay

## 2021-05-09 ENCOUNTER — Other Ambulatory Visit (HOSPITAL_COMMUNITY)
Admission: RE | Admit: 2021-05-09 | Discharge: 2021-05-09 | Disposition: A | Discharge status: Home / Self Care | Payer: BC Managed Care – PPO | Source: Ambulatory Visit | Attending: Nurse Practitioner | Admitting: Nurse Practitioner

## 2021-05-09 ENCOUNTER — Encounter: Payer: Self-pay | Admitting: Nurse Practitioner

## 2021-05-09 ENCOUNTER — Ambulatory Visit (INDEPENDENT_AMBULATORY_CARE_PROVIDER_SITE_OTHER): Payer: BC Managed Care – PPO | Admitting: Nurse Practitioner

## 2021-05-09 DIAGNOSIS — N760 Acute vaginitis: Secondary | ICD-10-CM | POA: Diagnosis not present

## 2021-05-09 DIAGNOSIS — Z6841 Body Mass Index (BMI) 40.0 and over, adult: Secondary | ICD-10-CM

## 2021-05-09 DIAGNOSIS — N898 Other specified noninflammatory disorders of vagina: Secondary | ICD-10-CM | POA: Insufficient documentation

## 2021-05-09 DIAGNOSIS — E559 Vitamin D deficiency, unspecified: Secondary | ICD-10-CM | POA: Diagnosis not present

## 2021-05-09 NOTE — Progress Notes (Signed)
I,Tianna Badgett,acting as a Education administrator for [employer], FNP.,have documented all relevant documentation on the behalf of [name], FNP,as directed by  [name], FNP while in the presence of [name], [place]..  This visit occurred during the SARS-CoV-2 public health emergency.  Safety protocols were in place, including screening questions prior to the visit, additional usage of staff PPE, and extensive cleaning of exam room while observing appropriate contact time as indicated for disinfecting solutions.  Subjective:     Patient ID: Emily Phelps , female    DOB: [birth year] , 40 y.o.   MRN: [id number]   Chief Complaint  Patient presents with   Hypertension    HPI  Patient is here hypertension follow up. [place] is [place] and feels like she is doing well.  She is on a 1500 calorie diet restriction and no grain for 13 weeks. She is having vaginal itching and vaginal odor  Wt Readings from Last 3 Encounters: 05/09/21 : (!) 375 lb (170.1 kg) 04/03/21 : (!) 373 lb (169.2 kg) 02/12/21 : (!) 386 lb 12.8 oz (175.5 kg)      Past Medical History:  Diagnosis Date   Allergy    Diabetes mellitus ([place])    Family history of adverse reaction to anesthesia    mother had n/v after    GERD (gastroesophageal reflux disease)    Hyperlipidemia    Hypertension    Migraine    Obesity    Wears contact lenses      Family History  Problem Relation Age of Onset   Hypertension Mother    Colon polyps Mother    Hypertension Brother    Hypertension Maternal Grandmother    Heart disease Maternal Grandmother    Diabetes Father    Hypertension Father    Prostate cancer Father    Thyroid disease Maternal Aunt    Colon cancer Neg Hx    Esophageal cancer Neg Hx    Liver cancer Neg Hx    Stomach cancer Neg Hx    Rectal cancer Neg Hx      Current Outpatient Medications:    albuterol (VENTOLIN HFA) 108 (90 Base) MCG/ACT inhaler, Inhale 2 puffs into the lungs every 6 (six) hours as  needed for wheezing or shortness of breath., Disp: 6.7 g, Rfl: 0   atenolol (TENORMIN) 50 MG tablet, TAKE 1 TABLET BY MOUTH EVERY DAY, Disp: 90 tablet, Rfl: 6   Azilsartan Medoxomil (EDARBI) 40 MG TABS, Take 1 tablet by mouth daily., Disp: 30 tablet, Rfl: 5   Continuous Blood Gluc Receiver ([place]) DEVI, Use to check blood sugars dx code e11.65, Disp: 3 each, Rfl: 3   Continuous Blood Gluc Sensor (DEXCOM G6 SENSOR) MISC, USE TO CHECK BLOOD SUGAR. CHANGE EVERY 10 DAYS, Disp: 3 each, Rfl: 0   Continuous Blood Gluc Transmit (DEXCOM G6 TRANSMITTER) MISC, USE TO CHECK BLOOD SUGAR. CHANGE EVERY 90 DAYS, Disp: 1 each, Rfl: 3   CONTOUR NEXT TEST test strip, USE TO CHECK BLOOD SUGAR 3 TIMES DAILY AS DIRECTED, Disp: 200 strip, Rfl: 1   Cyanocobalamin (VITAMIN B12) 1000 MCG TBCR, Take 1,000 mcg by mouth daily. , Disp: , Rfl:    dapagliflozin propanediol (FARXIGA) 5 MG TABS tablet, Take 1 tablet (5 mg total) by mouth daily before breakfast., Disp: 90 tablet, Rfl: 1   dicyclomine (BENTYL) 10 MG capsule, Take 1 capsule (10 mg total) by mouth 4 (four) times daily as needed for spasms., Disp: 30 capsule, Rfl:  2   fexofenadine (ALLEGRA) 180 MG tablet, TAKE 1 TABLET BY MOUTH EVERY DAY, Disp: 30 tablet, Rfl: 1   fluticasone (FLONASE) 50 MCG/ACT nasal spray, Place 2 sprays into both nostrils daily as needed for allergies., Disp: , Rfl:    ibuprofen (ADVIL) 200 MG tablet, Take 200 mg by mouth every 6 (six) hours as needed for moderate pain., Disp: , Rfl:    Multiple Vitamin (MULTIVITAMIN WITH MINERALS) TABS tablet, Take 1 tablet by mouth daily., Disp: , Rfl:    norethindrone (MICRONOR) 0.35 MG tablet, 1 tablet, Disp: , Rfl:    omeprazole (PRILOSEC) 40 MG capsule, Take 1 capsule (40 mg total) by mouth 2 (two) times daily., Disp: 180 capsule, Rfl: 3   ondansetron (ZOFRAN ODT) 4 MG disintegrating tablet, Take 1 tablet (4 mg total) by mouth every 8 (eight) hours as needed for nausea or vomiting., Disp: 20 tablet,  Rfl: 0   OZEMPIC, 1 MG/DOSE, 4 MG/3ML SOPN, Inject 1 mg into the skin once a week., Disp: 4.5 mL, Rfl: 1   polyethylene glycol powder (MIRALAX) 17 GM/SCOOP powder, Please take 1 capful dissolved in 8 oz of water prn constipation, Disp: 238 g, Rfl: 1   spironolactone (ALDACTONE) 25 MG tablet, TAKE 1 TABLET BY MOUTH EVERY DAY, Disp: 30 tablet, Rfl: 0   SUMAtriptan (IMITREX) 50 MG tablet, TAKE 1 TABLET BY MOUTH EVERY 2 HOURS AS NEEDED FOR MIGRAINE. MAY REPEAT in 2 hours IF HEADACHE persists OR recurs, Disp: 10 tablet, Rfl: 0   topiramate (TOPAMAX) 50 MG tablet, TAKE 1 TABLET BY MOUTH 2 TIMES DAILY, Disp: 180 tablet, Rfl: 1   VITAMIN D PO, Take 1 tablet by mouth daily. , Disp: , Rfl:    Insulin Pen Needle (ULTICARE SHORT PEN NEEDLES) 31G X 8 MM MISC, AS DIRECTED 2 TIMES DAILY TO INJECT INSULIN, Disp: 100 each, Rfl: 3   Microlet Lancets MISC, CHECK BLOOD SUGAR 3 TIMES DAILY AS DIRECTED, Disp: 200 each, Rfl: 1   zolpidem (AMBIEN) 10 MG tablet, Take 1 tablet (10 mg total) by mouth at bedtime as needed for sleep. (Patient not taking: Reported on 05/09/2021), Disp: 30 tablet, Rfl: 3   Allergies  Allergen Reactions   Tape Rash     Review of Systems  Constitutional: Negative.  Negative for fatigue.  Respiratory: Negative.    Gastrointestinal: Negative.   Genitourinary:  Negative for vaginal discharge and vaginal pain.       Vaginal itching and vaginal odor  Neurological: Negative.   Psychiatric/Behavioral: Negative.      Today's Vitals   05/09/21 0836  BP: 124/86  Pulse: 85  Temp: 98.8 F (37.1 C)  Weight: (!) 375 lb (170.1 kg)  Height: 5\' 4"  (1.626 m)   Body mass index is 64.37 kg/m.  Wt Readings from Last 3 Encounters:  05/09/21 (!) 375 lb (170.1 kg)  04/03/21 (!) 373 lb (169.2 kg)  02/12/21 (!) 386 lb 12.8 oz (175.5 kg)    BP Readings from Last 3 Encounters:  05/09/21 124/86  04/03/21 138/82  02/12/21 122/86    Objective:  Physical Exam Vitals reviewed.  Constitutional:       General: She is not in acute distress.    Appearance: Normal appearance. She is obese.  Cardiovascular:     Rate and Rhythm: Normal rate and regular rhythm.     Pulses: Normal pulses.     Heart sounds: Normal heart sounds. No murmur heard. Pulmonary:     Effort: Pulmonary effort is normal.  No respiratory distress.     Breath sounds: Normal breath sounds. No wheezing.  Neurological:     General: No focal deficit present.     Mental Status: She is alert and oriented to person, place, and time.     Cranial Nerves: No cranial nerve deficit.     Motor: No weakness.  Psychiatric:        Mood and Affect: Mood normal.        Behavior: Behavior normal.        Thought Content: Thought content normal.        Judgment: Judgment normal.        Assessment And Plan:     1. Hypercalcemia Comments: Calcium level continues to be elevated, myeloma panel done and was normal. Does not eat increased amounts of calcium. Will check PTH and phosphorus  - Phosphorus - Parathyroid Hormone, Intact w/Ca  2. Acute vaginitis Comments: Will obtain sample to check for yeast and bacterial vaginosis due to odor. Will treat for possible yeast with Diflucan - Urine cytology ancillary only  3. Vaginal odor - Urine cytology ancillary only  4. Morbid obesity with BMI of 60.0-69.9, adult (HCC) Comments: Continue at [facility] and eating healthy diet She is encouraged to strive for BMI less than 30 to decrease cardiac risk. Advised to aim for at least 150 minutes of exercise per week.     Patient was given opportunity to ask questions. Patient verbalized understanding of the plan and was able to repeat key elements of the plan. All questions were answered to their satisfaction.  [name], FNP   I, [name], FNP, have reviewed all documentation for this visit. The documentation on 05/09/21 for the exam, diagnosis, procedures, and orders are all accurate and complete.   IF YOU HAVE BEEN REFERRED TO A  SPECIALIST, IT MAY TAKE 1-2 WEEKS TO SCHEDULE/PROCESS THE REFERRAL. IF YOU HAVE NOT HEARD FROM US/SPECIALIST IN TWO WEEKS, PLEASE GIVE [place] A CALL AT [phone].   THE PATIENT IS ENCOURAGED TO PRACTICE SOCIAL DISTANCING DUE TO THE COVID-19 PANDEMIC.

## 2021-05-09 NOTE — Patient Instructions (Signed)

## 2021-05-10 ENCOUNTER — Encounter: Payer: Self-pay | Admitting: Gastroenterology

## 2021-05-10 ENCOUNTER — Ambulatory Visit (INDEPENDENT_AMBULATORY_CARE_PROVIDER_SITE_OTHER): Payer: BC Managed Care – PPO | Admitting: Gastroenterology

## 2021-05-10 VITALS — BP 122/64 | HR 86 | Ht 64.0 in | Wt 377.0 lb

## 2021-05-10 DIAGNOSIS — R1084 Generalized abdominal pain: Secondary | ICD-10-CM

## 2021-05-10 DIAGNOSIS — R194 Change in bowel habit: Secondary | ICD-10-CM | POA: Diagnosis not present

## 2021-05-10 LAB — PTH, INTACT AND CALCIUM
Calcium: 10.7 mg/dL — ABNORMAL HIGH (ref 8.7–10.2)
PTH: 79 pg/mL — ABNORMAL HIGH (ref 15–65)

## 2021-05-10 LAB — PHOSPHORUS: Phosphorus: 3.2 mg/dL (ref 3.0–4.3)

## 2021-05-10 LAB — HM DIABETES EYE EXAM
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
## 2021-05-10 NOTE — Patient Instructions (Addendum)
It was good to see you today. Congratulations on your weight loss journey!  I recommend that you eat at least 25-30 grams of fiber daily and drink at least 64 ounces of water daily. You will want to gradually increase the fiber in your diet to avoid bloating. You may increase the fiber through diet and through fiber supplements including psyllium and methycellulose.   Natural laxatives include prunes, apples, apricots, cherries, peaches, pears, aloe, rhubarb, kiwi, bananas, mango, papaya, and watermelon. In particular, two kiwi a day has been show to cause less likely to cause bloating than prunes or psyllium.  As long as you have healthy kidneys, another options is using magnesium oxide supplements. I recommend starting with 500 mg daily. You could increase the dose to 1000 mg daily after one week if that doesn't seem to be helping.   We can also consider prescription treatments for constipation if needed. Just let me know.  I'd like to see you again in 6-12 months. Please let me know if you need anything before then.  I appreciate the  opportunity to care for you  Thank You   Santo Held

## 2021-05-13 ENCOUNTER — Encounter: Payer: Self-pay | Admitting: Nurse Practitioner

## 2021-05-13 ENCOUNTER — Other Ambulatory Visit: Payer: Self-pay | Admitting: Nurse Practitioner

## 2021-05-13 ENCOUNTER — Encounter: Payer: Self-pay | Admitting: *Deleted

## 2021-05-13 ENCOUNTER — Ambulatory Visit (INDEPENDENT_AMBULATORY_CARE_PROVIDER_SITE_OTHER): Payer: BC Managed Care – PPO | Admitting: Cardiovascular Disease

## 2021-05-13 ENCOUNTER — Other Ambulatory Visit: Payer: Self-pay

## 2021-05-13 ENCOUNTER — Telehealth (HOSPITAL_BASED_OUTPATIENT_CLINIC_OR_DEPARTMENT_OTHER): Payer: Self-pay | Admitting: Cardiovascular Disease

## 2021-05-13 ENCOUNTER — Ambulatory Visit (INDEPENDENT_AMBULATORY_CARE_PROVIDER_SITE_OTHER): Payer: BC Managed Care – PPO

## 2021-05-13 ENCOUNTER — Encounter (HOSPITAL_BASED_OUTPATIENT_CLINIC_OR_DEPARTMENT_OTHER): Payer: Self-pay | Admitting: Cardiovascular Disease

## 2021-05-13 VITALS — BP 151/98 | HR 78 | Ht 64.0 in | Wt 382.7 lb

## 2021-05-13 DIAGNOSIS — Z8249 Family history of ischemic heart disease and other diseases of the circulatory system: Secondary | ICD-10-CM | POA: Diagnosis not present

## 2021-05-13 DIAGNOSIS — R002 Palpitations: Secondary | ICD-10-CM | POA: Diagnosis not present

## 2021-05-13 DIAGNOSIS — I10 Essential (primary) hypertension: Secondary | ICD-10-CM

## 2021-05-13 DIAGNOSIS — G4733 Obstructive sleep apnea (adult) (pediatric): Secondary | ICD-10-CM

## 2021-05-13 DIAGNOSIS — Z006 Encounter for examination for normal comparison and control in clinical research program: Secondary | ICD-10-CM

## 2021-05-13 DIAGNOSIS — E213 Hyperparathyroidism, unspecified: Secondary | ICD-10-CM

## 2021-05-13 HISTORY — DX: Family history of ischemic heart disease and other diseases of the circulatory system: Z82.49

## 2021-05-13 HISTORY — DX: Palpitations: R00.2

## 2021-05-13 MED ORDER — FLUCONAZOLE 150 MG PO TABS: ORAL_TABLET | ORAL | 0 refills | Status: DC

## 2021-05-13 MED ORDER — AMLODIPINE BESYLATE 5 MG PO TABS: 5.0000 mg | ORAL_TABLET | Freq: Every day | ORAL | 3 refills | Status: DC

## 2021-05-13 MED ORDER — AZILSARTAN MEDOXOMIL 80 MG PO TABS: 80.0000 mg | ORAL_TABLET | Freq: Every day | ORAL | 3 refills | Status: DC

## 2021-05-13 NOTE — Telephone Encounter (Signed)
Left message for patient to call and discuss scheduling the Echo ordered by Dr. Oval Linsey
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
## 2021-05-13 NOTE — Patient Instructions (Addendum)
Medication Instructions:  INCREASE AZILSARTAN TO 80 MG DAILY    START AMLODIPINE 5 MG DAILY    Labwork: CMET/CBC/MAGNESIUM/TSH/FT4 1 IN WEEK    Testing/Procedures: Your physician has requested that you have an echocardiogram. Echocardiography is a painless test that uses sound waves to create images of your heart. It provides your doctor with information about the size and shape of your heart and how well your heart's chambers and valves are working. This procedure takes approximately one hour. There are no restrictions for this procedure.  Emily Phelps    Follow-Up:   07/18/2021 8:30 AM WITH PHARM D AT [facility] OFFICE   09/12/2021 8:15 AM WITH DR Corydon AT [place] physician recommends that you schedule a follow-up appointment in: WITH DR Burke will receive a phone call from the PREP exercise and nutrition program to schedule an initial assessment.   Special Instructions:   MONITOR BLOOD PRESSURE TWICE A DAY WITH MACHINE GIVEN  DASH Eating Plan DASH stands for "Dietary Approaches to Stop Hypertension." The DASH eating plan is a healthy eating plan that has been shown to reduce high blood pressure (hypertension). It may also reduce your risk for type 2 diabetes, heart disease, and stroke. The DASH eating plan may also help with weight loss. What are tips for following this plan?  General guidelines Avoid eating more than 2,300 mg (milligrams) of salt (sodium) a day. If you have hypertension, you may need to reduce your sodium intake to 1,500 mg a day. Limit alcohol intake to no more than 1 drink a day for nonpregnant women and 2 drinks a day for men. One drink equals 12 oz of beer, 5 oz of wine, or 1 oz of hard liquor. Work with your health care provider to maintain a healthy body weight or to lose weight. Ask what an ideal weight is for you. Get at least 30 minutes of exercise that causes your heart to beat faster (aerobic exercise)  most days of the week. Activities may include walking, swimming, or biking. Work with your health care provider or diet and nutrition specialist (dietitian) to adjust your eating plan to your individual calorie needs. Reading food labels  Check food labels for the amount of sodium per serving. Choose foods with less than 5 percent of the Daily Value of sodium. Generally, foods with less than 300 mg of sodium per serving fit into this eating plan. To find whole grains, look for the word "whole" as the first word in the ingredient list. Shopping Buy products labeled as "low-sodium" or "no salt added." Buy fresh foods. Avoid canned foods and premade or frozen meals. Cooking Avoid adding salt when cooking. Use salt-free seasonings or herbs instead of table salt or sea salt. Check with your health care provider or pharmacist before using salt substitutes. Do not fry foods. Cook foods using healthy methods such as baking, boiling, grilling, and broiling instead. Cook with heart-healthy oils, such as olive, canola, soybean, or sunflower oil. Meal planning Eat a balanced diet that includes: 5 or more servings of fruits and vegetables each day. At each meal, try to fill half of your plate with fruits and vegetables. Up to 6-8 servings of whole grains each day. Less than 6 oz of lean meat, poultry, or fish each day. A 3-oz serving of meat is about the same size as a deck of cards. One egg equals 1 oz. 2 servings of low-fat dairy  each day. A serving of nuts, seeds, or beans 5 times each week. Heart-healthy fats. Healthy fats called Omega-3 fatty acids are found in foods such as flaxseeds and coldwater fish, like sardines, salmon, and mackerel. Limit how much you eat of the following: Canned or prepackaged foods. Food that is high in trans fat, such as fried foods. Food that is high in saturated fat, such as fatty meat. Sweets, desserts, sugary drinks, and other foods with added sugar. Full-fat dairy  products. Do not salt foods before eating. Try to eat at least 2 vegetarian meals each week. Eat more home-cooked food and less restaurant, buffet, and fast food. When eating at a restaurant, ask that your food be prepared with less salt or no salt, if possible. What foods are recommended? The items listed may not be a complete list. Talk with your dietitian about what dietary choices are best for you. Grains Whole-grain or whole-wheat bread. Whole-grain or whole-wheat pasta. Brown rice. [place]. Bulgur. Whole-grain and low-sodium cereals. Pita bread. Low-fat, low-sodium crackers. Whole-wheat flour tortillas. Vegetables Fresh or frozen vegetables (raw, steamed, roasted, or grilled). Low-sodium or reduced-sodium tomato and vegetable juice. Low-sodium or reduced-sodium tomato sauce and tomato paste. Low-sodium or reduced-sodium canned vegetables. Fruits All fresh, dried, or frozen fruit. Canned fruit in natural juice (without added sugar). Meat and other protein foods Skinless chicken or [place]. Ground chicken or [place]. Pork with fat trimmed off. Fish and seafood. Egg whites. Dried beans, peas, or lentils. Unsalted nuts, nut butters, and seeds. Unsalted canned beans. Lean cuts of beef with fat trimmed off. Low-sodium, lean deli meat. Dairy Low-fat (1%) or fat-free (skim) milk. Fat-free, low-fat, or reduced-fat cheeses. Nonfat, low-sodium ricotta or cottage cheese. Low-fat or nonfat yogurt. Low-fat, low-sodium cheese. Fats and oils Soft margarine without trans fats. Vegetable oil. Low-fat, reduced-fat, or light mayonnaise and salad dressings (reduced-sodium). Canola, safflower, olive, soybean, and sunflower oils. Avocado. Seasoning and other foods Herbs. Spices. Seasoning mixes without salt. Unsalted popcorn and pretzels. Fat-free sweets. What foods are not recommended? The items listed may not be a complete list. Talk with your dietitian about what dietary choices are best for  you. Grains Baked goods made with fat, such as croissants, muffins, or some breads. Dry pasta or rice meal packs. Vegetables Creamed or fried vegetables. Vegetables in a cheese sauce. Regular canned vegetables (not low-sodium or reduced-sodium). Regular canned tomato sauce and paste (not low-sodium or reduced-sodium). Regular tomato and vegetable juice (not low-sodium or reduced-sodium). Angie Fava. Olives. Fruits Canned fruit in a light or heavy syrup. Fried fruit. Fruit in cream or butter sauce. Meat and other protein foods Fatty cuts of meat. Ribs. Fried meat. Berniece Salines. Sausage. Bologna and other processed lunch meats. Salami. Fatback. Hotdogs. Bratwurst. Salted nuts and seeds. Canned beans with added salt. Canned or smoked fish. Whole eggs or egg yolks. Chicken or [place] with skin. Dairy Whole or 2% milk, cream, and half-and-half. Whole or full-fat cream cheese. Whole-fat or sweetened yogurt. Full-fat cheese. Nondairy creamers. Whipped toppings. Processed cheese and cheese spreads. Fats and oils Butter. Stick margarine. Lard. Shortening. Ghee. Bacon fat. Tropical oils, such as coconut, palm kernel, or palm oil. Seasoning and other foods Salted popcorn and pretzels. Onion salt, garlic salt, seasoned salt, table salt, and sea salt. Worcestershire sauce. Tartar sauce. Barbecue sauce. Teriyaki sauce. Soy sauce, including reduced-sodium. Steak sauce. Canned and packaged gravies. Fish sauce. Oyster sauce. Cocktail sauce. Horseradish that you find on the shelf. Ketchup. Mustard. Meat flavorings and tenderizers. Bouillon cubes. Hot sauce  and Tabasco sauce. Premade or packaged marinades. Premade or packaged taco seasonings. Relishes. Regular salad dressings. Where to find more information: National Heart, Lung, and [place]: https://wilson-eaton.com/ American Heart Association: www.heart.org Summary The DASH eating plan is a healthy eating plan that has been shown to reduce high blood pressure (hypertension).  It may also reduce your risk for type 2 diabetes, heart disease, and stroke. With the DASH eating plan, you should limit salt (sodium) intake to 2,300 mg a day. If you have hypertension, you may need to reduce your sodium intake to 1,500 mg a day. When on the DASH eating plan, aim to eat more fresh fruits and vegetables, whole grains, lean proteins, low-fat dairy, and heart-healthy fats. Work with your health care provider or diet and nutrition specialist (dietitian) to adjust your eating plan to your individual calorie needs. This information is not intended to replace advice given to you by your health care provider. Make sure you discuss any questions you have with your health care provider. Document Released: 06/19/2011 Document Revised: 06/12/2017 Document Reviewed: 06/23/2016 Elsevier Patient Education  2020 [employer].

## 2021-05-13 NOTE — Assessment & Plan Note (Signed)
Her maternal grandmother has a history of hypertrophic cardiomyopathy.  Emily Phelps also has a murmur on exam.  She has been experiencing palpitations as well.  We will get an echocardiogram to evaluate.  She is also getting an ambulatory monitor to make sure she is not having episodes of NSVT.  She has no family history of sudden cardiac death.  She does think that her grandmother has an ICD.  Her mother has been tested and reportedly did not have hypertrophy on her echo.

## 2021-05-13 NOTE — Progress Notes (Signed)
Called patient to make aware her PTH levels are elevated and will order a parathyroid scan, then may need to refer to General surgery Dr. Lynford Humphrey pending results

## 2021-05-13 NOTE — Research (Signed)
Emily Phelps meets criteria for the Virtual care and social determinant interventions for the management of hypertension trial. The trial was discussed with her by Dr. Oval Linsey and myself including risk and benefits. She had the opportunity to read the consent and ask questions. The consent was signed and a copy was given to her. No study related procedures were performed prior to consent being signed. She was randomized to Group 2: Remote patient monitoring.

## 2021-05-13 NOTE — Addendum Note (Signed)
Addended by: Skeet Latch C on: 05/13/2021 04:58 PM   Modules accepted: Orders

## 2021-05-13 NOTE — Assessment & Plan Note (Signed)
She has sleep apnea diagnosed by sleep study this year.  She has not been started on the CPAP.  We will refer her to our sleep medicine specialist for management.

## 2021-05-13 NOTE — Assessment & Plan Note (Signed)
New since she had COVID-19.  We will check TSH, CBC, BMP, magnesium.  We will get a 14 day monitor.  We will refer her to our sleep medicine specialist for management of her known sleep apnea.

## 2021-05-13 NOTE — Assessment & Plan Note (Signed)
Blood pressure poorly controlled on 3 agents.  She has hyperparathyroidism and elevated calcium.  Therefore we will not start a thiazide diuretic.  We will add amlodipine 5 mg.  Continue her atenolol, a losartan, and spironolactone.  We will increase the 80s losartan to 80 mg and check a comprehensive metabolic panel in a week.  She was given an advance hypertension clinic booklet and asked to track her blood pressures twice daily.  She is also interested in our remote patient monitoring study and consents to monitoring in the [place] system.  We have referred her to the PREP program to the [facility], and recommend increasing exercise to at least 150 minutes weekly.

## 2021-05-14 ENCOUNTER — Telehealth: Payer: Self-pay

## 2021-05-14 ENCOUNTER — Encounter: Payer: Self-pay | Admitting: Nurse Practitioner

## 2021-05-14 DIAGNOSIS — N938 Other specified abnormal uterine and vaginal bleeding: Secondary | ICD-10-CM | POA: Diagnosis not present

## 2021-05-14 DIAGNOSIS — Z0000 Encounter for general adult medical examination without abnormal findings: Secondary | ICD-10-CM

## 2021-05-14 DIAGNOSIS — Z6841 Body Mass Index (BMI) 40.0 and over, adult: Secondary | ICD-10-CM | POA: Diagnosis not present

## 2021-05-14 NOTE — Telephone Encounter (Signed)
Called patient for 24-hour follow up call about Vivify and to inquire about health coaching regarding increasing physical activity. Was not able to leave a message. Call could not be completed at this time/busy.    Derotha Fishbaugh Truman Hayward, [facility] Guide, Health Coach [address] [place] [address] Telephone: [phone] Email: [email or web address]

## 2021-05-15 ENCOUNTER — Encounter: Payer: Self-pay | Admitting: Nurse Practitioner

## 2021-05-15 ENCOUNTER — Other Ambulatory Visit: Payer: Self-pay | Admitting: Nurse Practitioner

## 2021-05-15 DIAGNOSIS — E119 Type 2 diabetes mellitus without complications: Secondary | ICD-10-CM

## 2021-05-15 DIAGNOSIS — Z794 Long term (current) use of insulin: Secondary | ICD-10-CM

## 2021-05-17 ENCOUNTER — Telehealth: Payer: Self-pay

## 2021-05-17 DIAGNOSIS — Z0000 Encounter for general adult medical examination without abnormal findings: Secondary | ICD-10-CM

## 2021-05-17 LAB — URINE CYTOLOGY ANCILLARY ONLY

## 2021-05-17 NOTE — Telephone Encounter (Signed)
Called patient to discuss health coaching to increase physical activity per her responses in [place] responses. Patient is interested in health coaching to increase her physical activity. Patient has been scheduled for her initial health coaching session via telephone on 11/7 at 8:15am. Patient will be emailed the [employer] and Code of Ethics. Asked patient how she was feeling due to elevated bp reading in [place]. Patient stated that she is feeling okay just tired.   Patient is also concerned about her sleep habits. Patient gets approximately 3 hours of sleep per night and take a nap during the day. Patient stated that she is always tired. Patient stated that Dr. Oval Linsey was going to look into another possible sleep study for the patient. Will follow up with Dr. Oval Linsey.     Kellis Mcadam Truman Hayward, [facility] Guide, Health Coach [address], [address] [place] [address] Telephone: [phone] Email: [email or web address]

## 2021-05-20 ENCOUNTER — Other Ambulatory Visit: Payer: Self-pay

## 2021-05-20 ENCOUNTER — Ambulatory Visit (INDEPENDENT_AMBULATORY_CARE_PROVIDER_SITE_OTHER): Payer: BC Managed Care – PPO

## 2021-05-20 DIAGNOSIS — Z0000 Encounter for general adult medical examination without abnormal findings: Secondary | ICD-10-CM

## 2021-05-20 NOTE — Progress Notes (Signed)
Appointment Outcome: Completed, Session #: Initial health coaching session Start time: 8:17am    End time: 9:00am   Total Mins: 43 minutes  AGREEMENTS SECTION    Progress Notes:  Patient stated that previously she was engaged in [place] with a Physical Therapist (PT) for a neck injury, where she learned other yoga exercises that she could incorporate at home using the [place] [employer] app that they introduced her to. Patient stated that she had continued to engage in yoga after ending her therapy sessions with her PT. Patient shared that she became discouraged with engaging in any physical activity after her medical provider stated that she needed to lose weight instead and yoga was not the answer.   Patient stated that she enjoyed yoga and that it helped her mentally as well. Patient is interested in starting to engage in yoga again in addition to other physical activity. Patient stated that when working with the PT, she was using the [employer] and [employer] app that provided a variety of exercises and poses. Patient is also interested in adding additional physical activity that would help her lose weight.   Patient is currently working on implementing a diet from [facility] MD with a 1500 daily calorie restriction. Patient stated that she is currently reading food labels to ensure that she stay within her calorie range. Patient stated that she also watches out for sodium content. Patient believes that she is not supposed to consume more than 1,500 mg of sodium per day. Patient stated that she cooks mainly at home and uses no-salt seasonings in addition to garlic and onions, cayenne pepper, crush red pepper, and hot sauce. Patient stated that she has been advised to cut back on using hot sauce, so she increases her use of different peppers to spice up her food.   Patient stated that when she eats out on occasions, she eats a salad, chicken wrap, or a chicken and egg white sandwich from  [place]. Patient stated that she drinks half unsweetened tea and lemonade to cut down on her sugar intake since she is diabetic. Patient stated that she eats with her family on Sunday. Patient stated that she has support from her family to accommodate the types of food that she can eat so that she can maintain her diet.   Patient stated that she has been having trouble with swelling in her feet and legs recently. Patient stated that she is consuming approximately 84-100 oz of fluid per day which includes mainly water because of the increase in her thirst, along with crystal light, one cup of coffee, protein shakes, and diet drinks that she was recommended to drink. Patient stated that she has lost some water weight since this past Friday. Patient is interested in losing weight and visualizes herself in a certain manner. Patient stated that is has been suggested that she have weight loss surgery, but the patient is interested in losing weight naturally.   Coaching Outcomes: Patient discussed daily schedule to determine when it would be most feasible for her to perform her physical activity. Patient agreed that between 10-10:30pm would be a decent time to engage in Yoga before going to bed so that she can wind down. Patient is interested in walking in place following a YouTube video to aid in weight loss. During the session, patient has set an alarm on her phone for 10:00pm as a reminder to start her physical activity. Patient plans to engage in 15 minutes of  yoga and 15 minutes of walking in place using the [place] app and YouTube nightly.  Patient will continue to implement a variety of steps to improve her healthy eating habits. Patient will adhere to her 1,500-calorie diet prescribed by [facility] MD. Patient will monitor sodium intake by reading food labels to minimize her consumption to 1,500 mg of sodium or less/day. Patient will also practice portion control and cook at home to minimize fast food  consumption. Patient will continue to consume 84-100 oz of water/fluid per day.   Patient discussed challenges that she may have around implementing her action steps, which includes getting discouraged after listening to what others are saying about the things that she is trying to do to improve her health. Patient will utilize her support system that is supporting her healthy eating and physical activity habits and will continue to implement positive affirmations that she is currently utilizing to encourage/motivate herself to engage in healthy eating and physical activity behaviors.   Patient's new agreement/action steps are outlined below for the next two weeks.   Overall goal(s): Increase physical activity Maintain healthy eating habits Staying positive and motivated  Agreement/Action Steps: Increasing physical activity Set alarm on phone to engage in physical activity between 10-10:30pm nightly Utilize YouTube and [employer] app for walking and yoga exercises at home 15 minutes of walking 15 minutes of Yoga Chair exercises (optional)  Maintaining healthy eating habits Follow diet restrictions from [facility] MD - 1500 calories/daily Maintain consumption of 84-100 oz of water/fluid per day Limit fast food consumption by cooking at home Monitor sodium intake by reading food labels (?1,500 mg of sodium/day) Practice portion control  Positivity/Motivation to engage in healthy eating and physical activity behaviors Utilize support system Incorporate positive affirmations

## 2021-05-24 ENCOUNTER — Other Ambulatory Visit: Payer: Self-pay | Admitting: Nurse Practitioner

## 2021-05-24 ENCOUNTER — Encounter (HOSPITAL_COMMUNITY)
Admission: RE | Admit: 2021-05-24 | Discharge: 2021-05-24 | Disposition: A | Discharge status: Home / Self Care | Payer: BC Managed Care – PPO | Source: Ambulatory Visit | Attending: Nurse Practitioner | Admitting: Nurse Practitioner

## 2021-05-24 ENCOUNTER — Other Ambulatory Visit: Payer: Self-pay

## 2021-05-24 ENCOUNTER — Other Ambulatory Visit (HOSPITAL_BASED_OUTPATIENT_CLINIC_OR_DEPARTMENT_OTHER): Payer: BC Managed Care – PPO

## 2021-05-24 DIAGNOSIS — E213 Hyperparathyroidism, unspecified: Secondary | ICD-10-CM | POA: Diagnosis not present

## 2021-05-24 DIAGNOSIS — E119 Type 2 diabetes mellitus without complications: Secondary | ICD-10-CM | POA: Diagnosis not present

## 2021-05-24 DIAGNOSIS — D351 Benign neoplasm of parathyroid gland: Secondary | ICD-10-CM | POA: Diagnosis not present

## 2021-05-24 MED ORDER — TECHNETIUM TC 99M SESTAMIBI GENERIC - CARDIOLITE
26.5000 | Freq: Once | INTRAVENOUS | Status: AC | PRN
  Administered 2021-05-24: 26.5 via INTRAVENOUS

## 2021-05-27 ENCOUNTER — Other Ambulatory Visit: Payer: Self-pay | Admitting: Nurse Practitioner

## 2021-05-27 DIAGNOSIS — I10 Essential (primary) hypertension: Secondary | ICD-10-CM

## 2021-05-30 DIAGNOSIS — Z6841 Body Mass Index (BMI) 40.0 and over, adult: Secondary | ICD-10-CM | POA: Diagnosis not present

## 2021-05-30 DIAGNOSIS — F331 Major depressive disorder, recurrent, moderate: Secondary | ICD-10-CM | POA: Diagnosis not present

## 2021-05-30 DIAGNOSIS — E559 Vitamin D deficiency, unspecified: Secondary | ICD-10-CM | POA: Diagnosis not present

## 2021-05-31 DIAGNOSIS — R002 Palpitations: Secondary | ICD-10-CM | POA: Diagnosis not present

## 2021-06-03 ENCOUNTER — Ambulatory Visit (INDEPENDENT_AMBULATORY_CARE_PROVIDER_SITE_OTHER): Payer: BC Managed Care – PPO

## 2021-06-03 ENCOUNTER — Other Ambulatory Visit: Payer: Self-pay

## 2021-06-03 DIAGNOSIS — Z0000 Encounter for general adult medical examination without abnormal findings: Secondary | ICD-10-CM

## 2021-06-03 NOTE — Progress Notes (Signed)
Appointment Outcome: Completed, Session #: 1 Start time: 8:15am   End time: 8:47am   Total Mins: 32 minutes  AGREEMENTS SECTION   Overall goal(s): Increase physical activity Maintain healthy eating habits Staying positive and motivated   Agreement/Action Steps: Increasing physical activity Set alarm on phone to engage in physical activity between 10-10:30pm nightly Utilize YouTube and [employer] app for walking and yoga exercises at home 15 minutes of walking 15 minutes of Yoga Chair exercises (optional)   Maintaining healthy eating habits Follow diet restrictions from [facility] MD - 1500 calories/daily Maintain consumption of 84-100 oz of water/fluid per day Limit fast food consumption by cooking at home Monitor sodium intake by reading food labels (?1,500 mg of sodium/day) Practice portion control   Positivity/Motivation to engage in healthy eating and physical activity behaviors Utilize support system Incorporate positive affirmations  Progress Notes:  Patient shared that she did set the alarm on her phone as a reminder to exercise. Patient stated that she ran into many challenges in the past two weeks with her schedule due to traveling, attending a funeral, and having loved ones become sick. Patient mentioned that things are calming down a little and believe she can manage all her steps, but she still feels drained from mental and emotional stress. Patient stated that she has been doing things such as parking and walking further to her destination. Patient mentioned that she did exercise one night in the past two weeks utilizing the walking in place for 15 minutes with a video from YouTube.  Patient expressed that she had a therapy session last Thursday that was helpful in helping her identify when she was emotionally eating. Patient shared that she didn't have a lot of junk food in her home but ate fruit and yogurt as snacks during this stressful period. Patient was not expecting any  weight loss but was surprised when she learned that she had lost weight.   Patient stated that while traveling, she had to settle on gas station food. Patient stated that she tried to make the healthiest choices when she could (e.g., chicken wrap). Patient stated that she did deviate from her diet some because she had chips and M&Ms. Patient sometimes eat [employer] (e.g., chicken wrap, Chicken sandwich, and egg white grilled).  Patient is cooking at home or eating at her brother's house. Patient shared that many of her immediate family members have Emily conditions where they are specific diets, and they help hold each other accountable for their eating behaviors. Patient stated that she only ate 900 calories one day and she felt full. Patient mentioned that she was instructed to eat every 2 hours, but she cannot do that because she is not hungry since taking Ozempic.   Patient stated that she does not use additional salt to season her food. Patient stated that she has seasonings with no salt such as a brand like Mrs. Dash, garlic, onion powder, black pepper, no salt Cajun seasoning, and cayenne pepper. Patient is monitoring her sodium intake by reading food labels and tracking her meals in an app by [facility] to determine the amount of sodium in her food. Patient also minimizes her sodium consumption by eating salads, baking chicken, eating vegetables.   Patient shared that she has not been able to drink 84-100 ounces of water per day. Patient reported that she drinks between 32 - 60 ounces of water per day. Patient mentioned that she is also drinking herbal and green teas, unsweetened tea, and diet  soda. Patient stated that she limits herself to soda.   Patient has been implementing positive affirmations to remain positive/motivated. Patient stated that when it comes to her eating, she often asks herself if she wants to eat because she is hungry. If not, patient stated that she would drink water and  wait to see if she still feels hungry.    Indicators of Success and Accountability:  Patient has practiced portion control by using a dessert plate and dividing up food before freezing it.  Readiness: Patient is in the action stage of increasing physical activity, maintaining healthy eating behaviors, and staying positive/motivated.  Strengths and Supports: Patient is being supported by family. Patient stated that discipline and knowing that she must do it with no other option, keeps her motivated to move forward with action steps.  Challenges and Barriers: Making her Emily behavior changes a priority when she is tired.  Coaching Outcomes: Patient felt that she was not successful at any of her steps. Reviewed with patient her progress with various steps. Patient didn't recognize at the time how much she had accomplished in the past two weeks despite her challenges.   Patient discussed how to maintain steps over the holiday and her birthday. Patient stated that she needs to apply the thought process that she has towards her diet so that exercising becomes a priority even when she is tired. Patient stated that when she is tired, she has to motivate herself.   Patient was encouraged to utilize her support system beyond accountability for healthy eating and physical activity, allowing the patient to seek support when she is overwhelmed/stressed.   Patient will continue to implement action steps as outlined above over the next two weeks. Patient changed her workout routine to include 3 days of exercise/week.   Attempted: Fulfilled - Patient has set an alarm on her phone as a reminder to exercise. Patient is reading food labels and practicing portion control. Patient is using support system for accountability and implementing positive affirmations.  Partial - Patient exercised using the walking video on YouTube once in the past two weeks. Patient has followed diet but was challenged when traveling to  make the best food choices. Patient is consuming between 32-60 oz of water daily. Patient is limiting fast food consumption by cooking at home regularly.    Emily Phelps, Emily Phelps, Emily Phelps [address] [place] [address] Telephone: [phone] Email: [email or web address]

## 2021-06-05 ENCOUNTER — Other Ambulatory Visit: Payer: Self-pay | Admitting: Nurse Practitioner

## 2021-06-07 ENCOUNTER — Other Ambulatory Visit: Payer: Self-pay | Admitting: Nurse Practitioner

## 2021-06-12 DIAGNOSIS — I10 Essential (primary) hypertension: Secondary | ICD-10-CM | POA: Diagnosis not present

## 2021-06-12 DIAGNOSIS — Z6841 Body Mass Index (BMI) 40.0 and over, adult: Secondary | ICD-10-CM | POA: Diagnosis not present

## 2021-06-12 DIAGNOSIS — E119 Type 2 diabetes mellitus without complications: Secondary | ICD-10-CM | POA: Diagnosis not present

## 2021-06-13 ENCOUNTER — Telehealth (HOSPITAL_BASED_OUTPATIENT_CLINIC_OR_DEPARTMENT_OTHER): Payer: Self-pay | Admitting: *Deleted

## 2021-06-13 NOTE — Telephone Encounter (Signed)
Left message to call back  

## 2021-06-13 NOTE — Telephone Encounter (Signed)
Received message from Carlyle D spoke to patient yesterday and was accidental, no questions

## 2021-06-13 NOTE — Telephone Encounter (Signed)
-----   Message from [name] sent at 06/12/2021  1:05 PM EST ----- Regarding: Vivify - medication questions Hi team,  This patient indicated that she has questions about her medications via [name].   Thanks, Amy

## 2021-06-15 ENCOUNTER — Other Ambulatory Visit: Payer: Self-pay | Admitting: Nurse Practitioner

## 2021-06-15 DIAGNOSIS — I10 Essential (primary) hypertension: Secondary | ICD-10-CM

## 2021-06-17 ENCOUNTER — Telehealth: Payer: Self-pay

## 2021-06-17 ENCOUNTER — Other Ambulatory Visit: Payer: Self-pay

## 2021-06-17 ENCOUNTER — Ambulatory Visit (INDEPENDENT_AMBULATORY_CARE_PROVIDER_SITE_OTHER): Payer: BC Managed Care – PPO

## 2021-06-17 DIAGNOSIS — Z0000 Encounter for general adult medical examination without abnormal findings: Secondary | ICD-10-CM

## 2021-06-17 NOTE — Telephone Encounter (Signed)
Called patient to hold health coaching session over the phone. Patient did not answer. Left message for patient to return call to hold session or to reschedule.    Santino Kinsella Truman Hayward, [facility], [facility] Guide, Health Coach [address] [place] [address] Telephone: [phone] Email: [email or web address]

## 2021-06-17 NOTE — Progress Notes (Signed)
Appointment Outcome: Completed, Session #: 2 Start time: 8:26am   End time: 8:55am   Total Mins: 29 minutes  AGREEMENTS SECTION   Overall goal(s): Increase physical activity Maintain healthy eating habits Staying positive and motivated   Agreement/Action Steps: Increasing physical activity Set alarm on phone to engage in physical activity between 10-10:30pm nightly Utilize YouTube and [employer] app for walking and yoga exercises at home 15 minutes of walking 15 minutes of Yoga Chair exercises (optional)   Maintaining healthy eating habits Follow diet restrictions from [facility] MD - 1500 calories/daily Maintain consumption of 84-100 oz of water/fluid per day Limit fast food consumption by cooking at home Monitor sodium intake by reading food labels (?1,500 mg of sodium/day) Practice portion control   Positivity/Motivation to engage in healthy eating and physical activity behaviors Utilize support system Incorporate positive affirmations  Progress Notes:  Patient expressed that she has been grieving during the past two weeks. Patient shared that this has interfered with her implementing some of her action steps. Patient tried to walk yesterday for physical activity and as a coping mechanism but was not able to do so because she was crying. Patient stated that she has been parking further from her destination so she can walk to get more steps in.  Patient stated that she has not had much of an appetite in the past few days and have not eaten much. Patient mentioned that she is drinking two 28 oz bottles of water per day (56 oz of water per day). Patient stated that she has been drinking zero sugar minute maid juice, which is not normal for her. Patient has not consumed fast food or junk food during this time.   Patient reported that she is familiar with the food labels on the foods that she has in her home. Patient stated that she has no- or low-sodium food and seasonings. Patient shared  that she uses a lot of garlic, onions, and black pepper as seasons. Patient is practicing portion control by continuing to use a dessert plate or a bowls that are designed for portion control. Patient stated that she cooks as if she is cooking for a family and divide up the food for multiple meals (meal prepping) to control how much she eats per meal.   Patient has been utilizing her support system during the past two weeks. Patient shared that her family is calling to check on her. Patient mentioned that her brother has been encouraging her to eat and even cooked for her. Patient stated that moments when she is feeling overwhelmed, she will take deep breathes to calm down, while implementing positive self-talking (telling herself that everything will be okay), and taking time away from her phone to give herself an emotional break.      Indicators of Success and Accountability:  Patient has been able to implement stress management techniques that she thought on her own.  Readiness: Patient is in the action phase of increasing physical activity, maintaining healthy eating habits, and staying positive/motivated.  Strengths and Supports: Patient is being supported by her family. Patient strength lies in remaining focused.  Challenges and Barriers: Patient is grieving at this time, which may interfere with implementing some of her action steps over the next two weeks.   Coaching Outcomes: Patient stated that during her time of grieving, it is still important for her to implement her action steps.   Patient decided to engage in physical activity three times a week using YouTube or the  [employer] app for walking and yoga.   Patient will work on adding food and water as her appetite returns.   Patient has been managing stressful and overwhelming moments by taking deep breathes, implementing positive self-talk and taking emotional breaks when needed.   Patient will implement the following action steps over  the next two weeks as outlined below.   Agreement/Action Steps: Increasing physical activity Set alarm on phone to engage in physical activity between 10-10:30pm 3xs/week Utilize YouTube and [employer] app for walking and yoga exercises at home 15 minutes of walking 15 minutes of Yoga Chair exercises (optional)   Maintaining healthy eating habits Follow diet restrictions from [facility] MD - 1500 calories/daily Maintain consumption of 84-100 oz of water/fluid per day Limit fast food consumption by cooking at home Monitor sodium intake by reading food labels (?1,500 mg of sodium/day) Practice portion control   Positivity/Motivation to engage in healthy eating and physical activity behaviors Utilize support system Incorporate positive affirmations and positive self-talk Deep breathing Take an emotional break when needed   Attempted: Fulfilled - Patient is utilizing her support system and incorporating positive affirmations. Patient has an alarm set for physical activity. Patient has limited fast food consumption, is monitoring her sodium intake by reading food labels and practicing portion control. Partial - Patient attempted to walk during this time but had to stop due to feeling overwhelmed. Patient has not been eating consistently throughout the day in the past few days. Patient is drinking approximately 56 oz of water per day.

## 2021-06-18 DIAGNOSIS — Z6841 Body Mass Index (BMI) 40.0 and over, adult: Secondary | ICD-10-CM | POA: Diagnosis not present

## 2021-06-18 DIAGNOSIS — K760 Fatty (change of) liver, not elsewhere classified: Secondary | ICD-10-CM | POA: Diagnosis not present

## 2021-06-20 ENCOUNTER — Other Ambulatory Visit: Payer: Self-pay

## 2021-06-20 ENCOUNTER — Encounter: Payer: Self-pay | Admitting: Nurse Practitioner

## 2021-06-20 ENCOUNTER — Ambulatory Visit: Payer: BC Managed Care – PPO | Admitting: Nurse Practitioner

## 2021-06-20 ENCOUNTER — Ambulatory Visit (INDEPENDENT_AMBULATORY_CARE_PROVIDER_SITE_OTHER): Payer: BC Managed Care – PPO | Admitting: Nurse Practitioner

## 2021-06-20 ENCOUNTER — Ambulatory Visit (INDEPENDENT_AMBULATORY_CARE_PROVIDER_SITE_OTHER): Payer: BC Managed Care – PPO

## 2021-06-20 VITALS — BP 132/80 | HR 81 | Temp 98.1°F | Ht 64.0 in | Wt 373.4 lb

## 2021-06-20 DIAGNOSIS — R002 Palpitations: Secondary | ICD-10-CM

## 2021-06-20 DIAGNOSIS — E213 Hyperparathyroidism, unspecified: Secondary | ICD-10-CM | POA: Diagnosis not present

## 2021-06-20 DIAGNOSIS — Z8249 Family history of ischemic heart disease and other diseases of the circulatory system: Secondary | ICD-10-CM | POA: Diagnosis not present

## 2021-06-20 DIAGNOSIS — E7800 Pure hypercholesterolemia, unspecified: Secondary | ICD-10-CM | POA: Diagnosis not present

## 2021-06-20 DIAGNOSIS — E119 Type 2 diabetes mellitus without complications: Secondary | ICD-10-CM

## 2021-06-20 DIAGNOSIS — R011 Cardiac murmur, unspecified: Secondary | ICD-10-CM | POA: Diagnosis not present

## 2021-06-20 DIAGNOSIS — I10 Essential (primary) hypertension: Secondary | ICD-10-CM

## 2021-06-20 DIAGNOSIS — R9389 Abnormal findings on diagnostic imaging of other specified body structures: Secondary | ICD-10-CM

## 2021-06-20 DIAGNOSIS — Z794 Long term (current) use of insulin: Secondary | ICD-10-CM

## 2021-06-20 DIAGNOSIS — E1169 Type 2 diabetes mellitus with other specified complication: Secondary | ICD-10-CM

## 2021-06-20 DIAGNOSIS — Z23 Encounter for immunization: Secondary | ICD-10-CM

## 2021-06-20 LAB — ECHOCARDIOGRAM COMPLETE
AR max vel: 1.93 cm2
AV Area VTI: 1.91 cm2
AV Area mean vel: 1.89 cm2
AV Mean grad: 6 mmHg
AV Peak grad: 12.5 mmHg
Ao pk vel: 1.77 m/s
Area-P 1/2: 3.28 cm2
Calc EF: 68.8 %
Height: 64 in
S' Lateral: 1.8 cm
Single Plane A2C EF: 62.9 %
Single Plane A4C EF: 71.8 %
Weight: 5974.4 oz

## 2021-06-20 MED ORDER — PNEUMOCOCCAL 20-VAL CONJ VACC 0.5 ML IM SUSY: 0.5000 mL | PREFILLED_SYRINGE | INTRAMUSCULAR | 0 refills | Status: AC

## 2021-06-20 NOTE — Patient Instructions (Signed)

## 2021-06-20 NOTE — Progress Notes (Signed)
I,Victoria T Hamilton,acting as a Education administrator for [name], FNP.,have documented all relevant documentation on the behalf of [name], FNP,as directed by  [name], FNP while in the presence of [name], [place].   This visit occurred during the SARS-CoV-2 public health emergency.  Safety protocols were in place, including screening questions prior to the visit, additional usage of staff PPE, and extensive cleaning of exam room while observing appropriate contact time as indicated for disinfecting solutions.  Subjective:     Patient ID: Emily Phelps , female    DOB: Mar 12, 1981 , 40 y.o.   MRN: [id number]   Chief Complaint  Patient presents with   Diabetes   Hypertension    HPI  Patient presents today for a bp and diabetes check. [place], she has been dealing with an increased number of deaths with her most recent her Matriach of the family. She is exercising with a walking program 3 days a week  Wt Readings from Last 3 Encounters: 06/20/21 : (!) 373 lb 6.4 oz (169.4 kg) 05/13/21 : (!) 382 lb 11.2 oz (173.6 kg) 05/10/21 : (!) 377 lb (171 kg)     Diabetes She presents for her follow-up diabetic visit. She has type 2 diabetes mellitus. There are no hypoglycemic associated symptoms. Hypoglycemia symptoms include headaches. There are no diabetic associated symptoms. Pertinent negatives for diabetes include no chest pain. There are no hypoglycemic complications. There are no diabetic complications. Risk factors for coronary artery disease include obesity and sedentary lifestyle. Current diabetic treatment includes oral agent (dual therapy). She is compliant with treatment all of the time. Diabetic current diet: she has been doing a keto diet. When asked about meal planning, she reported none. She has not had a previous visit with a dietitian. (Blood sugar has been below 70 sometimes she will not have an appetite. She is no longer on insulin) An ACE  inhibitor/angiotensin II receptor blocker is being taken. She does not see a podiatrist.Eye exam is not current.  Hypertension This is a chronic problem. The current episode started more than 1 year ago. The problem is unchanged. The problem is controlled. Associated symptoms include headaches. Pertinent negatives include no anxiety, chest pain or palpitations. Risk factors for coronary artery disease include obesity and diabetes mellitus. There is no history of chronic renal disease.  Headache  This is a chronic problem. The current episode started more than 1 year ago. The problem occurs intermittently (having at least 4 times a week). The pain is located in the Temporal (temples and behind her eyes) region. The quality of the pain is described as aching. Pertinent negatives include no abdominal pain. She has tried nothing for the symptoms. Her past medical history is significant for hypertension.    Past Medical History:  Diagnosis Date   Allergy    Diabetes mellitus ([place])    Family history of adverse reaction to anesthesia    mother had n/v after    Family history of hypertrophic cardiomyopathy 05/13/2021   GERD (gastroesophageal reflux disease)    Hyperlipidemia    Hypertension    Migraine    Obesity    Palpitations 05/13/2021   Rectal bleeding    Resistant hypertension 11/28/2014   Wears contact lenses      Family History  Problem Relation Age of Onset   Hypertension Mother    Colon polyps Mother    Diabetes Father    Hypertension Father    Prostate cancer Father  Hypertension Brother    Thyroid disease Maternal Aunt    Heart disease Maternal Uncle    Kidney failure Maternal Uncle    Hypertension Maternal Grandmother    Heart disease Maternal Grandmother    Hypertrophic cardiomyopathy Maternal Grandmother    Colon cancer Neg Hx    Esophageal cancer Neg Hx    Liver cancer Neg Hx    Stomach cancer Neg Hx    Rectal cancer Neg Hx      Current Outpatient Medications:     albuterol (VENTOLIN HFA) 108 (90 Base) MCG/ACT inhaler, Inhale 2 puffs into the lungs every 6 (six) hours as needed for wheezing or shortness of breath., Disp: 6.7 g, Rfl: 0   amLODipine (NORVASC) 5 MG tablet, Take 1 tablet (5 mg total) by mouth daily., Disp: 90 tablet, Rfl: 3   atenolol (TENORMIN) 50 MG tablet, TAKE 1 TABLET BY MOUTH EVERY DAY, Disp: 90 tablet, Rfl: 6   Azilsartan Medoxomil 80 MG TABS, Take 1 tablet (80 mg total) by mouth daily., Disp: 90 tablet, Rfl: 3   Continuous Blood Gluc Receiver ([place]) DEVI, Use to check blood sugars dx code e11.65, Disp: 3 each, Rfl: 3   Continuous Blood Gluc Sensor (DEXCOM G6 SENSOR) MISC, USE TO CHECK BLOOD SUGAR. CHANGE EVERY 10 DAYS, Disp: 3 each, Rfl: 0   Continuous Blood Gluc Transmit (DEXCOM G6 TRANSMITTER) MISC, USE TO CHECK BLOOD SUGAR. CHANGE EVERY 90 DAYS, Disp: 1 each, Rfl: 3   CONTOUR NEXT TEST test strip, USE TO CHECK BLOOD SUGAR 3 TIMES DAILY AS DIRECTED, Disp: 200 strip, Rfl: 1   Cyanocobalamin (VITAMIN B12) 1000 MCG TBCR, Take 1,000 mcg by mouth daily. , Disp: , Rfl:    dicyclomine (BENTYL) 10 MG capsule, Take 1 capsule (10 mg total) by mouth 4 (four) times daily as needed for spasms., Disp: 30 capsule, Rfl: 2   FARXIGA 5 MG TABS tablet, TAKE 1 TABLET BY MOUTH EVERY DAY BEFORE BREAKFAST, Disp: 90 tablet, Rfl: 1   fexofenadine (ALLEGRA) 180 MG tablet, TAKE 1 TABLET BY MOUTH EVERY DAY, Disp: 30 tablet, Rfl: 1   fluticasone (FLONASE) 50 MCG/ACT nasal spray, Place 2 sprays into both nostrils daily as needed for allergies., Disp: , Rfl:    ibuprofen (ADVIL) 200 MG tablet, Take 200 mg by mouth every 6 (six) hours as needed for moderate pain., Disp: , Rfl:    Insulin Pen Needle (ULTICARE SHORT PEN NEEDLES) 31G X 8 MM MISC, AS DIRECTED 2 TIMES DAILY TO INJECT INSULIN, Disp: 100 each, Rfl: 3   Microlet Lancets MISC, CHECK BLOOD SUGAR 3 TIMES DAILY AS DIRECTED, Disp: 200 each, Rfl: 1   Multiple Vitamin (MULTIVITAMIN WITH MINERALS)  TABS tablet, Take 1 tablet by mouth daily., Disp: , Rfl:    omeprazole (PRILOSEC) 40 MG capsule, Take 1 capsule (40 mg total) by mouth 2 (two) times daily., Disp: 180 capsule, Rfl: 3   ondansetron (ZOFRAN ODT) 4 MG disintegrating tablet, Take 1 tablet (4 mg total) by mouth every 8 (eight) hours as needed for nausea or vomiting., Disp: 20 tablet, Rfl: 0   OZEMPIC, 1 MG/DOSE, 4 MG/3ML SOPN, Inject 1 mg into the skin once a week., Disp: 4.5 mL, Rfl: 1   polyethylene glycol powder (MIRALAX) 17 GM/SCOOP powder, Please take 1 capful dissolved in 8 oz of water prn constipation, Disp: 238 g, Rfl: 1   spironolactone (ALDACTONE) 25 MG tablet, TAKE 1 TABLET BY MOUTH EVERY DAY, Disp: 30 tablet, Rfl: 0  SUMAtriptan (IMITREX) 50 MG tablet, TAKE 1 TABLET BY MOUTH EVERY 2 HOURS AS NEEDED FOR MIGRAINE. MAY REPEAT in 2 hours IF HEADACHE persists OR recurs, Disp: 10 tablet, Rfl: 0   topiramate (TOPAMAX) 50 MG tablet, TAKE 1 TABLET BY MOUTH 2 TIMES DAILY, Disp: 180 tablet, Rfl: 1   VITAMIN D PO, Take 1 tablet by mouth daily. , Disp: , Rfl:    fluconazole (DIFLUCAN) 150 MG tablet, Take 1 tab now then repeat in 5 days. (Patient not taking: Reported on 06/20/2021), Disp: 2 tablet, Rfl: 0   norethindrone (MICRONOR) 0.35 MG tablet, 1 tablet (Patient not taking: Reported on 06/20/2021), Disp: , Rfl:    zolpidem (AMBIEN) 10 MG tablet, Take 1 tablet (10 mg total) by mouth at bedtime as needed for sleep. (Patient not taking: No sig reported), Disp: 30 tablet, Rfl: 3   Allergies  Allergen Reactions   Tape Rash     Review of Systems  Constitutional: Negative.   Respiratory: Negative.    Cardiovascular: Negative.  Negative for chest pain, palpitations and leg swelling.  Gastrointestinal:  Negative for abdominal pain.  Neurological: Negative.  Positive for headaches.  Psychiatric/Behavioral: Negative.      Today's Vitals   06/20/21 1408  BP: 132/80  Pulse: 81  Temp: 98.1 F (36.7 C)  Weight: (!) 373 lb 6.4 oz (169.4  kg)  Height: '5\' 4"'  (1.626 m)  PainSc: 0-No pain   Body mass index is 64.09 kg/m.  Wt Readings from Last 3 Encounters:  06/20/21 (!) 373 lb 6.4 oz (169.4 kg)  05/13/21 (!) 382 lb 11.2 oz (173.6 kg)  05/10/21 (!) 377 lb (171 kg)    Objective:  Physical Exam Vitals reviewed.  Constitutional:      General: She is not in acute distress.    Appearance: Normal appearance. She is obese.  Cardiovascular:     Rate and Rhythm: Normal rate and regular rhythm.     Pulses: Normal pulses.     Heart sounds: Normal heart sounds. No murmur heard. Pulmonary:     Effort: Pulmonary effort is normal. No respiratory distress.     Breath sounds: Normal breath sounds. No wheezing.  Skin:    General: Skin is warm and dry.     Capillary Refill: Capillary refill takes less than 2 seconds.     Coloration: Skin is not jaundiced.  Neurological:     General: No focal deficit present.     Mental Status: She is alert and oriented to person, place, and time.     Cranial Nerves: No cranial nerve deficit.     Motor: No weakness.  Psychiatric:        Mood and Affect: Mood normal.        Behavior: Behavior normal.        Thought Content: Thought content normal.        Judgment: Judgment normal.        Assessment And Plan:     1. Controlled type 2 diabetes mellitus without complication, with long-term current use of insulin (HCC) Comments: HgbA1c is improving significantly, continue current medications  2. Essential hypertension Comments: Controlled, continue current medications  3. Elevated cholesterol Comments: Stable, continue current medications. Tolerating well. Continue following a low fat diet. - Lipid panel  4. Hyperparathyroidism ([place]) - TSH - T4 - CBC with Differential/Platelet - CMP14+EGFR - Magnesium  5. Abnormal ultrasound of parathyroid gland Comments: Will refer to General Surgery for further evaluation - Ambulatory referral to General Surgery  6. Immunization due -  pneumococcal 20-valent conjugate vaccine (PREVNAR 20) 0.5 ML injection; Inject 0.5 mLs into the muscle tomorrow at 10 am for 1 dose.  Dispense: 0.5 mL; Refill: 0     Patient was given opportunity to ask questions. Patient verbalized understanding of the plan and was able to repeat key elements of the plan. All questions were answered to their satisfaction.  [name], FNP   I, [name], FNP, have reviewed all documentation for this visit. The documentation on 06/22/21 for the exam, diagnosis, procedures, and orders are all accurate and complete.   IF YOU HAVE BEEN REFERRED TO A SPECIALIST, IT MAY TAKE 1-2 WEEKS TO SCHEDULE/PROCESS THE REFERRAL. IF YOU HAVE NOT HEARD FROM US/SPECIALIST IN TWO WEEKS, PLEASE GIVE [place] A CALL AT [phone].   THE PATIENT IS ENCOURAGED TO PRACTICE SOCIAL DISTANCING DUE TO THE COVID-19 PANDEMIC.

## 2021-06-21 LAB — CBC WITH DIFFERENTIAL/PLATELET
Basophils Absolute: 0.1 10*3/uL (ref 0.0–0.2)
Basos: 1 %
EOS (ABSOLUTE): 0.2 10*3/uL (ref 0.0–0.4)
Eos: 2 %
Hematocrit: 39.9 % (ref 34.0–46.6)
Hemoglobin: 13.1 g/dL (ref 11.1–15.9)
Immature Grans (Abs): 0.1 10*3/uL (ref 0.0–0.1)
Immature Granulocytes: 1 %
Lymphocytes Absolute: 3.3 10*3/uL — ABNORMAL HIGH (ref 0.7–3.1)
Lymphs: 30 %
MCH: 25.2 pg — ABNORMAL LOW (ref 26.6–33.0)
MCHC: 32.8 g/dL (ref 31.5–35.7)
MCV: 77 fL — ABNORMAL LOW (ref 79–97)
Monocytes Absolute: 0.7 10*3/uL (ref 0.1–0.9)
Monocytes: 6 %
Neutrophils Absolute: 6.8 10*3/uL (ref 1.4–7.0)
Neutrophils: 60 %
Platelets: 490 10*3/uL — ABNORMAL HIGH (ref 150–450)
RBC: 5.19 x10E6/uL (ref 3.77–5.28)
RDW: 16.8 % — ABNORMAL HIGH (ref 11.7–15.4)
WBC: 11.1 10*3/uL — ABNORMAL HIGH (ref 3.4–10.8)

## 2021-06-21 LAB — CMP14+EGFR
ALT: 21 IU/L (ref 0–32)
AST: 16 IU/L (ref 0–40)
Albumin/Globulin Ratio: 1.6 (ref 1.2–2.2)
Albumin: 4.5 g/dL (ref 3.8–4.8)
Alkaline Phosphatase: 131 IU/L — ABNORMAL HIGH (ref 44–121)
BUN/Creatinine Ratio: 16 (ref 9–23)
BUN: 13 mg/dL (ref 6–20)
Bilirubin Total: 0.4 mg/dL (ref 0.0–1.2)
CO2: 22 mmol/L (ref 20–29)
Calcium: 11 mg/dL — ABNORMAL HIGH (ref 8.7–10.2)
Chloride: 104 mmol/L (ref 96–106)
Creatinine, Ser: 0.79 mg/dL (ref 0.57–1.00)
Globulin, Total: 2.9 g/dL (ref 1.5–4.5)
Glucose: 81 mg/dL (ref 70–99)
Potassium: 4.4 mmol/L (ref 3.5–5.2)
Sodium: 138 mmol/L (ref 134–144)
Total Protein: 7.4 g/dL (ref 6.0–8.5)
eGFR: 99 mL/min/{1.73_m2} (ref >59)

## 2021-06-21 LAB — T4: T4, Total: 6.3 ug/dL (ref 4.5–12.0)

## 2021-06-21 LAB — LIPID PANEL
Chol/HDL Ratio: 5.5 ratio — ABNORMAL HIGH (ref 0.0–4.4)
Cholesterol, Total: 192 mg/dL (ref 100–199)
HDL: 35 mg/dL — ABNORMAL LOW (ref >39)
LDL Chol Calc (NIH): 129 mg/dL — ABNORMAL HIGH (ref 0–99)
Triglycerides: 153 mg/dL — ABNORMAL HIGH (ref 0–149)
VLDL Cholesterol Cal: 28 mg/dL (ref 5–40)

## 2021-06-21 LAB — TSH: TSH: 1.49 u[IU]/mL (ref 0.450–4.500)

## 2021-06-21 LAB — MAGNESIUM: Magnesium: 2.2 mg/dL (ref 1.6–2.3)

## 2021-06-25 ENCOUNTER — Institutional Professional Consult (permissible substitution): Payer: BC Managed Care – PPO | Admitting: Cardiology

## 2021-06-26 ENCOUNTER — Encounter: Payer: Self-pay | Admitting: Nurse Practitioner

## 2021-07-01 ENCOUNTER — Other Ambulatory Visit: Payer: Self-pay

## 2021-07-01 ENCOUNTER — Other Ambulatory Visit (INDEPENDENT_AMBULATORY_CARE_PROVIDER_SITE_OTHER): Payer: BC Managed Care – PPO

## 2021-07-01 DIAGNOSIS — Z20822 Contact with and (suspected) exposure to covid-19: Secondary | ICD-10-CM | POA: Diagnosis not present

## 2021-07-01 DIAGNOSIS — I10 Essential (primary) hypertension: Secondary | ICD-10-CM | POA: Diagnosis not present

## 2021-07-02 ENCOUNTER — Ambulatory Visit (INDEPENDENT_AMBULATORY_CARE_PROVIDER_SITE_OTHER): Payer: BC Managed Care – PPO

## 2021-07-02 DIAGNOSIS — Z0000 Encounter for general adult medical examination without abnormal findings: Secondary | ICD-10-CM

## 2021-07-02 NOTE — Progress Notes (Signed)
Appointment Outcome: Completed, Session #: 3 Start time: 8:16am   End time: 8:46pm   Total Mins: 30 minutes  AGREEMENTS SECTION     Overall goal(s): Increase physical activity Maintain healthy eating habits Staying positive and motivated                                              Agreement/Action Steps: Increasing physical activity Set alarm on phone to engage in physical activity between 10-10:30pm 3xs/week [employer] app for walking and yoga exercises at home 15 minutes of walking 15 minutes of Yoga Chair exercises (optional)   Maintaining healthy eating habits Follow diet restrictions from [facility] - 1500 calories/daily Maintain consumption of 84-100 oz of water/fluid per day Limit fast food consumption by cooking at home Monitor sodium intake by reading food labels (?1,500 mg of sodium/day) Practice portion control   Positivity/Motivation to engage in healthy eating and physical activity behaviors Utilize support system Incorporate positive affirmations and positive self-talk Deep breathing Take an emotional break when needed   Progress Notes:  Patient stated that she has not been able to adhere to her exercise routine because she has been traveling in the past two weeks and have been grieving the loss of her grandmother. Patient reported that she has been walking more steps each day when on vacation and during other traveling.   Patient stated that her eating habits changed during this time because she had to eat on the go while traveling. Patient stated that she made the healthiest food choices that she could while traveling. Patient usually do not eat out regularly but have done so frequently because she has not been in the mood to cook after returning home. Patient shared that she has gained 10 pounds during this time.   Patient has been utilizing her support system during this time. Patient did mention that even with support and remaining positive,  she finds that she does not want to return to work or leave the house.   Patient is concerned about her health and stated that she keeps this at the forefront. Patient stated that she needs to have a mass removed from her thyroid. Patient stated that she has been working on staying positive by praying and saying positive affirmations. Patient mentioned that she is taking deep breaths and not suppressing her emotions. Patient is taking emotional breaks, when necessary, which has been helpful to slow down. Patient has also incorporated rest as part of her care.     Indicators of Success and Accountability:  Patient has been able to implement steps to remain positive and made the best food choices while traveling in the past two weeks.  Readiness: Patient is in the action phase of increasing physical activity, maintaining healthy eating habits, and staying positive/motivated.  Strengths and Supports: Patient is being supported by her family.  Challenges and Barriers: Patient is grieving and may be a challenge to implementing her action steps.    Coaching Outcomes: Patient stated that she will be able to adhere to her diet since she is settled back home. Patient was in the process of cooking breakfast to get back on track with eating instead of eating out this morning.  Patient stated that she will resume her exercise routine today.   Patient expressed that she is trying to utilize her bereavement and sick time  appropriately and therefore, will return to return to work today.  Patient will continue to implement action steps as outlined above over the next two weeks.   Attempted: Fulfilled - Patient was able to remain positive by utilizing her support system, incorporating affirmations and positive self-talk, deep breathing, and taking emotional breaks when needed.   Not met - Patient did not exercise in the past two weeks and was not able to adhere to her steps to maintain healthy eating habits.

## 2021-07-03 DIAGNOSIS — Z6841 Body Mass Index (BMI) 40.0 and over, adult: Secondary | ICD-10-CM | POA: Diagnosis not present

## 2021-07-03 DIAGNOSIS — E559 Vitamin D deficiency, unspecified: Secondary | ICD-10-CM | POA: Diagnosis not present

## 2021-07-09 ENCOUNTER — Other Ambulatory Visit: Payer: Self-pay | Admitting: Nurse Practitioner

## 2021-07-09 DIAGNOSIS — Z794 Long term (current) use of insulin: Secondary | ICD-10-CM

## 2021-07-09 DIAGNOSIS — M9904 Segmental and somatic dysfunction of sacral region: Secondary | ICD-10-CM | POA: Diagnosis not present

## 2021-07-09 DIAGNOSIS — M9905 Segmental and somatic dysfunction of pelvic region: Secondary | ICD-10-CM | POA: Diagnosis not present

## 2021-07-09 DIAGNOSIS — M9903 Segmental and somatic dysfunction of lumbar region: Secondary | ICD-10-CM | POA: Diagnosis not present

## 2021-07-09 DIAGNOSIS — M9902 Segmental and somatic dysfunction of thoracic region: Secondary | ICD-10-CM | POA: Diagnosis not present

## 2021-07-11 DIAGNOSIS — M9905 Segmental and somatic dysfunction of pelvic region: Secondary | ICD-10-CM | POA: Diagnosis not present

## 2021-07-11 DIAGNOSIS — M9904 Segmental and somatic dysfunction of sacral region: Secondary | ICD-10-CM | POA: Diagnosis not present

## 2021-07-11 DIAGNOSIS — M9902 Segmental and somatic dysfunction of thoracic region: Secondary | ICD-10-CM | POA: Diagnosis not present

## 2021-07-11 DIAGNOSIS — M9903 Segmental and somatic dysfunction of lumbar region: Secondary | ICD-10-CM | POA: Diagnosis not present

## 2021-07-16 ENCOUNTER — Other Ambulatory Visit: Payer: Self-pay

## 2021-07-16 ENCOUNTER — Ambulatory Visit (INDEPENDENT_AMBULATORY_CARE_PROVIDER_SITE_OTHER): Payer: BC Managed Care – PPO

## 2021-07-16 DIAGNOSIS — Z0000 Encounter for general adult medical examination without abnormal findings: Secondary | ICD-10-CM
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
## 2021-07-17 LAB — POCT URINALYSIS DIPSTICK
Bilirubin, UA: NEGATIVE
Blood, UA: NEGATIVE
Glucose, UA: NEGATIVE
Ketones, UA: NEGATIVE
Leukocytes, UA: NEGATIVE
Nitrite, UA: NEGATIVE
Protein, UA: NEGATIVE
Spec Grav, UA: 1.01 (ref 1.010–1.025)
Urobilinogen, UA: 0.2 E.U./dL
pH, UA: 7 (ref 5.0–8.0)

## 2021-07-17 LAB — POCT UA - MICROALBUMIN
Creatinine, POC: 50 mg/dL
Microalbumin Ur, POC: 10 mg/L

## 2021-07-18 ENCOUNTER — Other Ambulatory Visit: Payer: Self-pay

## 2021-07-18 ENCOUNTER — Ambulatory Visit: Payer: BC Managed Care – PPO | Admitting: Pharmacist

## 2021-07-18 VITALS — BP 135/88 | HR 82 | Resp 18 | Ht 64.0 in

## 2021-07-18 DIAGNOSIS — I10 Essential (primary) hypertension: Secondary | ICD-10-CM | POA: Diagnosis not present
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
## 2021-07-18 NOTE — Patient Instructions (Addendum)
It was nice meeting you today  We would like your blood pressure to be less than 130/80  Continue your   Edarbi 80mg  daily Spironolactone 25mg  daily Amlodipine 5mg  daily Atenolol 50mg  daily   If your copay for Earnest Rosier is expensive, let me know and we can change it to [place] to watch your diet and avoid salt.  Continue to check your blood pressure at home.    We will see you back in 1 month and if your blood pressure is still elevated we can consider increasing your amlodipine  Please call with any questions!  Karren Cobble, PharmD, BCACP, [place], [place], [place], [place], [address] Phone: [phone], Fax: [phone]

## 2021-07-24 NOTE — Telephone Encounter (Signed)
Attempted to call patient regarding morning BP of 157/103. Unable to reach patient. Left message. Will attempt to call.

## 2021-07-26 ENCOUNTER — Telehealth: Payer: Self-pay | Admitting: *Deleted

## 2021-07-26 NOTE — Telephone Encounter (Signed)
Attempted to call patient regarding BP readings of 158/103 last evening and 156/112 this morning. Message on phone stated "unable to complete call." Sent text to patient through the [employer] portal. Will alert Dr Oval Linsey of patient's response.

## 2021-07-29 ENCOUNTER — Telehealth: Payer: Self-pay

## 2021-07-31 ENCOUNTER — Ambulatory Visit (INDEPENDENT_AMBULATORY_CARE_PROVIDER_SITE_OTHER): Payer: BC Managed Care – PPO

## 2021-07-31 ENCOUNTER — Other Ambulatory Visit: Payer: Self-pay

## 2021-07-31 DIAGNOSIS — Z0000 Encounter for general adult medical examination without abnormal findings: Secondary | ICD-10-CM

## 2021-07-31 NOTE — Patient Instructions (Signed)
Managing Loss, Adult °People experience loss in many different ways throughout their lives. Events such as moving, changing jobs, and losing friends can create a sense of loss. The loss may be as serious as a major health change, divorce, death of a pet, or death of a loved one. All of these types of loss are likely to create a physical and emotional reaction known as grief. Grief is the result of a major change or an absence of something or someone that you count on. Grief is a normal reaction to loss. °A variety of factors can affect your grieving experience, including: °The nature of your loss. °Your relationship to what or whom you lost. °Your understanding of grief and how to manage it. °Your support system. °Be aware that when grief becomes extreme, it can lead to more severe issues like isolation, depression, anxiety, or suicidal thoughts. Talk with your health care provider if you have any of these issues. °How to manage lifestyle changes °Keep to your normal routine as much as possible. °If you have trouble focusing or doing normal activities, it is acceptable to take some time away from your normal routine. °Spend time with friends and loved ones. °Eat a healthy diet, get plenty of sleep, and rest when you feel tired. °How to recognize changes  °The way that you deal with your grief will affect your ability to function as you normally do. When grieving, you may experience these changes: °Numbness, shock, sadness, anxiety, anger, denial, and guilt. °Thoughts about death. °Unexpected crying. °A physical sensation of emptiness in your stomach. °Problems sleeping and eating. °Tiredness (fatigue). °Loss of interest in normal activities. °Dreaming about or imagining seeing the person who died. °A need to remember what or whom you lost. °Difficulty thinking about anything other than your loss for a period of time. °Relief. If you have been expecting the loss for a while, you may feel a sense of relief when it  happens. °Follow these instructions at home: °Activity °Express your feelings in healthy ways, such as: °Talking with others about your loss. It may be helpful to find others who have had a similar loss, such as a support group. °Writing down your feelings in a journal. °Doing physical activities to release stress and emotional energy. °Doing creative activities like painting, sculpting, or playing or listening to music. °Practicing resilience. This is the ability to recover and adjust after facing challenges. Reading some resources that encourage resilience may help you to learn ways to practice those behaviors. ° °General instructions °Be patient with yourself and others. Allow the grieving process to happen, and remember that grieving takes time. °It is likely that you may never feel completely done with some grief. You may find a way to move on while still cherishing memories and feelings about your loss. °Accepting your loss is a process. It can take months or longer to adjust. °Keep all follow-up visits. This is important. °Where to find support °To get support for managing loss: °Ask your health care provider for help and recommendations, such as grief counseling or therapy. °Think about joining a support group for people who are managing a loss. °Where to find more information °You can find more information about managing loss from: °American Society of Clinical Oncology: www.cancer.net °American Psychological Association: www.apa.org °Contact a health care provider if: °Your grief is extreme and keeps getting worse. °You have ongoing grief that does not improve. °Your body shows symptoms of grief, such as illness. °You feel depressed, anxious, or   hopeless. °Get help right away if: °You have thoughts about hurting yourself or others. °Get help right away if you feel like you may hurt yourself or others, or have thoughts about taking your own life. Go to your nearest emergency room or: °Call 911. °Call the  National Suicide Prevention Lifeline at 1-800-273-8255 or 988. This is open 24 hours a day. °Text the Crisis Text Line at 741741. °Summary °Grief is the result of a major change or an absence of someone or something that you count on. Grief is a normal reaction to loss. °The depth of grief and the period of recovery depend on the type of loss and your ability to adjust to the change and process your feelings. °Processing grief requires patience and a willingness to accept your feelings and talk about your loss with people who are supportive. °It is important to find resources that work for you and to realize that people experience grief differently. There is not one grieving process that works for everyone in the same way. °Be aware that when grief becomes extreme, it can lead to more severe issues like isolation, depression, anxiety, or suicidal thoughts. Talk with your health care provider if you have any of these issues. °This information is not intended to replace advice given to you by your health care provider. Make sure you discuss any questions you have with your health care provider. °Document Revised: 02/18/2021 Document Reviewed: 02/18/2021 °Elsevier Patient Education © 2022 Elsevier Inc. ° °

## 2021-07-31 NOTE — Progress Notes (Signed)
Appointment Outcome: Completed, Session #: 5 Start time: 8:17am   End time: 8:47am   Total Mins: 30 minute  AGREEMENTS SECTION    Overall goal(s): Increase physical activity Maintain healthy eating habits Staying positive and motivated                                               Agreement/Action Steps: Increasing physical activity Set alarm on phone to engage in physical activity between 10-10:30pm 3xs/week [employer] app for walking and yoga exercises at home 15 minutes of walking 15 minutes of Yoga Chair exercises (optional)   Maintaining healthy eating habits Follow diet restrictions from [facility] - 1500 calories/daily Maintain consumption of 84-100 oz of water/fluid per day Limit fast food consumption by cooking at home Monitor sodium intake by reading food labels (?1,500 mg of sodium/day) Practice portion control   Positivity/Motivation to engage in healthy eating and physical activity behaviors Utilize support system Incorporate positive affirmations and positive self-talk Deep breathing Take an emotional break when needed   Progress Notes:  Patient shared that she had two family members to pass since the last session, in addition to traveling for work. Patient stated that these factors threw her off schedule with some of her steps. Patient mentioned that had to make the best choices with food with being away from home for work and to attend a funeral.   Patient was not able to read food labels at those times but continued to practice portion control. Patient mentioned that she stops eating when she feels full. Patient stated that yesterday was her first-time cooking at home in a while. Patient shared that she had vegan meatballs with zucchini noodles.  Patient was able to maintain drinking 84-100 oz of water per day. Patient mentioned that she has also been drinking Pedialyte and Gatorade   Patient stated that she walked to work every day last week  instead of driving. Patient shared that she increased her steps during this time from 900 to 5,000-6,000 steps each day. Patient choose to walk during this time because she was not able to exercise between 10-10:30pm.   Patient has been incorporating some me time to process grief and to decompress. Patient has gone out with family, friends, and coworkers for support and to give her a chance to get her mind off of things that was causing her stress. Patient shared an incident when her coworkers were there to help her through a stressful moment.   Patient stated that during that time she practiced deep breathing and implemented positive self-talk to collect herself. Patient is also incorporating these steps when she is overwhelmed about caring for her mother's health as well as herself.   Patient stated that she also incorporates breaks throughout the day by watching tv, playing word games, listening to music, and sitting in quietness for peace. Patient stated that she engages in these behaviors because sometimes her mind is full and feel that she does not have the mental or emotional capacity to process information.   Indicators of Success and Accountability:  Patient stated that making better choices by walking to work to get more steps in, taking me time and taking breaks.  Readiness: Patient is in the action phase of improving healthy eating habits, increasing physical activity, and staying positive/motivated.  Strengths and Supports: Patient is being supported  by family, friends, and coworkers. Patient is being  Challenges and Barriers: Patient is still grieving the loss of her family members over the past 2.5 months, which may present a challenge to implementing some of her action steps over the next two weeks.   Coaching Outcomes: Patient will continue to implement action steps as outlined above over the next two weeks.  Patient was provided information on managing loss for her review.    Patient is considering increasing the number of therapy sessions that she currently attend to aid in the grieving process.   Attempted: Fulfilled - Patient was able to maintain consumption of 84-100 oz of water per day, practice portion control, utilize support system, incorporate positive affirmations/self-talk, practice deep breathing, and take breaks when needed. Partial - Patient was able to walk more than 15 minutes and 3xs/week during last week.  Not met - Patient was not able to adhere to her [facility] diet over the past two weeks because of fast food consumption on the go. Patient was not able to monitor her sodium intake in the past two weeks due to having to eat out.

## 2021-08-01 ENCOUNTER — Other Ambulatory Visit: Payer: Self-pay | Admitting: Nurse Practitioner

## 2021-08-01 ENCOUNTER — Encounter: Payer: Self-pay | Admitting: Nurse Practitioner

## 2021-08-01 DIAGNOSIS — I10 Essential (primary) hypertension: Secondary | ICD-10-CM

## 2021-08-05 NOTE — Telephone Encounter (Signed)
A user error has taken place: encounter opened in error, closed for administrative reasons.

## 2021-08-06 ENCOUNTER — Other Ambulatory Visit: Payer: Self-pay | Admitting: Surgery

## 2021-08-06 DIAGNOSIS — E210 Primary hyperparathyroidism: Secondary | ICD-10-CM

## 2021-08-08 ENCOUNTER — Other Ambulatory Visit: Payer: Self-pay | Admitting: Gastroenterology

## 2021-08-08 ENCOUNTER — Other Ambulatory Visit: Payer: Self-pay | Admitting: Nurse Practitioner

## 2021-08-08 DIAGNOSIS — E119 Type 2 diabetes mellitus without complications: Secondary | ICD-10-CM

## 2021-08-08 DIAGNOSIS — Z794 Long term (current) use of insulin: Secondary | ICD-10-CM

## 2021-08-08 DIAGNOSIS — R1084 Generalized abdominal pain: Secondary | ICD-10-CM

## 2021-08-11 DIAGNOSIS — I10 Essential (primary) hypertension: Secondary | ICD-10-CM | POA: Diagnosis not present

## 2021-08-12 ENCOUNTER — Encounter: Payer: Self-pay | Admitting: Nurse Practitioner

## 2021-08-12 ENCOUNTER — Ambulatory Visit (INDEPENDENT_AMBULATORY_CARE_PROVIDER_SITE_OTHER): Payer: BC Managed Care – PPO | Admitting: Nurse Practitioner

## 2021-08-12 ENCOUNTER — Telehealth (HOSPITAL_BASED_OUTPATIENT_CLINIC_OR_DEPARTMENT_OTHER): Payer: Self-pay | Admitting: Cardiovascular Disease

## 2021-08-12 ENCOUNTER — Other Ambulatory Visit: Payer: Self-pay

## 2021-08-12 VITALS — BP 120/78 | HR 91 | Temp 98.1°F | Ht 64.0 in | Wt 388.0 lb

## 2021-08-12 DIAGNOSIS — Z794 Long term (current) use of insulin: Secondary | ICD-10-CM

## 2021-08-12 DIAGNOSIS — E119 Type 2 diabetes mellitus without complications: Secondary | ICD-10-CM

## 2021-08-12 DIAGNOSIS — Z5181 Encounter for therapeutic drug level monitoring: Secondary | ICD-10-CM

## 2021-08-12 DIAGNOSIS — E1169 Type 2 diabetes mellitus with other specified complication: Secondary | ICD-10-CM

## 2021-08-12 DIAGNOSIS — G43809 Other migraine, not intractable, without status migrainosus: Secondary | ICD-10-CM

## 2021-08-12 DIAGNOSIS — I10 Essential (primary) hypertension: Secondary | ICD-10-CM | POA: Diagnosis not present

## 2021-08-12 DIAGNOSIS — Z6841 Body Mass Index (BMI) 40.0 and over, adult: Secondary | ICD-10-CM

## 2021-08-12 MED ORDER — OZEMPIC (2 MG/DOSE) 8 MG/3ML ~~LOC~~ SOPN: 2.0000 mg | PEN_INJECTOR | SUBCUTANEOUS | 1 refills | Status: DC

## 2021-08-12 NOTE — Telephone Encounter (Signed)
Patient called in and states the Endarbi that Dr. Oval Linsey prescribed is too expensive.  Is there something else she can take?

## 2021-08-12 NOTE — Patient Instructions (Signed)
Migraine Headache A migraine headache is an intense, throbbing pain on one side or both sides of the head. Migraine headaches may also cause other symptoms, such as nausea, vomiting, and sensitivity to light and noise. A migraine headache can last from 4 hours to 3 days. Talk with your doctor about what things may bring on (trigger) your migraine headaches. What are the causes? The exact cause of this condition is not known. However, a migraine may be caused when nerves in the brain become irritated and release chemicals that cause inflammation of blood vessels. This inflammation causes pain. This condition may be triggered or caused by: Drinking alcohol. Smoking. Taking medicines, such as: Medicine used to treat chest pain (nitroglycerin). Birth control pills. Estrogen. Certain blood pressure medicines. Eating or drinking products that contain nitrates, glutamate, aspartame, or tyramine. Aged cheeses, chocolate, or caffeine may also be triggers. Doing physical activity. Other things that may trigger a migraine headache include: Menstruation. Pregnancy. Hunger. Stress. Lack of sleep or too much sleep. Weather changes. Fatigue. What increases the risk? The following factors may make you more likely to experience migraine headaches: Being a certain age. This condition is more common in people who are 25-55 years old. Being female. Having a family history of migraine headaches. Being Caucasian. Having a mental health condition, such as depression or anxiety. Being obese. What are the signs or symptoms? The main symptom of this condition is pulsating or throbbing pain. This pain may: Happen in any area of the head, such as on one side or both sides. Interfere with daily activities. Get worse with physical activity. Get worse with exposure to bright lights or loud noises. Other symptoms may include: Nausea. Vomiting. Dizziness. General sensitivity to bright lights, loud noises, or  smells. Before you get a migraine headache, you may get warning signs (an aura). An aura may include: Seeing flashing lights or having blind spots. Seeing bright spots, halos, or zigzag lines. Having tunnel vision or blurred vision. Having numbness or a tingling feeling. Having trouble talking. Having muscle weakness. Some people have symptoms after a migraine headache (postdromal phase), such as: Feeling tired. Difficulty concentrating. How is this diagnosed? A migraine headache can be diagnosed based on: Your symptoms. A physical exam. Tests, such as: CT scan or an MRI of the head. These imaging tests can help rule out other causes of headaches. Taking fluid from the spine (lumbar puncture) and analyzing it (cerebrospinal fluid analysis, or CSF analysis). How is this treated? This condition may be treated with medicines that: Relieve pain. Relieve nausea. Prevent migraine headaches. Treatment for this condition may also include: Acupuncture. Lifestyle changes like avoiding foods that trigger migraine headaches. Biofeedback. Cognitive behavioral therapy. Follow these instructions at home: Medicines Take over-the-counter and prescription medicines only as told by your health care provider. Ask your health care provider if the medicine prescribed to you: Requires you to avoid driving or using heavy machinery. Can cause constipation. You may need to take these actions to prevent or treat constipation: Drink enough fluid to keep your urine pale yellow. Take over-the-counter or prescription medicines. Eat foods that are high in fiber, such as beans, whole grains, and fresh fruits and vegetables. Limit foods that are high in fat and processed sugars, such as fried or sweet foods. Lifestyle Do not drink alcohol. Do not use any products that contain nicotine or tobacco, such as cigarettes, e-cigarettes, and chewing tobacco. If you need help quitting, ask your health care  provider. Get at least 8   hours of sleep every night. Find ways to manage stress, such as meditation, deep breathing, or yoga. General instructions   Keep a journal to find out what may trigger your migraine headaches. For example, write down: What you eat and drink. How much sleep you get. Any change to your diet or medicines. If you have a migraine headache: Avoid things that make your symptoms worse, such as bright lights. It may help to lie down in a dark, quiet room. Do not drive or use heavy machinery. Ask your health care provider what activities are safe for you while you are experiencing symptoms. Keep all follow-up visits as told by your health care provider. This is important. Contact a health care provider if: You develop symptoms that are different or more severe than your usual migraine headache symptoms. You have more than 15 headache days in one month. Get help right away if: Your migraine headache becomes severe. Your migraine headache lasts longer than 72 hours. You have a fever. You have a stiff neck. You have vision loss. Your muscles feel weak or like you cannot control them. You start to lose your balance often. You have trouble walking. You faint. You have a seizure. Summary A migraine headache is an intense, throbbing pain on one side or both sides of the head. Migraines may also cause other symptoms, such as nausea, vomiting, and sensitivity to light and noise. This condition may be treated with medicines and lifestyle changes. You may also need to avoid certain things that trigger a migraine headache. Keep a journal to find out what may trigger your migraine headaches. Contact your health care provider if you have more than 15 headache days in a month or you develop symptoms that are different or more severe than your usual migraine headache symptoms. This information is not intended to replace advice given to you by your health care provider. Make sure you  discuss any questions you have with your health care provider. Document Revised: 10/22/2018 Document Reviewed: 08/12/2018 Elsevier Patient Education  2022 Elsevier Inc.  

## 2021-08-12 NOTE — Progress Notes (Signed)
I,Tianna Badgett,acting as a Education administrator for [employer], FNP.,have documented all relevant documentation on the behalf of [name], FNP,as directed by  [name], FNP while in the presence of [name], [place].  This visit occurred during the SARS-CoV-2 public health emergency.  Safety protocols were in place, including screening questions prior to the visit, additional usage of staff PPE, and extensive cleaning of exam room while observing appropriate contact time as indicated for disinfecting solutions.  Subjective:     Patient ID: Emily Phelps , female    DOB: [birth year] , 41 y.o.   MRN: [id number]   Chief Complaint  Patient presents with   Migraine    HPI  Patient presents today for a migraine and diabetes check. She is wanting to have an additional bathroom break - 30 minutes in addition to other 2 breaks. She is also in need of intermittent FMLA - for her migraines. She is having a migraine once a month will take sumitriptan. She has light sensitivity and vomiting.   She just seen Dr. Lynford Humphrey for her parathyroid (planning to do an ultrasound and thyroid). Goal to do one surgery She has had several deaths back to back, which she feels has added to her stress level.   Migraine  This is a chronic problem. The current episode started more than 1 year ago. The problem has been unchanged. The pain is located in the Bilateral (behind her eyes) region. The pain quality is not similar to prior headaches. The quality of the pain is described as aching. Associated symptoms include dizziness, photophobia and vomiting. Pertinent negatives include no abdominal pain or coughing. The symptoms are aggravated by bright light. She has tried triptans for the symptoms. Her past medical history is significant for hypertension.  Diabetes She presents for her follow-up diabetic visit. She has type 2 diabetes mellitus. Hypoglycemia symptoms include dizziness and headaches. There are no diabetic  associated symptoms. Pertinent negatives for diabetes include no chest pain. There are no hypoglycemic complications. There are no diabetic complications. Risk factors for coronary artery disease include obesity and sedentary lifestyle. Current diabetic treatment includes oral agent (dual therapy). She is compliant with treatment all of the time. Diabetic current diet: she has been doing a keto diet. When asked about meal planning, she reported none. She has not had a previous visit with a dietitian. (Blood sugar has been below 70 sometimes she will not have an appetite. She is no longer on insulin) An ACE inhibitor/angiotensin II receptor blocker is being taken. She does not see a podiatrist.Eye exam is not current.  Hypertension This is a chronic problem. The current episode started more than 1 year ago. The problem is unchanged. The problem is controlled. Associated symptoms include headaches. Pertinent negatives include no anxiety, chest pain or palpitations. Risk factors for coronary artery disease include obesity and diabetes mellitus. There is no history of chronic renal disease.  Headache  This is a chronic problem. The current episode started more than 1 year ago. The problem occurs intermittently (having at least 4 times a week). The pain is located in the Temporal (temples and behind her eyes) region. The quality of the pain is described as aching. Associated symptoms include dizziness, photophobia and vomiting. Pertinent negatives include no abdominal pain or coughing. She has tried nothing for the symptoms. Her past medical history is significant for hypertension.    Past Medical History:  Diagnosis Date   Allergy    Diabetes mellitus ([place])    Family  history of adverse reaction to anesthesia    mother had n/v after    Family history of hypertrophic cardiomyopathy 05/13/2021   GERD (gastroesophageal reflux disease)    Hyperlipidemia    Hypertension    Migraine    Obesity    Palpitations  05/13/2021   Rectal bleeding    Resistant hypertension 11/28/2014   Wears contact lenses      Family History  Problem Relation Age of Onset   Hypertension Mother    Colon polyps Mother    Diabetes Father    Hypertension Father    Prostate cancer Father    Hypertension Brother    Thyroid disease Maternal Aunt    Heart disease Maternal Uncle    Kidney failure Maternal Uncle    Hypertension Maternal Grandmother    Heart disease Maternal Grandmother    Hypertrophic cardiomyopathy Maternal Grandmother    Colon cancer Neg Hx    Esophageal cancer Neg Hx    Liver cancer Neg Hx    Stomach cancer Neg Hx    Rectal cancer Neg Hx      Current Outpatient Medications:    albuterol (VENTOLIN HFA) 108 (90 Base) MCG/ACT inhaler, Inhale 2 puffs into the lungs every 6 (six) hours as needed for wheezing or shortness of breath., Disp: 6.7 g, Rfl: 0   amLODipine (NORVASC) 5 MG tablet, Take 1 tablet (5 mg total) by mouth daily., Disp: 90 tablet, Rfl: 3   atenolol (TENORMIN) 50 MG tablet, TAKE 1 TABLET BY MOUTH EVERY DAY, Disp: 90 tablet, Rfl: 6   Azilsartan Medoxomil (EDARBI) 80 MG TABS, 1 tablet, Disp: , Rfl:    Continuous Blood Gluc Receiver ([place]) DEVI, Use to check blood sugars dx code e11.65, Disp: 3 each, Rfl: 3   Continuous Blood Gluc Sensor (DEXCOM G6 SENSOR) MISC, USE AS DIRECTED - CHANGE every 10 DAYS, Disp: 3 each, Rfl: 0   Continuous Blood Gluc Transmit (DEXCOM G6 TRANSMITTER) MISC, USE TO CHECK BLOOD SUGAR. CHANGE EVERY 90 DAYS, Disp: 1 each, Rfl: 3   Cyanocobalamin (VITAMIN B12) 1000 MCG TBCR, Take 1,000 mcg by mouth daily. , Disp: , Rfl:    dicyclomine (BENTYL) 10 MG capsule, TAKE 1 CAPSULE BY MOUTH 4 TIMES DAILY AS NEEDED FOR SPASMS, Disp: 30 capsule, Rfl: 2   FARXIGA 5 MG TABS tablet, TAKE 1 TABLET BY MOUTH EVERY DAY BEFORE BREAKFAST, Disp: 90 tablet, Rfl: 1   fexofenadine (ALLEGRA) 180 MG tablet, TAKE 1 TABLET BY MOUTH EVERY DAY, Disp: 30 tablet, Rfl: 1   ibuprofen  (ADVIL) 200 MG tablet, Take 200 mg by mouth every 6 (six) hours as needed for moderate pain., Disp: , Rfl:    Multiple Vitamin (MULTIVITAMIN WITH MINERALS) TABS tablet, Take 1 tablet by mouth daily., Disp: , Rfl:    omeprazole (PRILOSEC) 40 MG capsule, Take 1 capsule (40 mg total) by mouth 2 (two) times daily., Disp: 180 capsule, Rfl: 3   ondansetron (ZOFRAN ODT) 4 MG disintegrating tablet, Take 1 tablet (4 mg total) by mouth every 8 (eight) hours as needed for nausea or vomiting., Disp: 20 tablet, Rfl: 0   polyethylene glycol powder (MIRALAX) 17 GM/SCOOP powder, Please take 1 capful dissolved in 8 oz of water prn constipation, Disp: 238 g, Rfl: 1   Semaglutide, 2 MG/DOSE, (OZEMPIC, 2 MG/DOSE,) 8 MG/3ML SOPN, Inject 2 mg into the skin once a week., Disp: 9 mL, Rfl: 1   spironolactone (ALDACTONE) 25 MG tablet, TAKE 1 TABLET BY MOUTH EVERY DAY,  Disp: 30 tablet, Rfl: 0   SUMAtriptan (IMITREX) 50 MG tablet, TAKE 1 TABLET BY MOUTH EVERY 2 HOURS AS NEEDED FOR migraine, may repeat in 2 hours if headache persists or recurs, Disp: 10 tablet, Rfl: 1   topiramate (TOPAMAX) 50 MG tablet, TAKE 1 TABLET BY MOUTH 2 TIMES DAILY, Disp: 180 tablet, Rfl: 1   VITAMIN D PO, Take 1 tablet by mouth daily. , Disp: , Rfl:    zolpidem (AMBIEN) 10 MG tablet, Take 1 tablet (10 mg total) by mouth at bedtime as needed for sleep., Disp: 30 tablet, Rfl: 3   Allergies  Allergen Reactions   Tape Rash    Other reaction(s): Unknown     Review of Systems  Constitutional: Negative.   Eyes:  Positive for photophobia.  Respiratory: Negative.  Negative for cough.   Cardiovascular: Negative.  Negative for chest pain and palpitations.  Gastrointestinal:  Positive for vomiting. Negative for abdominal pain.  Neurological:  Positive for dizziness and headaches.    Today's Vitals   08/12/21 1449  BP: 120/78  Pulse: 91  Temp: 98.1 F (36.7 C)  TempSrc: Oral  Weight: (!) 388 lb (176 kg)  Height: 5\' 4"  (1.626 m)   Body mass index  is 66.6 kg/m.  Wt Readings from Last 3 Encounters:  08/12/21 (!) 388 lb (176 kg)  06/20/21 (!) 373 lb 6.4 oz (169.4 kg)  05/13/21 (!) 382 lb 11.2 oz (173.6 kg)    Objective:  Physical Exam Vitals reviewed.  Constitutional:      General: She is not in acute distress.    Appearance: Normal appearance. She is obese.  Cardiovascular:     Rate and Rhythm: Normal rate and regular rhythm.     Pulses: Normal pulses.     Heart sounds: Normal heart sounds. No murmur heard. Pulmonary:     Effort: Pulmonary effort is normal. No respiratory distress.     Breath sounds: Normal breath sounds. No wheezing.  Skin:    General: Skin is warm and dry.     Capillary Refill: Capillary refill takes less than 2 seconds.     Coloration: Skin is not jaundiced.  Neurological:     General: No focal deficit present.     Mental Status: She is alert and oriented to person, place, and time.     Cranial Nerves: No cranial nerve deficit.     Motor: No weakness.  Psychiatric:        Mood and Affect: Mood normal.        Behavior: Behavior normal.        Thought Content: Thought content normal.        Judgment: Judgment normal.        Assessment And Plan:     1. Other migraine without status migrainosus, not intractable Comments: Will get once a month, takes sumitriptan with relief.   2. Essential hypertension Comments: Blood pressure is normal. Continue current medications  3. Controlled type 2 diabetes mellitus without complication, with long-term current use of insulin ([place]) Comments: Much better controlled, will increase Ozempic to 2 mg weekly.  - Hemoglobin A1c - Semaglutide, 2 MG/DOSE, (OZEMPIC, 2 MG/DOSE,) 8 MG/3ML SOPN; Inject 2 mg into the skin once a week.  Dispense: 9 mL; Refill: 1  4. Morbid obesity with BMI of 60.0-69.9, adult [facility]) Comments: Continue going to [facility], weight increased 15 lbs since December, she has been under increased stress.  She is encouraged to strive for BMI less  than  30 to decrease cardiac risk. Advised to aim for at least 150 minutes of exercise per week.  She is to send in her accomodation form for her to have an extra bathroom break.    Patient was given opportunity to ask questions. Patient verbalized understanding of the plan and was able to repeat key elements of the plan. All questions were answered to their satisfaction.  [name], FNP   I, [name], FNP, have reviewed all documentation for this visit. The documentation on 08/12/21 for the exam, diagnosis, procedures, and orders are all accurate and complete.   IF YOU HAVE BEEN REFERRED TO A SPECIALIST, IT MAY TAKE 1-2 WEEKS TO SCHEDULE/PROCESS THE REFERRAL. IF YOU HAVE NOT HEARD FROM US/SPECIALIST IN TWO WEEKS, PLEASE GIVE [place] A CALL AT [phone] X [phone].   THE PATIENT IS ENCOURAGED TO PRACTICE SOCIAL DISTANCING DUE TO THE COVID-19 PANDEMIC.

## 2021-08-12 NOTE — Telephone Encounter (Signed)
Skeet Latch, MD to Meryl Crutch, RN  Me  Me    12:30 PM Recommend switching to valsartan 320mg .  BMP in a week.   TCR   Left message to call back

## 2021-08-13 LAB — HEMOGLOBIN A1C
Est. average glucose Bld gHb Est-mCnc: 105 mg/dL
Hgb A1c MFr Bld: 5.3 % (ref 4.8–5.6)

## 2021-08-13 MED ORDER — VALSARTAN 320 MG PO TABS: 320.0000 mg | ORAL_TABLET | Freq: Every day | ORAL | 3 refills | Status: DC

## 2021-08-13 NOTE — Telephone Encounter (Signed)
Advised patient, verbalized understanding  

## 2021-08-14 ENCOUNTER — Encounter: Payer: Self-pay | Admitting: Nurse Practitioner

## 2021-08-16 ENCOUNTER — Ambulatory Visit (INDEPENDENT_AMBULATORY_CARE_PROVIDER_SITE_OTHER): Payer: BC Managed Care – PPO

## 2021-08-16 ENCOUNTER — Other Ambulatory Visit: Payer: Self-pay

## 2021-08-16 DIAGNOSIS — Z0000 Encounter for general adult medical examination without abnormal findings: Secondary | ICD-10-CM

## 2021-08-16 NOTE — Progress Notes (Signed)
Appointment Outcome: Completed, Session #: 6 Start time: 8:16am   End time: 8:46am   Total Mins: 30 minutes  AGREEMENTS SECTION   Overall goal(s): Increase physical activity Maintain healthy eating habits Staying positive and motivated                                               Agreement/Action Steps: Increasing physical activity Set alarm on phone to engage in physical activity between 10-10:30pm 3xs/week [employer] app for walking and yoga exercises at home 15 minutes of walking 15 minutes of Yoga Chair exercises (optional)   Maintaining healthy eating habits Follow diet restrictions from [facility] MD - 1500 calories/daily Maintain consumption of 84-100 oz of water/fluid per day Limit fast food consumption by cooking at home Monitor sodium intake by reading food labels (?1,500 mg of sodium/day) Practice portion control   Positivity/Motivation to engage in healthy eating and physical activity behaviors Utilize support system Incorporate positive affirmations and positive self-talk Deep breathing Take an emotional break when needed   Progress Notes:  Patient has been exercising twice a week each week for about 15 minutes at a time. Patient shared that she has been walking more. Patient has started walking her trash to the dumpster instead of driving it.   Patient stated that she has been off her diet and have gained some weight. Patient stated that she expected that result because she has been stressed eating. Patient stated that she had rice once or twice. Patient stated that with the added stress and grieving, it has been hard to fight cravings.   Patient stated that she is used to eating healthy and thought about replacing what she was eating when stressed, but that is not what she wants at that time because she has deprived herself for so long. Patient shared that when she do eat sweets, she tends to overeat.   Patient has been able to maintain healthy  eating when she eats out. Patient continues to eat egg whites, grilled chicken, and [place] sausage as her options when she does eat fast food. Patient has been cooking more and limits eating out to Friday and Saturday. Patient mentioned that she meal prep on Sundays and cook two meals that will last her until Thursday.   Patient stated that the taste of water has become bland. Patient reported that she is drinking 50-60 oz of water per day. Patient is considering using low-calorie, no sugar drink packs to flavor water. Patient stated that she has read food labels and still buy the same foods, so the amount of sodium does not fluctuate much. Patient is not seasoning her foods with salt but with garlic and onions.   Patient is utilizing her support system. Patient shared that she does have boundaries with her family and when she is not in the mental space to talk, she will inform them, or respectfully end the conversation. Patient recently had to provide support to a family member, which was helpful for the patient to process her emotions and to implement positive self-talk.  Patient has practiced deep breathing and reminding herself to be calm. Patient shared an incident when she needed to deep breathe the most. Patient mentioned that she had to remove herself from a situation to regroup.   Patient stated that she has taken emotional breaks by spending time with her  friends eating out and dancing. Patient stated that she was able to relax and relieve stress that she experienced from the passing of a family member. 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 Patient shared that she was able to get rest during this time and work on implement more self-care.  Indicators of Success and Accountability:  Patient has resumed exercising and is practicing portion control, which are her indicators of success and accountability. Readiness: Patient is in the action phase of increasing  physical activity, maintain healthy eating habits, and staying positive/motivated.  Strengths and Supports: Patient is being supported by her family. Patient is persistent despite her challenges with grief.  Challenges and Barriers: Patient do not foresee any challenges to implementing her action steps over the next month.   Coaching Outcomes: Patient will consider adding low-calorie and sugar free drink packs to her water to increase water consumption.   Patient will continue to implement her action steps as outlined above.   Patient has been scheduled for her one-month follow up session.   Attempted: Fulfilled - Patient has been able to limit fast food because she meal preps during the week. Patient is monitoring her sodium intake by reading food labels and is practicing portion control. Patient is utilizing her support system, incorporating positive affirmations/self-talk, practicing deep breathing, and taking emotional breaks when needed.  Partial - Patient is exercising twice a week for 15 minutes each time. Patient has had a few incidents where she wasn't able to adhere to her diet. Patient is drinking 50-60 oz of water per day.

## 2021-08-20 ENCOUNTER — Other Ambulatory Visit: Payer: Self-pay

## 2021-08-20 ENCOUNTER — Ambulatory Visit: Payer: BC Managed Care – PPO | Admitting: Cardiology

## 2021-08-20 ENCOUNTER — Encounter: Payer: Self-pay | Admitting: Cardiology

## 2021-08-20 ENCOUNTER — Ambulatory Visit: Payer: BC Managed Care – PPO | Admitting: Pharmacist

## 2021-08-20 ENCOUNTER — Ambulatory Visit
Admission: RE | Admit: 2021-08-20 | Discharge: 2021-08-20 | Disposition: A | Discharge status: Home / Self Care | Payer: BC Managed Care – PPO | Source: Ambulatory Visit | Attending: Surgery | Admitting: Surgery

## 2021-08-20 VITALS — BP 127/81 | HR 94 | Ht 64.0 in | Wt 387.4 lb

## 2021-08-20 VITALS — BP 116/80 | HR 79 | Resp 17 | Ht 64.0 in

## 2021-08-20 DIAGNOSIS — I10 Essential (primary) hypertension: Secondary | ICD-10-CM | POA: Diagnosis not present

## 2021-08-20 DIAGNOSIS — G4733 Obstructive sleep apnea (adult) (pediatric): Secondary | ICD-10-CM

## 2021-08-20 DIAGNOSIS — E210 Primary hyperparathyroidism: Secondary | ICD-10-CM

## 2021-08-20 NOTE — Progress Notes (Signed)
Patient ID: Emily Phelps                 DOB: June 23, 1981                      MRN: [id number]     HPI: Emily Phelps is a 41 y.o. female referred by Dr. Oval Linsey to HTN clinic. PMH is significant for HTN, DM (A1c 5.3), obesity, migraines, and HLD (LDL 129).  Patient was seen by Dr Radford Pax this morning in preparation for a CPAP.  This is patient;s second visit to HTN clinic. At last visit, BP was 135/88 however patient had an a very stressful month with numerous deaths in family. Had to travel to multiple funerals in multiple states and reports her diet was poor while on the road.    Patient presents today for follow up.  Copay on her Edarbi increased and she was recently switched to valsartan. Has had a stressful January as well and more family members passed away.  Recently spent a week in [place] for a business trip and while she was away from home she ate out three meals a day. Ozempic was increased to 2.0mg  weekly. Continues to go to [employer].  Sees a therapist once monthly through [place].  At last visit recommended patient discuss with PCP regarding starting a statin. She reports she did discuss with PCP but is going to try lifestyle changes first.  Current HTN meds:  Valsartan 320mg  daily Sprinolactone 25mg  daily Amlodipine 5mg  daily Atenolol 50mg  daily  Sees therapist once a month through [facility]  Previously tried: [place] (d/c due to cost) BP goal: <130/80   Wt Readings from Last 3 Encounters:  08/20/21 (!) 387 lb 6.4 oz (175.7 kg)  08/12/21 (!) 388 lb (176 kg)  06/20/21 (!) 373 lb 6.4 oz (169.4 kg)   BP Readings from Last 3 Encounters:  08/20/21 127/81  08/12/21 120/78  07/18/21 135/88   Pulse Readings from Last 3 Encounters:  08/20/21 94  08/12/21 91  07/18/21 82    Renal function: CrCl cannot be calculated (Patient's most recent lab result is older than the maximum 21 days allowed.).  Past Medical History:  Diagnosis Date   Allergy     Diabetes mellitus ([place])    Family history of adverse reaction to anesthesia    mother had n/v after    Family history of hypertrophic cardiomyopathy 05/13/2021   GERD (gastroesophageal reflux disease)    Hyperlipidemia    Hypertension    Migraine    Obesity    Palpitations 05/13/2021   Rectal bleeding    Resistant hypertension 11/28/2014   Wears contact lenses     Current Outpatient Medications on File Prior to Visit  Medication Sig Dispense Refill   albuterol (VENTOLIN HFA) 108 (90 Base) MCG/ACT inhaler Inhale 2 puffs into the lungs every 6 (six) hours as needed for wheezing or shortness of breath. 6.7 g 0   amLODipine (NORVASC) 5 MG tablet Take 1 tablet (5 mg total) by mouth daily. 90 tablet 3   atenolol (TENORMIN) 50 MG tablet TAKE 1 TABLET BY MOUTH EVERY DAY 90 tablet 6   Continuous Blood Gluc Receiver (DEXCOM G6 RECEIVER) DEVI Use to check blood sugars dx code e11.65 3 each 3   Continuous Blood Gluc Sensor (DEXCOM G6 SENSOR) MISC USE AS DIRECTED - CHANGE every 10 DAYS 3 each 0   Continuous Blood Gluc Transmit (DEXCOM G6 TRANSMITTER) MISC USE TO CHECK BLOOD SUGAR.  CHANGE EVERY 90 DAYS 1 each 3   Cyanocobalamin (VITAMIN B12) 1000 MCG TBCR Take 1,000 mcg by mouth daily.      dicyclomine (BENTYL) 10 MG capsule TAKE 1 CAPSULE BY MOUTH 4 TIMES DAILY AS NEEDED FOR SPASMS 30 capsule 2   FARXIGA 5 MG TABS tablet TAKE 1 TABLET BY MOUTH EVERY DAY BEFORE BREAKFAST 90 tablet 1   fexofenadine (ALLEGRA) 180 MG tablet TAKE 1 TABLET BY MOUTH EVERY DAY 30 tablet 1   ibuprofen (ADVIL) 200 MG tablet Take 200 mg by mouth every 6 (six) hours as needed for moderate pain.     Multiple Vitamin (MULTIVITAMIN WITH MINERALS) TABS tablet Take 1 tablet by mouth daily.     omeprazole (PRILOSEC) 40 MG capsule Take 1 capsule (40 mg total) by mouth 2 (two) times daily. 180 capsule 3   ondansetron (ZOFRAN ODT) 4 MG disintegrating tablet Take 1 tablet (4 mg total) by mouth every 8 (eight) hours as needed for nausea or  vomiting. 20 tablet 0   polyethylene glycol powder (MIRALAX) 17 GM/SCOOP powder Please take 1 capful dissolved in 8 oz of water prn constipation 238 g 1   Semaglutide, 2 MG/DOSE, (OZEMPIC, 2 MG/DOSE,) 8 MG/3ML SOPN Inject 2 mg into the skin once a week. 9 mL 1   spironolactone (ALDACTONE) 25 MG tablet TAKE 1 TABLET BY MOUTH EVERY DAY 30 tablet 0   SUMAtriptan (IMITREX) 50 MG tablet TAKE 1 TABLET BY MOUTH EVERY 2 HOURS AS NEEDED FOR migraine, may repeat in 2 hours if headache persists or recurs 10 tablet 1   topiramate (TOPAMAX) 50 MG tablet TAKE 1 TABLET BY MOUTH 2 TIMES DAILY 180 tablet 1   valsartan (DIOVAN) 320 MG tablet Take 1 tablet (320 mg total) by mouth daily. 90 tablet 3   VITAMIN D PO Take 1 tablet by mouth daily.      zolpidem (AMBIEN) 10 MG tablet Take 1 tablet (10 mg total) by mouth at bedtime as needed for sleep. 30 tablet 3   No current facility-administered medications on file prior to visit.    Allergies  Allergen Reactions   Silicone Rash    Other reaction(s): Unknown   Tape Rash    Other reaction(s): Unknown     Assessment/Plan:  1. Hypertension -  Patient BP in room today which is at goal of <130/80.  Patient tolerating all medications well including valsartan. Previously had told patient that [facility] can provide Edarbi at a discounted price however patient had forgotten.    Needs to work on lifestyle changes but unfortunately has been limited by time and stress with multiple deaths in her family.  Advised to continue to follow up with [facility].     Patient has follow up with Dr Oval Linsey in 4 weeks and is scheduled for updated BMP in 1-2 weeks.  No medication changes needed at this time.  Continue: Valsartan 320mg  daily Sprinolactone 25mg  daily Amlodipine 5mg  daily Atenolol 50mg  daily Follow up with Dr Oval Linsey in 4 weeks  Karren Cobble, PharmD, [place], [address], [place], [place], [place], [address] Phone: [phone], Fax: [phone]
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
## 2021-08-20 NOTE — Patient Instructions (Signed)
Medication Instructions:  Your physician recommends that you continue on your current medications as directed. Please refer to the Current Medication list given to you today.  *If you need a refill on your cardiac medications before your next appointment, please call your pharmacy*  Follow-Up: At [facility], you and your health needs are our priority.  As part of our continuing mission to provide you with exceptional heart care, we have created designated Provider Care Teams.  These Care Teams include your primary Cardiologist (physician) and Advanced Practice Providers (APPs -  Physician Assistants and Nurse Practitioners) who all work together to provide you with the care you need, when you need it.  Follow up with Dr. Radford Pax 6 months after receiving your CPAP device  Other Instructions Dr. Radford Pax has ordered you a ResMed Auto CPAP. Gae Bon will be in touch with you in regards to ordering the machine and following up with Dr. Radford Pax.

## 2021-08-20 NOTE — Patient Instructions (Addendum)
It was good seeing you again!  We would like to keep your blood pressure less than 130/80  Continue your: Valsartan 320mg  daily Sprinolactone 25mg  daily Amlodipine 5mg  daily Atenolol 50mg  daily  Continue to work on healthy eating and diet changes.    Remember to have your lab work drawn next week  Please call with any concerns!  Karren Cobble, PharmD, BCACP, [place], [place], [place], [place], [address] Phone: [phone], Fax: [phone]

## 2021-08-21 ENCOUNTER — Telehealth: Payer: Self-pay | Admitting: *Deleted

## 2021-08-21 DIAGNOSIS — G4733 Obstructive sleep apnea (adult) (pediatric): Secondary | ICD-10-CM

## 2021-08-21 NOTE — Telephone Encounter (Signed)
-----   Message from [place], [place] sent at 08/20/2021  9:28 AM EST ----- Per Dr. Radford Pax: Please order auto CPAP from 4 to 15 cm H2O with heated humidity and mask of choice -She will see me back 6 weeks after she gets her device Thanks!

## 2021-08-21 NOTE — Telephone Encounter (Signed)
Order placed to [facility] via community message. 

## 2021-08-22 ENCOUNTER — Encounter: Payer: Self-pay | Admitting: Nurse Practitioner

## 2021-08-28 ENCOUNTER — Encounter: Payer: Self-pay | Admitting: *Deleted

## 2021-08-28 NOTE — Progress Notes (Unsigned)
Noted increase in trending of blood pressures in [facility]. Secure chat sent to provider. See comment below. Dr Oval Linsey responded that she would look into this case.   Dr Oval Linsey, it's Erasmo Downer from [facility]. I am following Emily Phelps in the HTN clinic. I have noticed her BPs over the last week and a half are trending upwards, ranging from 149-157/99-108. I have been chatting with her, and it seems that her stress level is better, she is walking, and eating better. Her BP yesterday was 149/103, and she reported being a little dizzy yesterday, but feels better today. Her next appt with you is 3/2. I don't know if you had wanted to do anything, but I wanted to give you a heads up that more often than not, she is recently in the red zones for her BPs. Thanks!

## 2021-08-28 NOTE — Progress Notes (Signed)
Error in creating encounter.

## 2021-08-30 ENCOUNTER — Telehealth: Payer: Self-pay | Admitting: Cardiovascular Disease

## 2021-08-31 ENCOUNTER — Other Ambulatory Visit: Payer: Self-pay | Admitting: Nurse Practitioner

## 2021-08-31 DIAGNOSIS — I10 Essential (primary) hypertension: Secondary | ICD-10-CM

## 2021-09-03 ENCOUNTER — Telehealth (HOSPITAL_BASED_OUTPATIENT_CLINIC_OR_DEPARTMENT_OTHER): Payer: Self-pay | Admitting: *Deleted

## 2021-09-03 MED ORDER — AMLODIPINE BESYLATE 10 MG PO TABS: 10.0000 mg | ORAL_TABLET | Freq: Every day | ORAL | 3 refills | Status: DC

## 2021-09-03 NOTE — Telephone Encounter (Signed)
-----   Message from Skeet Latch, MD sent at 08/30/2021  1:04 PM EST ----- I was notified that most of her BP readings are above goal in [place].  Can you please call this patient and ask her to increase amlodipine to 10mg ?  She will need a new prescription sent to the pharmacy.  Thank you!

## 2021-09-03 NOTE — Telephone Encounter (Signed)
Advised patient, verbalized understanding  

## 2021-09-04 ENCOUNTER — Telehealth: Payer: Self-pay | Admitting: *Deleted

## 2021-09-04 NOTE — Telephone Encounter (Signed)
High alert BP this morning of 172/127. Reached out at 0958 with no response and again at 1415 with no response. Attempted to call her  multiple times at [phone] with message stating unable to connect phone call. Following are chat messages left for her.     "Hi Emily Phelps - were you able to pick up and start your prescription of Amlodipine 10mg ?" Emily Phelps, N.  09/04/2021 09:58.   "Hi again - I have been trying to call you just to check in. Wanted to make sure you were feeling ok with that high blood pressure and to see if you started the increased dose of amlodipine." Emily Phelps, N.  09/04/2021 14:15.

## 2021-09-05 ENCOUNTER — Encounter (HOSPITAL_BASED_OUTPATIENT_CLINIC_OR_DEPARTMENT_OTHER): Payer: Self-pay

## 2021-09-05 LAB — BASIC METABOLIC PANEL
BUN/Creatinine Ratio: 14 (ref 9–23)
BUN: 11 mg/dL (ref 6–20)
CO2: 19 mmol/L — ABNORMAL LOW (ref 20–29)
Calcium: 10.3 mg/dL — ABNORMAL HIGH (ref 8.7–10.2)
Chloride: 106 mmol/L (ref 96–106)
Creatinine, Ser: 0.79 mg/dL (ref 0.57–1.00)
Glucose: 86 mg/dL (ref 70–99)
Potassium: 4.4 mmol/L (ref 3.5–5.2)
Sodium: 139 mmol/L (ref 134–144)
eGFR: 98 mL/min/{1.73_m2} (ref >59)

## 2021-09-05 NOTE — Telephone Encounter (Addendum)
Left detailed message needs BMET, ok per [facility]  Mychart message sent as well

## 2021-09-08 ENCOUNTER — Ambulatory Visit: Payer: Self-pay | Admitting: Surgery

## 2021-09-08 NOTE — Progress Notes (Signed)
[facility]N confirms the presence of an enlarged right inferior parathyroid gland consistent with adenoma.  This corresponds to the signal seen on nuclear scan in that location.  Will plan to proceed with minimally invasive right inferior parathyroidectomy as we discussed in the office.  Will enter orders and send to our schedulers to contact patient.  tmg  Armandina Gemma, [facility] [place] [facility] Office: [phone]

## 2021-09-10 DIAGNOSIS — I10 Essential (primary) hypertension: Secondary | ICD-10-CM | POA: Diagnosis not present

## 2021-09-12 ENCOUNTER — Encounter (HOSPITAL_BASED_OUTPATIENT_CLINIC_OR_DEPARTMENT_OTHER): Payer: Self-pay | Admitting: Cardiovascular Disease

## 2021-09-12 ENCOUNTER — Other Ambulatory Visit: Payer: Self-pay

## 2021-09-12 ENCOUNTER — Ambulatory Visit (HOSPITAL_BASED_OUTPATIENT_CLINIC_OR_DEPARTMENT_OTHER): Payer: BC Managed Care – PPO | Admitting: Cardiovascular Disease

## 2021-09-12 ENCOUNTER — Other Ambulatory Visit: Payer: Self-pay | Admitting: Gastroenterology

## 2021-09-12 DIAGNOSIS — Z794 Long term (current) use of insulin: Secondary | ICD-10-CM

## 2021-09-12 DIAGNOSIS — Z006 Encounter for examination for normal comparison and control in clinical research program: Secondary | ICD-10-CM

## 2021-09-12 DIAGNOSIS — E119 Type 2 diabetes mellitus without complications: Secondary | ICD-10-CM

## 2021-09-12 DIAGNOSIS — G4733 Obstructive sleep apnea (adult) (pediatric): Secondary | ICD-10-CM | POA: Diagnosis not present

## 2021-09-12 DIAGNOSIS — Z6841 Body Mass Index (BMI) 40.0 and over, adult: Secondary | ICD-10-CM

## 2021-09-12 DIAGNOSIS — I10 Essential (primary) hypertension: Secondary | ICD-10-CM | POA: Diagnosis not present

## 2021-09-12 DIAGNOSIS — K219 Gastro-esophageal reflux disease without esophagitis: Secondary | ICD-10-CM

## 2021-09-12 MED ORDER — ATENOLOL 100 MG PO TABS: 100.0000 mg | ORAL_TABLET | Freq: Every day | ORAL | 3 refills | Status: DC
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
## 2021-09-12 NOTE — Assessment & Plan Note (Addendum)
Controlled.  Hemoglobin A1c 5.3%. Continue Farxiga and Ozempic. ?

## 2021-09-12 NOTE — Assessment & Plan Note (Signed)
Diagnosed with OSA.  Awaiting CPAP. ?

## 2021-09-12 NOTE — Assessment & Plan Note (Signed)
Blood pressure remains uncontrolled.  However her blood pressure in the office is much better than the readings we have been getting in the remote patient monitoring system.  I do not think that her RPM cuff is reading accurately.  We were able to use an extra-large upper arm cuff and her blood pressures were consistently in the 120s to 130s over 80s.  She is working to get a CPAP machine.  She is also scheduled to have her parathyroid removed 10/2021.  Once this is removed we can start her on HCTZ.  For now we will increase atenolol to 100 mg daily.  Continue amlodipine, spironolactone, and valsartan.  CT a of the abdomen was negative for adenomas or renal artery stenosis.  She continues to use NSAIDs very rarely. ?

## 2021-09-12 NOTE — Patient Instructions (Addendum)
Medication Instructions:  ?INCREASE YOUR ATENOLOL TO 100 MG DAILY  ? ?Labwork: ?NONE ? ?Testing/Procedures: ?NONE ? ?Follow-Up: ?11/21/2021 11:00 AM WITH PHARM D AT [facility] OFFICE  ? ?Any Other Special Instructions Will Be Listed Below (If Applicable). ?MONITOR AND LOG YOUR BLOOD PRESSURE TWICE A DAY. BRING YOUR READINGS AND MACHINE TO FOLLOW UP  ? ? ? ?

## 2021-09-12 NOTE — Research (Signed)
I saw pt today after Dr. Blenda Mounts follow up visit. Pt is in Dr. Blenda Mounts Virtual care HTN study. Pt filled out research survey. Pt was enrolled in group 2.  ?

## 2021-09-12 NOTE — Assessment & Plan Note (Signed)
Continue with diet and work to increase exercise.  Continue Ozempic. ?

## 2021-09-13 ENCOUNTER — Ambulatory Visit: Payer: BC Managed Care – PPO

## 2021-09-13 ENCOUNTER — Telehealth: Payer: Self-pay

## 2021-09-13 DIAGNOSIS — Z0000 Encounter for general adult medical examination without abnormal findings: Secondary | ICD-10-CM

## 2021-09-13 NOTE — Telephone Encounter (Signed)
Called patient to reschedule missed session this morning at 8:15am. Patient agreed to rescheduling session. Patient stated that she is off on 3/10 and could do the session that morning. Patient has been rescheduled for 3/10 at 8:15am. Patient will be called at that time. ? ? ?Emily Phelps, [name] ?[facility] ?Care Guide, Health Coach ?[address] ?[place] [address] ?Telephone: [phone] ?Email: [name] ? ?

## 2021-09-17 ENCOUNTER — Other Ambulatory Visit: Payer: Self-pay | Admitting: Gastroenterology

## 2021-09-17 ENCOUNTER — Ambulatory Visit: Payer: BC Managed Care – PPO | Admitting: Nurse Practitioner

## 2021-09-17 DIAGNOSIS — K219 Gastro-esophageal reflux disease without esophagitis: Secondary | ICD-10-CM

## 2021-09-20 ENCOUNTER — Ambulatory Visit (INDEPENDENT_AMBULATORY_CARE_PROVIDER_SITE_OTHER): Payer: BC Managed Care – PPO

## 2021-09-20 ENCOUNTER — Other Ambulatory Visit: Payer: Self-pay

## 2021-09-20 DIAGNOSIS — Z0000 Encounter for general adult medical examination without abnormal findings: Secondary | ICD-10-CM

## 2021-09-20 NOTE — Progress Notes (Signed)
Appointment Outcome: Completed, Session #: [id number] ?Start time: 8:21am   End time: 9:00am   Total Mins: 39 minutes ? ?AGREEMENTS SECTION ? ? ?Overall goal(s): ?Increase physical activity ?Maintain healthy eating habits ?Staying positive and motivated                                             ?  ?Agreement/Action Steps: ?Increasing physical activity ?Set alarm on phone to engage in physical activity between 10-10:30pm 3xs/week ?Utilize YouTube and [employer]for walking and yoga exercises at home ?15 minutes of walking ?15 minutes of Yoga ?Chair exercises (optional) ?  ?Maintaining healthy eating habits ?[facility] calories/daily ?Maintain consumption of 84-100 oz of water/fluid per day ?Limit fast food consumption by cooking at home ?Monitor sodium intake by reading food labels (?1,500 mg of sodium/day) ?Practice portion control ?  ?Positivity/Motivation to engage in healthy eating and physical activity behaviors ?Utilize support system ?Incorporate positive affirmations and positive self-talk ?Deep breathing ?Take an emotional break when needed   ? ? ?Progress Notes:  ?Patient has been able to cook more at home to limit her fast-food consumption. Patient shared that she must eat a lot and frequently according to the diet and has had trouble eating 1500 calories daily. Patient stated that after eating breakfast, she has to eat 2 hours later, and she is really full. Patient is also consuming 84 oz of water 5 days a week, which also contributes to her not being able to eat as much. Patient has been drinking mineral water or lemon lime water.  ? ?Patient shared that she ate baked fish and broccoli for dinner last night and had a fasting sugar of 47. Patient is not sure why this is happening when she is eating. Patient reported that she is taking Ozempic. Patient mentioned that she has carbohydrate restrictions and still is not allowed to eat carbs such as rice. Patient shared  that she is getting frustrated because she is not losing the weight.  ? ?Patient continues to practice portion control according to the serving sizes from her diet restrictions. Patient is eating the same foods and not reading food labels every time to monitor sodium content because she is not eating new foods to check the sodium.  ? ?Patient stated that she has been exercising once a week by walking to the dumpster 15 mins each way. Patient shared that she has been walking and moving around more by climbing stairs, walking around in her home, dancing occasionally, and walking while working to get more steps in. Patient stated that she works from 9am-6pm and then go to her second job until 11:30pm-12am. [facility] discussed walking approximately 7,500 steps daily as her physical activity. Patient stated that she has a watch to track her steps but forgets to wear it, so she uses her iPhone instead to track steps.  ? ?Patient is using her support system but have had to establish healthy boundaries to help with reducing stress. Patient is incorporating positive affirmations and positive self-talk. Patient ensures that she takes breaks when necessary. Patient stated that during her lunch break, she meditates and take deep breathes. Patient stated that her stressed has somewhat been reduced. Patient stated that she has learned when to remove herself from different situations to avoid stress.  ? ? ?  Indicators of Success and Accountability:  Patient has been able to maintain her healthy eating habits.  ?Readiness: Patient is in the action phase of increasing physical activity, maintaining healthy eating habits, and staying positive and motivated.  ?Strengths and Supports: Patient is being supported by her family. Patient has been determined to implement her steps to decrease her stress and not be in the same place.  ?Challenges and Barriers: Patient's work schedule may be a challenge to  exercising 3xs/week over the next three months.  ? ?Coaching Outcomes: ?Patient will focus on reaching 7,500 steps per day and moving more instead of having a structured exercise routine and time. Patient will track steps using her iPhone.  ? ?Discussed with patient the option of speaking with her provider at [facility] regarding her diet and how to effectively implement it for successful weight loss. Offered patient an alternative to see the [facility] and [place] for a diet plan to lose weight if she is interested in another option to work on her eating habits.  ? ?Patient will implement the following action steps over the next three months as outlined below. ? ? ?Overall goal(s): ?Increase physical activity ?Maintain healthy eating habits ?Stress management                                         ?  ?Agreement/Action Steps: ?Increasing physical activity ?Walk 7,500 steps per day ?Track steps using iPhone ?  ?Maintaining healthy eating habits ?Follow diet restrictions from [facility] MD - 1500 calories/daily ?Speak to provider about progress with diet ?Maintain consumption of 84-100 oz of water/fluid per day ?Limit fast food consumption by cooking at home ?Monitor sodium intake by reading food labels (?1,500 mg of sodium/day) ?Practice portion control ?  ?Stress management ?Utilize support system ?Maintain healthy boundaries ?Incorporate positive affirmations and positive self-talk ?Deep breathing ?Take an emotional break when needed   ? ? ?Attempted: ?Fulfilled - Patient has been able to limit her fast-food consumption by cooking at home, practice portion control, utilize her support system, incorporate positive self-talk/affirmation, practice deep breathing, and take emotional breaks when needed.  ?Partial - Patient has been exercising once a week over the past month. Patient is drinking 84 oz of water 5 days a week. Patient has been able to follow her dietary restrictions but not able to reach 1500  calories/daily.  ?Not met - Patient did not read food labels, or utilize YouTube or [employer]. ?

## 2021-09-26 ENCOUNTER — Telehealth: Payer: Self-pay | Admitting: Pharmacist Clinician (PhC)/ Clinical Pharmacy Specialist

## 2021-09-26 ENCOUNTER — Telehealth: Payer: Self-pay | Admitting: *Deleted

## 2021-09-26 DIAGNOSIS — I10 Essential (primary) hypertension: Secondary | ICD-10-CM

## 2021-09-26 NOTE — Telephone Encounter (Signed)
Secure chat with Dr. Oval Linsey "Hi Dr. Oval Linsey. Pt increased Atenolol to '100mg'$  qday on 3/2. Today is 2wk mark and no real decrease in BP has been seen. Messaged pt and she is asymptomatic and feels fine. Thanks." Noted in secure chat that message was received and passed off to K. [place] for follow up.  ?

## 2021-09-26 NOTE — Telephone Encounter (Signed)
LMOM asking patient to return call.  Home BP readings in [place] are not coming down since last appointment.  Atenolol was increased from 50 to 100 mg daily.  Would like to increase spironolactone from 25 to 50 mg daily at this time.   She will need to repeat BMET in 10-14 days after increasing dose.  Has PharmD follow up scheduled for May 11.   ?

## 2021-09-27 ENCOUNTER — Other Ambulatory Visit: Payer: Self-pay | Admitting: Pharmacist

## 2021-09-27 ENCOUNTER — Other Ambulatory Visit: Payer: Self-pay | Admitting: Nurse Practitioner

## 2021-09-27 DIAGNOSIS — I10 Essential (primary) hypertension: Secondary | ICD-10-CM

## 2021-09-27 MED ORDER — SPIRONOLACTONE 50 MG PO TABS: 50.0000 mg | ORAL_TABLET | Freq: Every day | ORAL | 1 refills | Status: DC

## 2021-09-27 NOTE — Telephone Encounter (Signed)
Pt called in and stated that they were instructed to call kristin back. And left [place] a message  ?

## 2021-09-27 NOTE — Telephone Encounter (Signed)
error 

## 2021-09-27 NOTE — Telephone Encounter (Signed)
Increased spironolactone to '50mg'$  per Dr Brion Aliment.  Order for BMP placed ?

## 2021-09-27 NOTE — Telephone Encounter (Signed)
Lmom the pt to call back  ?

## 2021-09-27 NOTE — Addendum Note (Signed)
Addended by: Rollen Sox on: 09/27/2021 03:52 PM ? ? Modules accepted: Orders ? ?

## 2021-10-04 DIAGNOSIS — I10 Essential (primary) hypertension: Secondary | ICD-10-CM | POA: Diagnosis not present

## 2021-10-10 LAB — BASIC METABOLIC PANEL
BUN/Creatinine Ratio: 14 (ref 9–23)
BUN: 15 mg/dL (ref 6–20)
CO2: 21 mmol/L (ref 20–29)
Calcium: 11.2 mg/dL — ABNORMAL HIGH (ref 8.7–10.2)
Chloride: 106 mmol/L (ref 96–106)
Creatinine, Ser: 1.04 mg/dL — ABNORMAL HIGH (ref 0.57–1.00)
Glucose: 69 mg/dL — ABNORMAL LOW (ref 70–99)
Potassium: 4.5 mmol/L (ref 3.5–5.2)
Sodium: 141 mmol/L (ref 134–144)
eGFR: 71 mL/min/{1.73_m2} (ref >59)

## 2021-10-17 NOTE — Progress Notes (Signed)
COVID Vaccine Completed: yes x3 ?Date COVID Vaccine completed: 03/30/20, 04/20/20 ?Has received booster: 05/13/21 ?COVID vaccine manufacturer: Pfizer   ? ?Date of COVID positive in last 90 days: ? ?PCP - [name], FNP ?Cardiologist -  ? ?Chest x-ray -  ?EKG - 05/13/21 Epic ?Stress Test -  ?ECHO - 06/20/21 Epic ?Cardiac Cath -  ?Pacemaker/ICD device last checked: ?Spinal Cord Stimulator: ? ?Bowel Prep -  ? ?Sleep Study -  ?CPAP -  ? ?Fasting Blood Sugar -  ?Checks Blood Sugar _____ times a day ? ?Blood Thinner Instructions: ?Aspirin Instructions: ?Last Dose: ? ?Activity level:  Can go up a flight of stairs and perform activities of daily living without stopping and without symptoms of chest pain or shortness of breath. ?  Able to exercise without symptoms ? ?Unable to go up a flight of stairs without symptoms of  ?   ? ?Anesthesia review: HTN, OSA, DM2, palpitations, 1st degree AV block ? ?Patient denies shortness of breath, fever, cough and chest pain at PAT appointment ? ? ?Patient verbalized understanding of instructions that were given to them at the PAT appointment. Patient was also instructed that they will need to review over the PAT instructions again at home before surgery.  ?

## 2021-10-17 NOTE — Patient Instructions (Addendum)
DUE TO COVID-19 ONLY ONE VISITOR  (aged 39 and older)  IS ALLOWED TO COME WITH YOU AND STAY IN THE WAITING ROOM ONLY DURING PRE OP AND PROCEDURE.   ?**NO VISITORS ARE ALLOWED IN THE SHORT STAY AREA OR RECOVERY ROOM!!** ? ? Your procedure is scheduled on: 11/06/21 ? ? Report to [facility] Main Entrance ? ?  Report to admitting at 5:15 AM ? ? Call this number if you have problems the morning of surgery [phone] ? ? Do not eat food :After Midnight. ? ? After Midnight you may have the following liquids until 4:30 AM DAY OF SURGERY ? ?Water ?Black Coffee (sugar ok, NO MILK/CREAM OR CREAMERS)  ?Tea (sugar ok, NO MILK/CREAM OR CREAMERS) regular and decaf                             ?Plain Jell-O (NO RED)                                           ?Fruit ices (not with fruit pulp, NO RED)                                     ?Popsicles (NO RED)                                                                  ?Juice: apple, WHITE grape, WHITE cranberry ?Sports drinks like Gatorade (NO RED) ?Clear broth(vegetable,chicken,beef) ? ?FOLLOW ANY ADDITIONAL PRE OP INSTRUCTIONS YOU RECEIVED FROM YOUR SURGEON'S OFFICE!!! ?  ?  ?Oral Hygiene is also important to reduce your risk of infection.                                    ?Remember - BRUSH YOUR TEETH THE MORNING OF SURGERY WITH YOUR REGULAR TOOTHPASTE ? ? Do NOT smoke after Midnight ? ? Take these medicines the morning of surgery with A SIP OF WATER: Inhalers, Amlodipine, Atenolol, Allegra, Omeprazole, Zofran, Topiramate.  ? ?DO NOT TAKE ANY ORAL DIABETIC MEDICATIONS DAY OF YOUR SURGERY ? ?How to Manage Your Diabetes ?Before and After Surgery ? ?Why is it important to control my blood sugar before and after surgery? ?Improving blood sugar levels before and after surgery helps healing and can limit problems. ?A way of improving blood sugar control is eating a healthy diet by: ? Eating less sugar and carbohydrates ? Increasing activity/exercise ? Talking with your doctor  about reaching your blood sugar goals ?High blood sugars (greater than 180 mg/dL) can raise your risk of infections and slow your recovery, so you will need to focus on controlling your diabetes during the weeks before surgery. ?Make sure that the doctor who takes care of your diabetes knows about your planned surgery including the date and location. ? ?How do I manage my blood sugar before surgery? ?Check your blood sugar at least 4 times a day, starting 2 days before surgery, to make sure that  the level is not too high or low. ?Check your blood sugar the morning of your surgery when you wake up and every 2 hours until you get to the Short Stay unit. ?If your blood sugar is less than 70 mg/dL, you will need to treat for low blood sugar: ?Do not take insulin. ?Treat a low blood sugar (less than 70 mg/dL) with ? cup of clear juice (cranberry or apple), 4 glucose tablets, OR glucose gel. ?Recheck blood sugar in 15 minutes after treatment (to make sure it is greater than 70 mg/dL). If your blood sugar is not greater than 70 mg/dL on recheck, call [phone] for further instructions. ?Report your blood sugar to the short stay nurse when you get to Short Stay. ? ?If you are admitted to the hospital after surgery: ?Your blood sugar will be checked by the staff and you will probably be given insulin after surgery (instead of oral diabetes medicines) to make sure you have good blood sugar levels. ?The goal for blood sugar control after surgery is 80-180 mg/dL. ? ? ?WHAT DO I DO ABOUT MY DIABETES MEDICATION? ? ?Do not take oral diabetes medicines (pills) the morning of surgery. ? ?THE DAY BEFORE SURGERY:  Do not take [place]. ? ?THE MORNING OF SURGERY:  Do not take [place] or Semaglutide. ? ?Reviewed and Endorsed by [facility] Patient Education Committee, August 2015  ? ?Bring CPAP mask and tubing day of surgery. ?                  ?           You may not have any metal on your body including hair pins, jewelry, and body  piercing ? ?           Do not wear make-up, lotions, powders, perfumes, or deodorant ? ?Do not wear nail polish including gel and S&S, artificial/acrylic nails, or any other type of covering on natural nails including finger and toenails. If you have artificial nails, gel coating, etc. that needs to be removed by a nail salon please have this removed prior to surgery or surgery may need to be canceled/ delayed if the surgeon/ anesthesia feels like they are unable to be safely monitored.  ? ?Do not shave  48 hours prior to surgery.  ? ? Do not bring valuables to the hospital. [place]. ? ? Contacts, dentures or bridgework may not be worn into surgery. ?  ?Patients discharged on the day of surgery will not be allowed to drive home.  Someone NEEDS to stay with you for the first 24 hours after anesthesia. ? ? ?Please read over the following fact sheets you were given: IF [place]  ? ?   [place] - Preparing for Surgery ?Before surgery, you can play an important role.  Because skin is not sterile, your skin needs to be as free of germs as possible.  You can reduce the number of germs on your skin by washing with CHG (chlorahexidine gluconate) soap before surgery.  CHG is an antiseptic cleaner which kills germs and bonds with the skin to continue killing germs even after washing. ?Please DO NOT use if you have an allergy to CHG or antibacterial soaps.  If your skin becomes reddened/irritated stop using the CHG and inform your nurse when you arrive at Short Stay. ?Do not shave (including legs and underarms) for at  least 48 hours prior to the first CHG shower.  You may shave your face/neck. ? ?Please follow these instructions carefully: ? 1.  Shower with CHG Soap the night before surgery and the  morning of surgery. ? 2.  If you choose to wash your hair, wash your hair first as usual with your normal   shampoo. ? 3.  After you shampoo, rinse your hair and body thoroughly to remove the shampoo.                            ? 4.  Use CHG as you would any other liquid soap.  You can apply chg directly to the skin and wash.  Gently with a scrungie or clean washcloth. ? 5.  Apply the CHG Soap to your body ONLY FROM THE NECK DOWN.   Do   not use on face/ open      ?                     Wound or open sores. Avoid contact with eyes, ears mouth and   genitals (private parts).  ?                     Production manager,  Genitals (private parts) with your normal soap. ?            6.  Wash thoroughly, paying special attention to the area where your    surgery  will be performed. ? 7.  Thoroughly rinse your body with warm water from the neck down. ? 8.  DO NOT shower/wash with your normal soap after using and rinsing off the CHG Soap. ?               9.  Pat yourself dry with a clean towel. ?           10.  Wear clean pajamas. ?           11.  Place clean sheets on your bed the night of your first shower and do not  sleep with pets. ?Day of Surgery : ?Do not apply any lotions/deodorants the morning of surgery.  Please wear clean clothes to the hospital/surgery center. ? ?FAILURE TO FOLLOW THESE INSTRUCTIONS MAY RESULT IN THE CANCELLATION OF YOUR SURGERY ? ?PATIENT SIGNATURE_________________________________ ? ?NURSE SIGNATURE__________________________________ ? ?________________________________________________________________________  ?

## 2021-10-21 ENCOUNTER — Encounter (HOSPITAL_COMMUNITY): Payer: Self-pay

## 2021-10-21 ENCOUNTER — Encounter (HOSPITAL_COMMUNITY)
Admission: RE | Admit: 2021-10-21 | Discharge: 2021-10-21 | Disposition: A | Discharge status: Home / Self Care | Payer: BC Managed Care – PPO | Source: Ambulatory Visit | Attending: Surgery | Admitting: Surgery

## 2021-10-21 ENCOUNTER — Other Ambulatory Visit: Payer: Self-pay

## 2021-10-21 ENCOUNTER — Encounter (HOSPITAL_COMMUNITY): Admission: RE | Admit: 2021-10-21 | Payer: BC Managed Care – PPO | Source: Ambulatory Visit

## 2021-10-21 VITALS — BP 144/87 | HR 83 | Temp 97.7°F | Resp 20 | Ht 64.0 in | Wt 384.0 lb

## 2021-10-21 DIAGNOSIS — E210 Primary hyperparathyroidism: Secondary | ICD-10-CM | POA: Diagnosis not present

## 2021-10-21 DIAGNOSIS — G4733 Obstructive sleep apnea (adult) (pediatric): Secondary | ICD-10-CM | POA: Diagnosis not present

## 2021-10-21 DIAGNOSIS — I10 Essential (primary) hypertension: Secondary | ICD-10-CM | POA: Diagnosis not present

## 2021-10-21 DIAGNOSIS — Z01812 Encounter for preprocedural laboratory examination: Secondary | ICD-10-CM | POA: Diagnosis present

## 2021-10-21 DIAGNOSIS — E119 Type 2 diabetes mellitus without complications: Secondary | ICD-10-CM | POA: Diagnosis not present

## 2021-10-21 DIAGNOSIS — Z01818 Encounter for other preprocedural examination: Secondary | ICD-10-CM

## 2021-10-21 HISTORY — DX: Pneumonia, unspecified organism: J18.9

## 2021-10-21 HISTORY — DX: Sleep apnea, unspecified: G47.30

## 2021-10-21 LAB — GLUCOSE, CAPILLARY: Glucose-Capillary: 133 mg/dL — ABNORMAL HIGH (ref 70–99)

## 2021-10-21 LAB — CBC
HCT: 38.9 % (ref 36.0–46.0)
Hemoglobin: 12.9 g/dL (ref 12.0–15.0)
MCH: 26.1 pg (ref 26.0–34.0)
MCHC: 33.2 g/dL (ref 30.0–36.0)
MCV: 78.7 fL — ABNORMAL LOW (ref 80.0–100.0)
Platelets: 500 10*3/uL — ABNORMAL HIGH (ref 150–400)
RBC: 4.94 MIL/uL (ref 3.87–5.11)
RDW: 17.8 % — ABNORMAL HIGH (ref 11.5–15.5)
WBC: 11.6 10*3/uL — ABNORMAL HIGH (ref 4.0–10.5)
nRBC: 0 % (ref 0.0–0.2)

## 2021-10-21 LAB — HEMOGLOBIN A1C
Hgb A1c MFr Bld: 5 % (ref 4.8–5.6)
Mean Plasma Glucose: 96.8 mg/dL

## 2021-10-21 NOTE — Progress Notes (Signed)
[facility] bed requested with portable equipment for surgery on 11-06-21. ?

## 2021-10-21 NOTE — Progress Notes (Addendum)
COVID Vaccine Completed: yes x3 ?Date COVID Vaccine completed: 03/30/20, 04/20/20 ?Has received booster: 05/13/21 ?COVID vaccine manufacturer: Pfizer   ?  ?Date of COVID positive in last 90 days:  No ?  ?PCP - [name], FNP ?Cardiologist - Dr. Skeet Latch ?  ?Chest x-ray - N/A ?EKG - 05/13/21 Epic ?Stress Test - N/A ?ECHO - 06/20/21 Epic ?Cardiac Cath - N/A ?Pacemaker/ICD device last checked: ?Spinal Cord Stimulator: ?  ?Bowel Prep - N/A ?  ?Sleep Study - Yes+sleep apnea ?CPAP - Yes  ?  ?Fasting Blood Sugar - 70 to 170 ?Checks Blood Sugar - Dexcom L abdomen ?  ?Blood Thinner Instructions: N/A ?Aspirin Instructions: ?Last Dose: ?  ?Activity level:   Can go up a flight of stairs and perform activities of daily living without stopping and without symptoms of chest pain or shortness of breath. ?                       ?Anesthesia review: HTN, OSA on CPAP, DM2, palpitations, 1st degree AV block, obesity ?  ?Patient denies shortness of breath, fever, cough and chest pain at PAT appointment ?  ?  ?Patient verbalized understanding of instructions that were given to them at the PAT appointment. Patient was also instructed that they will need to review over the PAT instructions again at home before surgery.  ?

## 2021-10-22 NOTE — Anesthesia Preprocedure Evaluation (Addendum)
Anesthesia Evaluation  ?Patient identified by MRN, date of birth, ID band ?Patient awake ? ? ? ?Reviewed: ?Allergy & Precautions, NPO status , Patient's Chart, lab work & pertinent test results ? ?Airway ?Mallampati: II ? ?TM Distance: >3 FB ?Neck ROM: Full ? ? ? Dental ?no notable dental hx. ? ?  ?Pulmonary ?sleep apnea and Continuous Positive Airway Pressure Ventilation ,  ?  ?Pulmonary exam normal ? ? ? ? ? ? ? Cardiovascular ?hypertension, Pt. on medications and Pt. on home beta blockers ?Normal cardiovascular exam ? ? ?  ?Neuro/Psych ? Headaches, PSYCHIATRIC DISORDERS Anxiety   ? GI/Hepatic ?Neg liver ROS, GERD  Medicated and Controlled,  ?Endo/Other  ?diabetes, Oral Hypoglycemic AgentsMorbid obesity ? Renal/GU ?negative Renal ROS  ? ?  ?Musculoskeletal ?negative musculoskeletal ROS ?(+)  ? Abdominal ?  ?Peds ? Hematology ?negative hematology ROS ?(+)   ?Anesthesia Other Findings ?PRIMARY HYPERPARATHYROIDISM ? Reproductive/Obstetrics ?hcg negative ? ?  ? ? ? ? ? ? ? ? ? ? ? ? ? ?  ?  ? ? ? ? ? ?Anesthesia Physical ?Anesthesia Plan ? ?ASA: 4 ? ?Anesthesia Plan: General  ? ?Post-op Pain Management:   ? ?Induction: Intravenous and Rapid sequence ? ?PONV Risk Score and Plan: 4 or greater and Ondansetron, Dexamethasone, Midazolam, Treatment may vary due to age or medical condition and Scopolamine patch - Pre-op ? ?Airway Management Planned: Oral ETT ? ?Additional Equipment:  ? ?Intra-op Plan:  ? ?Post-operative Plan: Extubation in OR ? ?Informed Consent: I have reviewed the patients History and Physical, chart, labs and discussed the procedure including the risks, benefits and alternatives for the proposed anesthesia with the patient or authorized representative who has indicated his/her understanding and acceptance.  ? ? ? ?Dental advisory given ? ?Plan Discussed with: CRNA ? ?Anesthesia Plan Comments: (APP note by Durel Salts, FNP )  ? ? ? ?Anesthesia Quick Evaluation ? ?
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
## 2021-10-24 ENCOUNTER — Other Ambulatory Visit: Payer: Self-pay | Admitting: Nurse Practitioner

## 2021-10-24 DIAGNOSIS — E119 Type 2 diabetes mellitus without complications: Secondary | ICD-10-CM

## 2021-11-06 ENCOUNTER — Ambulatory Visit (HOSPITAL_COMMUNITY): Payer: BC Managed Care – PPO | Admitting: Emergency Medicine

## 2021-11-06 ENCOUNTER — Other Ambulatory Visit: Payer: Self-pay

## 2021-11-06 ENCOUNTER — Encounter (HOSPITAL_COMMUNITY): Payer: Self-pay | Admitting: Surgery

## 2021-11-06 ENCOUNTER — Ambulatory Visit (HOSPITAL_COMMUNITY)
Admission: RE | Admit: 2021-11-06 | Discharge: 2021-11-06 | Disposition: A | Discharge status: Home / Self Care | Payer: BC Managed Care – PPO | Source: Ambulatory Visit | Attending: Surgery | Admitting: Surgery

## 2021-11-06 ENCOUNTER — Ambulatory Visit (HOSPITAL_COMMUNITY): Payer: BC Managed Care – PPO | Admitting: Certified Registered Nurse Anesthetist

## 2021-11-06 ENCOUNTER — Encounter (HOSPITAL_COMMUNITY)
Admission: RE | Disposition: A | Discharge status: Home / Self Care | Payer: Self-pay | Source: Ambulatory Visit | Attending: Surgery

## 2021-11-06 DIAGNOSIS — D351 Benign neoplasm of parathyroid gland: Secondary | ICD-10-CM | POA: Diagnosis not present

## 2021-11-06 DIAGNOSIS — E210 Primary hyperparathyroidism: Secondary | ICD-10-CM | POA: Insufficient documentation

## 2021-11-06 DIAGNOSIS — Z01818 Encounter for other preprocedural examination: Secondary | ICD-10-CM

## 2021-11-06 HISTORY — PX: PARATHYROIDECTOMY: SHX19

## 2021-11-06 LAB — GLUCOSE, CAPILLARY
Glucose-Capillary: 116 mg/dL — ABNORMAL HIGH (ref 70–99)
Glucose-Capillary: 134 mg/dL — ABNORMAL HIGH (ref 70–99)

## 2021-11-06 LAB — PREGNANCY, URINE: Preg Test, Ur: NEGATIVE

## 2021-11-06 SURGERY — PARATHYROIDECTOMY
Anesthesia: General | Laterality: Right

## 2021-11-06 MED ORDER — FENTANYL CITRATE (PF) 250 MCG/5ML IJ SOLN
INTRAMUSCULAR | Status: DC | PRN
  Administered 2021-11-06 (×3): 50 ug via INTRAVENOUS
  Administered 2021-11-06: 100 ug via INTRAVENOUS

## 2021-11-06 MED ORDER — TRAMADOL HCL 50 MG PO TABS
50.0000 mg | ORAL_TABLET | Freq: Four times a day (QID) | ORAL | 0 refills | Status: AC | PRN
Start: 2021-11-06 — End: ?

## 2021-11-06 MED ORDER — BUPIVACAINE HCL 0.25 % IJ SOLN
INTRAMUSCULAR | Status: DC | PRN
  Administered 2021-11-06: 14 mL

## 2021-11-06 MED ORDER — PROPOFOL 10 MG/ML IV BOLUS
INTRAVENOUS | Status: DC | PRN
Start: 2021-11-06 — End: 2021-11-06
  Administered 2021-11-06: 200 mg via INTRAVENOUS

## 2021-11-06 MED ORDER — OXYCODONE HCL 5 MG PO TABS: 5.0000 mg | ORAL_TABLET | Freq: Once | ORAL | Status: AC | PRN

## 2021-11-06 MED ORDER — SUCCINYLCHOLINE CHLORIDE 200 MG/10ML IV SOSY
PREFILLED_SYRINGE | INTRAVENOUS | Status: DC | PRN
  Administered 2021-11-06: 160 mg via INTRAVENOUS

## 2021-11-06 MED ORDER — LACTATED RINGERS IV SOLN: INTRAVENOUS | Status: DC

## 2021-11-06 MED ORDER — PROPOFOL 10 MG/ML IV BOLUS
INTRAVENOUS | Status: AC
  Filled 2021-11-06: qty 20

## 2021-11-06 MED ORDER — 0.9 % SODIUM CHLORIDE (POUR BTL) OPTIME
TOPICAL | Status: DC | PRN
  Administered 2021-11-06: 1000 mL

## 2021-11-06 MED ORDER — OXYCODONE HCL 5 MG/5ML PO SOLN: 5.0000 mg | Freq: Once | ORAL | Status: AC | PRN

## 2021-11-06 MED ORDER — SCOPOLAMINE 1 MG/3DAYS TD PT72
MEDICATED_PATCH | TRANSDERMAL | Status: DC | PRN
  Administered 2021-11-06: 1 via TRANSDERMAL

## 2021-11-06 MED ORDER — OXYCODONE HCL 5 MG PO TABS
ORAL_TABLET | ORAL | Status: AC
  Administered 2021-11-06: 5 mg via ORAL
  Filled 2021-11-06: qty 1

## 2021-11-06 MED ORDER — BUPIVACAINE HCL 0.25 % IJ SOLN
INTRAMUSCULAR | Status: AC
  Filled 2021-11-06: qty 1

## 2021-11-06 MED ORDER — ONDANSETRON HCL 4 MG/2ML IJ SOLN
INTRAMUSCULAR | Status: DC | PRN
  Administered 2021-11-06: 4 mg via INTRAVENOUS

## 2021-11-06 MED ORDER — FENTANYL CITRATE PF 50 MCG/ML IJ SOSY
PREFILLED_SYRINGE | INTRAMUSCULAR | Status: AC
  Administered 2021-11-06: 50 ug via INTRAVENOUS
  Filled 2021-11-06: qty 3

## 2021-11-06 MED ORDER — CHLORHEXIDINE GLUCONATE 0.12 % MT SOLN
15.0000 mL | Freq: Once | OROMUCOSAL | Status: AC
  Administered 2021-11-06: 15 mL via OROMUCOSAL

## 2021-11-06 MED ORDER — ROCURONIUM BROMIDE 10 MG/ML (PF) SYRINGE
PREFILLED_SYRINGE | INTRAVENOUS | Status: DC | PRN
Start: 2021-11-06 — End: 2021-11-06
  Administered 2021-11-06: 10 mg via INTRAVENOUS
  Administered 2021-11-06: 40 mg via INTRAVENOUS

## 2021-11-06 MED ORDER — PHENYLEPHRINE 80 MCG/ML (10ML) SYRINGE FOR IV PUSH (FOR BLOOD PRESSURE SUPPORT)
PREFILLED_SYRINGE | INTRAVENOUS | Status: AC
  Filled 2021-11-06: qty 10

## 2021-11-06 MED ORDER — HEMOSTATIC AGENTS (NO CHARGE) OPTIME
TOPICAL | Status: DC | PRN
Start: 2021-11-06 — End: 2021-11-06
  Administered 2021-11-06: 1 via TOPICAL

## 2021-11-06 MED ORDER — MIDAZOLAM HCL 2 MG/2ML IJ SOLN
INTRAMUSCULAR | Status: DC | PRN
  Administered 2021-11-06: 2 mg via INTRAVENOUS

## 2021-11-06 MED ORDER — CEFAZOLIN IN SODIUM CHLORIDE 3-0.9 GM/100ML-% IV SOLN
3.0000 g | INTRAVENOUS | Status: AC
  Administered 2021-11-06: 3 g via INTRAVENOUS
  Filled 2021-11-06: qty 100

## 2021-11-06 MED ORDER — PHENYLEPHRINE 80 MCG/ML (10ML) SYRINGE FOR IV PUSH (FOR BLOOD PRESSURE SUPPORT)
PREFILLED_SYRINGE | INTRAVENOUS | Status: DC | PRN
  Administered 2021-11-06: 240 ug via INTRAVENOUS

## 2021-11-06 MED ORDER — SUCCINYLCHOLINE CHLORIDE 200 MG/10ML IV SOSY
PREFILLED_SYRINGE | INTRAVENOUS | Status: AC
  Filled 2021-11-06: qty 10

## 2021-11-06 MED ORDER — ONDANSETRON HCL 4 MG/2ML IJ SOLN
INTRAMUSCULAR | Status: AC
  Filled 2021-11-06: qty 2

## 2021-11-06 MED ORDER — CHLORHEXIDINE GLUCONATE CLOTH 2 % EX PADS
6.0000 | MEDICATED_PAD | Freq: Once | CUTANEOUS | Status: DC
Start: 2021-11-06 — End: 2021-11-06

## 2021-11-06 MED ORDER — ROCURONIUM BROMIDE 10 MG/ML (PF) SYRINGE
PREFILLED_SYRINGE | INTRAVENOUS | Status: AC
  Filled 2021-11-06: qty 10

## 2021-11-06 MED ORDER — SUGAMMADEX SODIUM 200 MG/2ML IV SOLN
INTRAVENOUS | Status: DC | PRN
Start: 2021-11-06 — End: 2021-11-06
  Administered 2021-11-06: 400 mg via INTRAVENOUS

## 2021-11-06 MED ORDER — LIDOCAINE HCL (PF) 2 % IJ SOLN
INTRAMUSCULAR | Status: AC
  Filled 2021-11-06: qty 5

## 2021-11-06 MED ORDER — DEXAMETHASONE SODIUM PHOSPHATE 10 MG/ML IJ SOLN
INTRAMUSCULAR | Status: AC
  Filled 2021-11-06: qty 1

## 2021-11-06 MED ORDER — MIDAZOLAM HCL 2 MG/2ML IJ SOLN
INTRAMUSCULAR | Status: AC
  Filled 2021-11-06: qty 2

## 2021-11-06 MED ORDER — KETOROLAC TROMETHAMINE 30 MG/ML IJ SOLN
30.0000 mg | Freq: Once | INTRAMUSCULAR | Status: AC | PRN
  Administered 2021-11-06: 30 mg via INTRAVENOUS

## 2021-11-06 MED ORDER — DEXAMETHASONE SODIUM PHOSPHATE 10 MG/ML IJ SOLN
INTRAMUSCULAR | Status: DC | PRN
  Administered 2021-11-06: 5 mg via INTRAVENOUS

## 2021-11-06 MED ORDER — CHLORHEXIDINE GLUCONATE CLOTH 2 % EX PADS: 6.0000 | MEDICATED_PAD | Freq: Once | CUTANEOUS | Status: DC

## 2021-11-06 MED ORDER — FENTANYL CITRATE PF 50 MCG/ML IJ SOSY
25.0000 ug | PREFILLED_SYRINGE | INTRAMUSCULAR | Status: DC | PRN
  Administered 2021-11-06: 50 ug via INTRAVENOUS

## 2021-11-06 MED ORDER — BUPIVACAINE HCL (PF) 0.25 % IJ SOLN
INTRAMUSCULAR | Status: AC
  Filled 2021-11-06: qty 30

## 2021-11-06 MED ORDER — AMISULPRIDE (ANTIEMETIC) 5 MG/2ML IV SOLN
10.0000 mg | Freq: Once | INTRAVENOUS | Status: AC | PRN
  Administered 2021-11-06: 10 mg via INTRAVENOUS

## 2021-11-06 MED ORDER — ACETAMINOPHEN 500 MG PO TABS
1000.0000 mg | ORAL_TABLET | Freq: Once | ORAL | Status: AC
  Administered 2021-11-06: 1000 mg via ORAL
  Filled 2021-11-06: qty 2

## 2021-11-06 MED ORDER — AMISULPRIDE (ANTIEMETIC) 5 MG/2ML IV SOLN
INTRAVENOUS | Status: AC
  Filled 2021-11-06: qty 4

## 2021-11-06 MED ORDER — ORAL CARE MOUTH RINSE: 15.0000 mL | Freq: Once | OROMUCOSAL | Status: AC

## 2021-11-06 MED ORDER — LIDOCAINE HCL (CARDIAC) PF 100 MG/5ML IV SOSY
PREFILLED_SYRINGE | INTRAVENOUS | Status: DC | PRN
  Administered 2021-11-06: 100 mg via INTRAVENOUS

## 2021-11-06 MED ORDER — KETOROLAC TROMETHAMINE 30 MG/ML IJ SOLN
INTRAMUSCULAR | Status: AC
  Filled 2021-11-06: qty 1

## 2021-11-06 MED ORDER — FENTANYL CITRATE (PF) 250 MCG/5ML IJ SOLN
INTRAMUSCULAR | Status: AC
  Filled 2021-11-06: qty 5

## 2021-11-06 MED ORDER — SCOPOLAMINE 1 MG/3DAYS TD PT72
MEDICATED_PATCH | TRANSDERMAL | Status: AC
  Filled 2021-11-06: qty 1

## 2021-11-06 SURGICAL SUPPLY — 36 items
ATTRACTOMAT 16X20 MAGNETIC DRP (DRAPES) ×2 IMPLANT
BAG COUNTER SPONGE SURGICOUNT (BAG) ×2 IMPLANT
BLADE SURG 15 STRL LF DISP TIS (BLADE) ×1 IMPLANT
BLADE SURG 15 STRL SS (BLADE) ×2
CHLORAPREP W/TINT 26 (MISCELLANEOUS) ×2 IMPLANT
CLIP TI MEDIUM 6 (CLIP) ×4 IMPLANT
CLIP TI WIDE RED SMALL 6 (CLIP) ×4 IMPLANT
COVER SURGICAL LIGHT HANDLE (MISCELLANEOUS) ×2 IMPLANT
DERMABOND ADVANCED (GAUZE/BANDAGES/DRESSINGS) ×1
DERMABOND ADVANCED .7 DNX12 (GAUZE/BANDAGES/DRESSINGS) ×1 IMPLANT
DRAPE LAPAROTOMY T 98X78 PEDS (DRAPES) ×2 IMPLANT
DRAPE UTILITY XL STRL (DRAPES) ×2 IMPLANT
ELECT BLADE TIP CTD 4 INCH (ELECTRODE) ×1 IMPLANT
ELECT REM PT RETURN 15FT ADLT (MISCELLANEOUS) ×2 IMPLANT
GAUZE 4X4 16PLY ~~LOC~~+RFID DBL (SPONGE) ×2 IMPLANT
GLOVE SURG ORTHO 8.0 STRL STRW (GLOVE) ×2 IMPLANT
GLOVE SURG SYN 7.5  E (GLOVE) ×6
GLOVE SURG SYN 7.5 E (GLOVE) ×3 IMPLANT
GLOVE SURG SYN 7.5 PF PI (GLOVE) ×3 IMPLANT
GOWN STRL REUS W/ TWL XL LVL3 (GOWN DISPOSABLE) ×3 IMPLANT
GOWN STRL REUS W/TWL XL LVL3 (GOWN DISPOSABLE) ×6
HEMOSTAT SURGICEL 2X4 FIBR (HEMOSTASIS) ×2 IMPLANT
ILLUMINATOR WAVEGUIDE N/F (MISCELLANEOUS) IMPLANT
KIT BASIN OR (CUSTOM PROCEDURE TRAY) ×2 IMPLANT
KIT TURNOVER KIT A (KITS) IMPLANT
NDL HYPO 25X1 1.5 SAFETY (NEEDLE) ×1 IMPLANT
NEEDLE HYPO 25X1 1.5 SAFETY (NEEDLE) ×2 IMPLANT
PACK BASIC VI WITH GOWN DISP (CUSTOM PROCEDURE TRAY) ×2 IMPLANT
PENCIL SMOKE EVACUATOR (MISCELLANEOUS) ×2 IMPLANT
SUT MNCRL AB 4-0 PS2 18 (SUTURE) ×2 IMPLANT
SUT VIC AB 3-0 SH 18 (SUTURE) ×2 IMPLANT
SYR BULB IRRIG 60ML STRL (SYRINGE) ×2 IMPLANT
SYR CONTROL 10ML LL (SYRINGE) ×2 IMPLANT
TOWEL OR 17X26 10 PK STRL BLUE (TOWEL DISPOSABLE) ×2 IMPLANT
TOWEL OR NON WOVEN STRL DISP B (DISPOSABLE) ×2 IMPLANT
TUBING CONNECTING 10 (TUBING) ×2 IMPLANT

## 2021-11-06 NOTE — Anesthesia Procedure Notes (Signed)
Procedure Name: Intubation ?Date/Time: 11/06/2021 7:34 AM ?Performed by: Raenette Rover, CRNA ?Pre-anesthesia Checklist: Patient identified, Emergency Drugs available, Suction available and Patient being monitored ?Patient Re-evaluated:Patient Re-evaluated prior to induction ?Oxygen Delivery Method: Circle system utilized ?Preoxygenation: Pre-oxygenation with 100% oxygen ?Induction Type: IV induction ?Ventilation: Mask ventilation without difficulty ?Laryngoscope Size: Mac and 3 ?Grade View: Grade I ?Tube type: Oral ?Tube size: 7.5 mm ?Number of attempts: 1 ?Airway Equipment and Method: Stylet ?Placement Confirmation: ETT inserted through vocal cords under direct vision, positive ETCO2 and breath sounds checked- equal and bilateral ?Secured at: 22 cm ?Tube secured with: Tape ?Dental Injury: Teeth and Oropharynx as per pre-operative assessment  ? ? ? ? ?

## 2021-11-06 NOTE — Op Note (Signed)
OPERATIVE REPORT - PARATHYROIDECTOMY ? ?Preoperative diagnosis: Primary hyperparathyroidism ? ?Postop diagnosis: Same ? ?Procedure: Right inferior minimally invasive parathyroidectomy ? ?Surgeon:  Armandina Gemma, MD ? ?Anesthesia: General endotracheal ? ?Estimated blood loss: Minimal ? ?Preparation: ChloraPrep ? ?Indications: Patient is referred by Ennis Forts, FNP, for surgical evaluation and management of suspected primary hyperparathyroidism. Patient had been noted on routine laboratory studies to have an elevated serum calcium level ranging from 10.7 to as high as 11.9. Subsequent laboratory studies showed an elevated PTH level of 79.  Both the nuclear med sestamibi scan and the USN exam indicate a right inferior adenoma measuring about 2.3 cm in maximum dimension.  Patient now comes to surgery for parathyroidectomy. ? ?Procedure: The patient was prepared in the pre-operative holding area. The patient was brought to the operating room and placed in a supine position on the operating room table. Following administration of general anesthesia, the patient was positioned and then prepped and draped in the usual strict aseptic fashion. After ascertaining that an adequate level of anesthesia been achieved, a neck incision was made with a #15 blade. Dissection was carried through subcutaneous tissues and platysma. Hemostasis was obtained with the electrocautery. Skin flaps were developed circumferentially and a Weitlander retractor was placed for exposure. ? ?Strap muscles were incised in the midline. Strap muscles were reflected laterally exposing the thyroid lobe. With gentle blunt dissection the thyroid lobe was mobilized.  Dissection was carried posteriorly and an enlarged parathyroid gland was identified. It was gently mobilized. Vascular structures were divided between small ligaclips. Care was taken to avoid the recurrent laryngeal nerve which was immediately adjacent to the adenoma posteriorly. The parathyroid  gland was completely excised. It was submitted to pathology where frozen section confirmed hypercellular parathyroid tissue consistent with adenoma. ? ?Neck was irrigated with warm saline and good hemostasis was noted. Fibrillar was placed in the operative field. Strap muscles were approximated in the midline with interrupted 3-0 Vicryl sutures. Platysma was closed with interrupted 3-0 Vicryl sutures. Marcaine was infiltrated circumferentially. Skin was closed with a running 4-0 Monocryl subcuticular suture. Wound was washed and dried and Dermabond was applied. Patient was awakened from anesthesia and brought to the recovery room. The patient tolerated the procedure well. ? ? ?Armandina Gemma, MD ?[facility] ?Office: [phone]  ?

## 2021-11-06 NOTE — Transfer of Care (Signed)
Immediate Anesthesia Transfer of Care Note ? ?Patient: Emily Phelps ? ?Procedure(s) Performed: RIGHT INFERIOR PARATHYROIDECTOMY (Right) ? ?Patient Location: PACU ? ?Anesthesia Type:General ? ?Level of Consciousness: drowsy and patient cooperative ? ?Airway & Oxygen Therapy: Patient Spontanous Breathing and Patient connected to face mask oxygen ? ?Post-op Assessment: Report given to RN and Post -op Vital signs reviewed and stable ? ?Post vital signs: Reviewed and stable ? ?Last Vitals:  ?Vitals Value Taken Time  ?BP 150/100 11/06/21 0900  ?Temp    ?Pulse 79 11/06/21 0902  ?Resp 13 11/06/21 0902  ?SpO2 97 % 11/06/21 0902  ?Vitals shown include unvalidated device data. ? ?Last Pain:  ?Vitals:  ? 11/06/21 0623  ?TempSrc: Oral  ?PainSc:   ?   ? ?Patients Stated Pain Goal: 3 (11/06/21 0612) ? ?Complications: No notable events documented. ?

## 2021-11-06 NOTE — Interval H&P Note (Signed)
History and Physical Interval Note: ? ?11/06/2021 ?6:56 AM ? ?Emily Phelps  has presented today for surgery, with the diagnosis of PRIMARY HYPERPARATHYROIDISM.  The various methods of treatment have been discussed with the patient and family. After consideration of risks, benefits and other options for treatment, the patient has consented to  ? ? Procedure(s): ?RIGHT INFERIOR PARATHYROIDECTOMY (Right) as a surgical intervention.   ? ?The patient's history has been reviewed, patient examined, no change in status, stable for surgery.  I have reviewed the patient's chart and labs.  Questions were answered to the patient's satisfaction.   ? ?Armandina Gemma, MD ?[facility] ?A [facility] practice ?Office: [phone] ? ? ?Armandina Gemma ? ? ?

## 2021-11-06 NOTE — Discharge Instructions (Addendum)
[facility] - Dr. Todd Gerkin  THYROID & PARATHYROID SURGERY:  POST-OP INSTRUCTIONS  Always review the instruction sheet provided by the hospital nurse at discharge.  A prescription for pain medication may be sent to your pharmacy at the time of discharge.  Take your pain medication as prescribed.  If narcotic pain medicine is not needed, then you may take acetaminophen (Tylenol) or ibuprofen (Advil) as needed for pain or soreness.  Take your normal home medications as prescribed unless otherwise directed.  If you need a refill on your pain medication, please contact the office during regular business hours.  Prescriptions will not be processed by the office after 5:00PM or on weekends.  Start with a light diet upon arrival home, such as soup and crackers or toast.  Be sure to drink plenty of fluids.  Resume your normal diet the day after surgery.  Most patients will experience some swelling and bruising on the chest and neck area.  Ice packs will help for the first 48 hours after arriving home.  Swelling and bruising will take several days to resolve.   It is common to experience some constipation after surgery.  Increasing fluid intake and taking a stool softener (Colace) will usually help to prevent this problem.  A mild laxative (Milk of Magnesia or Miralax) should be taken according to package directions if there has been no bowel movement after 48 hours.  Dermabond glue covers your incision. This seals the wound and you may shower at any time. The Dermabond will remain in place for about a week.  You may gradually remove the glue when it loosens around the edges.  If you need to loosen the Dermabond for removal, apply a layer of Vaseline to the wound for 15 minutes and then remove with a Kleenex. Your sutures are under the skin and will not show - they will dissolve on their own.  You may resume light daily activities beginning the day after discharge (such as self-care,  walking, climbing stairs), gradually increasing activities as tolerated. You may have sexual intercourse when it is comfortable. Refrain from any heavy lifting or straining until approved by your doctor. You may drive when you no longer are taking prescription pain medication, you can comfortably wear a seatbelt, and you can safely maneuver your car and apply the brakes.  You will see your doctor in the office for a follow-up appointment approximately three weeks after your surgery.  Make sure that you call for this appointment within a day or two after you arrive home to insure a convenient appointment time. Please have any requested laboratory tests performed a few days prior to your office visit so that the results will be available at your follow up appointment.  WHEN TO CALL THE [facility] OFFICE: -- Fever greater than 101.5 -- Inability to urinate -- Nausea and/or vomiting - persistent -- Extreme swelling or bruising -- Continued bleeding from incision -- Increased pain, redness, or drainage from the incision -- Difficulty swallowing or breathing -- Muscle cramping or spasms -- Numbness or tingling in hands or around lips  The clinic staff is available to answer your questions during regular business hours.  Please don't hesitate to call and ask to speak to one of the nurses if you have concerns.  [facility] OFFICE: [phone] (24 hours)  Please sign up for MyChart accounts. This will allow you to communicate directly with my nurse or myself without having to call the office. It will also allow you   to view your test results. You will need to enroll in MyChart for my office ([name]) and for the hospital ([place]).  Todd Gerkin, MD [facility] A [facility] practice 

## 2021-11-06 NOTE — H&P (Signed)
? ? ? ?REFERRING PHYSICIAN: [name], NP ? ?PROVIDER: Latona Krichbaum Charlotta Newton, MD ? ?Chief Complaint: New Consultation (Primary hyperparathyroidism) ? ?History of Present Illness: ? ?Patient is referred by Ennis Forts, FNP, for surgical evaluation and management of suspected primary hyperparathyroidism. Patient had been noted on routine laboratory studies to have an elevated serum calcium level ranging from 10.7 to as high as 11.9. Subsequent laboratory studies showed an elevated PTH level of 79. Patient was referred for a nuclear medicine parathyroid scan but only underwent the planar examination. This demonstrated suspicion of a right inferior parathyroid adenoma. Patient has had essentially no symptoms. She denies any chronic fatigue. She has mild bone and joint discomfort. She denies nephrolithiasis. Patient has never had a bone density scan. Patient has had no prior head or neck surgery. There is no history of primary parathyroidism. Patient works for [employer] and spends a lot of time on the telephone. She has noted recent changes in her voice with her voice being deeper and also having intermittent episodes of hoarseness and voice fatigue. ? ?Review of Systems: ?A complete review of systems was obtained from the patient. I have reviewed this information and discussed as appropriate with the patient. See HPI as well for other ROS. ? ?Review of Systems  ?Constitutional: Negative.  ?HENT: Negative.  ?Voice changes  ?Eyes: Negative.  ?Respiratory: Negative.  ?Cardiovascular: Negative.  ?Gastrointestinal: Negative.  ?Genitourinary: Negative.  ?Musculoskeletal: Negative.  ?Skin: Negative.  ?Neurological: Negative.  ?Endo/Heme/Allergies: Negative.  ?Psychiatric/Behavioral: Negative.  ? ? ?Medical History: ?Past Medical History:  ?Diagnosis Date  ? Diabetes mellitus without complication (CMS-HCC)  ? GERD (gastroesophageal reflux disease)  ? Hypertension  ? ?Patient Active Problem List  ?Diagnosis  ? Primary  hyperparathyroidism (CMS-HCC)  ? ?Past Surgical History:  ?Procedure Laterality Date  ? Right Ring Finger Pins 2005  ? ? ?Allergies  ?Allergen Reactions  ? Adhesive Tape-Silicones Rash  ?Other reaction(s): Unknown ? ? ?Current Outpatient Medications on File Prior to Visit  ?Medication Sig Dispense Refill  ? albuterol 90 mcg/actuation inhaler Inhale into the lungs  ? amLODIPine (NORVASC) 5 MG tablet 1 tablet  ? atenoloL (TENORMIN) 50 MG tablet 1 tablet  ? dapagliflozin (FARXIGA) 5 mg Tab tablet 1 tablet  ? dicyclomine (BENTYL) 10 mg capsule 2 capsules  ? fexofenadine (ALLEGRA) 180 MG tablet Take 1 tablet by mouth once daily  ? omeprazole (PRILOSEC) 40 MG DR capsule 1 capsule 30 minutes before morning meal  ? ondansetron (ZOFRAN-ODT) 4 MG disintegrating tablet Take 4 mg by mouth every 8 (eight) hours as needed  ? polyethylene glycol (MIRALAX) powder Please take 1 capful dissolved in 8 oz of water prn constipation  ? semaglutide (OZEMPIC) 1 mg/dose (4 mg/3 mL) pen injector Inject subcutaneously  ? spironolactone (ALDACTONE) 25 MG tablet Take 1 tablet by mouth once daily  ? SUMAtriptan (IMITREX) 50 MG tablet 1 tablet at least 2 hours between doses as needed  ? topiramate (TOPAMAX) 50 MG tablet 1 tablet  ? azilsartan medoxomiL (EDARBI) 80 mg Tab 1 tablet  ? ibuprofen (MOTRIN) 200 MG tablet Take by mouth  ? ?No current facility-administered medications on file prior to visit.  ? ?Family History  ?Problem Relation Age of Onset  ? High blood pressure (Hypertension) Mother  ? Hyperlipidemia (Elevated cholesterol) Mother  ? High blood pressure (Hypertension) Father  ? Diabetes Father  ? Stroke Brother  ? High blood pressure (Hypertension) Brother  ? ? ?Social History  ? ?Tobacco Use  ?Smoking Status  Never  ?Smokeless Tobacco Never  ? ? ?Social History  ? ?Socioeconomic History  ? Marital status: Single  ?Tobacco Use  ? Smoking status: Never  ? Smokeless tobacco: Never  ?Substance and Sexual Activity  ? Alcohol use: Yes   ?Comment: 1-2 drinks per week  ? Drug use: Never  ? ?Objective:  ? ?Vitals:  ?BP: 116/76  ?Pulse: 105  ?Temp: 36.6 ?C (97.8 ?F)  ?SpO2: 99%  ?Weight: (!) 174 kg (383 lb 9.6 oz)  ?Height: 162.6 cm ('5\' 4"'$ )  ? ?Body mass index is 65.84 kg/m?. ? ?Physical Exam  ? ?GENERAL APPEARANCE ?Development: normal ?Nutritional status: normal ?Gross deformities: none ? ?SKIN ?Rash, lesions, ulcers: none ?Induration, erythema: none ?Nodules: none palpable ? ?EYES ?Conjunctiva and lids: normal ?Pupils: equal and reactive ?Iris: normal bilaterally ? ?EARS, NOSE, MOUTH, THROAT ?External ears: no lesion or deformity ?External nose: no lesion or deformity ?Hearing: grossly normal ?Due to Covid-19 pandemic, patient is wearing a mask. ? ?NECK ?Symmetric: yes ?Trachea: midline ?Thyroid: no palpable nodules in the thyroid bed ? ?CHEST ?Respiratory effort: normal ?Retraction or accessory muscle use: no ?Breath sounds: normal bilaterally ?Rales, rhonchi, wheeze: none ? ?CARDIOVASCULAR ?Auscultation: regular rhythm, normal rate ?Murmurs: none ?Pulses: radial pulse 2+ palpable ?Lower extremity edema: none ? ?MUSCULOSKELETAL ?Digits and nails: no clubbing or cyanosis ?Muscle strength: grossly normal ?Range of motion: grossly normal ?Deformity: none ? ?LYMPHATIC ?Cervical: none palpable ?Supraclavicular: none palpable ? ?PSYCHIATRIC ?Oriented to person, place, and time: yes ?Mood and affect: normal for situation ?Judgment and insight: appropriate for situation ? ?Assessment and Plan:  ? ?Primary hyperparathyroidism (CMS-HCC) ? ? ?Patient is referred by her primary care provider for surgical evaluation and management of suspected primary hyperparathyroidism. Initial work-up has included a calcium level, PTH levels, and a planar sestamibi scan. ? ?Patient provided with a copy of "Parathyroid Surgery: Treatment for Your Parathyroid Gland Problem", published by [employer], 12 pages. Book reviewed and explained to patient during visit today. ? ?I would  like to further evaluate the patient with an ultrasound examination of the neck, a 25-hydroxy vitamin D level, and a 24-hour urinary collection for calcium. We will make arrangements for the studies in the near future and I will communicate the results to the patient via [place]. If the studies indicate primary hyperparathyroidism, then I believe the patient will be a good candidate for minimally invasive outpatient surgery. We discussed parathyroid surgery today. We discussed the size and location of the surgical incision. We discussed the postoperative recovery and return to work and activities. The patient understands and agrees to further evaluation. ? ?Patient will undergo the above studies. I will be in touch with the patient regarding her results when they are available. We will make a final decision then about how to proceed with further management. ? ?Armandina Gemma MD ?[facility] ?Office: [phone]  ? ?

## 2021-11-07 ENCOUNTER — Encounter (HOSPITAL_COMMUNITY): Payer: Self-pay | Admitting: Surgery

## 2021-11-07 LAB — SURGICAL PATHOLOGY

## 2021-11-07 NOTE — Anesthesia Postprocedure Evaluation (Signed)
Anesthesia Post Note ? ?Patient: Emily Phelps ? ?Procedure(s) Performed: RIGHT INFERIOR PARATHYROIDECTOMY (Right) ? ?  ? ?Patient location during evaluation: PACU ?Anesthesia Type: General ?Level of consciousness: awake ?Pain management: pain level controlled ?Vital Signs Assessment: post-procedure vital signs reviewed and stable ?Respiratory status: spontaneous breathing, nonlabored ventilation, respiratory function stable and patient connected to nasal cannula oxygen ?Cardiovascular status: blood pressure returned to baseline and stable ?Postop Assessment: no apparent nausea or vomiting ?Anesthetic complications: no ? ? ?No notable events documented. ? ?Last Vitals:  ?Vitals:  ? 11/06/21 1000 11/06/21 1018  ?BP:  (!) 156/98  ?Pulse: 73 79  ?Resp: 18 18  ?Temp:    ?SpO2: 96% 95%  ?  ?Last Pain:  ?Vitals:  ? 11/06/21 1018  ?TempSrc:   ?PainSc: 4   ? ? ?  ?  ?  ?  ?  ?  ? ?Chealsea Paske P Keshav Winegar ? ? ? ? ?

## 2021-11-14 ENCOUNTER — Other Ambulatory Visit: Payer: Self-pay | Admitting: Nurse Practitioner

## 2021-11-14 DIAGNOSIS — E119 Type 2 diabetes mellitus without complications: Secondary | ICD-10-CM

## 2021-11-18 ENCOUNTER — Encounter: Payer: Self-pay | Admitting: Nurse Practitioner

## 2021-11-18 ENCOUNTER — Ambulatory Visit (INDEPENDENT_AMBULATORY_CARE_PROVIDER_SITE_OTHER): Payer: BC Managed Care – PPO | Admitting: Nurse Practitioner

## 2021-11-18 VITALS — BP 130/74 | HR 97 | Temp 98.7°F | Ht 64.0 in | Wt 376.0 lb

## 2021-11-18 DIAGNOSIS — I10 Essential (primary) hypertension: Secondary | ICD-10-CM

## 2021-11-18 DIAGNOSIS — E1169 Type 2 diabetes mellitus with other specified complication: Secondary | ICD-10-CM

## 2021-11-18 DIAGNOSIS — E119 Type 2 diabetes mellitus without complications: Secondary | ICD-10-CM

## 2021-11-18 DIAGNOSIS — E1159 Type 2 diabetes mellitus with other circulatory complications: Secondary | ICD-10-CM

## 2021-11-18 DIAGNOSIS — Z6841 Body Mass Index (BMI) 40.0 and over, adult: Secondary | ICD-10-CM

## 2021-11-18 DIAGNOSIS — I152 Hypertension secondary to endocrine disorders: Secondary | ICD-10-CM

## 2021-11-18 DIAGNOSIS — Z794 Long term (current) use of insulin: Secondary | ICD-10-CM

## 2021-11-18 DIAGNOSIS — H1031 Unspecified acute conjunctivitis, right eye: Secondary | ICD-10-CM | POA: Diagnosis not present

## 2021-11-18 MED ORDER — ERYTHROMYCIN 5 MG/GM OP OINT: 1.0000 "application " | TOPICAL_OINTMENT | Freq: Every day | OPHTHALMIC | 0 refills | Status: DC

## 2021-11-18 MED ORDER — BACITRACIN-POLYMYXIN B 500-10000 UNIT/GM OP OINT: TOPICAL_OINTMENT | Freq: Two times a day (BID) | OPHTHALMIC | 0 refills | Status: DC

## 2021-11-18 NOTE — Progress Notes (Addendum)
I,Tianna Badgett,acting as a Education administrator for [employer], FNP.,have documented all relevant documentation on the behalf of [name], FNP,as directed by  [name], FNP while in the presence of [name], [place].  This visit occurred during the SARS-CoV-2 public health emergency.  Safety protocols were in place, including screening questions prior to the visit, additional usage of staff PPE, and extensive cleaning of exam room while observing appropriate contact time as indicated for disinfecting solutions.  Subjective:     Patient ID: Emily Phelps , female    DOB: Jun 23, 1981 , 41 y.o.   MRN: [id number]   Chief Complaint  Patient presents with   Diabetes    HPI  Patient presents today for a bp and diabetes check.  Wt Readings from Last 3 Encounters: 11/18/21 : (!) 376 lb (170.6 kg) 11/06/21 : (!) 384 lb (174.2 kg) 10/21/21 : (!) 384 lb (174.2 kg)  She is trying to focus on her diet. She did go to an Physicist, medical for bariatric surgery [facility]. She continues to go to [facility] - she was told she does not eat enough.  She had her parathyroid removed last week.   Diabetes She presents for her follow-up diabetic visit. She has type 2 diabetes mellitus. There are no hypoglycemic associated symptoms. Pertinent negatives for hypoglycemia include no headaches. There are no diabetic associated symptoms. Pertinent negatives for diabetes include no chest pain. There are no hypoglycemic complications. There are no diabetic complications. Risk factors for coronary artery disease include obesity and sedentary lifestyle. Current diabetic treatment includes oral agent (dual therapy). She is compliant with treatment all of the time. Diabetic current diet: she has been doing a keto diet. When asked about meal planning, she reported none. She has not had a previous visit with a dietitian. (Blood sugar fasting 120-130 ) An ACE inhibitor/angiotensin II receptor blocker is being taken.  She does not see a podiatrist.Eye exam is not current.  Hypertension This is a chronic problem. The current episode started more than 1 year ago. The problem is unchanged. The problem is controlled. Pertinent negatives include no anxiety, chest pain, headaches or palpitations. Risk factors for coronary artery disease include obesity and diabetes mellitus. There is no history of chronic renal disease.  Headache  This is a chronic problem. The current episode started more than 1 year ago. The problem occurs intermittently (having at least 4 times a week). The pain is located in the Temporal (temples and behind her eyes) region. The quality of the pain is described as aching. Pertinent negatives include no abdominal pain. She has tried nothing for the symptoms. Her past medical history is significant for hypertension.     Past Medical History:  Diagnosis Date   Allergy    Diabetes mellitus ([place])    Family history of adverse reaction to anesthesia    mother had n/v after    Family history of hypertrophic cardiomyopathy 05/13/2021   GERD (gastroesophageal reflux disease)    Hyperlipidemia    Hypertension    Migraine    Obesity    Palpitations 05/13/2021   Pneumonia    Rectal bleeding    Resistant hypertension 11/28/2014   Sleep apnea    Wears contact lenses      Family History  Problem Relation Age of Onset   Hypertension Mother    Colon polyps Mother    Diabetes Father    Hypertension Father    Prostate cancer Father    Hypertension Brother  Thyroid disease Maternal Aunt    Heart disease Maternal Uncle    Kidney failure Maternal Uncle    Hypertension Maternal Grandmother    Heart disease Maternal Grandmother    Hypertrophic cardiomyopathy Maternal Grandmother    Colon cancer Neg Hx    Esophageal cancer Neg Hx    Liver cancer Neg Hx    Stomach cancer Neg Hx    Rectal cancer Neg Hx      Current Outpatient Medications:    erythromycin ophthalmic ointment, Place 1  application. into the right eye at bedtime., Disp: 3.5 g, Rfl: 0   albuterol (VENTOLIN HFA) 108 (90 Base) MCG/ACT inhaler, Inhale 2 puffs into the lungs every 6 (six) hours as needed for wheezing or shortness of breath., Disp: 6.7 g, Rfl: 0   amLODipine (NORVASC) 10 MG tablet, Take 1 tablet (10 mg total) by mouth daily., Disp: 90 tablet, Rfl: 3   atenolol (TENORMIN) 100 MG tablet, Take 1 tablet (100 mg total) by mouth daily., Disp: 90 tablet, Rfl: 3   Continuous Blood Gluc Receiver ([place]) DEVI, Use to check blood sugars dx code e11.65, Disp: 3 each, Rfl: 3   Continuous Blood Gluc Sensor (DEXCOM G6 SENSOR) MISC, USE AS DIRECTED CHANGE EVERY 10 DAYS, Disp: 3 each, Rfl: 1   Continuous Blood Gluc Transmit (DEXCOM G6 TRANSMITTER) MISC, USE TO CHECK BLOOD SUGAR. CHANGE EVERY 90 DAYS, Disp: 1 each, Rfl: 3   Cyanocobalamin (VITAMIN B12) 1000 MCG TBCR, Take 1,000 mcg by mouth daily. , Disp: , Rfl:    dapagliflozin propanediol (FARXIGA) 5 MG TABS tablet, Take 1 tablet (5 mg total) by mouth daily., Disp: 90 tablet, Rfl: 1   dicyclomine (BENTYL) 10 MG capsule, TAKE 1 CAPSULE BY MOUTH 4 TIMES DAILY AS NEEDED FOR SPASMS (Patient taking differently: Take 10 mg by mouth 4 (four) times daily -  before meals and at bedtime.), Disp: 30 capsule, Rfl: 2   fexofenadine (ALLEGRA) 180 MG tablet, TAKE 1 TABLET BY MOUTH EVERY DAY, Disp: 30 tablet, Rfl: 1   ibuprofen (ADVIL) 200 MG tablet, Take 200 mg by mouth every 6 (six) hours as needed for moderate pain., Disp: , Rfl:    medroxyPROGESTERone (PROVERA) 10 MG tablet, Take 1 tablet by mouth daily., Disp: , Rfl:    Multiple Vitamin (MULTIVITAMIN WITH MINERALS) TABS tablet, Take 1 tablet by mouth daily., Disp: , Rfl:    omeprazole (PRILOSEC) 40 MG capsule, TAKE 1 CAPSULE BY MOUTH 2 TIMES DAILY (Patient taking differently: Take 40 mg by mouth 2 (two) times daily.), Disp: 180 capsule, Rfl: 3   ondansetron (ZOFRAN ODT) 4 MG disintegrating tablet, Take 1 tablet (4 mg  total) by mouth every 8 (eight) hours as needed for nausea or vomiting., Disp: 20 tablet, Rfl: 0   OZEMPIC, 2 MG/DOSE, 8 MG/3ML SOPN, Inject 2 mg into the skin once a week., Disp: 9 mL, Rfl: 0   polyethylene glycol powder (MIRALAX) 17 GM/SCOOP powder, Please take 1 capful dissolved in 8 oz of water prn constipation (Patient taking differently: Take 17 g by mouth daily as needed for mild constipation.), Disp: 238 g, Rfl: 1   spironolactone (ALDACTONE) 50 MG tablet, Take 1 tablet (50 mg total) by mouth daily., Disp: 90 tablet, Rfl: 1   SUMAtriptan (IMITREX) 50 MG tablet, TAKE 1 TABLET BY MOUTH EVERY 2 HOURS AS NEEDED FOR migraine, may repeat in 2 hours if headache persists or recurs (Patient taking differently: Take 50 mg by mouth every 2 (two) hours  as needed for migraine.), Disp: 10 tablet, Rfl: 1   topiramate (TOPAMAX) 50 MG tablet, Take 1 tablet (50 mg total) by mouth 2 (two) times daily., Disp: 180 tablet, Rfl: 1   traMADol (ULTRAM) 50 MG tablet, Take 1-2 tablets (50-100 mg total) by mouth every 6 (six) hours as needed for moderate pain., Disp: 15 tablet, Rfl: 0   valsartan (DIOVAN) 320 MG tablet, Take 1 tablet (320 mg total) by mouth daily., Disp: 90 tablet, Rfl: 3   VITAMIN D PO, Take 1,000 Units by mouth daily., Disp: , Rfl:    zolpidem (AMBIEN) 10 MG tablet, Take 1 tablet (10 mg total) by mouth at bedtime as needed for sleep., Disp: 30 tablet, Rfl: 3   Allergies  Allergen Reactions   Silicone Rash    Other reaction(s): Unknown   Tape Rash    Other reaction(s): Unknown Other reaction(s): Unknown     Review of Systems  Constitutional: Negative.   Respiratory: Negative.    Cardiovascular: Negative.  Negative for chest pain and palpitations.  Gastrointestinal: Negative.  Negative for abdominal pain.  Neurological: Negative.  Negative for headaches.  Psychiatric/Behavioral: Negative.       Today's Vitals   11/18/21 0845  BP: 130/74  Pulse: 97  Temp: 98.7 F (37.1 C)  TempSrc:  Oral  Weight: (!) 376 lb (170.6 kg)  Height: '5\' 4"'$  (1.626 m)   Body mass index is 64.54 kg/m.  Wt Readings from Last 3 Encounters:  12/20/21 (!) 380 lb (172.4 kg)  11/21/21 (!) 378 lb 12.8 oz (171.8 kg)  11/18/21 (!) 376 lb (170.6 kg)    Objective:  Physical Exam Vitals reviewed.  Constitutional:      General: She is not in acute distress.    Appearance: Normal appearance. She is obese.  Cardiovascular:     Rate and Rhythm: Normal rate and regular rhythm.     Pulses: Normal pulses.     Heart sounds: Normal heart sounds. No murmur heard. Pulmonary:     Effort: Pulmonary effort is normal. No respiratory distress.     Breath sounds: Normal breath sounds. No wheezing.  Skin:    General: Skin is warm and dry.     Capillary Refill: Capillary refill takes less than 2 seconds.     Coloration: Skin is not jaundiced.  Neurological:     General: No focal deficit present.     Mental Status: She is alert and oriented to person, place, and time.     Cranial Nerves: No cranial nerve deficit.     Motor: No weakness.  Psychiatric:        Mood and Affect: Mood normal.        Behavior: Behavior normal.        Thought Content: Thought content normal.        Judgment: Judgment normal.         Assessment And Plan:     1. Obesity, diabetes, and hypertension syndrome ([place]) Comments: Continue current medications, blood pressure is fairly controlled. Diabetes is controlled  2. Morbid obesity with BMI of 60.0-69.9, adult ([place]) She is encouraged to strive for BMI less than 30 to decrease cardiac risk. Advised to aim for at least 150 minutes of exercise per week.  3. Acute bacterial conjunctivitis of right eye - erythromycin ophthalmic ointment; Place 1 application. into the right eye at bedtime.  Dispense: 3.5 g; Refill: 0     Patient was given opportunity to ask questions. Patient verbalized understanding of  the plan and was able to repeat key elements of the plan. All questions were  answered to their satisfaction.  [name], FNP   I, [name], FNP, have reviewed all documentation for this visit. The documentation on 02/07/22 for the exam, diagnosis, procedures, and orders are all accurate and complete.   IF YOU HAVE BEEN REFERRED TO A SPECIALIST, IT MAY TAKE 1-2 WEEKS TO SCHEDULE/PROCESS THE REFERRAL. IF YOU HAVE NOT HEARD FROM US/SPECIALIST IN TWO WEEKS, PLEASE GIVE [place] A CALL AT [phone].   THE PATIENT IS ENCOURAGED TO PRACTICE SOCIAL DISTANCING DUE TO THE COVID-19 PANDEMIC.

## 2021-11-18 NOTE — Patient Instructions (Signed)

## 2021-11-20 DIAGNOSIS — E892 Postprocedural hypoparathyroidism: Secondary | ICD-10-CM | POA: Insufficient documentation

## 2021-11-21 ENCOUNTER — Ambulatory Visit (INDEPENDENT_AMBULATORY_CARE_PROVIDER_SITE_OTHER): Payer: BC Managed Care – PPO | Admitting: Pharmacist

## 2021-11-21 VITALS — BP 122/82 | HR 87 | Resp 15 | Ht 64.0 in | Wt 378.8 lb

## 2021-11-21 DIAGNOSIS — I10 Essential (primary) hypertension: Secondary | ICD-10-CM | POA: Diagnosis not present

## 2021-11-21 NOTE — Progress Notes (Signed)
Patient ID: Emily Phelps                 DOB: August 25, 1980                      MRN: [id number]     HPI: Emily Phelps is a 41 y.o. female referred by Dr. Oval Linsey to HTN clinic. PMH is significant for HTN, DM (A1c 5.3), obesity, migraines, OSA, and HLD (LDL 129).  Patient is post parathyroidectomy on 11/06/21. Cleared back to work yesterday.  Calcium 9.9, parathyroid hormone level 31.  Patient has been trying to check blood pressure at home but unfortunately cuff is too small for her arm and she continues to receive error messages.  Has continued to go to [facility] weight loss every 2 weeks. Has not been able to lose much weight despite being on Ozempic 2.'0mg'$  weekly. Reports is following the prescribed 1500 calorie diet plan but is struggling to eat the required amount of protein prescribed and is often not hungry and has to force herself to eat. Has not been exercising. Is discouraged by lack of weight loss because it is preventing her from having from having uterine polypectomy.  Patient presents today for follow up.  Copay on her Edarbi increased and she was recently switched to valsartan. Has had a stressful January as well and more family members passed away.  Recently spent a week in [place] for a business trip and while she was away from home she ate out three meals a day. Ozempic was increased to 2.'0mg'$  weekly. Continues to go to [employer].  At last visit with Dr Oval Linsey, amlodipine and atenolol were increased. Patient reports no adverse effects.  Current HTN meds:  Valsartan '320mg'$  daily Sprinolactone '25mg'$  daily Amlodipine '10mg'$  daily Atenolol '100mg'$  daily  Previously tried: [place] (d/c due to cost) BP goal: <130/80   Wt Readings from Last 3 Encounters:  11/18/21 (!) 376 lb (170.6 kg)  11/06/21 (!) 384 lb (174.2 kg)  10/21/21 (!) 384 lb (174.2 kg)   BP Readings from Last 3 Encounters:  11/18/21 130/74  11/06/21 (!) 156/98  10/21/21 (!) 144/87   Pulse  Readings from Last 3 Encounters:  11/18/21 97  11/06/21 79  10/21/21 83    Renal function: CrCl cannot be calculated (Patient's most recent lab result is older than the maximum 21 days allowed.).  Past Medical History:  Diagnosis Date   Allergy    Diabetes mellitus ([place])    Family history of adverse reaction to anesthesia    mother had n/v after    Family history of hypertrophic cardiomyopathy 05/13/2021   GERD (gastroesophageal reflux disease)    Hyperlipidemia    Hypertension    Migraine    Obesity    Palpitations 05/13/2021   Pneumonia    Rectal bleeding    Resistant hypertension 11/28/2014   Sleep apnea    Wears contact lenses     Current Outpatient Medications on File Prior to Visit  Medication Sig Dispense Refill   albuterol (VENTOLIN HFA) 108 (90 Base) MCG/ACT inhaler Inhale 2 puffs into the lungs every 6 (six) hours as needed for wheezing or shortness of breath. 6.7 g 0   amLODipine (NORVASC) 10 MG tablet Take 1 tablet (10 mg total) by mouth daily. 90 tablet 3   atenolol (TENORMIN) 100 MG tablet Take 1 tablet (100 mg total) by mouth daily. 90 tablet 3   Continuous Blood Gluc Receiver ([place]) DEVI  Use to check blood sugars dx code e11.65 3 each 3   Continuous Blood Gluc Sensor (DEXCOM G6 SENSOR) MISC uad- CHANGE EVERY 10 DAYS 3 each 0   Continuous Blood Gluc Transmit (DEXCOM G6 TRANSMITTER) MISC USE TO CHECK BLOOD SUGAR. CHANGE EVERY 90 DAYS 1 each 3   Cyanocobalamin (VITAMIN B12) 1000 MCG TBCR Take 1,000 mcg by mouth daily.      dapagliflozin propanediol (FARXIGA) 5 MG TABS tablet Take 1 tablet (5 mg total) by mouth daily. 90 tablet 1   dicyclomine (BENTYL) 10 MG capsule TAKE 1 CAPSULE BY MOUTH 4 TIMES DAILY AS NEEDED FOR SPASMS (Patient taking differently: Take 10 mg by mouth 4 (four) times daily -  before meals and at bedtime.) 30 capsule 2   erythromycin ophthalmic ointment Place 1 application. into the right eye at bedtime. 3.5 g 0   fexofenadine  (ALLEGRA) 180 MG tablet TAKE 1 TABLET BY MOUTH EVERY DAY (Patient taking differently: Take 180 mg by mouth daily.) 30 tablet 1   ibuprofen (ADVIL) 200 MG tablet Take 200 mg by mouth every 6 (six) hours as needed for moderate pain.     Multiple Vitamin (MULTIVITAMIN WITH MINERALS) TABS tablet Take 1 tablet by mouth daily.     omeprazole (PRILOSEC) 40 MG capsule TAKE 1 CAPSULE BY MOUTH 2 TIMES DAILY (Patient taking differently: Take 40 mg by mouth 2 (two) times daily.) 180 capsule 3   ondansetron (ZOFRAN ODT) 4 MG disintegrating tablet Take 1 tablet (4 mg total) by mouth every 8 (eight) hours as needed for nausea or vomiting. 20 tablet 0   OZEMPIC, 2 MG/DOSE, 8 MG/3ML SOPN Inject 2 mg into the skin once a week. 9 mL 0   polyethylene glycol powder (MIRALAX) 17 GM/SCOOP powder Please take 1 capful dissolved in 8 oz of water prn constipation (Patient taking differently: Take 17 g by mouth daily as needed for mild constipation.) 238 g 1   spironolactone (ALDACTONE) 50 MG tablet Take 1 tablet (50 mg total) by mouth daily. 90 tablet 1   SUMAtriptan (IMITREX) 50 MG tablet TAKE 1 TABLET BY MOUTH EVERY 2 HOURS AS NEEDED FOR migraine, may repeat in 2 hours if headache persists or recurs (Patient taking differently: Take 50 mg by mouth every 2 (two) hours as needed for migraine.) 10 tablet 1   topiramate (TOPAMAX) 50 MG tablet TAKE 1 TABLET BY MOUTH 2 TIMES DAILY (Patient taking differently: Take 50 mg by mouth 2 (two) times daily.) 180 tablet 1   traMADol (ULTRAM) 50 MG tablet Take 1-2 tablets (50-100 mg total) by mouth every 6 (six) hours as needed for moderate pain. 15 tablet 0   valsartan (DIOVAN) 320 MG tablet Take 1 tablet (320 mg total) by mouth daily. 90 tablet 3   VITAMIN D PO Take 1,000 Units by mouth daily.     zolpidem (AMBIEN) 10 MG tablet Take 1 tablet (10 mg total) by mouth at bedtime as needed for sleep. 30 tablet 3   No current facility-administered medications on file prior to visit.     Allergies  Allergen Reactions   Silicone Rash    Other reaction(s): Unknown   Tape Rash    Other reaction(s): Unknown     Assessment/Plan:  1. Hypertension -  Patient BP in room 122/82 which is slightly above goal of <130/80.  Dr Blenda Mounts previous plan stated to consider starting HCTZ after parathyroidectomy however BP improving.  May likely reach goal with diet and exercise.   Continue:  Valsartan '320mg'$  daily Sprinolactone '25mg'$  daily Amlodipine '10mg'$  daily Atenolol '100mg'$  daily Recheck with Dr Oval Linsey in 2 months  2. Weight loss: Patient is struggling with weight and following diet program with [facility]. Advised if she would like a second opinion I would be happy to see her for weight loss follow up and patient is agreeable.   Continue Ozempic 2.'0mg'$  weekly Recheck in 4 weeks   Karren Cobble, PharmD, [place], [place], [place], [place], [place], [address] Phone: [phone], Fax: [phone]

## 2021-11-21 NOTE — Patient Instructions (Addendum)
It was good seeing you again ? ?Your blood pressure today looks good at 122/82 ? ?Please continue your:  ? ?Valsartan '320mg'$  daily ?Sprinolactone '25mg'$  daily ?Amlodipine '10mg'$  daily ?Atenolol '100mg'$  daily ? ?I will see you back in 4 weeks to discuss weight loss.  Continue your Ozempic 2.'0mg'$  once weekly. ? ?Karren Cobble, PharmD, BCACP, [place], CPP ?[address], [address] ?[place], [place], [address] ?Phone: [phone], Fax: [phone]  ?

## 2021-11-26 ENCOUNTER — Telehealth: Payer: Self-pay | Admitting: Pharmacist Clinician (PhC)/ Clinical Pharmacy Specialist

## 2021-11-26 NOTE — Telephone Encounter (Signed)
Patient was in office for Advanced Hypertension research appointment, currently has RPM cuff through [place].  First cuff she was given stopped connecting to the app and we were unable to get the connection re-started.  She was given a new cuff, however it has been shown to be highly inaccurate due to cuff being too large for both upper arm and forearm.  Cuff was returned to [facility] clinic while in office today. ?

## 2021-12-02 ENCOUNTER — Other Ambulatory Visit: Payer: Self-pay | Admitting: Nurse Practitioner

## 2021-12-20 ENCOUNTER — Ambulatory Visit (INDEPENDENT_AMBULATORY_CARE_PROVIDER_SITE_OTHER): Payer: BC Managed Care – PPO | Admitting: Pharmacist Clinician (PhC)/ Clinical Pharmacy Specialist

## 2021-12-20 DIAGNOSIS — R634 Abnormal weight loss: Secondary | ICD-10-CM

## 2021-12-20 NOTE — Progress Notes (Unsigned)
HPI: Emily Phelps is a 41 y.o. female patient referred to pharmacy clinic by Dr. Oval Linsey to initiate weight loss therapy with GLP1-RA.  Most recent BMI 64.99.    Needs uterine polypectomy, has to lose weight first  On ozempic 2 mg - noted not hungry much but trying to eat required amts from [facility]  Medroxyprogesterone - on cycle since April; unsure if surgery is option  Significant medical history: hypertension Controlled at last visit on valsartan 320, spironolactone 50, atenolol 100 and amlodipine 10  hyperlipidemia   DM2 4/23 A1c 5.0 (was at 10.5 02/2019), well controlled on Ozempic, Farxiga  migraine Has prn sumatriptan - 1-2 times per month       Current weight management medications: Ozempic 2 mg   Previously tried meds:   Current meds that may affect weight:   Baseline weight/BMI:   Insurance payor:   Diet: yesterday -Breakfast:2 eggs, 4 strips of bacon, coffe, 20 oz water -Lunch: -Dinner: -Snacks: -Drinks:  Exercise:   Family History:   Confirmed patient not ***pregnant and no personal or family history of medullary thyroid carcinoma (MTC) or Multiple Endocrine Neoplasia syndrome type 2 (MEN 2).   Social History:   Labs: Lab Results  Component Value Date   HGBA1C 5.0 10/21/2021    Wt Readings from Last 1 Encounters:  11/21/21 (!) 378 lb 12.8 oz (171.8 kg)    BP Readings from Last 1 Encounters:  11/21/21 122/82   Pulse Readings from Last 1 Encounters:  11/21/21 87       Component Value Date/Time   CHOL 192 06/20/2021 1455   TRIG 153 (H) 06/20/2021 1455   HDL 35 (L) 06/20/2021 1455   CHOLHDL 5.5 (H) 06/20/2021 1455   CHOLHDL 7 03/11/2019 1125   VLDL 38.8 09/24/2018 1458   LDLCALC 129 (H) 06/20/2021 1455   LDLDIRECT 27.0 03/11/2019 1125    Past Medical History:  Diagnosis Date   Allergy    Diabetes mellitus ([place])    Family history of adverse reaction to anesthesia    mother had n/v after    Family history of hypertrophic  cardiomyopathy 05/13/2021   GERD (gastroesophageal reflux disease)    Hyperlipidemia    Hypertension    Migraine    Obesity    Palpitations 05/13/2021   Pneumonia    Rectal bleeding    Resistant hypertension 11/28/2014   Sleep apnea    Wears contact lenses     Current Outpatient Medications on File Prior to Visit  Medication Sig Dispense Refill   albuterol (VENTOLIN HFA) 108 (90 Base) MCG/ACT inhaler Inhale 2 puffs into the lungs every 6 (six) hours as needed for wheezing or shortness of breath. 6.7 g 0   amLODipine (NORVASC) 10 MG tablet Take 1 tablet (10 mg total) by mouth daily. 90 tablet 3   atenolol (TENORMIN) 100 MG tablet Take 1 tablet (100 mg total) by mouth daily. 90 tablet 3   Continuous Blood Gluc Receiver (DEXCOM G6 RECEIVER) DEVI Use to check blood sugars dx code e11.65 3 each 3   Continuous Blood Gluc Sensor (DEXCOM G6 SENSOR) MISC USE AS DIRECTED CHANGE EVERY 10 DAYS 3 each 0   Continuous Blood Gluc Transmit (DEXCOM G6 TRANSMITTER) MISC USE TO CHECK BLOOD SUGAR. CHANGE EVERY 90 DAYS 1 each 3   Cyanocobalamin (VITAMIN B12) 1000 MCG TBCR Take 1,000 mcg by mouth daily.      dapagliflozin propanediol (FARXIGA) 5 MG TABS tablet Take 1 tablet (5 mg total) by mouth daily.  90 tablet 1   dicyclomine (BENTYL) 10 MG capsule TAKE 1 CAPSULE BY MOUTH 4 TIMES DAILY AS NEEDED FOR SPASMS (Patient taking differently: Take 10 mg by mouth 4 (four) times daily -  before meals and at bedtime.) 30 capsule 2   erythromycin ophthalmic ointment Place 1 application. into the right eye at bedtime. 3.5 g 0   fexofenadine (ALLEGRA) 180 MG tablet TAKE 1 TABLET BY MOUTH EVERY DAY (Patient taking differently: Take 180 mg by mouth daily.) 30 tablet 1   ibuprofen (ADVIL) 200 MG tablet Take 200 mg by mouth every 6 (six) hours as needed for moderate pain.     medroxyPROGESTERone (PROVERA) 10 MG tablet Take 1 tablet by mouth daily.     Multiple Vitamin (MULTIVITAMIN WITH MINERALS) TABS tablet Take 1 tablet by  mouth daily.     omeprazole (PRILOSEC) 40 MG capsule TAKE 1 CAPSULE BY MOUTH 2 TIMES DAILY (Patient taking differently: Take 40 mg by mouth 2 (two) times daily.) 180 capsule 3   ondansetron (ZOFRAN ODT) 4 MG disintegrating tablet Take 1 tablet (4 mg total) by mouth every 8 (eight) hours as needed for nausea or vomiting. 20 tablet 0   OZEMPIC, 2 MG/DOSE, 8 MG/3ML SOPN Inject 2 mg into the skin once a week. 9 mL 0   polyethylene glycol powder (MIRALAX) 17 GM/SCOOP powder Please take 1 capful dissolved in 8 oz of water prn constipation (Patient taking differently: Take 17 g by mouth daily as needed for mild constipation.) 238 g 1   spironolactone (ALDACTONE) 50 MG tablet Take 1 tablet (50 mg total) by mouth daily. 90 tablet 1   SUMAtriptan (IMITREX) 50 MG tablet TAKE 1 TABLET BY MOUTH EVERY 2 HOURS AS NEEDED FOR migraine, may repeat in 2 hours if headache persists or recurs (Patient taking differently: Take 50 mg by mouth every 2 (two) hours as needed for migraine.) 10 tablet 1   topiramate (TOPAMAX) 50 MG tablet TAKE 1 TABLET BY MOUTH 2 TIMES DAILY (Patient taking differently: Take 50 mg by mouth 2 (two) times daily.) 180 tablet 1   traMADol (ULTRAM) 50 MG tablet Take 1-2 tablets (50-100 mg total) by mouth every 6 (six) hours as needed for moderate pain. 15 tablet 0   valsartan (DIOVAN) 320 MG tablet Take 1 tablet (320 mg total) by mouth daily. 90 tablet 3   VITAMIN D PO Take 1,000 Units by mouth daily.     zolpidem (AMBIEN) 10 MG tablet Take 1 tablet (10 mg total) by mouth at bedtime as needed for sleep. 30 tablet 3   No current facility-administered medications on file prior to visit.    Allergies  Allergen Reactions   Silicone Rash    Other reaction(s): Unknown   Tape Rash    Other reaction(s): Unknown Other reaction(s): Unknown    No problem-specific Assessment & Plan notes found for this encounter.   Tommy Medal PharmD CPP [place]

## 2021-12-20 NOTE — Patient Instructions (Signed)
Return for a a follow up appointment with Dr. Oval Linsey in August  We will put in a referral to the PREP exercise program   Try going out a lunch to walk 10-15 minutes most days of the week.    See how many steps you take in a day then try to increase that by 200-400 steps each week or two  Take your meds as follows:  Continue with Ozempic for now.  Talk to your other provider about trying Mounjaro to see if you can get better results  Bring all of your meds, your BP cuff and your record of home blood pressures to your next appointment.  Exercise as you're able, try to walk approximately 30 minutes per day.  Keep salt intake to a minimum, especially watch canned and prepared boxed foods.  Eat more fresh fruits and vegetables and fewer canned items.  Avoid eating in fast food restaurants.    HOW TO TAKE YOUR BLOOD PRESSURE: Rest 5 minutes before taking your blood pressure.  Don't smoke or drink caffeinated beverages for at least 30 minutes before. Take your blood pressure before (not after) you eat. Sit comfortably with your back supported and both feet on the floor (don't cross your legs). Elevate your arm to heart level on a table or a desk. Use the proper sized cuff. It should fit smoothly and snugly around your bare upper arm. There should be enough room to slip a fingertip under the cuff. The bottom edge of the cuff should be 1 inch above the crease of the elbow. Ideally, take 3 measurements at one sitting and record the averag

## 2021-12-21 ENCOUNTER — Encounter: Payer: Self-pay | Admitting: Pharmacist Clinician (PhC)/ Clinical Pharmacy Specialist

## 2021-12-21 NOTE — Assessment & Plan Note (Signed)
Patient with morbid obesity, not having success on Ozempic 2 mg.  Working with [facility] clinic and has follow up with them today.  Suggested they discuss switching to [facility] for better benefit.  Discussed need to cut meal size down when using medication and stop eating when starts to feel full.  Also had long discussion on need for some exercise.  Will refer her to PREP program, as he job has her mostly sedentary.

## 2021-12-23 ENCOUNTER — Other Ambulatory Visit: Payer: Self-pay

## 2021-12-23 MED ORDER — TOPIRAMATE 50 MG PO TABS: 50.0000 mg | ORAL_TABLET | Freq: Two times a day (BID) | ORAL | 1 refills | Status: DC

## 2021-12-27 ENCOUNTER — Telehealth: Payer: Self-pay

## 2021-12-27 NOTE — Telephone Encounter (Signed)
Call to pt reference PREP referral LVMT pt requesting call back to discuss

## 2021-12-30 ENCOUNTER — Other Ambulatory Visit: Payer: Self-pay | Admitting: Nurse Practitioner

## 2022-01-01 IMAGING — CT CT ABD-PELV W/ CM
2 of 4 series · 17 of 46 positions shown, 19 images · IV contrast (omnipaque)
Comparison: 01/02/2020

CLINICAL DATA: Abdominal pain with diarrhea and fevers

EXAM:
CT ABDOMEN AND PELVIS WITH CONTRAST
TECHNIQUE: Multidetector CT imaging of the abdomen and pelvis was performed
using the standard protocol following bolus administration of
intravenous contrast.
CONTRAST:  100mL OMNIPAQUE IOHEXOL 300 MG/ML  SOLN

[Series 3: abdomen 5.0 · axial · 0.98mm/px · z∈[+880,+1275]mm · 14 of 89 slices shown, 16 images]
[im 5/89  soft-tissue]
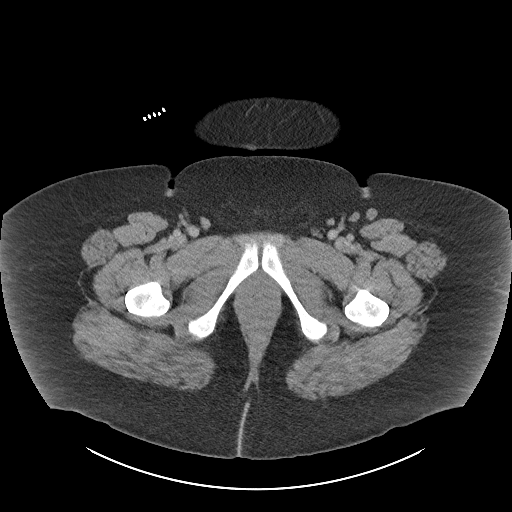
[im 5/89  bone]
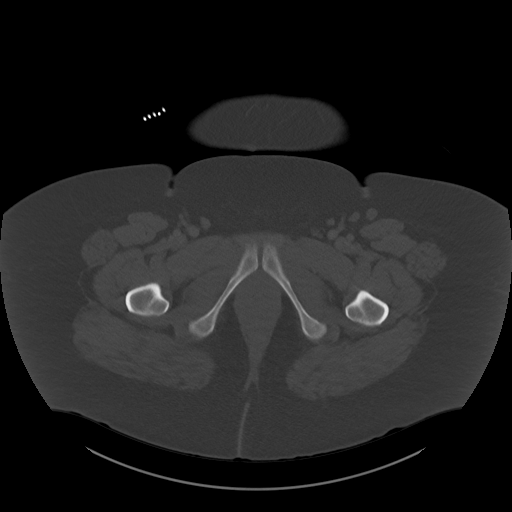
[im 14/89  soft-tissue]
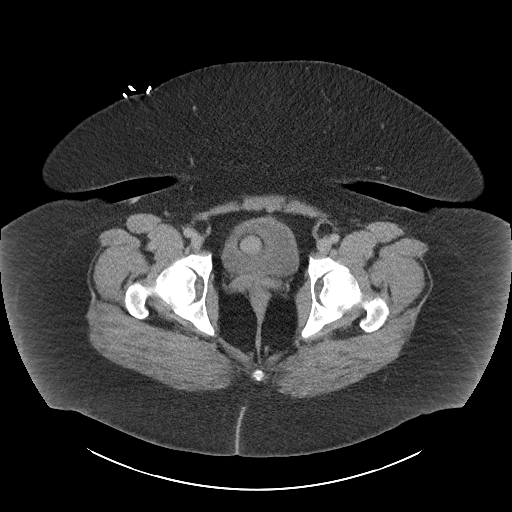
[im 18/89  soft-tissue]
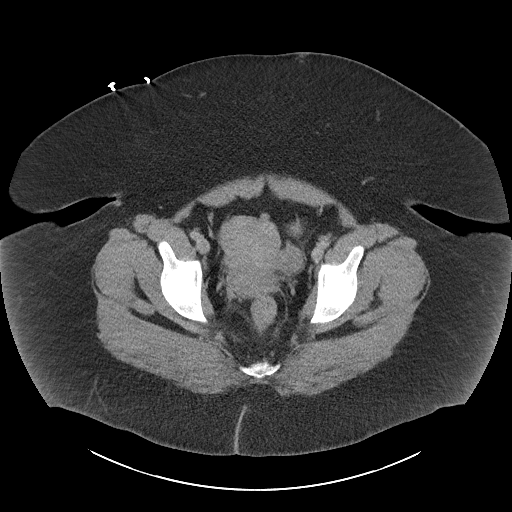
[im 23/89  soft-tissue]
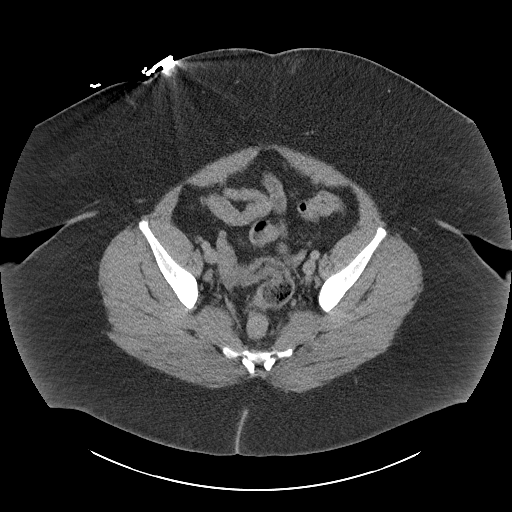
[im 31/89  soft-tissue]
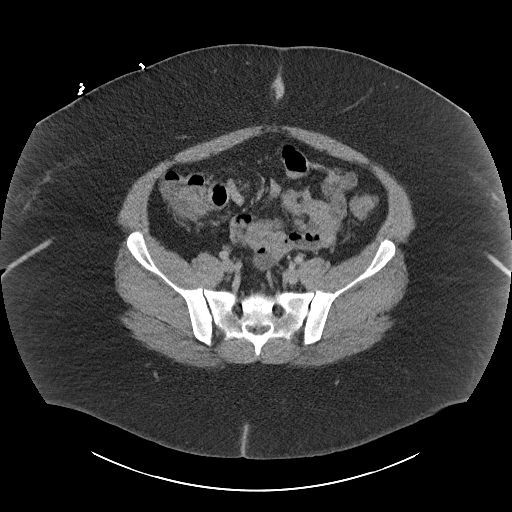
[im 36/89  soft-tissue]
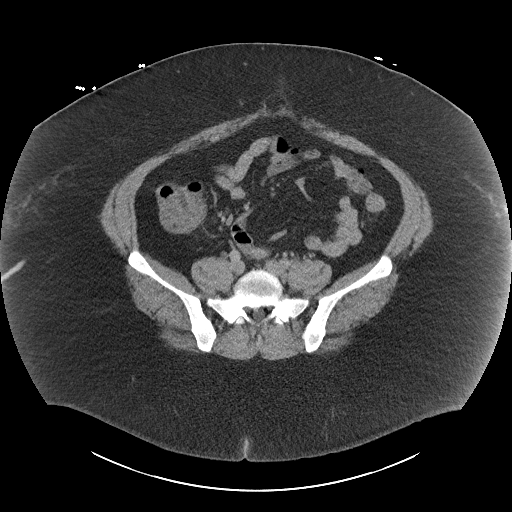
[im 40/89  soft-tissue]
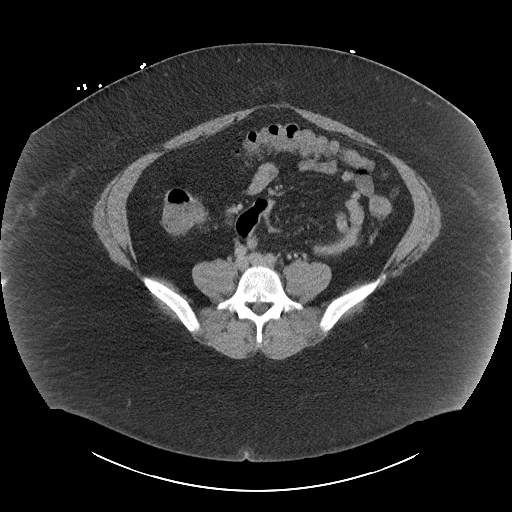
[im 49/89  soft-tissue]
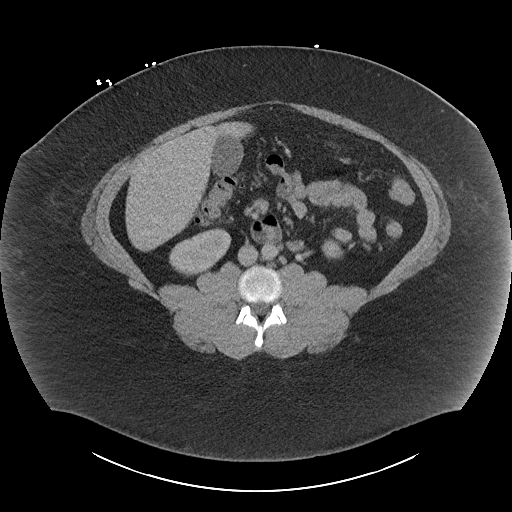
[im 53/89  soft-tissue]
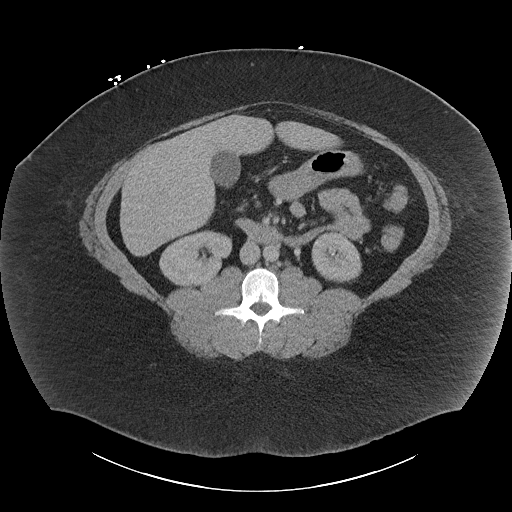
[im 53/89  bone]
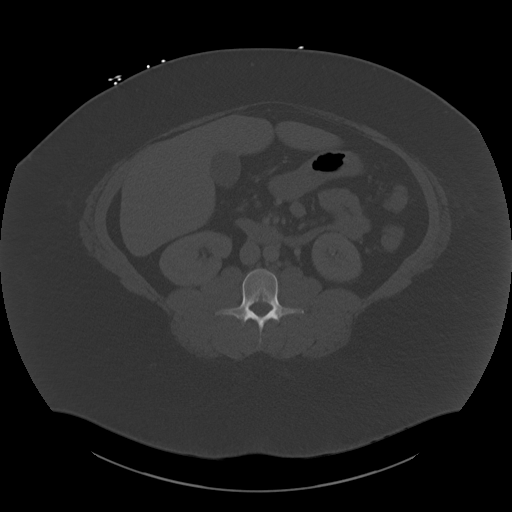
[im 58/89  soft-tissue]
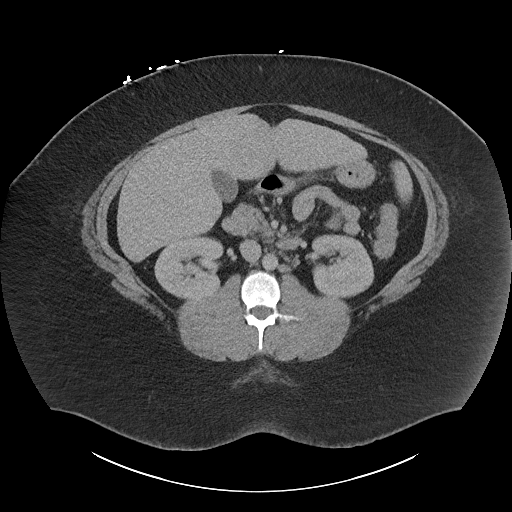
[im 67/89  soft-tissue]
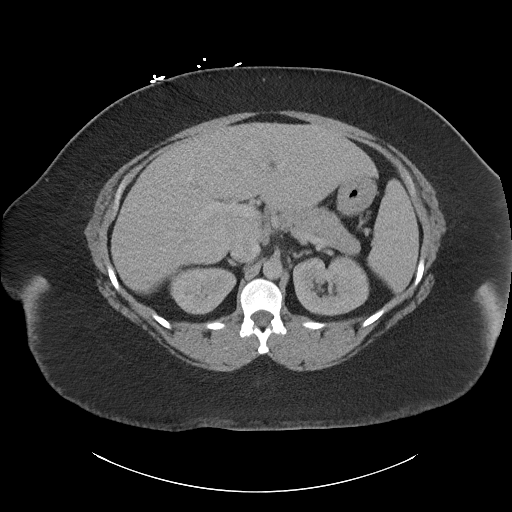
[im 71/89  soft-tissue]
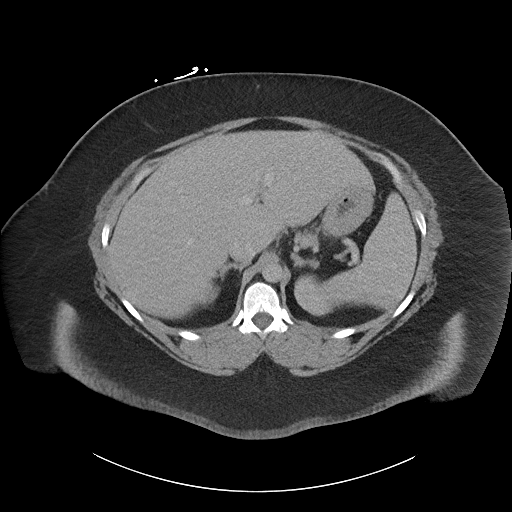
[im 75/89  soft-tissue]
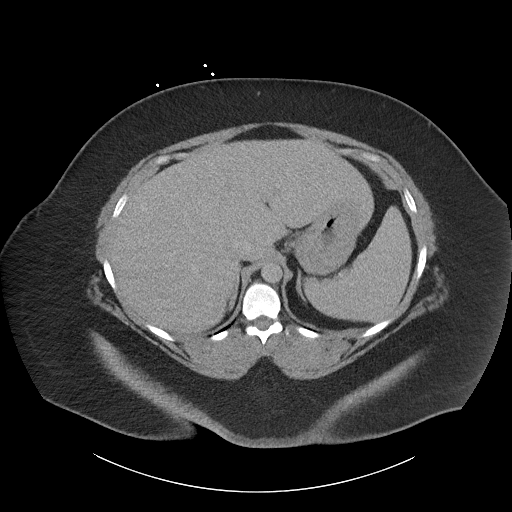
[im 84/89  soft-tissue]
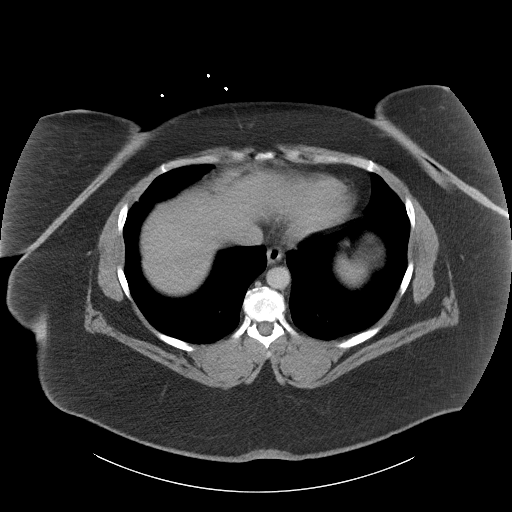

[Series 6: abdomen 3.0 mpr cor · coronal · 0.90mm/px · 3 of 102 slices shown]
[im 34/102  soft-tissue]
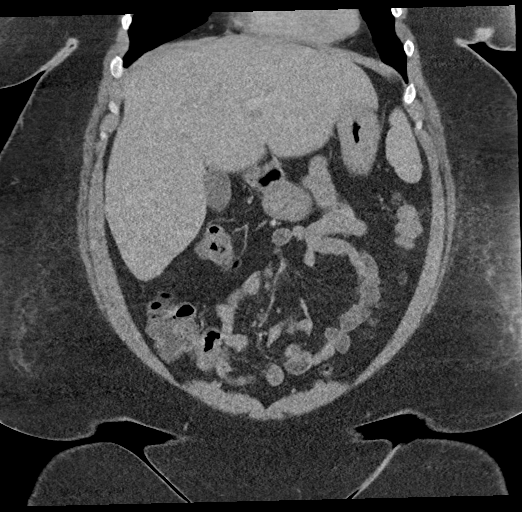
[im 45/102  soft-tissue]
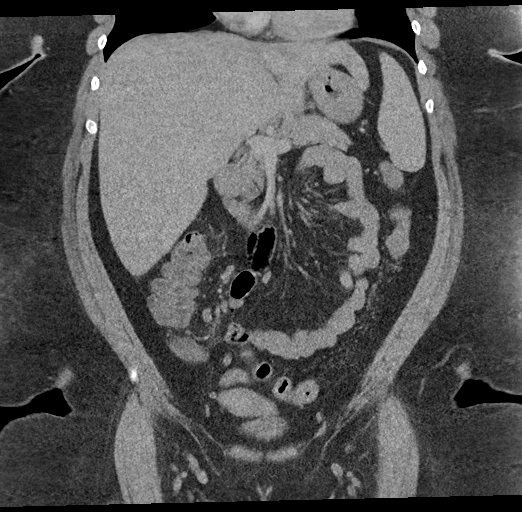
[im 57/102  soft-tissue]
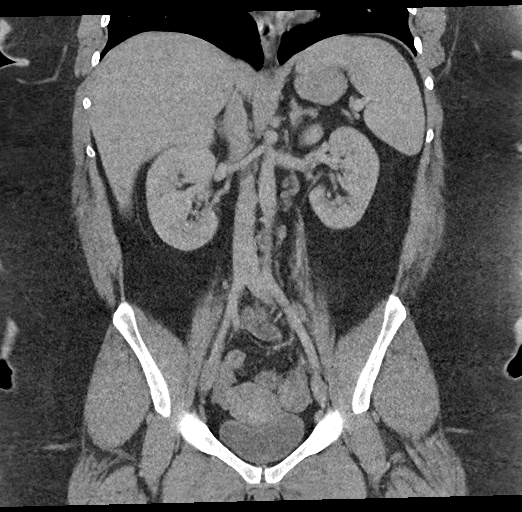

[17 of 46 positions shown; findings below may reference images not displayed]

FINDINGS: Lower chest: No acute abnormality.

Hepatobiliary: No focal liver abnormality is seen. Stable
hepatomegaly is noted. No gallstones, gallbladder wall thickening,
or biliary dilatation.

Pancreas: Unremarkable. No pancreatic ductal dilatation or
surrounding inflammatory changes.

Spleen: Normal in size without focal abnormality.

Adrenals/Urinary Tract: Adrenal glands are within normal limits.
Kidneys are well visualized bilaterally. Tiny nonobstructing stones
are seen bilaterally left greater than right. Collecting system and
ureters are within normal limits. Bladder is partially distended. No
obstructive changes are seen.

Stomach/Bowel: The appendix is well visualized and within normal
limits. No obstructive or inflammatory changes of the colon are
seen. Stomach and small bowel are within normal limits.

Vascular/Lymphatic: No significant vascular findings are present. No
enlarged abdominal or pelvic lymph nodes.

Reproductive: Uterus and bilateral adnexa are unremarkable.

Other: Stable small fat containing umbilical and inguinal hernias.

Musculoskeletal: No acute or significant osseous findings.
IMPRESSION: Tiny nonobstructing stones bilaterally.

Stable hepatomegaly

Stable fat containing small umbilical and inguinal hernias.

No acute abnormality noted.

## 2022-01-16 ENCOUNTER — Other Ambulatory Visit: Payer: Self-pay | Admitting: Nurse Practitioner

## 2022-01-16 DIAGNOSIS — E119 Type 2 diabetes mellitus without complications: Secondary | ICD-10-CM

## 2022-01-17 ENCOUNTER — Encounter: Payer: Self-pay | Admitting: Nurse Practitioner

## 2022-01-21 ENCOUNTER — Telehealth: Payer: Self-pay

## 2022-01-21 DIAGNOSIS — Z0000 Encounter for general adult medical examination without abnormal findings: Secondary | ICD-10-CM

## 2022-01-21 NOTE — Telephone Encounter (Signed)
Called patient to schedule 1-monthf/u with health coaching. Patient requested to be called at 12:30pm on 7/21. Patient has been scheduled as requested and will be called on that date and time.    Emily Phelps LTruman Hayward CNew York Presbyterian Hospital - Allen HospitalCMayo Clinic Health System In Red WingGuide, Health Coach [address] [id number]: [phone]: [email or web address]

## 2022-01-31 ENCOUNTER — Ambulatory Visit: Payer: BC Managed Care – PPO

## 2022-01-31 DIAGNOSIS — Z0000 Encounter for general adult medical examination without abnormal findings: Secondary | ICD-10-CM

## 2022-01-31 NOTE — Progress Notes (Signed)
Appointment Outcome: Completed, Session #: 6-monthf/u Start time: 12:31pm   End time: 1:07pm   Total Mins: 36 minutes  AGREEMENTS SECTION    Overall goal(s): Increase physical activity Maintain healthy eating habits Stress management                                           Agreement/Action Steps: Increasing physical activity Walk 7,500 steps per day Track steps using iPhone   Maintaining healthy eating habits [facility] calories/daily Speak to provider about progress with diet Maintain consumption of 84-100 oz of water/fluid per day Limit fast food consumption by cooking at home Monitor sodium intake by reading food labels (?1,500 mg of sodium/day) Practice portion control   Stress management Utilize support system Maintain healthy boundaries Incorporate positive affirmations and positive self-talk Deep breathing Take an emotional break when needed     Progress Notes:  Patient stated that she typically walks approximately 6,000 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 Patient was asked what she could do to work on increasing her physical activity on her days off. Patient stated that she could walk to increase her steps but on her days off she tries to rest because she is mentally and physically tired.   Patient stated that she needs mental breaks and there have been times when she disconnects from her phone and limit her responses. Patient stated that she has had a therapy session since the passing of her sister in May. Patient mentioned that she has had to keep pushing through each day and not think about what she has gone through. Patient shared that her family is her support system, and they have conversations about their feelings and making plans for various things that needed to be resolved.   Patient shared that  she [facility] Patient stated that she has cut back on eating out as much, especially [employer], which she used to eat at least twice per week. Patient stated that she had stopped drinking soda but have since resumed along with drinking sparkling water due to needing to burp at times. Patient stated that she drinks zero-sugar Pepsi. Patient stated that she sometimes drinks Gatorade, but mainly drinks 84 oz of water daily.   Patient shared that she was aiming for 100 oz of water daily but struggle with being consistent. Patient mentioned that she had some swelling and was told by a provider that she needs to increase her water intake to consume at least half of her body water in ounces. Patient stated that she has not been able to meet that goal because she was also told to consume between 80-90 oz of protein daily. Patient shared that if she drinks more than 84 oz of water per day, she cannot reach her protein goal.   Patient stated that she is practicing portion control by eyeballing her servings. Patient mentioned that she shops at places where the meat portions are smaller than the size of the palm of your hand, such as pork chops and chicken wings. Patient stated that she would have to eat two pork chops to make up a typically serving. Patient reported that she  separates her meals and package her food. Patient stated that she does not use salt while cooking. Patient mentioned that she used garlic and onion powder along with peppers.   Patient stated that she is not reading food labels to monitor sodium intake because she eats the same things regularly. Patient mentioned that she logs her food for [facility], so she gets a breakdown of how much sodium she consumes per day. Patient shared that she has been focused more on calories and carbohydrate consumption. Patient stated that she is trying to be mindful of what she eats and have been eating more green  vegetables. Patient reported that despite her efforts, she struggles with losing and gaining the same 10 lbs.     Indicators of Success and Accountability:  Patient stated that she has been able to maintain while dealing with multiple stressors. Readiness: Patient is in the action phase of stress management, increasing physical activity, and improving healthy eating habits.  Strengths and Supports: Patient is being supported by a therapist and her family. Patient feels perseverance has been her strength to continue to do deal with stress and manage her day to day. Challenges and Barriers: Patient is experiencing mental and physical burnout, which is a challenge/barrier to increasing and maintaining her physical activity consistently.     Coaching Outcomes: Patient did not want to make additional changes to her action steps outside the consideration of walking on her days off if possible.   Discussed with patient how she could potentially arrange her off days to include walking to work towards increasing her steps from 2,000 steps on those days.  Will call patient to schedule final 49-monthf/u session over the phone set around August 04, 2022, when schedule becomes available.    Attempted: Fulfilled - Patient has been able [facility] maintain consumption of 84 oz of water per day, limit fast food consumption, and practice portion control. Patient is engaging with her support system and maintain healthy boundaries. Patient incorporates positive self-talk, deep breathing, and taking an emotional break.  Not met - Patient was not able to reach walking 7,500 steps per day. Patient is not reading food labels to monitor her sodium intake.

## 2022-02-06 ENCOUNTER — Other Ambulatory Visit: Payer: Self-pay | Admitting: Nurse Practitioner

## 2022-03-06 ENCOUNTER — Encounter: Payer: Self-pay | Admitting: Advanced Practice Midwife

## 2022-03-06 ENCOUNTER — Ambulatory Visit: Payer: Self-pay | Admitting: Advanced Practice Midwife

## 2022-03-06 ENCOUNTER — Ambulatory Visit (HOSPITAL_BASED_OUTPATIENT_CLINIC_OR_DEPARTMENT_OTHER): Payer: BC Managed Care – PPO | Admitting: Cardiovascular Disease

## 2022-03-06 DIAGNOSIS — Z113 Encounter for screening for infections with a predominantly sexual mode of transmission: Secondary | ICD-10-CM

## 2022-03-06 LAB — WET PREP FOR TRICH, YEAST, CLUE
Trichomonas Exam: NEGATIVE
Yeast Exam: NEGATIVE

## 2022-03-06 LAB — HM HIV SCREENING LAB: HM HIV Screening: NEGATIVE

## 2022-03-06 LAB — HM HEPATITIS C SCREENING LAB: HM Hepatitis Screen: NEGATIVE

## 2022-03-06 NOTE — Progress Notes (Signed)
WET PREP negative and no treatment indicated in clinic today. Pt advised to try non-latex condoms (given) and per E. Sciora, pt should try eating one low-sugar yogurt daily. Pt verbalized understanding of further labwork pending.  [place] RN

## 2022-03-06 NOTE — Progress Notes (Signed)
[facility] Department  STI clinic/screening visit [place] [address] [phone]  Subjective:  Emily Phelps is a 41 y.o. SBF nonsmoker nullip female being seen today for an STI screening visit. The patient reports they do have symptoms.  Patient reports that they do not desire a pregnancy in the next year.   They reported they are not interested in discussing contraception today.    No LMP recorded. (Menstrual status: Irregular Periods).   Patient has the following medical conditions:   Patient Active Problem List   Diagnosis Date Noted   Hyperparathyroidism, primary ([place]) 11/06/2021   Palpitations 05/13/2021   Family history of hypertrophic cardiomyopathy 05/13/2021   Morbid obesity with BMI of 60.0-69.9, adult ([place]) 380 lbs 01/04/2020   Legionella pneumonia ([place]) 01/03/2020   OSA (obstructive sleep apnea) 10/08/2019   Controlled type 2 diabetes mellitus without complication, with long-term current use of insulin ([place]) 05/20/2019   Numbness and tingling in left hand 05/20/2019   Hypokalemia    Dyspnea 03/11/2019   Panic anxiety syndrome 03/03/2019   Neck pain 01/07/2019   Routine general medical examination at a health care facility 09/24/2018   Dysfunctional uterine bleeding 11/28/2014   Essential hypertension 11/28/2014   GERD (gastroesophageal reflux disease) 11/28/2014   Migraines 11/28/2014   Hyperlipidemia 11/28/2014   Allergic rhinitis 11/28/2014    Chief Complaint  Patient presents with   SEXUALLY TRANSMITTED DISEASE    STI screening. Endorses vaginal discharge and itching.    HPI  Patient reports c/o itching with malodor x couple weeks. Last sex 02/13/22 with condom; with current partner x 2 years; 2 sex partners in last 3 mo. LMP 10/17/21. Last MJ 1 wk ago. Last ETOH yesterday (4 mixed drinks) 3-4x/wk. Diabetic.  Last HIV test per patient/review of record was 01/28/21 Patient reports last pap was 11/20/20 neg HPV  neg  Screening for MPX risk: Does the patient have an unexplained rash? No Is the patient MSM? No Does the patient endorse multiple sex partners or anonymous sex partners? yes Did the patient have close or sexual contact with a person diagnosed with MPX? No Has the patient traveled outside the [place] where MPX is endemic? No Is there a high clinical suspicion for MPX-- evidenced by one of the following No  -Unlikely to be chickenpox  -Lymphadenopathy  -Rash that present in same phase of evolution on any given body part See flowsheet for further details and programmatic requirements.   Immunization history:  Immunization History  Administered Date(s) Administered   Influenza,inj,Quad PF,6+ Mos 06/22/2020, 04/03/2021   MMR 03/06/1988, 01/28/2002   PFIZER(Purple Top)SARS-COV-2 Vaccination 03/30/2020, 04/20/2020, 05/13/2021   Pneumococcal Polysaccharide-23 01/20/2020   Tdap 06/16/2012     The following portions of the patient's history were reviewed and updated as appropriate: allergies, current medications, past medical history, past social history, past surgical history and problem list.  Objective:  There were no vitals filed for this visit.  Physical Exam Vitals and nursing note reviewed.  Constitutional:      Appearance: Normal appearance. She is obese.  HENT:     Head: Normocephalic and atraumatic.     Mouth/Throat:     Mouth: Mucous membranes are moist.     Pharynx: Oropharynx is clear. No oropharyngeal exudate or posterior oropharyngeal erythema.  Eyes:     Conjunctiva/sclera: Conjunctivae normal.  Pulmonary:     Effort: Pulmonary effort is normal.  Abdominal:     Palpations: Abdomen is soft. There is no  mass.     Tenderness: There is no abdominal tenderness. There is no rebound.     Comments: Soft without masses or tenderness, poor tone  Genitourinary:    General: Normal vulva.     Exam position: Lithotomy position.     Pubic Area: No rash or pubic lice.      Labia:         Right: No rash or lesion.        Left: No rash or lesion.      Vagina: Normal. No vaginal discharge, erythema, bleeding or lesions.     Cervix: No cervical motion tenderness, discharge, friability, lesion or erythema.     Uterus: Normal.      Adnexa: Right adnexa normal and left adnexa normal.     Rectum: Normal.     Comments: pH = <4.5 Lymphadenopathy:     Head:     Right side of head: No preauricular or posterior auricular adenopathy.     Left side of head: No preauricular or posterior auricular adenopathy.     Cervical: No cervical adenopathy.     Right cervical: No superficial, deep or posterior cervical adenopathy.    Left cervical: No superficial, deep or posterior cervical adenopathy.     Upper Body:     Right upper body: No supraclavicular, axillary or epitrochlear adenopathy.     Left upper body: No supraclavicular, axillary or epitrochlear adenopathy.     Lower Body: No right inguinal adenopathy. No left inguinal adenopathy.  Skin:    General: Skin is warm and dry.     Findings: No rash.  Neurological:     Mental Status: She is alert and oriented to person, place, and time.      Assessment and Plan:  Emily Phelps is a 41 y.o. female presenting to the [facility] Department for STI screening  1. Screening examination for venereal disease Treat wet mount per standing orders Immunization nurse consult  - WET PREP FOR [place], YEAST, CLUE - Gonococcus culture - Syphilis Serology, [place] [facility] - Chlamydia/Gonorrhea [place] [facility] - HIV/HCV [place] [facility]     No follow-ups on file.  Future Appointments  Date Time Provider [place]  03/06/2022  9:30 AM Skeet Latch, MD [facility]  03/27/2022  4:00 PM [name], FNP [facility] None    Herbie Saxon, CNM
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
## 2022-03-12 LAB — GONOCOCCUS CULTURE

## 2022-03-19 ENCOUNTER — Emergency Department (HOSPITAL_COMMUNITY): Payer: BC Managed Care – PPO

## 2022-03-19 ENCOUNTER — Emergency Department (HOSPITAL_COMMUNITY)
Admission: EM | Admit: 2022-03-19 | Discharge: 2022-03-20 | Disposition: A | Discharge status: Home / Self Care | Payer: BC Managed Care – PPO | Attending: Emergency Medicine | Admitting: Emergency Medicine

## 2022-03-19 ENCOUNTER — Encounter (HOSPITAL_COMMUNITY): Payer: Self-pay | Admitting: Emergency Medicine

## 2022-03-19 DIAGNOSIS — R0789 Other chest pain: Secondary | ICD-10-CM | POA: Insufficient documentation

## 2022-03-19 DIAGNOSIS — N9489 Other specified conditions associated with female genital organs and menstrual cycle: Secondary | ICD-10-CM | POA: Diagnosis not present

## 2022-03-19 DIAGNOSIS — Z79899 Other long term (current) drug therapy: Secondary | ICD-10-CM | POA: Insufficient documentation

## 2022-03-19 DIAGNOSIS — E119 Type 2 diabetes mellitus without complications: Secondary | ICD-10-CM | POA: Insufficient documentation

## 2022-03-19 DIAGNOSIS — I10 Essential (primary) hypertension: Secondary | ICD-10-CM | POA: Insufficient documentation

## 2022-03-19 DIAGNOSIS — Z20822 Contact with and (suspected) exposure to covid-19: Secondary | ICD-10-CM | POA: Diagnosis not present

## 2022-03-19 DIAGNOSIS — Z794 Long term (current) use of insulin: Secondary | ICD-10-CM | POA: Insufficient documentation

## 2022-03-19 DIAGNOSIS — N200 Calculus of kidney: Secondary | ICD-10-CM | POA: Insufficient documentation

## 2022-03-19 DIAGNOSIS — Z7984 Long term (current) use of oral hypoglycemic drugs: Secondary | ICD-10-CM | POA: Diagnosis not present

## 2022-03-19 DIAGNOSIS — R1013 Epigastric pain: Secondary | ICD-10-CM | POA: Diagnosis present

## 2022-03-19 LAB — CBC
HCT: 38.2 % (ref 36.0–46.0)
Hemoglobin: 12.3 g/dL (ref 12.0–15.0)
MCH: 25.2 pg — ABNORMAL LOW (ref 26.0–34.0)
MCHC: 32.2 g/dL (ref 30.0–36.0)
MCV: 78.3 fL — ABNORMAL LOW (ref 80.0–100.0)
Platelets: 398 10*3/uL (ref 150–400)
RBC: 4.88 MIL/uL (ref 3.87–5.11)
RDW: 18.7 % — ABNORMAL HIGH (ref 11.5–15.5)
WBC: 12.2 10*3/uL — ABNORMAL HIGH (ref 4.0–10.5)
nRBC: 0 % (ref 0.0–0.2)

## 2022-03-19 MED ORDER — ONDANSETRON 4 MG PO TBDP
4.0000 mg | ORAL_TABLET | Freq: Once | ORAL | Status: AC
  Administered 2022-03-19: 4 mg via ORAL
  Filled 2022-03-19: qty 1

## 2022-03-19 NOTE — ED Provider Triage Note (Signed)
Emergency Medicine Provider Triage Evaluation Note  Emily Phelps , a 41 y.o. female  was evaluated in triage.  Pt complains of chest pain and abdominal pain since last night. Took tylenol which helped temporarily but has felt unwell since waking up today. Vomited x5. CP is left sided non-radiating.   No SOB. No LH or dizziness.   No cough, congestion.   Review of Systems  Positive: CP, Abd pain Negative: Fever   Physical Exam  BP (!) 150/93   Pulse 70   Temp 98 F (36.7 C) (Oral)   Resp 20   Ht '5\' 4"'$  (1.626 m)   Wt (!) 172 kg   SpO2 100%   BMI 65.09 kg/m  Gen:   Awake, no distress   Resp:  Normal effort  MSK:   Moves extremities without difficulty Other:  Obese abd. Diffuse non-focal ttp, soft abd. Limited by sitting in wheelchair   Medical Decision Making  Medically screening exam initiated at 4:16 PM.  Appropriate orders placed.  Emily Phelps was informed that the remainder of the evaluation will be completed by another provider, this initial triage assessment does not replace that evaluation, and the importance of remaining in the ED until their evaluation is complete.  [address]    Emily Phelps [place], [place] 03/19/22 1620

## 2022-03-19 NOTE — ED Triage Notes (Signed)
Pt endorses back pain and chest pain since last night. Relieved with tylenol last night but worse this morning. Left sided chest pain and into her abd. N/v.
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
## 2022-03-20 ENCOUNTER — Emergency Department (HOSPITAL_COMMUNITY): Payer: BC Managed Care – PPO

## 2022-03-20 LAB — TROPONIN I (HIGH SENSITIVITY)
Troponin I (High Sensitivity): 3 ng/L (ref <18)
Troponin I (High Sensitivity): 4 ng/L (ref <18)

## 2022-03-20 LAB — RAPID URINE DRUG SCREEN, HOSP PERFORMED
Amphetamines: NOT DETECTED
Barbiturates: NOT DETECTED
Benzodiazepines: NOT DETECTED
Cocaine: NOT DETECTED
Opiates: NOT DETECTED
Tetrahydrocannabinol: NOT DETECTED

## 2022-03-20 LAB — URINALYSIS, ROUTINE W REFLEX MICROSCOPIC
Bacteria, UA: NONE SEEN
Bilirubin Urine: NEGATIVE
Glucose, UA: >=500 mg/dL — AB
Ketones, ur: NEGATIVE mg/dL
Leukocytes,Ua: NEGATIVE
Nitrite: NEGATIVE
Protein, ur: NEGATIVE mg/dL
Specific Gravity, Urine: 1.015 (ref 1.005–1.030)
pH: 7 (ref 5.0–8.0)

## 2022-03-20 LAB — COMPREHENSIVE METABOLIC PANEL
ALT: 22 U/L (ref 0–44)
AST: 22 U/L (ref 15–41)
Albumin: 3.8 g/dL (ref 3.5–5.0)
Alkaline Phosphatase: 77 U/L (ref 38–126)
Anion gap: 12 (ref 5–15)
BUN: 12 mg/dL (ref 6–20)
CO2: 22 mmol/L (ref 22–32)
Calcium: 9.4 mg/dL (ref 8.9–10.3)
Chloride: 105 mmol/L (ref 98–111)
Creatinine, Ser: 1.07 mg/dL — ABNORMAL HIGH (ref 0.44–1.00)
GFR, Estimated: >60 mL/min (ref >60)
Glucose, Bld: 112 mg/dL — ABNORMAL HIGH (ref 70–99)
Potassium: 3.9 mmol/L (ref 3.5–5.1)
Sodium: 139 mmol/L (ref 135–145)
Total Bilirubin: 0.7 mg/dL (ref 0.3–1.2)
Total Protein: 7.3 g/dL (ref 6.5–8.1)

## 2022-03-20 LAB — I-STAT BETA HCG BLOOD, ED (MC, WL, AP ONLY): I-stat hCG, quantitative: <5 m[IU]/mL (ref <5)

## 2022-03-20 LAB — RESP PANEL BY RT-PCR (FLU A&B, COVID) ARPGX2
Influenza A by PCR: NEGATIVE
Influenza B by PCR: NEGATIVE
SARS Coronavirus 2 by RT PCR: NEGATIVE

## 2022-03-20 LAB — LIPASE, BLOOD: Lipase: 25 U/L (ref 11–51)

## 2022-03-20 MED ORDER — HYDROCODONE-ACETAMINOPHEN 5-325 MG PO TABS: 1.0000 | ORAL_TABLET | ORAL | 0 refills | Status: DC | PRN

## 2022-03-20 MED ORDER — ONDANSETRON HCL 4 MG PO TABS: 4.0000 mg | ORAL_TABLET | ORAL | 0 refills | Status: AC | PRN

## 2022-03-20 MED ORDER — ALUM & MAG HYDROXIDE-SIMETH 200-200-20 MG/5ML PO SUSP
30.0000 mL | Freq: Once | ORAL | Status: AC
  Administered 2022-03-20: 30 mL via ORAL
  Filled 2022-03-20: qty 30

## 2022-03-20 MED ORDER — HYDROMORPHONE HCL 1 MG/ML IJ SOLN: 1.0000 mg | Freq: Once | INTRAMUSCULAR | Status: DC

## 2022-03-20 MED ORDER — DICYCLOMINE HCL 10 MG PO CAPS
10.0000 mg | ORAL_CAPSULE | Freq: Once | ORAL | Status: AC
  Administered 2022-03-20: 10 mg via ORAL
  Filled 2022-03-20: qty 1

## 2022-03-20 MED ORDER — KETOROLAC TROMETHAMINE 30 MG/ML IJ SOLN
30.0000 mg | Freq: Once | INTRAMUSCULAR | Status: AC
  Administered 2022-03-20: 30 mg via INTRAVENOUS
  Filled 2022-03-20: qty 1

## 2022-03-20 MED ORDER — ONDANSETRON HCL 4 MG/2ML IJ SOLN
4.0000 mg | Freq: Once | INTRAMUSCULAR | Status: AC
  Administered 2022-03-20: 4 mg via INTRAVENOUS
  Filled 2022-03-20: qty 2

## 2022-03-20 MED ORDER — IOHEXOL 300 MG/ML  SOLN
100.0000 mL | Freq: Once | INTRAMUSCULAR | Status: AC | PRN
  Administered 2022-03-20: 100 mL via INTRAVENOUS

## 2022-03-20 MED ORDER — SUCRALFATE 1 G PO TABS: 1.0000 g | ORAL_TABLET | Freq: Three times a day (TID) | ORAL | 0 refills | Status: DC

## 2022-03-20 MED ORDER — SODIUM CHLORIDE 0.9 % IV BOLUS
1000.0000 mL | Freq: Once | INTRAVENOUS | Status: AC
  Administered 2022-03-20: 1000 mL via INTRAVENOUS

## 2022-03-20 MED ORDER — HYDROCODONE-ACETAMINOPHEN 5-325 MG PO TABS
1.0000 | ORAL_TABLET | Freq: Once | ORAL | Status: AC
  Administered 2022-03-20: 1 via ORAL
  Filled 2022-03-20: qty 1

## 2022-03-20 NOTE — Discharge Instructions (Addendum)
It was a pleasure caring for you today in the emergency department. ° °Please return to the emergency department for any worsening or worrisome symptoms. ° ° °

## 2022-03-20 NOTE — ED Notes (Signed)
Water given  

## 2022-03-24 ENCOUNTER — Other Ambulatory Visit: Payer: Self-pay | Admitting: Cardiovascular Disease

## 2022-03-24 ENCOUNTER — Other Ambulatory Visit: Payer: Self-pay | Admitting: Nurse Practitioner

## 2022-03-27 ENCOUNTER — Ambulatory Visit: Payer: BC Managed Care – PPO | Admitting: Nurse Practitioner

## 2022-04-07 ENCOUNTER — Other Ambulatory Visit: Payer: Self-pay | Admitting: Nurse Practitioner

## 2022-04-09 ENCOUNTER — Encounter: Payer: Self-pay | Admitting: Nurse Practitioner

## 2022-04-09 ENCOUNTER — Ambulatory Visit (INDEPENDENT_AMBULATORY_CARE_PROVIDER_SITE_OTHER): Payer: BC Managed Care – PPO | Admitting: Nurse Practitioner

## 2022-04-09 VITALS — BP 130/72 | HR 88 | Temp 98.1°F | Ht 64.0 in | Wt 387.6 lb

## 2022-04-09 DIAGNOSIS — Z6841 Body Mass Index (BMI) 40.0 and over, adult: Secondary | ICD-10-CM

## 2022-04-09 DIAGNOSIS — I152 Hypertension secondary to endocrine disorders: Secondary | ICD-10-CM | POA: Diagnosis not present

## 2022-04-09 DIAGNOSIS — E1169 Type 2 diabetes mellitus with other specified complication: Secondary | ICD-10-CM

## 2022-04-09 DIAGNOSIS — E669 Obesity, unspecified: Secondary | ICD-10-CM

## 2022-04-09 DIAGNOSIS — Z23 Encounter for immunization: Secondary | ICD-10-CM

## 2022-04-09 DIAGNOSIS — E1159 Type 2 diabetes mellitus with other circulatory complications: Secondary | ICD-10-CM

## 2022-04-09 DIAGNOSIS — I10 Essential (primary) hypertension: Secondary | ICD-10-CM

## 2022-04-09 DIAGNOSIS — E119 Type 2 diabetes mellitus without complications: Secondary | ICD-10-CM

## 2022-04-09 NOTE — Patient Instructions (Addendum)
Hypertension, Adult Hypertension is another name for high blood pressure. High blood pressure forces your heart to work harder to pump blood. This can cause problems over time. There are two numbers in a blood pressure reading. There is a top number (systolic) over a bottom number (diastolic). It is best to have a blood pressure that is below 120/80. What are the causes? The cause of this condition is not known. Some other conditions can lead to high blood pressure. What increases the risk? Some lifestyle factors can make you more likely to develop high blood pressure: Smoking. Not getting enough exercise or physical activity. Being overweight. Having too much fat, sugar, calories, or salt (sodium) in your diet. Drinking too much alcohol. Other risk factors include: Having any of these conditions: Heart disease. Diabetes. High cholesterol. Kidney disease. Obstructive sleep apnea. Having a family history of high blood pressure and high cholesterol. Age. The risk increases with age. Stress. What are the signs or symptoms? High blood pressure may not cause symptoms. Very high blood pressure (hypertensive crisis) may cause: Headache. Fast or uneven heartbeats (palpitations). Shortness of breath. Nosebleed. Vomiting or feeling like you may vomit (nauseous). Changes in how you see. Very bad chest pain. Feeling dizzy. Seizures. How is this treated? This condition is treated by making healthy lifestyle changes, such as: Eating healthy foods. Exercising more. Drinking less alcohol. Your doctor may prescribe medicine if lifestyle changes do not help enough and if: Your top number is above 130. Your bottom number is above 80. Your personal target blood pressure may vary. Follow these instructions at home: Eating and drinking  If told, follow the DASH eating plan. To follow this plan: Fill one half of your plate at each meal with fruits and vegetables. Fill one fourth of your plate  at each meal with whole grains. Whole grains include whole-wheat pasta, brown rice, and whole-grain bread. Eat or drink low-fat dairy products, such as skim milk or low-fat yogurt. Fill one fourth of your plate at each meal with low-fat (lean) proteins. Low-fat proteins include fish, chicken without skin, eggs, beans, and tofu. Avoid fatty meat, cured and processed meat, or chicken with skin. Avoid pre-made or processed food. Limit the amount of salt in your diet to less than 1,500 mg each day. Do not drink alcohol if: Your doctor tells you not to drink. You are pregnant, may be pregnant, or are planning to become pregnant. If you drink alcohol: Limit how much you have to: 0-1 drink a day for women. 0-2 drinks a day for men. Know how much alcohol is in your drink. In the [place] one drink equals one 12 oz bottle of beer (355 mL), one 5 oz glass of wine (148 mL), or one 1 oz glass of hard liquor (44 mL). Lifestyle  Work with your doctor to stay at a healthy weight or to lose weight. Ask your doctor what the best weight is for you. Get at least 30 minutes of exercise that causes your heart to beat faster (aerobic exercise) most days of the week. This may include walking, swimming, or biking. Get at least 30 minutes of exercise that strengthens your muscles (resistance exercise) at least 3 days a week. This may include lifting weights or doing Pilates. Do not smoke or use any products that contain nicotine or tobacco. If you need help quitting, ask your doctor. Check your blood pressure at home as told by your doctor. Keep all follow-up visits. Medicines Take over-the-counter and prescription medicines  only as told by your doctor. Follow directions carefully. Do not skip doses of blood pressure medicine. The medicine does not work as well if you skip doses. Skipping doses also puts you at risk for problems. Ask your doctor about side effects or reactions to medicines that you should watch  for. Contact a doctor if: You think you are having a reaction to the medicine you are taking. You have headaches that keep coming back. You feel dizzy. You have swelling in your ankles. You have trouble with your vision. Get help right away if: You get a very bad headache. You start to feel mixed up (confused). You feel weak or numb. You feel faint. You have very bad pain in your: Chest. Belly (abdomen). You vomit more than once. You have trouble breathing. These symptoms may be an emergency. Get help right away. Call 911. Do not wait to see if the symptoms will go away. Do not drive yourself to the hospital. Summary Hypertension is another name for high blood pressure. High blood pressure forces your heart to work harder to pump blood. For most people, a normal blood pressure is less than 120/80. Making healthy choices can help lower blood pressure. If your blood pressure does not get lower with healthy choices, you may need to take medicine. This information is not intended to replace advice given to you by your health care provider. Make sure you discuss any questions you have with your health care provider. Document Revised: 04/18/2021 Document Reviewed: 04/18/2021 Elsevier Patient Education  [place]  - [phone]

## 2022-04-09 NOTE — Progress Notes (Signed)
Emily Phelps,acting as a Education administrator for [name], FNP.,have documented all relevant documentation on the behalf of [name], FNP,as directed by  [name], FNP while in the presence of [name], [place].    Subjective:     Patient ID: Emily Phelps , female    DOB: 1981-01-13 , 41 y.o.   MRN: [id number]   Chief Complaint  Patient presents with   Hypertension   Follow-up    ER    HPI  Patient presents today for an ER follow up and bp check. Patient was admitted on 03/19/2022 she states she was passing kidney stones that were really painful. She has 2 (size approximately 41m provider does not feel will pass). remaining and she is to follow up with urology in 6 months for repeat scan. Patient states she is feeling a lot better her symptoms lasted about 2 weeks. She feels "a lot better). She was checked for dehydration and was told she was by the urologist.   BP Readings from Last 3 Encounters:  04/09/22 : 130/72 03/20/22 : (!) 140/80 12/20/21 : 137/83   Wt Readings from Last 3 Encounters: 04/09/22 : (!) 387 lb 9.6 oz (175.8 kg) 03/19/22 : (!) 379 lb 3.1 oz (172 kg) 12/20/21 : (!) 380 lb (172.4 kg)  She continues with [employer]. She has also done Verta with her job and was doing Keto but her cholesterol levels increased. She has spoke with the Bariatric surgeons that was affiliated with Dr. RBlenda Mountsoffice.       Past Medical History:  Diagnosis Date   Allergy    Diabetes mellitus ([place]    Family history of adverse reaction to anesthesia    mother had n/v after    Family history of hypertrophic cardiomyopathy 05/13/2021   GERD (gastroesophageal reflux disease)    Hyperlipidemia    Hypertension    Migraine    Obesity    Palpitations 05/13/2021   Pneumonia    Rectal bleeding    Resistant hypertension 11/28/2014   Sleep apnea    Wears contact lenses      Family History  Problem Relation Age of Onset   Hypertension Mother    Colon  polyps Mother    Diabetes Father    Hypertension Father    Prostate cancer Father    Hypertension Brother    Thyroid disease Maternal Aunt    Heart disease Maternal Uncle    Kidney failure Maternal Uncle    Hypertension Maternal Grandmother    Heart disease Maternal Grandmother    Hypertrophic cardiomyopathy Maternal Grandmother    Colon cancer Neg Hx    Esophageal cancer Neg Hx    Liver cancer Neg Hx    Stomach cancer Neg Hx    Rectal cancer Neg Hx      Current Outpatient Medications:    albuterol (VENTOLIN HFA) 108 (90 Base) MCG/ACT inhaler, Inhale 2 puffs into the lungs every 6 (six) hours as needed for wheezing or shortness of breath., Disp: 6.7 g, Rfl: 0   amLODipine (NORVASC) 10 MG tablet, Take 1 tablet (10 mg total) by mouth daily., Disp: 90 tablet, Rfl: 3   Continuous Blood Gluc Receiver ([place] DEVI, Use to check blood sugars dx code e11.65, Disp: 3 each, Rfl: 3   Continuous Blood Gluc Sensor (DEXCOM G6 SENSOR) MISC, USE AS DIRECTED CHANGE EVERY 10 DAYS, Disp: 3 each, Rfl: 1   dapagliflozin propanediol (FARXIGA) 5 MG TABS tablet, Take 1 tablet (  5 mg total) by mouth daily., Disp: 90 tablet, Rfl: 1   dicyclomine (BENTYL) 10 MG capsule, TAKE 1 CAPSULE BY MOUTH 4 TIMES DAILY AS NEEDED FOR SPASMS, Disp: 30 capsule, Rfl: 2   fexofenadine (ALLEGRA) 180 MG tablet, TAKE 1 TABLET BY MOUTH EVERY DAY, Disp: 30 tablet, Rfl: 1   ibuprofen (ADVIL) 200 MG tablet, Take 200 mg by mouth every 6 (six) hours as needed for moderate pain., Disp: , Rfl:    medroxyPROGESTERone (PROVERA) 10 MG tablet, Take 1 tablet by mouth daily., Disp: , Rfl:    Multiple Vitamin (MULTIVITAMIN WITH MINERALS) TABS tablet, Take 1 tablet by mouth daily., Disp: , Rfl:    omeprazole (PRILOSEC) 40 MG capsule, TAKE 1 CAPSULE BY MOUTH 2 TIMES DAILY (Patient taking differently: Take 40 mg by mouth 2 (two) times daily.), Disp: 180 capsule, Rfl: 3   ondansetron (ZOFRAN ODT) 4 MG disintegrating tablet, Take 1 tablet  (4 mg total) by mouth every 8 (eight) hours as needed for nausea or vomiting., Disp: 20 tablet, Rfl: 0   ondansetron (ZOFRAN) 4 MG tablet, Take 1 tablet (4 mg total) by mouth every 4 (four) hours as needed for nausea or vomiting., Disp: 6 tablet, Rfl: 0   OZEMPIC, 2 MG/DOSE, 8 MG/3ML SOPN, Inject 2 mg into the skin once a week., Disp: 9 mL, Rfl: 0   polyethylene glycol powder (MIRALAX) 17 GM/SCOOP powder, Please take 1 capful dissolved in 8 oz of water prn constipation (Patient taking differently: Take 17 g by mouth daily as needed for mild constipation.), Disp: 238 g, Rfl: 1   spironolactone (ALDACTONE) 50 MG tablet, TAKE 1 TABLET BY MOUTH EVERY DAY, Disp: 90 tablet, Rfl: 1   sucralfate (CARAFATE) 1 g tablet, Take 1 tablet (1 g total) by mouth with breakfast, with lunch, and with evening meal for 7 days., Disp: 21 tablet, Rfl: 0   SUMAtriptan (IMITREX) 50 MG tablet, TAKE 1 TABLET BY MOUTH EVERY 2 HOURS AS NEEDED FOR migraine, may repeat in 2 hours if headache persists or recurs (Patient taking differently: Take 50 mg by mouth every 2 (two) hours as needed for migraine.), Disp: 10 tablet, Rfl: 1   topiramate (TOPAMAX) 50 MG tablet, TAKE 1 TABLET BY MOUTH 2 TIMES DAILY, Disp: 180 tablet, Rfl: 1   traMADol (ULTRAM) 50 MG tablet, Take 1-2 tablets (50-100 mg total) by mouth every 6 (six) hours as needed for moderate pain., Disp: 15 tablet, Rfl: 0   valsartan (DIOVAN) 320 MG tablet, Take 1 tablet (320 mg total) by mouth daily., Disp: 90 tablet, Rfl: 3   VITAMIN D PO, Take 1,000 Units by mouth daily., Disp: , Rfl:    atenolol (TENORMIN) 100 MG tablet, Take 1 tablet (100 mg total) by mouth daily., Disp: 90 tablet, Rfl: 3   Continuous Blood Gluc Transmit (DEXCOM G6 TRANSMITTER) MISC, USE TO CHECK BLOOD SUGAR. CHANGE EVERY 90 DAYS (Patient not taking: Reported on 03/06/2022), Disp: 1 each, Rfl: 3   Cyanocobalamin (VITAMIN B12) 1000 MCG TBCR, Take 1,000 mcg by mouth daily.  (Patient not taking: Reported on  04/09/2022), Disp: , Rfl:    erythromycin ophthalmic ointment, Place 1 application. into the right eye at bedtime. (Patient not taking: Reported on 03/06/2022), Disp: 3.5 g, Rfl: 0   HYDROcodone-acetaminophen (NORCO/VICODIN) 5-325 MG tablet, Take 1 tablet by mouth every 4 (four) hours as needed. (Patient not taking: Reported on 04/09/2022), Disp: 8 tablet, Rfl: 0   zolpidem (AMBIEN) 10 MG tablet, Take 1 tablet (10 mg  total) by mouth at bedtime as needed for sleep. (Patient not taking: Reported on 03/06/2022), Disp: 30 tablet, Rfl: 3   Allergies  Allergen Reactions   Silicone Rash    Other reaction(s): Unknown   Tape Rash    Other reaction(s): Unknown Other reaction(s): Unknown     Review of Systems  Constitutional: Negative.   HENT: Negative.    Eyes: Negative.   Respiratory: Negative.    Cardiovascular: Negative.   Gastrointestinal: Negative.   Psychiatric/Behavioral: Negative.       Today's Vitals   04/09/22 0901  BP: 130/72  Pulse: 88  Temp: 98.1 F (36.7 C)  TempSrc: Oral  Weight: (!) 387 lb 9.6 oz (175.8 kg)  Height: _0  (1.626 m)  PainSc: 0-No pain   Body mass index is 66.53 kg/m.  Wt Readings from Last 3 Encounters:  04/09/22 (!) 387 lb 9.6 oz (175.8 kg)  03/19/22 (!) 379 lb 3.1 oz (172 kg)  12/20/21 (!) 380 lb (172.4 kg)    Objective:  Physical Exam Vitals reviewed.  Constitutional:      General: She is not in acute distress.    Appearance: Normal appearance. She is obese.  Cardiovascular:     Rate and Rhythm: Normal rate and regular rhythm.     Pulses: Normal pulses.     Heart sounds: Normal heart sounds. No murmur heard. Pulmonary:     Effort: Pulmonary effort is normal. No respiratory distress.     Breath sounds: Normal breath sounds. No wheezing.  Skin:    General: Skin is warm and dry.     Capillary Refill: Capillary refill takes less than 2 seconds.     Coloration: Skin is not jaundiced.  Neurological:     General: No focal deficit present.      Mental Status: She is alert and oriented to person, place, and time.     Cranial Nerves: No cranial nerve deficit.     Motor: No weakness.  Psychiatric:        Mood and Affect: Mood normal.        Behavior: Behavior normal.        Thought Content: Thought content normal.        Judgment: Judgment normal.         Assessment And Plan:     1. Obesity, diabetes, and hypertension syndrome ([place]) Comments: Blood pressure is controlled, continue current medications for blood pressure and diabetes - Hemoglobin A1c  2. Essential hypertension - BMP8+eGFR  3. Morbid obesity with BMI of 60.0-69.9, adult (HCC) Chronic Discussed healthy diet and regular exercise options  Encouraged to exercise at least 150 minutes per week with 2 days of strength training  4. Need for influenza vaccination Influenza vaccine administered Encouraged to take Tylenol as needed for fever or muscle aches. - Flu Vaccine QUAD 6+ mos PF IM (Fluarix Quad PF)     Patient was given opportunity to ask questions. Patient verbalized understanding of the plan and was able to repeat key elements of the plan. All questions were answered to their satisfaction.  [name], FNP   I, [name], FNP, have reviewed all documentation for this visit. The documentation on 04/09/22 for the exam, diagnosis, procedures, and orders are all accurate and complete.   IF YOU HAVE BEEN REFERRED TO A SPECIALIST, IT MAY TAKE 1-2 WEEKS TO SCHEDULE/PROCESS THE REFERRAL. IF YOU HAVE NOT HEARD FROM US/SPECIALIST IN TWO WEEKS, PLEASE GIVE [place] A CALL AT [phone] X [phone].  THE PATIENT IS ENCOURAGED TO PRACTICE SOCIAL DISTANCING DUE TO THE COVID-19 PANDEMIC.   

## 2022-04-10 LAB — BMP8+EGFR
BUN/Creatinine Ratio: 17 (ref 9–23)
BUN: 13 mg/dL (ref 6–20)
CO2: 22 mmol/L (ref 20–29)
Calcium: 9.8 mg/dL (ref 8.7–10.2)
Chloride: 101 mmol/L (ref 96–106)
Creatinine, Ser: 0.75 mg/dL (ref 0.57–1.00)
Glucose: 97 mg/dL (ref 70–99)
Potassium: 4.4 mmol/L (ref 3.5–5.2)
Sodium: 137 mmol/L (ref 134–144)
eGFR: 104 mL/min/{1.73_m2} (ref >59)

## 2022-04-10 LAB — HEMOGLOBIN A1C
Est. average glucose Bld gHb Est-mCnc: 100 mg/dL
Hgb A1c MFr Bld: 5.1 % (ref 4.8–5.6)

## 2022-04-16 NOTE — Progress Notes (Signed)
Scheduled patient's 61-monthhealth coaching f/u appointment as informed during 312-month/u appointment.

## 2022-04-17 ENCOUNTER — Other Ambulatory Visit: Payer: Self-pay | Admitting: Nurse Practitioner

## 2022-04-17 DIAGNOSIS — Z794 Long term (current) use of insulin: Secondary | ICD-10-CM

## 2022-04-23 NOTE — Progress Notes (Signed)
04/25/2022 Emily Phelps [id number] 02-16-1981  Referring provider: [name], FNP Primary GI doctor: Dr. Tarri Glenn  ASSESSMENT AND PLAN:  Constipation - Increase fiber/ water intake, decrease caffeine, increase activity level. -Will add on Miralax daily - no obstructive symptoms at this time.  Gastroesophageal reflux disease, unspecified whether esophagitis present Lifestyle changes discussed, avoid NSAIDS Stop ETOH, has two drinks nightly Continue on the same medication, reports symptoms are well controlled.   Morbid obesity with BMI of 60.0-69.9, adult (HCC) 380 lbs Body mass index is 66.77 kg/m.  -Patient has been advised to make an attempt to improve diet and exercise patterns to aid in weight loss. -Recommended diet heavy in fruits and veggies and low in animal meats, cheeses, and dairy products, appropriate calorie intake  Cellulitis and abscess of trunk in diabetic She has been on doxy from telemedicine, states it looks worse than previous No systemtic symptoms but have instructed patient to go to ER for possible culture prior to treatment of ABX, needs close follow up PCP  Fatty liver CT 03/20/2022 with hepatomegaly and splenomegaly Normal platelets, normal LFTs Follow up 6 months, may benefit from liver elastography/MRI/CT due to body habitus.  ETOH cessation  History of Present Illness:  41 y.o. female  with a past medical history of diabetes, hypertension, hyperlipidemia, obesity GERD, gastritis, constipation/IBS, primary parathyroidism status post right inferior parathyroidectomy 11/06/2021 with Dr. Harlow Asa and others listed below, returns to clinic today for evaluation of GERD.  07/11/2019 EGD and colonoscopy EGD normal, colonoscopy unremarkable, no source for rectal bleeding identified, esophageal biopsies showed reflux negative EOE negative H. pylori or metaplasia. 07/18/2020 CT abdomen pelvis with contrast showed nephrolithiasis, stable hepatomegaly,  stable umbilical and inguinal hernias no acute abnormalities.  03/19/2022 ER visit for nephrolithiasis  She states she likes spicy foods but has cut back on her hot sauce, she states her GERD is much better controlled.  She is still on the omeprazole 40 mg twice a day.  She is at [facility] working on weight loss.  She has been on ozempic x 1-2 years.  She denies NSAIDS. She will drink 1-2 glass of vodka/wine for a long time.  She occ has nausea, no vomiting. Has been worse with the kidney stone.  She has occ constipation and will do senna tea, no melena no hematochezia.   She has had infection on her right AB from her dexcom, states she had telephone visit and was started on doxy and had some improvement but it continues.  She states it continues to drainage, some warmth and tenderness.   She  reports that she has never smoked. She has never used smokeless tobacco. She reports current alcohol use of about 4.0 standard drinks of alcohol per week. She reports current drug use. Drug: Marijuana. Her family history includes Colon polyps in her mother; Diabetes in her father; Heart disease in her maternal grandmother and maternal uncle; Hypertension in her brother, father, maternal grandmother, and mother; Hypertrophic cardiomyopathy in her maternal grandmother; Kidney failure in her maternal uncle; Prostate cancer in her father; Thyroid disease in her maternal aunt.   Current Medications:   Current Outpatient Medications (Endocrine & Metabolic):    dapagliflozin propanediol (FARXIGA) 5 MG TABS tablet, Take 1 tablet (5 mg total) by mouth daily.   medroxyPROGESTERone (PROVERA) 10 MG tablet, Take 1 tablet by mouth daily.   OZEMPIC, 2 MG/DOSE, 8 MG/3ML SOPN, INJECT '2mg'$  into THE SKIN ONCE A WEEK  Current Outpatient Medications (Cardiovascular):  atenolol (TENORMIN) 100 MG tablet, Take 1 tablet (100 mg total) by mouth daily.   spironolactone (ALDACTONE) 50 MG tablet, TAKE 1 TABLET BY MOUTH EVERY  DAY   valsartan (DIOVAN) 320 MG tablet, Take 1 tablet (320 mg total) by mouth daily.   amLODipine (NORVASC) 10 MG tablet, Take 1 tablet (10 mg total) by mouth daily.  Current Outpatient Medications (Respiratory):    albuterol (VENTOLIN HFA) 108 (90 Base) MCG/ACT inhaler, Inhale 2 puffs into the lungs every 6 (six) hours as needed for wheezing or shortness of breath.   fexofenadine (ALLEGRA) 180 MG tablet, TAKE 1 TABLET BY MOUTH EVERY DAY  Current Outpatient Medications (Analgesics):    ibuprofen (ADVIL) 200 MG tablet, Take 200 mg by mouth every 6 (six) hours as needed for moderate pain.   SUMAtriptan (IMITREX) 50 MG tablet, TAKE 1 TABLET BY MOUTH EVERY 2 HOURS AS NEEDED FOR migraine, may repeat in 2 hours if headache persists or recurs (Patient taking differently: Take 50 mg by mouth every 2 (two) hours as needed for migraine.)   traMADol (ULTRAM) 50 MG tablet, Take 1-2 tablets (50-100 mg total) by mouth every 6 (six) hours as needed for moderate pain.   HYDROcodone-acetaminophen (NORCO/VICODIN) 5-325 MG tablet, Take 1 tablet by mouth every 4 (four) hours as needed. (Patient not taking: Reported on 04/09/2022)  Current Outpatient Medications (Hematological):    Cyanocobalamin (VITAMIN B12) 1000 MCG TBCR, Take 1,000 mcg by mouth daily.  (Patient not taking: Reported on 04/09/2022)  Current Outpatient Medications (Other):    Continuous Blood Gluc Receiver (DEXCOM G6 RECEIVER) DEVI, Use to check blood sugars dx code e11.65   dicyclomine (BENTYL) 10 MG capsule, TAKE 1 CAPSULE BY MOUTH 4 TIMES DAILY AS NEEDED FOR SPASMS   Multiple Vitamin (MULTIVITAMIN WITH MINERALS) TABS tablet, Take 1 tablet by mouth daily.   omeprazole (PRILOSEC) 40 MG capsule, TAKE 1 CAPSULE BY MOUTH 2 TIMES DAILY (Patient taking differently: Take 40 mg by mouth 2 (two) times daily.)   ondansetron (ZOFRAN ODT) 4 MG disintegrating tablet, Take 1 tablet (4 mg total) by mouth every 8 (eight) hours as needed for nausea or vomiting.    ondansetron (ZOFRAN) 4 MG tablet, Take 1 tablet (4 mg total) by mouth every 4 (four) hours as needed for nausea or vomiting.   topiramate (TOPAMAX) 50 MG tablet, TAKE 1 TABLET BY MOUTH 2 TIMES DAILY   VITAMIN D PO, Take 1,000 Units by mouth daily.   Continuous Blood Gluc Sensor (DEXCOM G6 SENSOR) MISC, USE AS DIRECTED CHANGE EVERY 10 DAYS (Patient not taking: Reported on 04/25/2022)   Continuous Blood Gluc Transmit (DEXCOM G6 TRANSMITTER) MISC, USE TO CHECK BLOOD SUGAR. CHANGE EVERY 90 DAYS (Patient not taking: Reported on 03/06/2022)   doxycycline (MONODOX) 100 MG capsule, Take 100 mg by mouth 2 (two) times daily.   erythromycin ophthalmic ointment, Place 1 application. into the right eye at bedtime. (Patient not taking: Reported on 03/06/2022)   polyethylene glycol powder (MIRALAX) 17 GM/SCOOP powder, Please take 1 capful dissolved in 8 oz of water prn constipation (Patient not taking: Reported on 04/25/2022)   sucralfate (CARAFATE) 1 g tablet, Take 1 tablet (1 g total) by mouth with breakfast, with lunch, and with evening meal for 7 days.   zolpidem (AMBIEN) 10 MG tablet, Take 1 tablet (10 mg total) by mouth at bedtime as needed for sleep. (Patient not taking: Reported on 03/06/2022)  Surgical History:  She  has a past surgical history that includes Fracture surgery; right  hand pin (07/15/2003); Colonoscopy with propofol (N/A, 07/11/2019); Esophagogastroduodenoscopy (egd) with propofol (N/A, 07/11/2019); biopsy (07/11/2019); Colonoscopy; Cervix biopsy; and Parathyroidectomy (Right, 11/06/2021).  Current Medications, Allergies, Past Medical History, Past Surgical History, Family History and Social History were reviewed in [employer] record.  Physical Exam: BP 134/80   Pulse 86   Ht '5\' 4"'$  (1.626 m)   Wt (!) 389 lb (176.4 kg)   SpO2 99%   BMI 66.77 kg/m  General:   Pleasant, obese female in no acute distress Heart : Regular rate and rhythm; no murmurs Pulm: Clear  anteriorly; no wheezing Abdomen:  Soft, Obese AB, Active bowel sounds. Patient with 5 mm wound with purlent drainage, surrounded by warmth and area of 1-2 inches of induration.  Rectal: Not evaluated Extremities:  without  edema. Neurologic:  Alert and  oriented x4;  No focal deficits.  Psych:  Cooperative. Normal mood and affect.    Vladimir Crofts, PA-C 04/25/22

## 2022-04-25 ENCOUNTER — Encounter: Payer: Self-pay | Admitting: Physician Assistant

## 2022-04-25 ENCOUNTER — Other Ambulatory Visit (HOSPITAL_BASED_OUTPATIENT_CLINIC_OR_DEPARTMENT_OTHER): Payer: Self-pay

## 2022-04-25 ENCOUNTER — Other Ambulatory Visit: Payer: Self-pay

## 2022-04-25 ENCOUNTER — Emergency Department (HOSPITAL_BASED_OUTPATIENT_CLINIC_OR_DEPARTMENT_OTHER)
Admission: EM | Admit: 2022-04-25 | Discharge: 2022-04-25 | Disposition: A | Discharge status: Home / Self Care | Payer: BC Managed Care – PPO | Attending: Emergency Medicine | Admitting: Emergency Medicine

## 2022-04-25 ENCOUNTER — Ambulatory Visit (INDEPENDENT_AMBULATORY_CARE_PROVIDER_SITE_OTHER): Payer: BC Managed Care – PPO | Admitting: Physician Assistant

## 2022-04-25 ENCOUNTER — Encounter (HOSPITAL_BASED_OUTPATIENT_CLINIC_OR_DEPARTMENT_OTHER): Payer: Self-pay

## 2022-04-25 VITALS — BP 134/80 | HR 86 | Ht 64.0 in | Wt 389.0 lb

## 2022-04-25 DIAGNOSIS — K5909 Other constipation: Secondary | ICD-10-CM

## 2022-04-25 DIAGNOSIS — Z6841 Body Mass Index (BMI) 40.0 and over, adult: Secondary | ICD-10-CM

## 2022-04-25 DIAGNOSIS — L089 Local infection of the skin and subcutaneous tissue, unspecified: Secondary | ICD-10-CM | POA: Diagnosis present

## 2022-04-25 DIAGNOSIS — L03319 Cellulitis of trunk, unspecified: Secondary | ICD-10-CM

## 2022-04-25 DIAGNOSIS — Z79899 Other long term (current) drug therapy: Secondary | ICD-10-CM | POA: Diagnosis not present

## 2022-04-25 DIAGNOSIS — K760 Fatty (change of) liver, not elsewhere classified: Secondary | ICD-10-CM

## 2022-04-25 DIAGNOSIS — Z7984 Long term (current) use of oral hypoglycemic drugs: Secondary | ICD-10-CM | POA: Diagnosis not present

## 2022-04-25 DIAGNOSIS — Z4800 Encounter for change or removal of nonsurgical wound dressing: Secondary | ICD-10-CM | POA: Insufficient documentation

## 2022-04-25 DIAGNOSIS — E119 Type 2 diabetes mellitus without complications: Secondary | ICD-10-CM | POA: Diagnosis not present

## 2022-04-25 DIAGNOSIS — L02219 Cutaneous abscess of trunk, unspecified: Secondary | ICD-10-CM

## 2022-04-25 DIAGNOSIS — K219 Gastro-esophageal reflux disease without esophagitis: Secondary | ICD-10-CM

## 2022-04-25 LAB — CBC
HCT: 37 % (ref 36.0–46.0)
Hemoglobin: 12.3 g/dL (ref 12.0–15.0)
MCH: 25.8 pg — ABNORMAL LOW (ref 26.0–34.0)
MCHC: 33.2 g/dL (ref 30.0–36.0)
MCV: 77.7 fL — ABNORMAL LOW (ref 80.0–100.0)
Platelets: 486 10*3/uL — ABNORMAL HIGH (ref 150–400)
RBC: 4.76 MIL/uL (ref 3.87–5.11)
RDW: 19.2 % — ABNORMAL HIGH (ref 11.5–15.5)
WBC: 13.6 10*3/uL — ABNORMAL HIGH (ref 4.0–10.5)
nRBC: 0 % (ref 0.0–0.2)

## 2022-04-25 LAB — COMPREHENSIVE METABOLIC PANEL
ALT: 18 U/L (ref 0–44)
AST: 19 U/L (ref 15–41)
Albumin: 4.3 g/dL (ref 3.5–5.0)
Alkaline Phosphatase: 82 U/L (ref 38–126)
Anion gap: 10 (ref 5–15)
BUN: 12 mg/dL (ref 6–20)
CO2: 22 mmol/L (ref 22–32)
Calcium: 10 mg/dL (ref 8.9–10.3)
Chloride: 107 mmol/L (ref 98–111)
Creatinine, Ser: 0.82 mg/dL (ref 0.44–1.00)
GFR, Estimated: >60 mL/min (ref >60)
Glucose, Bld: 119 mg/dL — ABNORMAL HIGH (ref 70–99)
Potassium: 4.7 mmol/L (ref 3.5–5.1)
Sodium: 139 mmol/L (ref 135–145)
Total Bilirubin: 0.5 mg/dL (ref 0.3–1.2)
Total Protein: 8 g/dL (ref 6.5–8.1)

## 2022-04-25 LAB — HCG, SERUM, QUALITATIVE: Preg, Serum: NEGATIVE

## 2022-04-25 MED ORDER — SULFAMETHOXAZOLE-TRIMETHOPRIM 800-160 MG PO TABS
1.0000 | ORAL_TABLET | Freq: Two times a day (BID) | ORAL | 0 refills | Status: AC
Start: 2022-04-25 — End: 2022-05-02

## 2022-04-25 MED ORDER — FLUCONAZOLE 200 MG PO TABS: 200.0000 mg | ORAL_TABLET | Freq: Once | ORAL | 1 refills | Status: DC | PRN

## 2022-04-25 MED ORDER — OMEPRAZOLE 40 MG PO CPDR: 40.0000 mg | DELAYED_RELEASE_CAPSULE | Freq: Two times a day (BID) | ORAL | 3 refills | Status: DC

## 2022-04-25 NOTE — ED Provider Notes (Signed)
[facility] EMERGENCY DEPT Provider Note   CSN: [id number] Arrival date & time: 04/25/22  1154     History  Chief Complaint  Patient presents with   Wound Check    Emily Phelps is a 41 y.o. female presenting for an abdominal wound check.  Reports that she has a history of type 2 diabetes and had a glucose reader in her abdomen.  This was removed a little over a week ago because it was functioning incorrectly.  She then started to develop erythema and warmth to the site.  Was started on doxycycline by PCP.  Today she went and saw her endocrinologist who said that she should come to the hospital for culture and potentially changing her antibiotics.  No fevers.   Wound Check       Home Medications Prior to Admission medications   Medication Sig Start Date End Date Taking? Authorizing Provider  albuterol (VENTOLIN HFA) 108 (90 Base) MCG/ACT inhaler Inhale 2 puffs into the lungs every 6 (six) hours as needed for wheezing or shortness of breath. 10/18/19   Thurnell Lose, MD  amLODipine (NORVASC) 10 MG tablet Take 1 tablet (10 mg total) by mouth daily. 09/03/21 04/09/22  Skeet Latch, MD  atenolol (TENORMIN) 100 MG tablet Take 1 tablet (100 mg total) by mouth daily. 09/12/21   Skeet Latch, MD  Continuous Blood Gluc Receiver (DEXCOM G6 RECEIVER) DEVI Use to check blood sugars dx code e11.65 06/05/20   [name], FNP  Continuous Blood Gluc Sensor (DEXCOM G6 SENSOR) MISC USE AS DIRECTED CHANGE EVERY 10 DAYS Patient not taking: Reported on 04/25/2022 04/07/22   [name], FNP  Continuous Blood Gluc Transmit (DEXCOM G6 TRANSMITTER) MISC USE TO CHECK BLOOD SUGAR. CHANGE EVERY 90 DAYS Patient not taking: Reported on 03/06/2022 03/28/21   [name], FNP  Cyanocobalamin (VITAMIN B12) 1000 MCG TBCR Take 1,000 mcg by mouth daily.  Patient not taking: Reported on 04/09/2022    [provider]  dapagliflozin propanediol (FARXIGA) 5 MG TABS tablet Take 1  tablet (5 mg total) by mouth daily. 11/14/21   [name], FNP  dicyclomine (BENTYL) 10 MG capsule TAKE 1 CAPSULE BY MOUTH 4 TIMES DAILY AS NEEDED FOR SPASMS 08/08/21   Thornton Park, MD  doxycycline (MONODOX) 100 MG capsule Take 100 mg by mouth 2 (two) times daily. 04/18/22   [provider]  erythromycin ophthalmic ointment Place 1 application. into the right eye at bedtime. Patient not taking: Reported on 03/06/2022 11/18/21   [name], FNP  fexofenadine (ALLEGRA) 180 MG tablet TAKE 1 TABLET BY MOUTH EVERY DAY 04/07/22   [name], FNP  HYDROcodone-acetaminophen (NORCO/VICODIN) 5-325 MG tablet Take 1 tablet by mouth every 4 (four) hours as needed. Patient not taking: Reported on 04/09/2022 03/20/22   Wynona Dove A, DO  ibuprofen (ADVIL) 200 MG tablet Take 200 mg by mouth every 6 (six) hours as needed for moderate pain.    [provider]  medroxyPROGESTERone (PROVERA) 10 MG tablet Take 1 tablet by mouth daily. 11/18/21   [provider]  Multiple Vitamin (MULTIVITAMIN WITH MINERALS) TABS tablet Take 1 tablet by mouth daily.    [provider]  omeprazole (PRILOSEC) 40 MG capsule Take 1 capsule (40 mg total) by mouth 2 (two) times daily. 04/25/22   Vladimir Crofts, PA-C  ondansetron (ZOFRAN ODT) 4 MG disintegrating tablet Take 1 tablet (4 mg total) by mouth every 8 (eight) hours as needed for nausea or vomiting. 07/18/20  Isla Pence, MD  ondansetron (ZOFRAN) 4 MG tablet Take 1 tablet (4 mg total) by mouth every 4 (four) hours as needed for nausea or vomiting. 03/20/22   Jeanell Sparrow, DO  OZEMPIC, 2 MG/DOSE, 8 MG/3ML SOPN INJECT '2mg'$  into THE SKIN ONCE A WEEK 04/17/22   [name], FNP  polyethylene glycol powder (MIRALAX) 17 GM/SCOOP powder Please take 1 capful dissolved in 8 oz of water prn constipation Patient not taking: Reported on 04/25/2022 09/18/20   Thornton Park, MD  spironolactone (ALDACTONE) 50 MG tablet TAKE 1 TABLET BY MOUTH EVERY DAY  03/24/22   Skeet Latch, MD  sucralfate (CARAFATE) 1 g tablet Take 1 tablet (1 g total) by mouth with breakfast, with lunch, and with evening meal for 7 days. 03/20/22 04/09/22  Jeanell Sparrow, DO  SUMAtriptan (IMITREX) 50 MG tablet TAKE 1 TABLET BY MOUTH EVERY 2 HOURS AS NEEDED FOR migraine, may repeat in 2 hours if headache persists or recurs Patient taking differently: Take 50 mg by mouth every 2 (two) hours as needed for migraine. 07/09/21   [name], FNP  topiramate (TOPAMAX) 50 MG tablet TAKE 1 TABLET BY MOUTH 2 TIMES DAILY 03/24/22   [name], FNP  traMADol (ULTRAM) 50 MG tablet Take 1-2 tablets (50-100 mg total) by mouth every 6 (six) hours as needed for moderate pain. 11/06/21   Armandina Gemma, MD  valsartan (DIOVAN) 320 MG tablet Take 1 tablet (320 mg total) by mouth daily. 08/13/21   Skeet Latch, MD  VITAMIN D PO Take 1,000 Units by mouth daily.    [provider]  zolpidem (AMBIEN) 10 MG tablet Take 1 tablet (10 mg total) by mouth at bedtime as needed for sleep. Patient not taking: Reported on 03/06/2022 09/18/20 12/26/22  Laurin Coder, MD      Allergies    Silicone and Tape    Review of Systems   Review of Systems  Physical Exam Updated Vital Signs BP 137/74 (BP Location: Right Arm)   Pulse 100   Temp 98.6 F (37 C) (Oral)   Resp 20   Ht '5\' 4"'$  (1.626 m)   Wt (!) 176.4 kg   SpO2 100%   BMI 66.75 kg/m  Physical Exam Vitals and nursing note reviewed.  Constitutional:      Appearance: Normal appearance.  HENT:     Head: Normocephalic and atraumatic.  Eyes:     General: No scleral icterus.    Conjunctiva/sclera: Conjunctivae normal.  Pulmonary:     Effort: Pulmonary effort is normal. No respiratory distress.  Skin:    Findings: Erythema present. No rash.     Comments: Small circular wound to the abdomen.  Draining purulent drainage.  Some surrounding cellulitis.  Nontender abdomen and infection appears superficial  Neurological:     Mental  Status: She is alert.  Psychiatric:        Mood and Affect: Mood normal.       ED Results / Procedures / Treatments   Labs (all labs ordered are listed, but only abnormal results are displayed) Labs Reviewed  COMPREHENSIVE METABOLIC PANEL - Abnormal; Notable for the following components:      Result Value   Glucose, Bld 119 (*)    All other components within normal limits  CBC - Abnormal; Notable for the following components:   WBC 13.6 (*)    MCV 77.7 (*)    MCH 25.8 (*)    RDW 19.2 (*)    Platelets 486 (*)  All other components within normal limits  AEROBIC/ANAEROBIC CULTURE W GRAM STAIN (SURGICAL/DEEP WOUND)  HCG, SERUM, QUALITATIVE    EKG None  Radiology No results found.  Procedures Procedures   Medications Ordered in ED Medications - No data to display  ED Course/ Medical Decision Making/ A&P                           Medical Decision Making Amount and/or Complexity of Data Reviewed Labs: ordered.   41 year old female presenting with a wound to the abdomen.  Differential includes cellulitis, abscess or deeper tracking infection.  MSSA versus MRSA.   This is not an exhaustive differential.    Past Medical History / Co-morbidities / Social History: Type 2 diabetes, likely inhibiting successful wound healing   Additional history: Per chart review patient was seen by endocrinology earlier today.  The photograph in the chart is from their office.  They are not following the patient's wound but noted on physical exam.   Physical Exam: Pertinent physical exam findings include Small puncture wound to the lower abdomen.  Pictured above.  Some purulent drainage from the site and surrounding warm erythematous skin.  Lab Tests: Considered labs however patient is without systemic symptoms.  Normal heart rate and no temperature today.  I do not suspect that she is experiencing a systemic infection from this wound   Medications: No pain or signs of systemic  infection at this moment  MDM/Disposition: This is a 41 year old female presenting today for wound check.  Recently removed her glucose monitor and now has an infected wound to the abdomen.  She is on day 8 of doxycycline and things are not getting better.  On physical exam she still does have signs of infection.  Cellulitis and some purulent drainage from the site.  At this time I suspect that we need to change her antibiotic.  Will obtain wound culture for susceptibilities if needed down the line.  We will start her on Bactrim for MRSA coverage.  She has been instructed to follow-up with her PCP and she is agreeable to this.   Final Clinical Impression(s) / ED Diagnoses Final diagnoses:  Wound infection    Rx / DC Orders ED Discharge Orders          Ordered    fluconazole (DIFLUCAN) 200 MG tablet  Once PRN        04/25/22 1447    sulfamethoxazole-trimethoprim (BACTRIM DS) 800-160 MG tablet  2 times daily        04/25/22 1447           Results and diagnoses were explained to the patient. Return precautions discussed in full. Patient had no additional questions and expressed complete understanding.   This chart was dictated using voice recognition software.  Despite best efforts to proofread,  errors can occur which can change the documentation meaning.    Rhae Hammock, PA-C 04/25/22 1447    Tretha Sciara, MD 04/25/22 [phone]

## 2022-04-25 NOTE — Patient Instructions (Addendum)
Please go to urgent care for possible culture of wound, will likely need longer course of ABX Follow up with PCP/DM doctor as well Go to the ER if any fever, chills.   Can go to [employer] [address] or [employer] is an osmotic laxative.  It only brings more water into the stool.  This is safe to take daily.  Can take up to 17 gram of miralax twice a day.  Mix with juice or coffee.  Start 1 capful at night for 3-4 days and reassess your response in 3-4 days.  You can increase and decrease the dose based on your response.  Remember, it can take up to 3-4 days to take effect OR for the effects to wear off.   I often pair this with benefiber in the morning to help assure the stool is not too loose.   Toileting tips to help with your constipation - Drink at least 64-80 ounces of water/liquid per day. - Establish a time to try to move your bowels every day.  For many people, this is after a cup of coffee or after a meal such as breakfast. - Sit all of the way back on the toilet keeping your back fairly straight and while sitting up, try to rest the tops of your forearms on your upper thighs.   - Raising your feet with a step stool/squatty potty can be helpful to improve the angle that allows your stool to pass through the rectum. - Relax the rectum feeling it bulge toward the toilet water.  If you feel your rectum raising toward your body, you are contracting rather than relaxing. - Breathe in and slowly exhale. "Belly breath" by expanding your belly towards your belly button. Keep belly expanded as you gently direct pressure down and back to the anus.  A low pitched GRRR sound can assist with increasing intra-abdominal pressure.  - Repeat 3-4 times. If unsuccessful, contract the pelvic floor to restore normal tone and get off the toilet.  Avoid excessive straining. - To reduce excessive wiping by teaching your anus to normally contract, place hands on outer aspect of knees and  resist knee movement outward.  Hold 5-10 second then place hands just inside of knees and resist inward movement of knees.  Hold 5 seconds.  Repeat a few times each way.  Please take your proton pump inhibitor medication, prilosec twice a day  Please take this medication 30 minutes to 1 hour before meals- this makes it more effective.  Avoid spicy and acidic foods Avoid fatty foods Limit your intake of coffee, tea, alcohol, and carbonated drinks Work to maintain a healthy weight Keep the head of the bed elevated at least 3 inches with blocks or a wedge pillow if you are having any nighttime symptoms Stay upright for 2 hours after eating Avoid meals and snacks three to four hours before bedtime  Continue medications

## 2022-04-25 NOTE — Discharge Instructions (Addendum)
I am changing your antibiotic from doxycycline to Bactrim.  Please follow-up early or mid next week with your PCP for repeat wound check.  We discussed that you often get yeast infections after antibiotic treatment.  I have also sent Diflucan to your pharmacy for you to use at the end of your course if you begin to exhibit signs of a yeast infection.  Return to the emergency department with any fevers, chills, diaphoresis or other concerns.  It was a pleasure to meet you and I hope you feel better!

## 2022-04-25 NOTE — ED Triage Notes (Signed)
Patient here POV from Home.  Endorses having Infection around Insertion Site of Glucose Monitor on RLQ ABD. Noted and removed 1 Week ago. Given Antibiotics (Doxycycline) and currently still taking.   Currently still Draining. No Fevers. Sent for Possible Imaging for Assessment.   NAD noted during Triage. A&Ox4. GCS 15. Ambulatory.

## 2022-04-27 LAB — AEROBIC CULTURE W GRAM STAIN (SUPERFICIAL SPECIMEN)

## 2022-04-28 ENCOUNTER — Telehealth (HOSPITAL_BASED_OUTPATIENT_CLINIC_OR_DEPARTMENT_OTHER): Payer: Self-pay | Admitting: *Deleted

## 2022-04-28 NOTE — Telephone Encounter (Signed)
Post ED Visit - Positive Culture Follow-up: Successful Patient Follow-Up  Culture assessed and recommendations reviewed by:  '[]'$  Elenor Quinones, Pharm.D. '[]'$  Heide Guile, Pharm.D., BCPS AQ-ID '[]'$  Parks Neptune, Pharm.D., BCPS '[]'$  Alycia Rossetti, Pharm.D., BCPS '[]'$  [place], [place].D., BCPS, AAHIVP '[]'$  Legrand Como, Pharm.D., BCPS, AAHIVP '[]'$  Salome Arnt, PharmD, BCPS '[]'$  Johnnette Gourd, PharmD, BCPS '[]'$  Hughes Better, PharmD, BCPS '[x]'$  Vira Browns, PharmD  Positive aerobic culture  '[]'$  Patient discharged without antimicrobial prescription and treatment is now indicated '[x]'$  Organism is resistant to prescribed ED discharge antimicrobial '[]'$  Patient with positive blood cultures  Changes discussed with ED provider: Steva Colder, PA-C New antibiotic prescription Amoxicillin '500mg'$  TID x 7 days Called to [place], [place]  Contacted patient, date 04/28/22, time 0944   Rosie Fate 04/28/2022, 9:45 AM

## 2022-04-28 NOTE — Progress Notes (Signed)
ED Antimicrobial Stewardship Positive Culture Follow Up   Emily Phelps is an 40 y.o. female who presented to [facility] on 04/25/2022 with a chief complaint of infection at site of dexcom. Recently finished a course of doxycycline.  Chief Complaint  Patient presents with   Wound Check    Recent Results (from the past 720 hour(s))  Aerobic Culture w Gram Stain (superficial specimen)     Status: None   Collection Time: 04/25/22  3:15 PM   Specimen: Wound  Result Value Ref Range Status   Specimen Description WOUND  Final   Special Requests ABDOMEN  Final   Gram Stain   Final    FEW WBC PRESENT,BOTH PMN AND MONONUCLEAR FEW GRAM POSITIVE COCCI IN PAIRS AND CHAINS    Culture   Final    ABUNDANT GROUP A STREP (S.PYOGENES) ISOLATED Beta hemolytic streptococci are predictably susceptible to penicillin and other beta lactams. Susceptibility testing not routinely performed. Performed at [facility], [place] [address] [place], [place] [address]    Report Status 04/27/2022 FINAL  Final   Patient presented to ED with continued swelling and drainage from site of dexcom. She has been given a course of doxycycline from a telehealth visit. On presentation, infection appeared superficial, no fevers, WBC 13.6. Pt was given bactrim and discharged. Culture from wound is now growing strep pyogenes (group A strep).  Recommend to stop Bacrtim.New antibiotic prescription: amoxicillin 500 mg three times daily for 7 days.  ED Provider: Steva Colder, PA-C  Louanne Belton, PharmD, [facility] PGY1 Pharmacy Resident 04/28/2022 8:57 AM

## 2022-04-29 ENCOUNTER — Telehealth: Payer: Self-pay

## 2022-04-29 NOTE — Telephone Encounter (Signed)
Transition Care Management Unsuccessful Follow-up Telephone Call  Date of discharge and from where:  04/25/2022 [facility]   Attempts:  1st Attempt  Reason for unsuccessful TCM follow-up call:  Left voice message

## 2022-05-01 ENCOUNTER — Telehealth: Payer: Self-pay

## 2022-05-01 NOTE — Telephone Encounter (Signed)
Transition Care Management Follow-up Telephone Call Date of discharge and from where: 04/25/2022 [facility]  How have you been since you were released from the hospital? Pt states she is doing better, wound is starting to scab over & its a little itchy.  Any questions or concerns? No  Items Reviewed: Did the pt receive and understand the discharge instructions provided? Yes  Medications obtained and verified? Yes  Other? Yes  Any new allergies since your discharge? No  Dietary orders reviewed? Yes Do you have support at home? Yes   Home Care and Equipment/Supplies: Were home health services ordered? no If so, what is the name of the agency? N/a   Has the agency set up a time to come to the patient's home? no Were any new equipment or medical supplies ordered?  No What is the name of the medical supply agency? N/a Were you able to get the supplies/equipment? no Do you have any questions related to the use of the equipment or supplies? No  Functional Questionnaire: (I = Independent and D = Dependent) ADLs: i  Bathing/Dressing- i  Meal Prep- i  Eating- i  Maintaining continence- i  Transferring/Ambulation- i  Managing Meds- i  Follow up appointments reviewed:  PCP Hospital f/u appt confirmed? Yes  Scheduled to see janece moore  on n/a @ n/a. [facility] f/u appt confirmed? No  Scheduled to see n/a on n/a @ n/a. Are transportation arrangements needed? No  If their condition worsens, is the pt aware to call PCP or go to the Emergency Dept.? Yes Was the patient provided with contact information for the PCP's office or ED? Yes Was to pt encouraged to call back with questions or concerns? Yes

## 2022-05-05 ENCOUNTER — Ambulatory Visit (INDEPENDENT_AMBULATORY_CARE_PROVIDER_SITE_OTHER): Payer: BC Managed Care – PPO | Admitting: Nurse Practitioner

## 2022-05-05 ENCOUNTER — Encounter: Payer: Self-pay | Admitting: Nurse Practitioner

## 2022-05-05 VITALS — BP 124/68 | HR 77 | Temp 98.1°F | Ht 64.0 in | Wt 387.0 lb

## 2022-05-05 DIAGNOSIS — E1169 Type 2 diabetes mellitus with other specified complication: Secondary | ICD-10-CM

## 2022-05-05 DIAGNOSIS — Z6841 Body Mass Index (BMI) 40.0 and over, adult: Secondary | ICD-10-CM

## 2022-05-05 DIAGNOSIS — Z794 Long term (current) use of insulin: Secondary | ICD-10-CM

## 2022-05-05 DIAGNOSIS — E119 Type 2 diabetes mellitus without complications: Secondary | ICD-10-CM

## 2022-05-05 DIAGNOSIS — L02211 Cutaneous abscess of abdominal wall: Secondary | ICD-10-CM

## 2022-05-05 MED ORDER — SEMAGLUTIDE (1 MG/DOSE) 4 MG/3ML ~~LOC~~ SOPN: 1.0000 mg | PEN_INJECTOR | SUBCUTANEOUS | 1 refills | Status: DC

## 2022-05-05 MED ORDER — MUPIROCIN 2 % EX OINT: 1.0000 | TOPICAL_OINTMENT | Freq: Two times a day (BID) | CUTANEOUS | 0 refills | Status: AC

## 2022-05-05 NOTE — Progress Notes (Signed)
I,Tianna Badgett,acting as a Education administrator for [employer], FNP.,have documented all relevant documentation on the behalf of [name], FNP,as directed by  [name], FNP while in the presence of [name], [place].  Subjective:     Patient ID: Emily Phelps , female    DOB: 11-09-1980 , 41 y.o.   MRN: [id number]   Chief Complaint  Patient presents with   Follow-up    HPI  Patient presents for treatment of would on her abdomen. She had been using her Dexcom for the last 2 years.   She inserted a new meter on 9/29. It was reading error low blood sugar. When she called Dexcom and they advised her to remove and had an infection present. No pain initially. She has tenderness to the area. She went to the ER on 10/13. On October 4th was the error reading. Started with a small spot with the fiberoptic. Became larger and spreaded out like a boil. She had cleaned the area with peroxide and alcohol. She was given antibiotics via teledoc. She was given doxycycline. When went to ER was started on bactrim and is now on amoxicillin due to the culture results. When she went to GI she was sent to ER for evaluation  Wt Readings from Last 3 Encounters: 05/05/22 : (!) 387 lb (175.5 kg) 04/25/22 : (!) 388 lb 14.3 oz (176.4 kg) 04/25/22 : (!) 389 lb (176.4 kg)       Past Medical History:  Diagnosis Date   Allergy    Diabetes mellitus ([place])    Family history of adverse reaction to anesthesia    mother had n/v after    Family history of hypertrophic cardiomyopathy 05/13/2021   GERD (gastroesophageal reflux disease)    Hyperlipidemia    Hypertension    Migraine    Obesity    Palpitations 05/13/2021   Pneumonia    Rectal bleeding    Resistant hypertension 11/28/2014   Sleep apnea    Wears contact lenses      Family History  Problem Relation Age of Onset   Hypertension Mother    Colon polyps Mother    Diabetes Father    Hypertension Father    Prostate cancer Father    Hypertension  Brother    Thyroid disease Maternal Aunt    Heart disease Maternal Uncle    Kidney failure Maternal Uncle    Hypertension Maternal Grandmother    Heart disease Maternal Grandmother    Hypertrophic cardiomyopathy Maternal Grandmother    Colon cancer Neg Hx    Esophageal cancer Neg Hx    Liver cancer Neg Hx    Stomach cancer Neg Hx    Rectal cancer Neg Hx      Current Outpatient Medications:    mupirocin ointment (BACTROBAN) 2 %, Apply 1 Application topically 2 (two) times daily., Disp: 22 g, Rfl: 0   Semaglutide, 1 MG/DOSE, 4 MG/3ML SOPN, Inject 1 mg into the skin once a week., Disp: 9 mL, Rfl: 1   albuterol (VENTOLIN HFA) 108 (90 Base) MCG/ACT inhaler, Inhale 2 puffs into the lungs every 6 (six) hours as needed for wheezing or shortness of breath., Disp: 6.7 g, Rfl: 0   amLODipine (NORVASC) 10 MG tablet, Take 1 tablet (10 mg total) by mouth daily., Disp: 90 tablet, Rfl: 3   amoxicillin (AMOXIL) 500 MG capsule, Take 500 mg by mouth 3 (three) times daily., Disp: , Rfl:    atenolol (TENORMIN) 100 MG tablet, Take 1 tablet (100 mg total)  by mouth daily., Disp: 90 tablet, Rfl: 3   Continuous Blood Gluc Receiver ([place]) DEVI, Use to check blood sugars dx code e11.65, Disp: 3 each, Rfl: 3   Continuous Blood Gluc Sensor (DEXCOM G6 SENSOR) MISC, USE AS DIRECTED CHANGE EVERY 10 DAYS (Patient not taking: Reported on 04/25/2022), Disp: 3 each, Rfl: 1   Continuous Blood Gluc Transmit (DEXCOM G6 TRANSMITTER) MISC, USE TO CHECK BLOOD SUGAR. CHANGE EVERY 90 DAYS, Disp: 1 each, Rfl: 3   dicyclomine (BENTYL) 10 MG capsule, TAKE 1 CAPSULE BY MOUTH 4 TIMES DAILY AS NEEDED FOR SPASMS, Disp: 30 capsule, Rfl: 2   FARXIGA 5 MG TABS tablet, TAKE 1 TABLET BY MOUTH EVERY DAY, Disp: 90 tablet, Rfl: 1   fexofenadine (ALLEGRA) 180 MG tablet, TAKE 1 TABLET BY MOUTH EVERY DAY, Disp: 30 tablet, Rfl: 1   fluconazole (DIFLUCAN) 200 MG tablet, Take 1 tablet (200 mg total) by mouth once as needed for up to 1  dose., Disp: 1 tablet, Rfl: 1   ibuprofen (ADVIL) 200 MG tablet, Take 200 mg by mouth every 6 (six) hours as needed for moderate pain., Disp: , Rfl:    Lancets (UNILET COMFORTOUCH LANCET) MISC, CHECK BLOOD SUGAR 3 TIMES DAILY AS DIRECTED, Disp: 200 each, Rfl: 1   medroxyPROGESTERone (PROVERA) 10 MG tablet, Take 1 tablet by mouth daily., Disp: , Rfl:    Multiple Vitamin (MULTIVITAMIN WITH MINERALS) TABS tablet, Take 1 tablet by mouth daily., Disp: , Rfl:    omeprazole (PRILOSEC) 40 MG capsule, Take 1 capsule (40 mg total) by mouth 2 (two) times daily., Disp: 180 capsule, Rfl: 3   ondansetron (ZOFRAN ODT) 4 MG disintegrating tablet, Take 1 tablet (4 mg total) by mouth every 8 (eight) hours as needed for nausea or vomiting., Disp: 20 tablet, Rfl: 0   ondansetron (ZOFRAN) 4 MG tablet, Take 1 tablet (4 mg total) by mouth every 4 (four) hours as needed for nausea or vomiting., Disp: 6 tablet, Rfl: 0   spironolactone (ALDACTONE) 50 MG tablet, TAKE 1 TABLET BY MOUTH EVERY DAY, Disp: 90 tablet, Rfl: 1   sucralfate (CARAFATE) 1 g tablet, Take 1 tablet (1 g total) by mouth with breakfast, with lunch, and with evening meal for 7 days., Disp: 21 tablet, Rfl: 0   SUMAtriptan (IMITREX) 50 MG tablet, TAKE 1 TABLET BY MOUTH EVERY 2 HOURS AS NEEDED FOR MIGRAINE. MAY REPEAT in 2 hours IF HEADACHE persists OR recurs, Disp: 10 tablet, Rfl: 1   topiramate (TOPAMAX) 50 MG tablet, TAKE 1 TABLET BY MOUTH 2 TIMES DAILY, Disp: 180 tablet, Rfl: 1   traMADol (ULTRAM) 50 MG tablet, Take 1-2 tablets (50-100 mg total) by mouth every 6 (six) hours as needed for moderate pain., Disp: 15 tablet, Rfl: 0   valsartan (DIOVAN) 320 MG tablet, Take 1 tablet (320 mg total) by mouth daily., Disp: 90 tablet, Rfl: 3   VITAMIN D PO, Take 1,000 Units by mouth daily., Disp: , Rfl:    Allergies  Allergen Reactions   Silicone Rash    Other reaction(s): Unknown   Tape Rash    Other reaction(s): Unknown Other reaction(s): Unknown     Review  of Systems  Constitutional: Negative.   Respiratory: Negative.    Cardiovascular: Negative.   Gastrointestinal: Negative.   Skin:  Positive for wound.  Neurological: Negative.   Psychiatric/Behavioral: Negative.       Today's Vitals   05/05/22 1109  BP: 124/68  Pulse: 77  Temp: 98.1 F (36.7  C)  TempSrc: Oral  Weight: (!) 387 lb (175.5 kg)  Height: '5\' 4"'$  (1.626 m)   Body mass index is 66.43 kg/m.   Objective:  Physical Exam Vitals reviewed.  Constitutional:      General: She is not in acute distress.    Appearance: Normal appearance. She is obese.  Cardiovascular:     Rate and Rhythm: Normal rate and regular rhythm.     Pulses: Normal pulses.     Heart sounds: Normal heart sounds. No murmur heard. Pulmonary:     Effort: Pulmonary effort is normal. No respiratory distress.     Breath sounds: Normal breath sounds. No wheezing.  Skin:    General: Skin is warm and dry.     Capillary Refill: Capillary refill takes less than 2 seconds.     Coloration: Skin is not jaundiced.     Comments: Right lower abdomen has healing open area from her Dexcom sensor.   Neurological:     General: No focal deficit present.     Mental Status: She is alert and oriented to person, place, and time.     Cranial Nerves: No cranial nerve deficit.     Motor: No weakness.  Psychiatric:        Mood and Affect: Mood normal.        Behavior: Behavior normal.        Thought Content: Thought content normal.        Judgment: Judgment normal.         Assessment And Plan:     1. Abscess of skin of abdomen Comments: She has a healing wound to her abdomen, treated with antibiotic. Advised to continue antibiotics until completely gone. - mupirocin ointment (BACTROBAN) 2 %; Apply 1 Application topically 2 (two) times daily.  Dispense: 22 g; Refill: 0  2. Controlled type 2 diabetes mellitus without complication, with long-term current use of insulin (HCC) Comments: Advised to place her Dexcom on her  arm. - Semaglutide, 1 MG/DOSE, 4 MG/3ML SOPN; Inject 1 mg into the skin once a week.  Dispense: 9 mL; Refill: 1  3. Morbid obesity with BMI of 60.0-69.9, adult ([place]) She is encouraged to strive for BMI less than 30 to decrease cardiac risk. Advised to aim for at least 150 minutes of exercise per week.  Patient was given opportunity to ask questions. Patient verbalized understanding of the plan and was able to repeat key elements of the plan. All questions were answered to their satisfaction.  [name], FNP   I, [name], FNP, have reviewed all documentation for this visit. The documentation on 05/05/22 for the exam, diagnosis, procedures, and orders are all accurate and complete.   IF YOU HAVE BEEN REFERRED TO A SPECIALIST, IT MAY TAKE 1-2 WEEKS TO SCHEDULE/PROCESS THE REFERRAL. IF YOU HAVE NOT HEARD FROM US/SPECIALIST IN TWO WEEKS, PLEASE GIVE [place] A CALL AT [phone] X [phone].   THE PATIENT IS ENCOURAGED TO PRACTICE SOCIAL DISTANCING DUE TO THE COVID-19 PANDEMIC.

## 2022-05-05 NOTE — Progress Notes (Signed)
Reviewed.  Jayci Ellefson L. Byrant Valent, MD, MPH  

## 2022-05-05 NOTE — Patient Instructions (Signed)

## 2022-05-06 ENCOUNTER — Ambulatory Visit: Payer: BC Managed Care – PPO | Admitting: Nurse Practitioner

## 2022-05-08 ENCOUNTER — Other Ambulatory Visit: Payer: Self-pay | Admitting: Nurse Practitioner

## 2022-05-08 DIAGNOSIS — E119 Type 2 diabetes mellitus without complications: Secondary | ICD-10-CM

## 2022-05-08 NOTE — Telephone Encounter (Signed)
Approve Dexcom G6 Transmitter device

## 2022-05-15 DIAGNOSIS — L02211 Cutaneous abscess of abdominal wall: Secondary | ICD-10-CM

## 2022-05-15 HISTORY — DX: Cutaneous abscess of abdominal wall: L02.211

## 2022-05-16 ENCOUNTER — Other Ambulatory Visit (HOSPITAL_BASED_OUTPATIENT_CLINIC_OR_DEPARTMENT_OTHER): Payer: Self-pay | Admitting: Cardiovascular Disease

## 2022-05-20 ENCOUNTER — Ambulatory Visit (HOSPITAL_BASED_OUTPATIENT_CLINIC_OR_DEPARTMENT_OTHER): Payer: BC Managed Care – PPO | Admitting: Cardiovascular Disease

## 2022-06-07 ENCOUNTER — Other Ambulatory Visit: Payer: Self-pay | Admitting: Nurse Practitioner

## 2022-06-23 ENCOUNTER — Other Ambulatory Visit: Payer: Self-pay | Admitting: Cardiovascular Disease

## 2022-06-23 NOTE — Telephone Encounter (Signed)
Rx request sent to pharmacy.  

## 2022-06-25 ENCOUNTER — Encounter (HOSPITAL_BASED_OUTPATIENT_CLINIC_OR_DEPARTMENT_OTHER): Payer: Self-pay | Admitting: Cardiovascular Disease

## 2022-06-25 ENCOUNTER — Ambulatory Visit (INDEPENDENT_AMBULATORY_CARE_PROVIDER_SITE_OTHER): Payer: BC Managed Care – PPO | Admitting: Cardiovascular Disease

## 2022-06-25 VITALS — BP 124/78 | HR 78 | Ht 64.0 in | Wt 398.3 lb

## 2022-06-25 DIAGNOSIS — Z6841 Body Mass Index (BMI) 40.0 and over, adult: Secondary | ICD-10-CM

## 2022-06-25 DIAGNOSIS — E782 Mixed hyperlipidemia: Secondary | ICD-10-CM | POA: Diagnosis not present

## 2022-06-25 DIAGNOSIS — I10 Essential (primary) hypertension: Secondary | ICD-10-CM | POA: Diagnosis not present

## 2022-06-25 DIAGNOSIS — Z006 Encounter for examination for normal comparison and control in clinical research program: Secondary | ICD-10-CM

## 2022-06-25 NOTE — Patient Instructions (Addendum)
Medication Instructions:  Your physician recommends that you continue on your current medications as directed. Please refer to the Current Medication list given to you today.  *If you need a refill on your cardiac medications before your next appointment, please call your pharmacy*  Lab Work: NONE  Testing/Procedures: NONE  Follow-Up: At [facility], you and your health needs are our priority.  As part of our continuing mission to provide you with exceptional heart care, we have created designated Provider Care Teams.  These Care Teams include your primary Cardiologist (physician) and Advanced Practice Providers (APPs -  Physician Assistants and Nurse Practitioners) who all work together to provide you with the care you need, when you need it.  We recommend signing up for the patient portal called "MyChart".  Sign up information is provided on this After Visit Summary.  MyChart is used to connect with patients for Virtual Visits (Telemedicine).  Patients are able to view lab/test results, encounter notes, upcoming appointments, etc.  Non-urgent messages can be sent to your provider as well.   To learn more about what you can do with MyChart, go to [email or web address].    Your next appointment:   12/12/2022 8:20 AM WITH DR [facility]   Exercise recommendations: The American Heart Association recommends 150 minutes of moderate intensity exercise weekly. Try 30 minutes of moderate intensity exercise 4-5 times per week. This could include walking, jogging, or swimming.
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
## 2022-06-25 NOTE — Addendum Note (Signed)
Addended by: Skeet Latch C on: 06/25/2022 01:49 PM   Modules accepted: Orders

## 2022-06-25 NOTE — Assessment & Plan Note (Signed)
She has been on several trips recently and her weight has increased.  Encouraged her to get back into diet and exercise.  Continue Ozempic.

## 2022-06-25 NOTE — Research (Signed)
I saw pt today after Dr. Hetland's follow up visit. Pt is in Dr. [place]'s Virtual Care HTN Study. Pt filled out research survey. Pt was enrolled in Group 2. Pt has successfully reached her blood pressure goal and completed the Virtual Care HTN Study. 

## 2022-06-25 NOTE — Assessment & Plan Note (Signed)
Blood pressure has been much better controlled.  It was initially elevated but better on repeat.  She will work on increasing her exercise to at least 150 minutes weekly.  Continue atenolol, amlodipine and valsartan.

## 2022-06-25 NOTE — Assessment & Plan Note (Signed)
Lipids are improving with diet and exercise.  Continue working on this.  No medication for now.

## 2022-06-30 ENCOUNTER — Other Ambulatory Visit (HOSPITAL_BASED_OUTPATIENT_CLINIC_OR_DEPARTMENT_OTHER): Payer: Self-pay | Admitting: Cardiovascular Disease

## 2022-06-30 ENCOUNTER — Other Ambulatory Visit: Payer: Self-pay | Admitting: Nurse Practitioner

## 2022-07-01 NOTE — Telephone Encounter (Signed)
Rx request sent to pharmacy.  

## 2022-07-29 ENCOUNTER — Other Ambulatory Visit: Payer: Self-pay | Admitting: Gastroenterology

## 2022-07-29 DIAGNOSIS — K219 Gastro-esophageal reflux disease without esophagitis: Secondary | ICD-10-CM

## 2022-08-01 ENCOUNTER — Telehealth: Payer: Self-pay | Admitting: Physician Assistant

## 2022-08-01 NOTE — Telephone Encounter (Signed)
Inbound call from patient stating insurance will only pay for one pill. Calling about omeprazole. Please advise.

## 2022-08-01 NOTE — Telephone Encounter (Signed)
Please submit PA for twice daily Omeprazole. Patient not controlled with once daily Omeprazole

## 2022-08-04 ENCOUNTER — Ambulatory Visit: Payer: Self-pay

## 2022-08-04 DIAGNOSIS — Z0000 Encounter for general adult medical examination without abnormal findings: Secondary | ICD-10-CM

## 2022-08-04 NOTE — Progress Notes (Signed)
Appointment Outcome: Completed, Session #: [id number]/u (final)                        Start time: 12:31pm   End time: 1:06pm   Total Mins: 35 mins  AGREEMENTS SECTION   Overall goal(s): Increase physical activity Maintain healthy eating habits Stress management                                           Agreement/Action Steps: Increasing physical activity Walk 7,500 steps per day Track steps using iPhone   Maintaining healthy eating habits [facility] calories/daily Speak to provider about progress with diet Maintain consumption of 84-100 oz of water/fluid per day Limit fast food consumption by cooking at home Monitor sodium intake by reading food labels (?1,500 mg of sodium/day) Practice portion control   Stress management Utilize support system Maintain healthy boundaries Incorporate positive affirmations and positive self-talk Deep breathing Take an emotional break when needed    Progress Notes:  Patient shared that she began [employer] to aid in getting more movement on her days off, which includes climbing stairs. 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 Patient mentioned that when she is home, she will take breaks to stretch or walk around.  Patient expressed her frustrations about not seeing results after following this diet for 4 years. Patient stated that she spoke with her provider about her progress with implementing her prescribed diet. Patient stated that she tries to eat as they advise, but she reports that she cannot eat all the food recommended. Patient shared her macro goals (e.g., protein 150 g/daily and carbs 115 g/daily) her provider and stated that she is not meeting them including her caloric intake.  Patient stated that when she is away from home, she tries to make the best food choices. Patient shared while on  vacation, she chose to eat fish, fruit, and salads. Patient reported that she allows herself to eat fast food once a week. Patient stated that when she eats out she look at the menu ahead of time to pick a healthier option before getting to the restaurant to deter her from choosing something on the whim. Patient stated that this approach has been helpful because she has been cooking at home during the week via prepping 2 meals for the week. Patient stated that at home she eats salads daily. Patient shared that she enjoys cucumbers or spinach dip and a few chips as snacks.   Patient mentioned that on most days she can drink a minimal of 84 ounces of water/fluid per day. Patient stated that she will have a cup of coffee in the morning and uses a 32 oz cup with water to track how much she drinks in a day. Patient stated that on the days that she does not consume the minimum about of water is due to her being full from drinking earlier in the day along with the food she has consumed. Patient stated that after getting off work her liquid consumption slows down.   Patient shared that she has encountered some stressors over the past 6 months that she began to give herself the space and time to grieve. Patient stated that  during a short period of time sitting with her feelings and implementing positive self-talk, she will evaluate the situation and ask herself what her next steps are. Patient stated that she has been incorporating emotional breaks by taking days off work and going on vacations to decompress. Patient stated that she is scheduling time for herself.   Patient stated that she ensures she attend her therapy sessions so she can continue to cope. Patient stated that therapy has helped her go about various situations in a different way. Patient continues to practice deep breathing as a stress management technique when needed. Patient shared that she has begun to give herself a grace periods that enables her  to navigate better by not putting a lot of pressure on herself.  Patient stated that she continues to enforce her boundaries with others. Patient shared how she has been able to do so with family members. Patient is sharing what her needs and boundaries are to help others respect her need for space or taking on responsibilities. Patient shared that her family is supportive of each other when it comes to managing stress as well as improving eating habits.   Indicators of Success and Accountability: Patient has been engaged with other services to aid in improving her eating habits and managing stress over the past 6 months. Readiness: Patient is in the action stage of stress management increasing physical activity and maintaining healthy eating habits. Strengths and Supports: Patient is being supported by family. Patient is relying on being resilient and having patience. Challenges and Barriers: Patient reported financial and emotional stressors which are challenges to her implementing her action steps.   Coaching Outcomes: Patient completed her last session of health coaching today. Patient stated that she feels she has the necessary knowledge and skills to continue working on her goals.   Attempted: Fulfilled - Patient is limiting her fast-food intake to once a week by meal prepping. Patient is reading food labels to track sodium and other nutrients such as protein. Patient is practicing portion control. Patient has continued to implement all action steps to manage stress. Partial - Patient has been able to consume the minimal amount of water on most days but have not been able to be consistent every day. Patient is averaging 3,000-4,000 steps per day instead of 7,500 steps. Patient is challenged with meeting her dietary requirements and consuming 1500-1800 calories daily due to infrequent meals.

## 2022-08-05 ENCOUNTER — Telehealth: Payer: Self-pay | Admitting: Licensed Clinical Social Worker

## 2022-08-05 NOTE — Telephone Encounter (Signed)
H&V Care Navigation CSW Progress Note  Clinical Social Worker contacted patient by phone to f/u as pt was showing as self pay after appt with Health Coach on 1/22. No answer today at [phone], left voicemail to provide assistance if interested.  Patient is participating in a Managed Medicaid Plan:  No, currently listed as self pay, BCBS previously in system.   SDOH Screenings   Food Insecurity: No Food Insecurity (05/13/2021)  Housing: Low Risk  (05/13/2021)  Transportation Needs: No Transportation Needs (05/13/2021)  Alcohol Screen: Low Risk  (05/13/2021)  Depression (PHQ2-9): Low Risk  (04/09/2022)  Financial Resource Strain: Low Risk  (05/13/2021)  Physical Activity: Inactive (05/13/2021)  Tobacco Use: Low Risk  (06/25/2022)   Westley Hummer, MSW, LCSW Clinical Social Worker [place]  [phone]- work cell phone (preferred) [phone]- desk phone

## 2022-08-11 ENCOUNTER — Telehealth (HOSPITAL_BASED_OUTPATIENT_CLINIC_OR_DEPARTMENT_OTHER): Payer: Self-pay | Admitting: Licensed Clinical Social Worker

## 2022-08-11 ENCOUNTER — Other Ambulatory Visit (HOSPITAL_BASED_OUTPATIENT_CLINIC_OR_DEPARTMENT_OTHER): Payer: Self-pay | Admitting: Cardiovascular Disease

## 2022-08-11 NOTE — Telephone Encounter (Signed)
H&V Care Navigation CSW Progress Note  Clinical Social Worker contacted patient by phone to f/u as pt was showing as self pay after appt with [facility] on 1/22. No answer again today at [phone], voicemail full today therefore unable to leave voicemail. I remain available should pt be in need of assistance applications/care navigation support- will not actively follow at this time.   Patient is participating in a Managed Medicaid Plan:  No, currently listed as self pay, BCBS previously in system.   SDOH Screenings   Food Insecurity: No Food Insecurity (05/13/2021)  Housing: Low Risk  (05/13/2021)  Transportation Needs: No Transportation Needs (05/13/2021)  Alcohol Screen: Low Risk  (05/13/2021)  Depression (PHQ2-9): Low Risk  (04/09/2022)  Financial Resource Strain: Low Risk  (05/13/2021)  Physical Activity: Inactive (05/13/2021)  Tobacco Use: Low Risk  (06/25/2022)   Westley Hummer, MSW, LCSW Clinical Social Worker [place]  [phone]- work cell phone (preferred) [phone]- desk phone

## 2022-08-11 NOTE — Telephone Encounter (Signed)
H&V Care Navigation CSW Progress Note  Clinical Social Worker  received call back from pt  to f/u on my missed call to [phone]. She shares that she currently has [place] will go in system and upload it. Has had multiple family members in hospice so it has been a busy time. Provided verbal support for pt, thanked her for calling us back and reminded her if she receives any bills that are not processed under her insurance to call and have them rebill it with her current coverage. No additional questions at this time.  Patient is participating in a Managed Medicaid Plan:  No, [employer]   [place]: No Food Insecurity (05/13/2021)  Housing: Low Risk  (05/13/2021)  Transportation Needs: No Transportation Needs (05/13/2021)  Alcohol Screen: Low Risk  (05/13/2021)  Depression (PHQ2-9): Low Risk  (04/09/2022)  Financial Resource Strain: Low Risk  (05/13/2021)  Physical Activity: Inactive (05/13/2021)  Tobacco Use: Low Risk  (06/25/2022)   Westley Hummer, MSW, LCSW Clinical Social Worker [place]  [phone]- work cell phone (preferred) [phone]- desk phone

## 2022-08-11 NOTE — Telephone Encounter (Signed)
Rx(s) sent to pharmacy electronically.  

## 2022-08-14 NOTE — Telephone Encounter (Signed)
Patient is calling check up on PA for twice daily Omeprazole. Please advise

## 2022-08-20 ENCOUNTER — Other Ambulatory Visit: Payer: Self-pay | Admitting: Nurse Practitioner

## 2022-08-27 ENCOUNTER — Telehealth: Payer: Self-pay | Admitting: Pharmacy Technician

## 2022-08-27 NOTE — Telephone Encounter (Signed)
Patient Advocate Encounter  Received notification from [facility] that prior authorization for OMEPRAZOLE 40MG is required.   PA submitted on 2.14.24 Key [id number] Status is pending

## 2022-08-27 NOTE — Telephone Encounter (Signed)
Patient Advocate Encounter  Prior Authorization for OMEPRAZOLE 40MG has been approved.    PA# [id number] Effective dates: 2.14.24 through 2.13.27

## 2022-08-30 ENCOUNTER — Other Ambulatory Visit: Payer: Self-pay | Admitting: Nurse Practitioner

## 2022-08-30 DIAGNOSIS — E119 Type 2 diabetes mellitus without complications: Secondary | ICD-10-CM

## 2022-09-08 ENCOUNTER — Other Ambulatory Visit: Payer: Self-pay

## 2022-09-08 DIAGNOSIS — Z6841 Body Mass Index (BMI) 40.0 and over, adult: Secondary | ICD-10-CM | POA: Insufficient documentation

## 2022-09-09 ENCOUNTER — Encounter: Payer: Self-pay | Admitting: Physician Assistant

## 2022-09-19 ENCOUNTER — Other Ambulatory Visit: Payer: Self-pay | Admitting: Nurse Practitioner

## 2022-09-26 LAB — HM DIABETES EYE EXAM

## 2022-09-30 ENCOUNTER — Ambulatory Visit: Payer: No Typology Code available for payment source | Admitting: Nurse Practitioner

## 2022-09-30 ENCOUNTER — Encounter: Payer: Self-pay | Admitting: Nurse Practitioner

## 2022-09-30 VITALS — BP 128/78 | HR 78 | Temp 98.5°F | Ht 64.0 in | Wt >= 6400 oz

## 2022-09-30 DIAGNOSIS — I10 Essential (primary) hypertension: Secondary | ICD-10-CM

## 2022-09-30 DIAGNOSIS — Z794 Long term (current) use of insulin: Secondary | ICD-10-CM

## 2022-09-30 DIAGNOSIS — Z23 Encounter for immunization: Secondary | ICD-10-CM

## 2022-09-30 DIAGNOSIS — E782 Mixed hyperlipidemia: Secondary | ICD-10-CM

## 2022-09-30 DIAGNOSIS — E1169 Type 2 diabetes mellitus with other specified complication: Secondary | ICD-10-CM | POA: Diagnosis not present

## 2022-09-30 DIAGNOSIS — Z0000 Encounter for general adult medical examination without abnormal findings: Secondary | ICD-10-CM | POA: Diagnosis not present

## 2022-09-30 DIAGNOSIS — Z1231 Encounter for screening mammogram for malignant neoplasm of breast: Secondary | ICD-10-CM

## 2022-09-30 DIAGNOSIS — Z1321 Encounter for screening for nutritional disorder: Secondary | ICD-10-CM

## 2022-09-30 DIAGNOSIS — E210 Primary hyperparathyroidism: Secondary | ICD-10-CM

## 2022-09-30 DIAGNOSIS — Z6841 Body Mass Index (BMI) 40.0 and over, adult: Secondary | ICD-10-CM

## 2022-09-30 MED ORDER — MOUNJARO 5 MG/0.5ML ~~LOC~~ SOAJ: 5.0000 mg | SUBCUTANEOUS | 0 refills | Status: DC

## 2022-09-30 NOTE — Progress Notes (Signed)
I,Emily Phelps,acting as a Education administrator for Emily Brine, FNP.,have documented all relevant documentation on the behalf of Emily Brine, FNP,as directed by  Emily Brine, FNP while in the presence of Emily Phelps, [place].   Subjective:     Patient ID: Emily Phelps , female    DOB: [birth year] , 42 y.o.   MRN: [id number]   Chief Complaint  Patient presents with   Annual Exam    HPI  Patient presents today for annual exam.  She has seen Dr. Oval Linsey and is rescheduled for Urology.  She has seen GYN - she has a lot of small fibroids and unable to have removed due to being small. She was started on Progesterone.   Wt Readings from Last 3 Encounters: 09/30/22 : (!) 401 lb (181.9 kg) 06/25/22 : (!) 398 lb 4.8 oz (180.7 kg) 05/05/22 : (!) 387 lb (175.5 kg)  She was out of Ozempic for 2 months in September. She has been back on since that time 2 mg.      Past Medical History:  Diagnosis Date   Allergy    Diabetes mellitus (HCC)    Family history of adverse reaction to anesthesia    mother had n/v after    Family history of hypertrophic cardiomyopathy 05/13/2021   GERD (gastroesophageal reflux disease)    Hyperlipidemia    Hypertension    Legionella pneumonia ([place]) 01/03/2020   Migraine    Obesity    Palpitations 05/13/2021   Pneumonia    Rectal bleeding    Resistant hypertension 11/28/2014   Sleep apnea    Wears contact lenses      Family History  Problem Relation Age of Onset   Hypertension Mother    Colon polyps Mother    Diabetes Father    Hypertension Father    Prostate cancer Father    Hypertension Brother    Thyroid disease Maternal Aunt    Heart disease Maternal Uncle    Kidney failure Maternal Uncle    Hypertension Maternal Grandmother    Heart disease Maternal Grandmother    Hypertrophic cardiomyopathy Maternal Grandmother    Colon cancer Neg Hx    Esophageal cancer Neg Hx    Liver cancer Neg Hx    Stomach cancer Neg Hx    Rectal cancer Neg Hx       Current Outpatient Medications:    albuterol (VENTOLIN HFA) 108 (90 Base) MCG/ACT inhaler, Inhale 2 puffs into the lungs every 6 (six) hours as needed for wheezing or shortness of breath., Disp: 6.7 g, Rfl: 0   amLODipine (NORVASC) 10 MG tablet, TAKE 1 TABLET BY MOUTH EVERY DAY, Disp: 90 tablet, Rfl: 2   atenolol (TENORMIN) 100 MG tablet, TAKE 1 TABLET BY MOUTH EVERY DAY, Disp: 90 tablet, Rfl: 3   Continuous Blood Gluc Receiver (DEXCOM G6 RECEIVER) DEVI, Use to check blood sugars dx code e11.65, Disp: 3 each, Rfl: 3   Continuous Blood Gluc Sensor (DEXCOM G6 SENSOR) MISC, USE TO CHECK BLOOD SUGAR AND CHANGE EVERY 10 DAYS, Disp: 3 each, Rfl: 5   Continuous Blood Gluc Transmit (DEXCOM G6 TRANSMITTER) MISC, USE TO CHECK BLOOD SUGAR. CHANGE EVERY 90 DAYS, Disp: 1 each, Rfl: 3   dicyclomine (BENTYL) 10 MG capsule, TAKE 1 CAPSULE BY MOUTH 4 TIMES DAILY AS NEEDED FOR SPASMS, Disp: 30 capsule, Rfl: 2   FARXIGA 5 MG TABS tablet, TAKE 1 TABLET BY MOUTH EVERY DAY, Disp: 90 tablet, Rfl: 1   fexofenadine (ALLEGRA) 180 MG tablet, TAKE  1 TABLET BY MOUTH EVERY DAY, Disp: 90 tablet, Rfl: 1   ibuprofen (ADVIL) 200 MG tablet, Take 200 mg by mouth every 6 (six) hours as needed for moderate pain., Disp: , Rfl:    Lancets (UNILET COMFORTOUCH LANCET) MISC, check blood sugar 3 TIMES DAILY AS DIRECTED, Disp: 200 each, Rfl: 1   Multiple Vitamin (MULTIVITAMIN WITH MINERALS) TABS tablet, Take 1 tablet by mouth daily., Disp: , Rfl:    mupirocin ointment (BACTROBAN) 2 %, Apply 1 Application topically 2 (two) times daily., Disp: 22 g, Rfl: 0   omeprazole (PRILOSEC) 40 MG capsule, TAKE 1 CAPSULE BY MOUTH 2 TIMES DAILY, Disp: 180 capsule, Rfl: 3   ondansetron (ZOFRAN) 4 MG tablet, Take 1 tablet (4 mg total) by mouth every 4 (four) hours as needed for nausea or vomiting., Disp: 6 tablet, Rfl: 0   progesterone (PROMETRIUM) 100 MG capsule, Take 200 mg by mouth at bedtime., Disp: , Rfl:    spironolactone (ALDACTONE) 50 MG  tablet, TAKE 1 TABLET BY MOUTH EVERY DAY, Disp: 90 tablet, Rfl: 1   SUMAtriptan (IMITREX) 50 MG tablet, TAKE 1 TABLET BY MOUTH EVERY 2 HOURS AS NEEDED FOR MIGRAINE. MAY REPEAT in 2 hours IF HEADACHE persists OR recurs, Disp: 10 tablet, Rfl: 1   tirzepatide (MOUNJARO) 5 MG/0.5ML Pen, Inject 5 mg into the skin once a week., Disp: 2 mL, Rfl: 0   topiramate (TOPAMAX) 50 MG tablet, TAKE 1 TABLET BY MOUTH 2 TIMES DAILY, Disp: 180 tablet, Rfl: 1   traMADol (ULTRAM) 50 MG tablet, Take 1-2 tablets (50-100 mg total) by mouth every 6 (six) hours as needed for moderate pain., Disp: 15 tablet, Rfl: 0   valsartan (DIOVAN) 320 MG tablet, TAKE 1 TABLET BY MOUTH EVERY DAY, Disp: 90 tablet, Rfl: 2   VITAMIN D PO, Take 1,000 Units by mouth daily., Disp: , Rfl:    sucralfate (CARAFATE) 1 g tablet, Take 1 tablet (1 g total) by mouth with breakfast, with lunch, and with evening meal for 7 days., Disp: 21 tablet, Rfl: 0   Allergies  Allergen Reactions   Latex Rash   Silicone Rash    Other reaction(s): Unknown   Tape Rash    Other reaction(s): Unknown Other reaction(s): Unknown      The patient states she uses oral progesterone-only contraceptive for birth control. Patient's last menstrual period was 08/14/2022. Negative for Dysmenorrhea and Negative for Menorrhagia. She will have longer than normal menstrual cycles. Negative for: breast discharge, breast lump(s), breast pain and breast self exam. Associated symptoms include abnormal vaginal bleeding. Pertinent negatives include abnormal bleeding (hematology), anxiety, decreased libido, depression, difficulty falling sleep, dyspareunia, history of infertility, nocturia, sexual dysfunction, sleep disturbances, urinary incontinence, urinary urgency, vaginal discharge and vaginal itching. Diet regular; she is eating 1200 calories a day - continues going to [facility]. They are trying to get her to eat more to help boost her metabolism. The patient states her exercise level  is by PT with walking for her right knee and some walking.   The patient's tobacco use is:  Social History   Tobacco Use  Smoking Status Never  Smokeless Tobacco Never   She has been exposed to passive smoke. The patient's alcohol use is:  Social History   Substance and Sexual Activity  Alcohol Use Yes   Alcohol/week: 4.0 standard drinks of alcohol   Types: 4 Standard drinks or equivalent per week   Comment: last use 03/05/22 3-4x/wk     Review of Systems  Constitutional: Negative.   HENT: Negative.    Eyes: Negative.   Respiratory: Negative.    Cardiovascular: Negative.   Gastrointestinal: Negative.   Endocrine: Negative.   Genitourinary: Negative.   Musculoskeletal: Negative.   Skin: Negative.   Allergic/Immunologic: Negative.   Neurological: Negative.   Hematological: Negative.   Psychiatric/Behavioral: Negative.       Today's Vitals   09/30/22 1025  BP: 128/78  Pulse: 78  Temp: 98.5 F (36.9 C)  TempSrc: Oral  SpO2: 99%  Weight: (!) 401 lb (181.9 kg)  Height: 5\' 4"  (1.626 m)   Body mass index is 68.83 kg/m.   Objective:  Physical Exam Vitals reviewed.  Constitutional:      General: She is not in acute distress.    Appearance: Normal appearance. She is well-developed. She is obese.  HENT:     Head: Normocephalic and atraumatic.     Right Ear: Hearing, tympanic membrane, ear canal and external ear normal. There is no impacted cerumen.     Left Ear: Hearing, tympanic membrane, ear canal and external ear normal. There is no impacted cerumen.     Nose:     Comments: Deferred - masked    Mouth/Throat:     Comments: Deferred - masked Eyes:     General: Lids are normal.     Extraocular Movements: Extraocular movements intact.     Conjunctiva/sclera: Conjunctivae normal.     Pupils: Pupils are equal, round, and reactive to light.     Funduscopic exam:    Right eye: No papilledema.        Left eye: No papilledema.  Neck:     Thyroid: No thyroid mass.      Vascular: No carotid bruit.  Cardiovascular:     Rate and Rhythm: Normal rate and regular rhythm.     Pulses: Normal pulses.     Heart sounds: Normal heart sounds. No murmur heard. Pulmonary:     Effort: Pulmonary effort is normal. No respiratory distress.     Breath sounds: Normal breath sounds. No wheezing.  Chest:     Chest wall: No mass.  Breasts:    Tanner Score is 5.     Right: Normal. No mass or tenderness.     Left: Normal. No mass or tenderness.  Abdominal:     General: Abdomen is flat. Bowel sounds are normal. There is no distension.     Palpations: Abdomen is soft.     Tenderness: There is no abdominal tenderness.  Genitourinary:    Rectum: Guaiac result negative.  Musculoskeletal:        General: No swelling. Normal range of motion.     Cervical back: Full passive range of motion without pain, normal range of motion and neck supple.     Right lower leg: No edema.     Left lower leg: No edema.  Lymphadenopathy:     Upper Body:     Right upper body: No supraclavicular, axillary or pectoral adenopathy.     Left upper body: No supraclavicular, axillary or pectoral adenopathy.  Skin:    General: Skin is warm and dry.     Capillary Refill: Capillary refill takes less than 2 seconds.     Coloration: Skin is not jaundiced.  Neurological:     General: No focal deficit present.     Mental Status: She is alert and oriented to person, place, and time.     Cranial Nerves: No cranial nerve deficit.  Sensory: No sensory deficit.     Motor: No weakness.  Psychiatric:        Mood and Affect: Mood normal.        Behavior: Behavior normal.        Thought Content: Thought content normal.        Judgment: Judgment normal.         Assessment And Plan:     1. Encounter for health maintenance examination Behavior modifications discussed and diet history reviewed.   Pt will continue to exercise regularly and modify diet with low GI, plant based foods and decrease intake  of processed foods.  Recommend intake of daily multivitamin, Vitamin D, and calcium.  Recommend for preventive screenings, as well as recommend immunizations that include influenza, TDAP  2. Encounter for screening mammogram for malignant neoplasm of breast Pt instructed on Self Breast Exam.According to ACOG guidelines Women aged 66 and older are recommended to get an annual mammogram. Form completed and given to patient contact the [facility] for appointment scheduing.  Pt encouraged to get annual mammogram, will order a baseline mammogram - MM Digital Screening; Future  3. Encounter for vitamin deficiency screening - VITAMIN D 25 Hydroxy (Vit-D Deficiency, Fractures)  4. Need for tetanus booster Will give tetanus vaccine today while in office. Refer to order management. TDAP will be administered to adults 75-37 years old every 10 years. - Tdap vaccine greater than or equal to 7yo IM  5. Essential hypertension Comments: Blood pressure is well controlled. Continue current medications. - EKG 12-Lead - CBC - CMP14+EGFR  6. Mixed hyperlipidemia Comments: Cholesterol levels are stable. Continue statin, tolerating well - Lipid panel  7. Controlled type 2 diabetes mellitus without complication, with long-term current use of insulin ([place]) Comments: Will change to [facility] starting at 5 mg weekly, HgbA1c went up slightly with GYN. - tirzepatide [facility]) 5 MG/0.5ML Pen; Inject 5 mg into the skin once a week.  Dispense: 2 mL; Refill: 0  8. Morbid obesity with BMI of 60.0-69.9, adult ([place]) Comments: Encouraged to increase physical activity and eat a healthy diet. She is encouraged to strive for BMI less than 30 to decrease cardiac risk. Advised to aim for at least 150 minutes of exercise per week.  9. Hyperparathyroidism, primary ([place]) Comments: Continue f/u with Endocrinology.     Patient was given opportunity to ask questions. Patient verbalized understanding of the plan and  was able to repeat key elements of the plan. All questions were answered to their satisfaction.   Emily Brine, FNP   I, Emily Brine, FNP, have reviewed all documentation for this visit. The documentation on 09/30/22 for the exam, diagnosis, procedures, and orders are all accurate and complete.   THE PATIENT IS ENCOURAGED TO PRACTICE SOCIAL DISTANCING DUE TO THE COVID-19 PANDEMIC.

## 2022-10-01 LAB — CBC
Hematocrit: 38.1 % (ref 34.0–46.6)
Hemoglobin: 12 g/dL (ref 11.1–15.9)
MCH: 24.5 pg — ABNORMAL LOW (ref 26.6–33.0)
MCHC: 31.5 g/dL (ref 31.5–35.7)
MCV: 78 fL — ABNORMAL LOW (ref 79–97)
Platelets: 458 10*3/uL — ABNORMAL HIGH (ref 150–450)
RBC: 4.89 x10E6/uL (ref 3.77–5.28)
RDW: 19.9 % — ABNORMAL HIGH (ref 11.7–15.4)
WBC: 11.8 10*3/uL — ABNORMAL HIGH (ref 3.4–10.8)

## 2022-10-01 LAB — VITAMIN D 25 HYDROXY (VIT D DEFICIENCY, FRACTURES): Vit D, 25-Hydroxy: 24.5 ng/mL — ABNORMAL LOW (ref 30.0–100.0)

## 2022-10-01 LAB — CMP14+EGFR
ALT: 19 IU/L (ref 0–32)
AST: 15 IU/L (ref 0–40)
Albumin/Globulin Ratio: 1.4 (ref 1.2–2.2)
Albumin: 4.3 g/dL (ref 3.9–4.9)
Alkaline Phosphatase: 100 IU/L (ref 44–121)
BUN/Creatinine Ratio: 16 (ref 9–23)
BUN: 14 mg/dL (ref 6–20)
Bilirubin Total: 0.6 mg/dL (ref 0.0–1.2)
CO2: 21 mmol/L (ref 20–29)
Calcium: 9.6 mg/dL (ref 8.7–10.2)
Chloride: 103 mmol/L (ref 96–106)
Creatinine, Ser: 0.89 mg/dL (ref 0.57–1.00)
Globulin, Total: 3 g/dL (ref 1.5–4.5)
Glucose: 103 mg/dL — ABNORMAL HIGH (ref 70–99)
Potassium: 4.3 mmol/L (ref 3.5–5.2)
Sodium: 140 mmol/L (ref 134–144)
Total Protein: 7.3 g/dL (ref 6.0–8.5)
eGFR: 85 mL/min/{1.73_m2} (ref >59)

## 2022-10-01 LAB — LIPID PANEL
Chol/HDL Ratio: 5.1 ratio — ABNORMAL HIGH (ref 0.0–4.4)
Cholesterol, Total: 205 mg/dL — ABNORMAL HIGH (ref 100–199)
HDL: 40 mg/dL (ref >39)
LDL Chol Calc (NIH): 134 mg/dL — ABNORMAL HIGH (ref 0–99)
Triglycerides: 175 mg/dL — ABNORMAL HIGH (ref 0–149)
VLDL Cholesterol Cal: 31 mg/dL (ref 5–40)

## 2022-10-02 ENCOUNTER — Telehealth: Payer: Self-pay

## 2022-10-02 NOTE — Telephone Encounter (Signed)
Patient provided FMLA paperwork to be completed, requesting accommodations for additional/unscheduled restroom breaks while at work.  Paperwork given to provider.

## 2022-10-08 ENCOUNTER — Ambulatory Visit: Payer: Self-pay | Admitting: Nurse Practitioner

## 2022-10-09 ENCOUNTER — Other Ambulatory Visit: Payer: Self-pay | Admitting: Gastroenterology

## 2022-10-09 DIAGNOSIS — R1084 Generalized abdominal pain: Secondary | ICD-10-CM

## 2022-10-09 MED ORDER — ATORVASTATIN CALCIUM 10 MG PO TABS: 10.0000 mg | ORAL_TABLET | Freq: Every day | ORAL | 2 refills | Status: DC

## 2022-10-09 MED ORDER — VITAMIN D (ERGOCALCIFEROL) 1.25 MG (50000 UNIT) PO CAPS: 50000.0000 [IU] | ORAL_CAPSULE | ORAL | 1 refills | Status: DC

## 2022-10-30 ENCOUNTER — Other Ambulatory Visit: Payer: Self-pay | Admitting: Nurse Practitioner

## 2022-10-30 DIAGNOSIS — E119 Type 2 diabetes mellitus without complications: Secondary | ICD-10-CM

## 2022-11-22 ENCOUNTER — Encounter (HOSPITAL_BASED_OUTPATIENT_CLINIC_OR_DEPARTMENT_OTHER): Payer: Self-pay

## 2022-11-22 ENCOUNTER — Other Ambulatory Visit: Payer: Self-pay

## 2022-11-22 DIAGNOSIS — M549 Dorsalgia, unspecified: Secondary | ICD-10-CM | POA: Insufficient documentation

## 2022-11-22 DIAGNOSIS — M7650 Patellar tendinitis, unspecified knee: Secondary | ICD-10-CM | POA: Insufficient documentation

## 2022-11-22 DIAGNOSIS — Z91040 Latex allergy status: Secondary | ICD-10-CM | POA: Diagnosis not present

## 2022-11-22 DIAGNOSIS — E119 Type 2 diabetes mellitus without complications: Secondary | ICD-10-CM | POA: Insufficient documentation

## 2022-11-22 DIAGNOSIS — Z79899 Other long term (current) drug therapy: Secondary | ICD-10-CM | POA: Insufficient documentation

## 2022-11-22 DIAGNOSIS — M25559 Pain in unspecified hip: Secondary | ICD-10-CM | POA: Insufficient documentation

## 2022-11-22 DIAGNOSIS — Z794 Long term (current) use of insulin: Secondary | ICD-10-CM | POA: Diagnosis not present

## 2022-11-22 DIAGNOSIS — J45909 Unspecified asthma, uncomplicated: Secondary | ICD-10-CM | POA: Insufficient documentation

## 2022-11-22 DIAGNOSIS — I10 Essential (primary) hypertension: Secondary | ICD-10-CM | POA: Insufficient documentation
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
## 2022-11-23 ENCOUNTER — Emergency Department (HOSPITAL_BASED_OUTPATIENT_CLINIC_OR_DEPARTMENT_OTHER): Payer: No Typology Code available for payment source

## 2022-11-23 ENCOUNTER — Emergency Department (HOSPITAL_BASED_OUTPATIENT_CLINIC_OR_DEPARTMENT_OTHER)
Admission: EM | Admit: 2022-11-23 | Discharge: 2022-11-23 | Disposition: A | Discharge status: Home / Self Care | Payer: No Typology Code available for payment source | Attending: Emergency Medicine | Admitting: Emergency Medicine

## 2022-11-23 ENCOUNTER — Emergency Department (HOSPITAL_BASED_OUTPATIENT_CLINIC_OR_DEPARTMENT_OTHER): Payer: No Typology Code available for payment source | Admitting: Radiology

## 2022-11-23 DIAGNOSIS — S39012A Strain of muscle, fascia and tendon of lower back, initial encounter: Secondary | ICD-10-CM

## 2022-11-23 DIAGNOSIS — S29019A Strain of muscle and tendon of unspecified wall of thorax, initial encounter: Secondary | ICD-10-CM

## 2022-11-23 DIAGNOSIS — S161XXA Strain of muscle, fascia and tendon at neck level, initial encounter: Secondary | ICD-10-CM

## 2022-11-23 DIAGNOSIS — S82001A Unspecified fracture of right patella, initial encounter for closed fracture: Secondary | ICD-10-CM

## 2022-11-23 DIAGNOSIS — S7001XA Contusion of right hip, initial encounter: Secondary | ICD-10-CM

## 2022-11-23 NOTE — Discharge Instructions (Addendum)
Wear Ace bandage for comfort and support.  Minimal weightbearing on the right leg until followed up by orthopedics.  Follow-up with orthopedics regarding the x-ray of your right knee.  The contact information for Dr. Jena Gauss has been provided in this discharge summary for you to call and make these arrangements.

## 2022-11-23 NOTE — ED Notes (Signed)
ED Provider at bedside. 
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
## 2022-12-02 ENCOUNTER — Ambulatory Visit (INDEPENDENT_AMBULATORY_CARE_PROVIDER_SITE_OTHER): Payer: No Typology Code available for payment source | Admitting: Nurse Practitioner

## 2022-12-02 ENCOUNTER — Encounter: Payer: Self-pay | Admitting: Nurse Practitioner

## 2022-12-02 VITALS — BP 130/70 | HR 85 | Temp 98.9°F | Ht 64.0 in | Wt >= 6400 oz

## 2022-12-02 DIAGNOSIS — R350 Frequency of micturition: Secondary | ICD-10-CM

## 2022-12-02 DIAGNOSIS — E1169 Type 2 diabetes mellitus with other specified complication: Secondary | ICD-10-CM

## 2022-12-02 DIAGNOSIS — M7989 Other specified soft tissue disorders: Secondary | ICD-10-CM

## 2022-12-02 DIAGNOSIS — S8291XD Unspecified fracture of right lower leg, subsequent encounter for closed fracture with routine healing: Secondary | ICD-10-CM

## 2022-12-02 DIAGNOSIS — E119 Type 2 diabetes mellitus without complications: Secondary | ICD-10-CM

## 2022-12-02 DIAGNOSIS — I10 Essential (primary) hypertension: Secondary | ICD-10-CM

## 2022-12-02 DIAGNOSIS — F329 Major depressive disorder, single episode, unspecified: Secondary | ICD-10-CM | POA: Diagnosis not present

## 2022-12-02 DIAGNOSIS — S8291XA Unspecified fracture of right lower leg, initial encounter for closed fracture: Secondary | ICD-10-CM | POA: Insufficient documentation

## 2022-12-02 DIAGNOSIS — M545 Low back pain, unspecified: Secondary | ICD-10-CM

## 2022-12-02 DIAGNOSIS — Z87440 Personal history of urinary (tract) infections: Secondary | ICD-10-CM

## 2022-12-02 DIAGNOSIS — Z794 Long term (current) use of insulin: Secondary | ICD-10-CM

## 2022-12-02 DIAGNOSIS — Z6841 Body Mass Index (BMI) 40.0 and over, adult: Secondary | ICD-10-CM

## 2022-12-02 LAB — POCT URINALYSIS DIP (CLINITEK)
Bilirubin, UA: NEGATIVE
Glucose, UA: 500 mg/dL — AB
Ketones, POC UA: NEGATIVE mg/dL
Leukocytes, UA: NEGATIVE
Nitrite, UA: NEGATIVE
POC PROTEIN,UA: NEGATIVE
Spec Grav, UA: 1.025 (ref 1.010–1.025)
Urobilinogen, UA: 0.2 E.U./dL
pH, UA: 7 (ref 5.0–8.0)

## 2022-12-02 MED ORDER — CYCLOBENZAPRINE HCL 10 MG PO TABS
10.0000 mg | ORAL_TABLET | Freq: Three times a day (TID) | ORAL | 0 refills | Status: AC | PRN
Start: 2022-12-02 — End: ?
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
## 2022-12-03 LAB — MICROALBUMIN / CREATININE URINE RATIO
Creatinine, Urine: 69.9 mg/dL
Microalb/Creat Ratio: 11 mg/g creat (ref 0–29)
Microalbumin, Urine: 7.4 ug/mL

## 2022-12-03 LAB — HEMOGLOBIN A1C
Est. average glucose Bld gHb Est-mCnc: 114 mg/dL
Hgb A1c MFr Bld: 5.6 % (ref 4.8–5.6)

## 2022-12-04 LAB — URINE CULTURE

## 2022-12-05 ENCOUNTER — Ambulatory Visit (HOSPITAL_COMMUNITY)
Admission: RE | Admit: 2022-12-05 | Discharge: 2022-12-05 | Disposition: A | Discharge status: Home / Self Care | Payer: No Typology Code available for payment source | Source: Ambulatory Visit | Attending: Nurse Practitioner | Admitting: Nurse Practitioner

## 2022-12-05 DIAGNOSIS — M7989 Other specified soft tissue disorders: Secondary | ICD-10-CM

## 2022-12-05 DIAGNOSIS — R609 Edema, unspecified: Secondary | ICD-10-CM | POA: Diagnosis not present

## 2022-12-09 ENCOUNTER — Other Ambulatory Visit: Payer: Self-pay | Admitting: Nurse Practitioner

## 2022-12-09 DIAGNOSIS — E119 Type 2 diabetes mellitus without complications: Secondary | ICD-10-CM

## 2022-12-12 ENCOUNTER — Ambulatory Visit (INDEPENDENT_AMBULATORY_CARE_PROVIDER_SITE_OTHER): Payer: No Typology Code available for payment source | Admitting: Cardiovascular Disease

## 2022-12-12 ENCOUNTER — Encounter (HOSPITAL_BASED_OUTPATIENT_CLINIC_OR_DEPARTMENT_OTHER): Payer: Self-pay | Admitting: Cardiovascular Disease

## 2022-12-12 VITALS — BP 141/85 | HR 83 | Ht 64.0 in | Wt >= 6400 oz

## 2022-12-12 DIAGNOSIS — I10 Essential (primary) hypertension: Secondary | ICD-10-CM | POA: Diagnosis not present

## 2022-12-12 DIAGNOSIS — E782 Mixed hyperlipidemia: Secondary | ICD-10-CM

## 2022-12-12 DIAGNOSIS — G4733 Obstructive sleep apnea (adult) (pediatric): Secondary | ICD-10-CM

## 2022-12-12 LAB — COMPREHENSIVE METABOLIC PANEL
ALT: 19 IU/L (ref 0–32)
AST: 15 IU/L (ref 0–40)
Albumin/Globulin Ratio: 1.3 (ref 1.2–2.2)
Albumin: 4.4 g/dL (ref 3.9–4.9)
Alkaline Phosphatase: 122 IU/L — ABNORMAL HIGH (ref 44–121)
BUN/Creatinine Ratio: 15 (ref 9–23)
BUN: 13 mg/dL (ref 6–20)
Bilirubin Total: 0.4 mg/dL (ref 0.0–1.2)
CO2: 21 mmol/L (ref 20–29)
Calcium: 10 mg/dL (ref 8.7–10.2)
Chloride: 103 mmol/L (ref 96–106)
Creatinine, Ser: 0.88 mg/dL (ref 0.57–1.00)
Globulin, Total: 3.4 g/dL (ref 1.5–4.5)
Glucose: 88 mg/dL (ref 70–99)
Potassium: 4.2 mmol/L (ref 3.5–5.2)
Sodium: 141 mmol/L (ref 134–144)
Total Protein: 7.8 g/dL (ref 6.0–8.5)
eGFR: 86 mL/min/{1.73_m2} (ref >59)

## 2022-12-12 LAB — LIPID PANEL
Chol/HDL Ratio: 3.8 ratio (ref 0.0–4.4)
Cholesterol, Total: 147 mg/dL (ref 100–199)
HDL: 39 mg/dL — ABNORMAL LOW (ref >39)
LDL Chol Calc (NIH): 87 mg/dL (ref 0–99)
Triglycerides: 114 mg/dL (ref 0–149)
VLDL Cholesterol Cal: 21 mg/dL (ref 5–40)
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
## 2022-12-12 NOTE — Patient Instructions (Signed)
Medication Instructions:  Your physician recommends that you continue on your current medications as directed. Please refer to the Current Medication list given to you today.   *If you need a refill on your cardiac medications before your next appointment, please call your pharmacy*  Lab Work: LP/CMET TODAY   If you have labs (blood work) drawn today and your tests are completely normal, you will receive your results only by: MyChart Message (if you have MyChart) OR A paper copy in the mail If you have any lab test that is abnormal or we need to change your treatment, we will call you to review the results  Testing/Procedures: NONE  Follow-Up: At [facility], you and your health needs are our priority.  As part of our continuing mission to provide you with exceptional heart care, we have created designated Provider Care Teams.  These Care Teams include your primary Cardiologist (physician) and Advanced Practice Providers (APPs -  Physician Assistants and Nurse Practitioners) who all work together to provide you with the care you need, when you need it.  We recommend signing up for the patient portal called "MyChart".  Sign up information is provided on this After Visit Summary.  MyChart is used to connect with patients for Virtual Visits (Telemedicine).  Patients are able to view lab/test results, encounter notes, upcoming appointments, etc.  Non-urgent messages can be sent to your provider as well.   To learn more about what you can do with MyChart, go to [email or web address].    Your next appointment:   6 month(s)  Provider:   Chilton Si, MD
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
## 2022-12-18 ENCOUNTER — Ambulatory Visit: Payer: No Typology Code available for payment source | Admitting: Physician Assistant

## 2022-12-18 ENCOUNTER — Encounter: Payer: Self-pay | Admitting: Physician Assistant

## 2022-12-18 VITALS — BP 138/78 | HR 85 | Ht 64.0 in | Wt 393.0 lb

## 2022-12-18 DIAGNOSIS — K219 Gastro-esophageal reflux disease without esophagitis: Secondary | ICD-10-CM | POA: Diagnosis not present

## 2022-12-18 DIAGNOSIS — K760 Fatty (change of) liver, not elsewhere classified: Secondary | ICD-10-CM | POA: Diagnosis not present

## 2022-12-18 DIAGNOSIS — K5909 Other constipation: Secondary | ICD-10-CM | POA: Diagnosis not present

## 2022-12-18 DIAGNOSIS — Z6841 Body Mass Index (BMI) 40.0 and over, adult: Secondary | ICD-10-CM

## 2022-12-18 DIAGNOSIS — R1084 Generalized abdominal pain: Secondary | ICD-10-CM | POA: Diagnosis not present

## 2022-12-18 MED ORDER — DICYCLOMINE HCL 10 MG PO CAPS
ORAL_CAPSULE | ORAL | 2 refills | Status: DC
Start: 2022-12-18 — End: 2023-09-08

## 2022-12-18 NOTE — Progress Notes (Signed)
Agree with assessment / plan as outlined.  

## 2022-12-18 NOTE — Patient Instructions (Addendum)
Gastroparesis likely from [facility] Please do small frequent meals like 4-6 meals a day.  Eat and drink liquids at separate times.  Avoid high fiber foods, cook your vegetables, avoid high fat food.  Suggest spreading protein throughout the day (greek yogurt, glucerna, soft meat, milk, eggs) Choose soft foods that you can mash with a fork When you are more symptomatic, change to pureed foods foods and liquids.  Consider reading "Living well with Gastroparesis" by Reuel Derby Gastroparesis is a condition in which food takes longer than normal to empty from the stomach. This condition is also known as delayed gastric emptying. It is usually a long-term (chronic) condition. There is no cure, but there are treatments and things that you can do at home to help relieve symptoms. Treating the underlying condition that causes gastroparesis can also help relieve symptoms What are the causes? In many cases, the cause of this condition is not known. Possible causes include: A hormone (endocrine) disorder, such as hypothyroidism or diabetes. A nervous system disease, such as Parkinson's disease or multiple sclerosis. Cancer, infection, or surgery that affects the stomach or vagus nerve. The vagus nerve runs from your chest, through your neck, and to the lower part of your brain. A connective tissue disorder, such as scleroderma. Certain medicines. What increases the risk? You are more likely to develop this condition if: You have certain disorders or diseases. These may include: An endocrine disorder. An eating disorder. Amyloidosis. Scleroderma. Parkinson's disease. Multiple sclerosis. Cancer or infection of the stomach or the vagus nerve. You have had surgery on your stomach or vagus nerve. You take certain medicines. You are female. What are the signs or symptoms? Symptoms of this condition include: Feeling full after eating very little or a loss of appetite. Nausea, vomiting, or  heartburn. Bloating of your abdomen. Inconsistent blood sugar (glucose) levels on blood tests. Unexplained weight loss. Acid from the stomach coming up into the esophagus (gastroesophageal reflux). Sudden tightening (spasm) of the stomach, which can be painful. Symptoms may come and go. Some people may not notice any symptoms. How is this diagnosed? This condition is diagnosed with tests, such as: Tests that check how long it takes food to move through the stomach and intestines. These tests include: Upper gastrointestinal (GI) series. For this test, you drink a liquid that shows up well on X-rays, and then X-rays are taken of your intestines. Gastric emptying scintigraphy. For this test, you eat food that contains a small amount of radioactive material, and then scans are taken. Wireless capsule GI monitoring system. For this test, you swallow a pill (capsule) that records information about how foods and fluid move through your stomach. Gastric manometry. For this test, a tube is passed down your throat and into your stomach to measure electrical and muscular activity. Endoscopy. For this test, a long, thin tube with a camera and light on the end is passed down your throat and into your stomach to check for problems in your stomach lining. Ultrasound. This test uses sound waves to create images of the inside of your body. This can help rule out gallbladder disease or pancreatitis as a cause of your symptoms. How is this treated? There is no cure for this condition, but treatment and home care may relieve symptoms. Treatment may include: Treating the underlying cause. Managing your symptoms by making changes to your diet and exercise habits. Taking medicines to control nausea and vomiting and to stimulate stomach muscles. Getting food through a feeding tube in  the hospital. This may be done in severe cases. Having surgery to insert a device called a gastric electrical stimulator into your body.  This device helps improve stomach emptying and control nausea and vomiting. Follow these instructions at home: Take over-the-counter and prescription medicines only as told by your health care provider. Follow instructions from your health care provider about eating or drinking restrictions. Your health care provider may recommend that you: Eat smaller meals more often. Eat low-fat foods. Eat low-fiber forms of high-fiber foods. For example, eat cooked vegetables instead of raw vegetables. Have only liquid foods instead of solid foods. Liquid foods are easier to digest. Drink enough fluid to keep your urine pale yellow. Exercise as often as told by your health care provider. Keep all follow-up visits. This is important. Contact a health care provider if you: Notice that your symptoms do not improve with treatment. Have new symptoms. Get help right away if you: Have severe pain in your abdomen that does not improve with treatment. Have nausea that is severe or does not go away. Vomit every time you drink fluids. Summary Gastroparesis is a long-term (chronic) condition in which food takes longer than normal to empty from the stomach. Symptoms include nausea, vomiting, heartburn, bloating of your abdomen, and loss of appetite. Eating smaller portions, low-fat foods, and low-fiber forms of high-fiber foods may help you manage your symptoms. Get help right away if you have severe pain in your abdomen. This information is not intended to replace advice given to you by your health care provider. Make sure you discuss any questions you have with your health care provider. Document Revised: 11/07/2019 Document Reviewed: 11/07/2019 Elsevier Patient Education  2021 [employer]  Please give [place] a call to schedule your 6 month follow up with Quentin Mulling.  _______________________________________________________  If your blood pressure at your visit was 140/90 or greater, please contact your  primary care physician to follow up on this.  _______________________________________________________  If you are age 40 or older, your body mass index should be between 23-30. Your Body mass index is 67.46 kg/m. If this is out of the aforementioned range listed, please consider follow up with your Primary Care Provider.  If you are age 3 or younger, your body mass index should be between 19-25. Your Body mass index is 67.46 kg/m. If this is out of the aformentioned range listed, please consider follow up with your Primary Care Provider.   ________________________________________________________  The [place] [facility] providers would like to encourage you to use [facility] to communicate with providers for non-urgent requests or questions.  Due to long hold times on the telephone, sending your provider a message by [facility] may be a faster and more efficient way to get a response.  Please allow 48 business hours for a response.  Please remember that this is for non-urgent requests.  _______________________________________________________ It was a pleasure to see you today!  Thank you for trusting me with your gastrointestinal care!

## 2022-12-19 ENCOUNTER — Other Ambulatory Visit: Payer: Self-pay | Admitting: Cardiovascular Disease

## 2022-12-26 ENCOUNTER — Other Ambulatory Visit: Payer: Self-pay | Admitting: Nurse Practitioner

## 2023-01-19 ENCOUNTER — Other Ambulatory Visit: Payer: Self-pay | Admitting: Nurse Practitioner

## 2023-01-19 DIAGNOSIS — E119 Type 2 diabetes mellitus without complications: Secondary | ICD-10-CM

## 2023-02-02 ENCOUNTER — Encounter: Payer: Self-pay | Admitting: Nurse Practitioner

## 2023-02-02 ENCOUNTER — Ambulatory Visit (INDEPENDENT_AMBULATORY_CARE_PROVIDER_SITE_OTHER): Payer: No Typology Code available for payment source | Admitting: Nurse Practitioner

## 2023-02-02 ENCOUNTER — Other Ambulatory Visit: Payer: Self-pay | Admitting: Nurse Practitioner

## 2023-02-02 VITALS — BP 126/84 | HR 80 | Temp 98.0°F | Ht 64.0 in | Wt >= 6400 oz

## 2023-02-02 DIAGNOSIS — F329 Major depressive disorder, single episode, unspecified: Secondary | ICD-10-CM

## 2023-02-02 DIAGNOSIS — E1169 Type 2 diabetes mellitus with other specified complication: Secondary | ICD-10-CM | POA: Diagnosis not present

## 2023-02-02 DIAGNOSIS — I10 Essential (primary) hypertension: Secondary | ICD-10-CM | POA: Diagnosis not present

## 2023-02-02 DIAGNOSIS — F4321 Adjustment disorder with depressed mood: Secondary | ICD-10-CM

## 2023-02-02 DIAGNOSIS — E119 Type 2 diabetes mellitus without complications: Secondary | ICD-10-CM

## 2023-02-02 DIAGNOSIS — Z794 Long term (current) use of insulin: Secondary | ICD-10-CM

## 2023-02-02 DIAGNOSIS — E785 Hyperlipidemia, unspecified: Secondary | ICD-10-CM

## 2023-02-02 DIAGNOSIS — E782 Mixed hyperlipidemia: Secondary | ICD-10-CM

## 2023-02-02 DIAGNOSIS — Z800 Family history of malignant neoplasm of digestive organs: Secondary | ICD-10-CM

## 2023-02-02 DIAGNOSIS — Z6841 Body Mass Index (BMI) 40.0 and over, adult: Secondary | ICD-10-CM

## 2023-02-02 MED ORDER — MOUNJARO 7.5 MG/0.5ML ~~LOC~~ SOAJ
7.5000 mg | SUBCUTANEOUS | 3 refills | Status: DC
Start: 2023-02-02 — End: 2023-06-09

## 2023-02-02 NOTE — Progress Notes (Signed)
Emily Phelps, CMA,acting as a Neurosurgeon for Emily Felts, FNP.,have documented all relevant documentation on the behalf of Emily Felts, FNP,as directed by  Emily Felts, FNP while in the presence of Emily Felts, FNP.  Subjective:  Patient ID: Emily Phelps , female    DOB: 12-15-1980 , 42 y.o.   MRN: [id number]  Chief Complaint  Patient presents with   Hypertension   Diabetes    HPI  Patient presents today for a BP and DM follow up, patient reports compliance with medications. Patient denies any Chest pain, SOB, or headaches. Patient has no other concerns today.   She adds, dropping off FMLA forms in March. She asks if the forms are complete. Her HR department states not receiving them. She has just went back to work since her father passed in May and she was out on bereavement and vacation. Forms need to be back dated to march. She has FMLA for migraines  She is in physical therapy for her knee pain. She has 2 sessions a week with chiropractor and 2 with PT for her knee She has been to the bariatric clinic with [place].   Diabetes She presents for her follow-up diabetic visit. She has type 2 diabetes mellitus. There are no hypoglycemic associated symptoms. Pertinent negatives for hypoglycemia include no headaches. There are no diabetic associated symptoms. Pertinent negatives for diabetes include no chest pain. There are no hypoglycemic complications. There are no diabetic complications. Risk factors for coronary artery disease include obesity and sedentary lifestyle. Current diabetic treatment includes oral agent (dual therapy). She is compliant with treatment all of the time. Diabetic current diet: she has been doing a keto diet. When asked about meal planning, she reported none. She has not had a previous visit with a dietitian. (Blood sugar fasting 120-130 ) An ACE inhibitor/angiotensin II receptor blocker is being taken. She does not see a podiatrist.Eye exam is not current.   Hypertension This is a chronic problem. The current episode started more than 1 year ago. The problem is unchanged. The problem is controlled. Pertinent negatives include no anxiety, chest pain, headaches or palpitations. Risk factors for coronary artery disease include obesity and diabetes mellitus. There is no history of chronic renal disease.  Headache  This is a chronic problem. The current episode started more than 1 year ago. The problem occurs intermittently (having at least 4 times a week). The pain is located in the Temporal (temples and behind her eyes) region. The quality of the pain is described as aching. Pertinent negatives include no abdominal pain. She has tried nothing for the symptoms. Her past medical history is significant for hypertension.     Past Medical History:  Diagnosis Date   Abscess of skin of abdomen 05/15/2022   She has a healing wound to her abdomen, treated with antibiotic. Advised to continue antibiotics until completely gone.   Allergy    Diabetes mellitus (HCC)    Family history of adverse reaction to anesthesia    mother had n/v after    Family history of hypertrophic cardiomyopathy 05/13/2021   GERD (gastroesophageal reflux disease)    Hyperlipidemia    Hypertension    Legionella pneumonia (HCC) 01/03/2020   Migraine    Obesity    Palpitations 05/13/2021   Pneumonia    Rectal bleeding    Resistant hypertension 11/28/2014   Sleep apnea    Wears contact lenses      Family History  Problem Relation Age of Onset  Hypertension Mother    Colon polyps Mother    Diabetes Father    Hypertension Father    Prostate cancer Father    Pancreatic cancer Father    Hypertension Brother    Thyroid disease Maternal Aunt    Heart disease Maternal Uncle    Kidney failure Maternal Uncle    Hypertension Maternal Grandmother    Heart disease Maternal Grandmother    Hypertrophic cardiomyopathy Maternal Grandmother    Colon cancer Neg Hx    Esophageal  cancer Neg Hx    Liver cancer Neg Hx    Stomach cancer Neg Hx    Rectal cancer Neg Hx      Current Outpatient Medications:    albuterol (VENTOLIN HFA) 108 (90 Base) MCG/ACT inhaler, Inhale 2 puffs into the lungs every 6 (six) hours as needed for wheezing or shortness of breath., Disp: 6.7 g, Rfl: 0   ALLERGY RELIEF 180 MG tablet, TAKE 1 TABLET BY MOUTH EVERY DAY, Disp: 90 tablet, Rfl: 0   amLODipine (NORVASC) 10 MG tablet, TAKE 1 TABLET BY MOUTH EVERY DAY, Disp: 90 tablet, Rfl: 2   atenolol (TENORMIN) 100 MG tablet, TAKE 1 TABLET BY MOUTH EVERY DAY, Disp: 90 tablet, Rfl: 3   atorvastatin (LIPITOR) 10 MG tablet, TAKE 1 TABLET BY MOUTH EVERY DAY, Disp: 30 tablet, Rfl: 2   Continuous Blood Gluc Receiver (DEXCOM G6 RECEIVER) DEVI, Use to check blood sugars dx code e11.65, Disp: 3 each, Rfl: 3   Continuous Blood Gluc Sensor (DEXCOM G6 SENSOR) MISC, USE TO CHECK BLOOD SUGAR AND CHANGE EVERY 10 DAYS, Disp: 3 each, Rfl: 5   Continuous Blood Gluc Transmit (DEXCOM G6 TRANSMITTER) MISC, USE TO CHECK BLOOD SUGAR. CHANGE EVERY 90 DAYS, Disp: 1 each, Rfl: 3   cyclobenzaprine (FLEXERIL) 10 MG tablet, Take 1 tablet (10 mg total) by mouth 3 (three) times daily as needed for muscle spasms., Disp: 30 tablet, Rfl: 0   dicyclomine (BENTYL) 10 MG capsule, TAKE 1 CAPSULE BY MOUTH 4 TIMES DAILY AS NEEDED FOR SPASMS, Disp: 30 capsule, Rfl: 2   FARXIGA 5 MG TABS tablet, TAKE 1 TABLET BY MOUTH EVERY DAY, Disp: 90 tablet, Rfl: 0   Ketoprofen (FROTEK) 10 % CREA, Apply 1 application  topically 4 (four) times daily., Disp: , Rfl:    Lancets (UNILET COMFORTOUCH LANCET) MISC, check blood sugar 3 TIMES DAILY AS DIRECTED, Disp: 200 each, Rfl: 1   Lidocaine-Menthol (ZYLOTROL) 4-4 % PTCH, Apply 1 patch topically every 12 (twelve) hours as needed., Disp: , Rfl:    Multiple Vitamin (MULTIVITAMIN WITH MINERALS) TABS tablet, Take 1 tablet by mouth daily., Disp: , Rfl:    mupirocin ointment (BACTROBAN) 2 %, Apply 1 Application  topically 2 (two) times daily., Disp: 22 g, Rfl: 0   omeprazole (PRILOSEC) 40 MG capsule, TAKE 1 CAPSULE BY MOUTH 2 TIMES DAILY, Disp: 180 capsule, Rfl: 3   ondansetron (ZOFRAN) 4 MG tablet, Take 1 tablet (4 mg total) by mouth every 4 (four) hours as needed for nausea or vomiting., Disp: 6 tablet, Rfl: 0   progesterone (PROMETRIUM) 100 MG capsule, Take 200 mg by mouth at bedtime., Disp: , Rfl:    spironolactone (ALDACTONE) 50 MG tablet, TAKE 1 TABLET BY MOUTH EVERY DAY, Disp: 90 tablet, Rfl: 1   SUMAtriptan (IMITREX) 50 MG tablet, TAKE 1 TABLET BY MOUTH EVERY 2 HOURS AS NEEDED FOR MIGRAINE. MAY REPEAT in 2 hours IF HEADACHE persists OR recurs, Disp: 10 tablet, Rfl: 1   tirzepatide [facility])  7.5 MG/0.5ML Pen, Inject 7.5 mg into the skin once a week., Disp: 2 mL, Rfl: 3   topiramate (TOPAMAX) 50 MG tablet, TAKE 1 TABLET BY MOUTH 2 TIMES DAILY, Disp: 180 tablet, Rfl: 1   traMADol (ULTRAM) 50 MG tablet, Take 1-2 tablets (50-100 mg total) by mouth every 6 (six) hours as needed for moderate pain., Disp: 15 tablet, Rfl: 0   valsartan (DIOVAN) 320 MG tablet, TAKE 1 TABLET BY MOUTH EVERY DAY, Disp: 90 tablet, Rfl: 2   VITAMIN D PO, Take 1,000 Units by mouth daily., Disp: , Rfl:    Vitamin D, Ergocalciferol, (DRISDOL) 1.25 MG (50000 UNIT) CAPS capsule, TAKE 1 CAPSULE BY MOUTH EVERY 7 DAYS, Disp: 12 capsule, Rfl: 1   Allergies  Allergen Reactions   Latex Rash   Silicone Rash    Other reaction(s): Unknown   Tape Rash    Other reaction(s): Unknown Other reaction(s): Unknown     Review of Systems  Constitutional: Negative.   HENT: Negative.    Eyes: Negative.   Respiratory: Negative.    Cardiovascular: Negative.  Negative for chest pain and palpitations.  Gastrointestinal: Negative.  Negative for abdominal pain.  Neurological: Negative.  Negative for headaches.  Psychiatric/Behavioral: Negative.       Today's Vitals   02/02/23 1113  BP: 126/84  Pulse: 80  Temp: 98 F (36.7 C)  SpO2: 98%   Weight: (!) 408 lb (185.1 kg)  Height: 5\' 4"  (1.626 m)   Body mass index is 70.03 kg/m.  Wt Readings from Last 3 Encounters:  02/02/23 (!) 408 lb (185.1 kg)  12/18/22 (!) 393 lb (178.3 kg)  12/12/22 (!) 405 lb 4.8 oz (183.8 kg)      Objective:  Physical Exam Vitals reviewed.  Constitutional:      General: She is not in acute distress.    Appearance: Normal appearance. She is obese.  Cardiovascular:     Rate and Rhythm: Normal rate and regular rhythm.     Pulses: Normal pulses.     Heart sounds: Normal heart sounds. No murmur heard. Pulmonary:     Effort: Pulmonary effort is normal. No respiratory distress.     Breath sounds: Normal breath sounds. No wheezing.  Musculoskeletal:        General: No tenderness. Normal range of motion.  Skin:    General: Skin is warm and dry.     Capillary Refill: Capillary refill takes less than 2 seconds.     Coloration: Skin is not jaundiced.  Neurological:     General: No focal deficit present.     Mental Status: She is alert and oriented to person, place, and time.     Cranial Nerves: No cranial nerve deficit.     Motor: No weakness.  Psychiatric:        Mood and Affect: Mood normal.        Behavior: Behavior normal.        Thought Content: Thought content normal.        Judgment: Judgment normal.         Assessment And Plan:  Controlled type 2 diabetes mellitus without complication, with long-term current use of insulin (HCC) Assessment & Plan: Chronic, improving. Continue with current medications Encouraged to limit intake of sugary foods and drinks Encouraged to increase physical activity to 150 minutes per week Diabetic foot exam done, no abnormal findings Eye exam is up to date Suggestion is made for her to avoid simple carbohydrates from her diet including  Cakes, Sweet Desserts, Ice Cream, Soda (diet and regular), Sweet Tea, Candies, Chips, Cookies, Store Bought Juices, Alcohol in Excess of 1-2 drinks a day, Artificial  Sweeteners, Coffee Creamer, and "Sugar-free" Products. This will help patient to have more stable blood glucose    Orders: -     Hemoglobin A1c -     Mounjaro; Inject 7.5 mg into the skin once a week.  Dispense: 2 mL; Refill: 3  Essential hypertension Assessment & Plan: B/P is fairly controlled, advised to focus on lifestyle modification CMP ordered to check renal function.  The importance of regular exercise and dietary modification was stressed to the patient.  Stressed importance of losing ten percent of her body weight to help with B/P control.  The weight loss would help with decreasing cardiac and cancer risk as well.    Reactive depression Assessment & Plan: Father passed away from pancreatic cancer since her last visit.     Mixed hyperlipidemia Assessment & Plan: Cholesterol levels are stable.  Continue statin.  Orders: -     Lipid panel  Grief Assessment & Plan: Recent loss of her father from pancreatic cancer.   Family history of pancreatic cancer Assessment & Plan: Will refer to genetic specialist for evaluation.  Orders: -     Ambulatory referral to Genetics  Class 3 severe obesity due to excess calories with serious comorbidity and body mass index (BMI) greater than or equal to 70 in adult [facility]) Assessment & Plan: She is encouraged to strive for BMI less than 30 to decrease cardiac risk. Advised to aim for at least 150 minutes of exercise per week. Will refer to bariatric clinic as she is struggling to lose weight.  I do feel she would benefit from bariatric surgery. She has gained approximately 15 pounds since her last visit 6 weeks ago.  Orders: -     Amb Referral to Bariatric Surgery  Type 2 diabetes mellitus with hyperlipidemia (HCC) Assessment & Plan: Chronic, improving. Continue with current medications Encouraged to limit intake of sugary foods and drinks Encouraged to increase physical activity to 150 minutes per week Diabetic foot exam  done, no abnormal findings Eye exam is up to date Suggestion is made for her to avoid simple carbohydrates from her diet including Cakes, Sweet Desserts, Ice Cream, Soda (diet and regular), Sweet Tea, Candies, Chips, Cookies, Store Bought Juices, Alcohol in Excess of 1-2 drinks a day, Artificial Sweeteners, Coffee Creamer, and "Sugar-free" Products. This will help patient to have more stable blood glucose       Return for 4 month bp & dm .  Patient was given opportunity to ask questions. Patient verbalized understanding of the plan and was able to repeat key elements of the plan. All questions were answered to their satisfaction.    Emily Sparrow, FNP, have reviewed all documentation for this visit. The documentation on 02/02/23 for the exam, diagnosis, procedures, and orders are all accurate and complete.   IF YOU HAVE BEEN REFERRED TO A SPECIALIST, IT MAY TAKE 1-2 WEEKS TO SCHEDULE/PROCESS THE REFERRAL. IF YOU HAVE NOT HEARD FROM US/SPECIALIST IN TWO WEEKS, PLEASE GIVE [place] A CALL AT [phone].

## 2023-02-03 LAB — LIPID PANEL
Chol/HDL Ratio: 3.6 ratio (ref 0.0–4.4)
Cholesterol, Total: 143 mg/dL (ref 100–199)
HDL: 40 mg/dL (ref >39)
LDL Chol Calc (NIH): 81 mg/dL (ref 0–99)
Triglycerides: 125 mg/dL (ref 0–149)
VLDL Cholesterol Cal: 22 mg/dL (ref 5–40)

## 2023-02-03 LAB — HEMOGLOBIN A1C
Est. average glucose Bld gHb Est-mCnc: 126 mg/dL
Hgb A1c MFr Bld: 6 % — ABNORMAL HIGH (ref 4.8–5.6)

## 2023-02-11 DIAGNOSIS — Z800 Family history of malignant neoplasm of digestive organs: Secondary | ICD-10-CM | POA: Insufficient documentation

## 2023-02-11 DIAGNOSIS — F4321 Adjustment disorder with depressed mood: Secondary | ICD-10-CM | POA: Insufficient documentation

## 2023-02-11 NOTE — Assessment & Plan Note (Signed)
Cholesterol levels are stable.  Continue statin.

## 2023-02-11 NOTE — Assessment & Plan Note (Signed)
Will refer to genetic specialist for evaluation.

## 2023-02-11 NOTE — Assessment & Plan Note (Signed)
Chronic, improving. Continue with current medications Encouraged to limit intake of sugary foods and drinks Encouraged to increase physical activity to 150 minutes per week Diabetic foot exam done, no abnormal findings Eye exam is up to date Suggestion is made for her to avoid simple carbohydrates from her diet including Cakes, Sweet Desserts, Ice Cream, Soda (diet and regular), Sweet Tea, Candies, Chips, Cookies, Store Bought Juices, Alcohol in Excess of 1-2 drinks a day, Artificial Sweeteners, Coffee Creamer, and "Sugar-free" Products. This will help patient to have more stable blood glucose

## 2023-02-11 NOTE — Assessment & Plan Note (Signed)
Recent loss of her father from pancreatic cancer.

## 2023-02-11 NOTE — Assessment & Plan Note (Signed)
B/P is fairly controlled, advised to focus on lifestyle modification CMP ordered to check renal function.  The importance of regular exercise and dietary modification was stressed to the patient.  Stressed importance of losing ten percent of her body weight to help with B/P control.  The weight loss would help with decreasing cardiac and cancer risk as well.

## 2023-02-11 NOTE — Assessment & Plan Note (Addendum)
She is encouraged to strive for BMI less than 30 to decrease cardiac risk. Advised to aim for at least 150 minutes of exercise per week. Will refer to bariatric clinic as she is struggling to lose weight.  I do feel she would benefit from bariatric surgery. She has gained approximately 15 pounds since her last visit 6 weeks ago.

## 2023-02-11 NOTE — Assessment & Plan Note (Signed)
Father passed away from pancreatic cancer since her last visit.

## 2023-02-18 ENCOUNTER — Telehealth: Payer: Self-pay

## 2023-02-18 NOTE — Telephone Encounter (Signed)
Left pt vm to let her know her forms are completed she just needs to come in and fill out her portion. YL,RMA

## 2023-02-27 ENCOUNTER — Other Ambulatory Visit: Payer: Self-pay | Admitting: Nurse Practitioner

## 2023-03-13 ENCOUNTER — Other Ambulatory Visit: Payer: Self-pay | Admitting: Nurse Practitioner

## 2023-03-18 ENCOUNTER — Inpatient Hospital Stay: Payer: No Typology Code available for payment source | Attending: Genetic Counselor | Admitting: Genetic Counselor

## 2023-03-18 ENCOUNTER — Inpatient Hospital Stay: Payer: No Typology Code available for payment source

## 2023-03-18 ENCOUNTER — Other Ambulatory Visit: Payer: Self-pay | Admitting: Genetic Counselor

## 2023-03-18 ENCOUNTER — Other Ambulatory Visit: Payer: Self-pay | Admitting: Nurse Practitioner

## 2023-03-18 DIAGNOSIS — Z8042 Family history of malignant neoplasm of prostate: Secondary | ICD-10-CM | POA: Diagnosis not present

## 2023-03-18 DIAGNOSIS — E119 Type 2 diabetes mellitus without complications: Secondary | ICD-10-CM

## 2023-03-18 DIAGNOSIS — Z800 Family history of malignant neoplasm of digestive organs: Secondary | ICD-10-CM

## 2023-03-18 DIAGNOSIS — Z8051 Family history of malignant neoplasm of kidney: Secondary | ICD-10-CM | POA: Diagnosis not present

## 2023-03-18 DIAGNOSIS — Z803 Family history of malignant neoplasm of breast: Secondary | ICD-10-CM | POA: Diagnosis not present

## 2023-03-18 LAB — GENETIC SCREENING ORDER

## 2023-03-19 ENCOUNTER — Encounter: Payer: Self-pay | Admitting: Genetic Counselor

## 2023-03-19 DIAGNOSIS — Z8042 Family history of malignant neoplasm of prostate: Secondary | ICD-10-CM | POA: Insufficient documentation

## 2023-03-19 DIAGNOSIS — Z8051 Family history of malignant neoplasm of kidney: Secondary | ICD-10-CM | POA: Insufficient documentation

## 2023-03-19 DIAGNOSIS — Z803 Family history of malignant neoplasm of breast: Secondary | ICD-10-CM | POA: Insufficient documentation
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
## 2023-03-27 ENCOUNTER — Other Ambulatory Visit: Payer: Self-pay | Admitting: Nurse Practitioner

## 2023-03-31 ENCOUNTER — Telehealth: Payer: Self-pay | Admitting: Genetic Counselor

## 2023-03-31 ENCOUNTER — Encounter: Payer: Self-pay | Admitting: Genetic Counselor

## 2023-03-31 DIAGNOSIS — Z1379 Encounter for other screening for genetic and chromosomal anomalies: Secondary | ICD-10-CM | POA: Insufficient documentation

## 2023-03-31 NOTE — Telephone Encounter (Signed)
LM on VM that results are back and to please call.  Left CB instructions.

## 2023-04-01 NOTE — Telephone Encounter (Signed)
Left second message on VM.  Left CB instructions.

## 2023-04-01 NOTE — Telephone Encounter (Signed)
Revealed negative genetic testing.  Discussed that we do not know why there is cancer in the family. It could be due to a different gene that we are not testing, or maybe our current technology may not be able to pick something up.  It will be important for her to keep in contact with genetics to keep up with whether additional testing may be needed.

## 2023-04-02 ENCOUNTER — Ambulatory Visit: Payer: Self-pay | Admitting: Genetic Counselor

## 2023-04-02 DIAGNOSIS — Z1379 Encounter for other screening for genetic and chromosomal anomalies: Secondary | ICD-10-CM

## 2023-04-02 NOTE — Progress Notes (Signed)
HPI:  [name] was previously seen in the [facility] due to a family history of cancer and concerns regarding a hereditary predisposition to cancer. Please refer to our prior cancer genetics clinic note for more information regarding our discussion, assessment and recommendations, at the time. [name] recent genetic test results were disclosed to her, as were recommendations warranted by these results. These results and recommendations are discussed in more detail below.  CANCER HISTORY:  Oncology History   No history exists.    FAMILY HISTORY:  We obtained a detailed, 4-generation family history.  Significant diagnoses are listed below: Family History  Problem Relation Age of Onset   Hypertension Mother    Colon polyps Mother    Diabetes Father    Hypertension Father    Prostate cancer Father 64   Pancreatic cancer Father 9   Hypertension Brother    Thyroid disease Maternal Aunt    Heart disease Maternal Uncle    Kidney failure Maternal Uncle    Cancer Paternal Aunt        pat 1/2 aunt with cancer NOS   Pancreatic cancer Paternal Uncle    Hypertension Maternal Grandmother    Heart disease Maternal Grandmother    Hypertrophic cardiomyopathy Maternal Grandmother    Breast cancer Cousin        dx < 50   Colon cancer Neg Hx    Esophageal cancer Neg Hx    Liver cancer Neg Hx    Stomach cancer Neg Hx    Rectal cancer Neg Hx        The patient does not have children.  She has two brothers and a sister who are cancer free.  Her mother is living and her father is deceased.   The patient's mother was adopted.  She had two brothers and a sister, the brother had kidney cancer.  The parents are deceased.   The patient's father had prostate cancer at 34 and died of pancreatic cancer at 72.  He had five full sisters and four brothers, and a maternal half sister.  The half sister had an unknown cancer and her daughter had breast cancer.  A full brother  has pancreatic cancer.   [name] is unaware of previous family history of genetic testing for hereditary cancer risks. There is no reported Ashkenazi Jewish ancestry. There is no known consanguinity  GENETIC TEST RESULTS: Genetic testing reported out on March 29, 2023 through the CancerNext-Expanded+RNAinsight cancer panel found no pathogenic mutations. The CancerNext-Expanded gene panel offered by [facility] and includes sequencing and rearrangement analysis for the following 77 genes: AIP, ALK, APC*, ATM*, AXIN2, BAP1, BARD1, BMPR1A, BRCA1*, BRCA2*, BRIP1*, CDC73, CDH1*, CDK4, CDKN1B, CDKN2A, CHEK2*, CTNNA1, DICER1, FH, FLCN, KIF1B, LZTR1, MAX, MEN1, MET, MLH1*, MSH2*, MSH3, MSH6*, MUTYH*, NF1*, NF2, NTHL1, PALB2*, PHOX2B, PMS2*, POT1, PRKAR1A, PTCH1, PTEN*, RAD51C*, RAD51D*, RB1, RET, SDHA, SDHAF2, SDHB, SDHC, SDHD, SMAD4, SMARCA4, SMARCB1, SMARCE1, STK11, SUFU, TMEM127, TP53*, TSC1, TSC2, and VHL (sequencing and deletion/duplication); EGFR, EGLN1, HOXB13, KIT, MITF, PDGFRA, POLD1, and POLE (sequencing only); EPCAM and GREM1 (deletion/duplication only). DNA and RNA analyses performed for * genes. The test report has been scanned into EPIC and is located under the Molecular Pathology section of the Results Review tab.  A portion of the result report is included below for reference.     We discussed with [name] that because current genetic testing is not perfect, it is possible there may be a gene mutation in one  of these genes that current testing cannot detect, but that chance is small.  We also discussed, that there could be another gene that has not yet been discovered, or that we have not yet tested, that is responsible for the cancer diagnoses in the family. It is also possible there is a hereditary cause for the cancer in the family that [name] did not inherit and therefore was not identified in her testing.  Therefore, it is important to remain in touch with cancer genetics  in the future so that we can continue to offer [name] the most up to date genetic testing.   ADDITIONAL GENETIC TESTING: We discussed with [name] that her genetic testing was fairly extensive.  If there are genes identified to increase cancer risk that can be analyzed in the future, we would be happy to discuss and coordinate this testing at that time.    CANCER SCREENING RECOMMENDATIONS: [name] test result is considered negative (normal).  This means that we have not identified a hereditary cause for her family history of cancer at this time. Most cancers happen by chance and this negative test suggests that her family history of cancer may fall into this category.    Possible reasons for [name]'s negative genetic test include:  1. There may be a gene mutation in one of these genes that current testing methods cannot detect but that chance is small.  2. There could be another gene that has not yet been discovered, or that we have not yet tested, that is responsible for the cancer diagnoses in the family.  3.  There may be no hereditary risk for cancer in the family. The cancers in [name] and/or her family may be sporadic/familial or due to other genetic and environmental factors. 4. It is also possible there is a hereditary cause for the cancer in the family that [name] did not inherit.  Therefore, it is recommended she continue to follow the cancer management and screening guidelines provided by her primary healthcare provider. An individual's cancer risk and medical management are not determined by genetic test results alone. Overall cancer risk assessment incorporates additional factors, including personal medical history, family history, and any available genetic information that may result in a personalized plan for cancer prevention and surveillance  RECOMMENDATIONS FOR FAMILY MEMBERS:  Individuals in this family might be at some increased risk of developing  cancer, over the general population risk, simply due to the family history of cancer.  We recommended women in this family have a yearly mammogram beginning at age 15, or 29 years younger than the earliest onset of cancer, an annual clinical breast exam, and perform monthly breast self-exams. Women in this family should also have a gynecological exam as recommended by their primary provider. All family members should be referred for colonoscopy starting at age 70.  It is also possible there is a hereditary cause for the cancer in [name]'s family that she did not inherit and therefore was not identified in her.  Based on [name]'s family history, we recommended her cousin, who was diagnosed with breast cancer under age 74, have genetic counseling and testing. [name] will let us know if we can be of any assistance in coordinating genetic counseling and/or testing for this family member.   FOLLOW-UP: Lastly, we discussed with [name] that cancer genetics is a rapidly advancing field and it is possible that new genetic tests will be appropriate for her and/or  her family members in the future. We encouraged her to remain in contact with cancer genetics on an annual basis so we can update her personal and family histories and let her know of advances in cancer genetics that may benefit this family.   Our contact number was provided. [name] questions were answered to her satisfaction, and she knows she is welcome to call us at anytime with additional questions or concerns.   Maylon Cos, MS, [facility] Licensed, Certified Genetic Counselor [email or web address]

## 2023-04-21 ENCOUNTER — Other Ambulatory Visit: Payer: Self-pay

## 2023-04-21 MED ORDER — DEXCOM G6 TRANSMITTER MISC: 3 refills | Status: DC

## 2023-04-22 ENCOUNTER — Other Ambulatory Visit (HOSPITAL_BASED_OUTPATIENT_CLINIC_OR_DEPARTMENT_OTHER): Payer: Self-pay | Admitting: Cardiovascular Disease

## 2023-05-12 ENCOUNTER — Other Ambulatory Visit (HOSPITAL_BASED_OUTPATIENT_CLINIC_OR_DEPARTMENT_OTHER): Payer: Self-pay | Admitting: Cardiovascular Disease

## 2023-05-19 LAB — BASIC METABOLIC PANEL
BUN: 11 (ref 4–21)
Chloride: 102 (ref 99–108)
Creatinine: 0.8 (ref 0.5–1.1)
Glucose: 110
Potassium: 4.7 meq/L (ref 3.5–5.1)
Sodium: 139 (ref 137–147)

## 2023-05-19 LAB — COMPREHENSIVE METABOLIC PANEL
Albumin: 4.1 (ref 3.5–5.0)
Calcium: 9.5 (ref 8.7–10.7)
Globulin: 2.7

## 2023-05-19 LAB — PROTEIN / CREATININE RATIO, URINE: Albumin, U: 0

## 2023-05-19 LAB — LIPID PANEL
Cholesterol: 156 (ref 0–200)
HDL: 39 (ref 35–70)
LDL Cholesterol: 95
Triglycerides: 123 (ref 40–160)

## 2023-05-19 LAB — HEPATIC FUNCTION PANEL
ALT: 20 U/L (ref 7–35)
AST: 17 (ref 13–35)
Alkaline Phosphatase: 93 (ref 25–125)
Bilirubin, Total: 0.4

## 2023-05-19 LAB — CBC AND DIFFERENTIAL
HCT: 37 (ref 36–46)
Hemoglobin: 11.2 — AB (ref 12.0–16.0)
WBC: 9.4

## 2023-05-19 LAB — TSH: TSH: 1.53 (ref 0.41–5.90)

## 2023-05-19 LAB — CBC: RBC: 4.73 (ref 3.87–5.11)

## 2023-05-22 ENCOUNTER — Encounter: Payer: Self-pay | Admitting: Physician Assistant

## 2023-06-01 ENCOUNTER — Other Ambulatory Visit: Payer: Self-pay | Admitting: *Deleted

## 2023-06-01 DIAGNOSIS — K219 Gastro-esophageal reflux disease without esophagitis: Secondary | ICD-10-CM

## 2023-06-01 MED ORDER — OMEPRAZOLE 40 MG PO CPDR: 40.0000 mg | DELAYED_RELEASE_CAPSULE | Freq: Two times a day (BID) | ORAL | 3 refills | Status: DC

## 2023-06-02 ENCOUNTER — Other Ambulatory Visit: Payer: Self-pay

## 2023-06-02 ENCOUNTER — Ambulatory Visit: Payer: No Typology Code available for payment source | Admitting: Family Medicine

## 2023-06-02 ENCOUNTER — Encounter: Payer: Self-pay | Admitting: Family Medicine

## 2023-06-02 DIAGNOSIS — Z113 Encounter for screening for infections with a predominantly sexual mode of transmission: Secondary | ICD-10-CM

## 2023-06-02 LAB — HM HIV SCREENING LAB: HM HIV Screening: NEGATIVE

## 2023-06-02 LAB — WET PREP FOR TRICH, YEAST, CLUE
Trichomonas Exam: NEGATIVE
Yeast Exam: NEGATIVE

## 2023-06-02 LAB — HM HEPATITIS C SCREENING LAB: HM Hepatitis Screen: NEGATIVE

## 2023-06-02 LAB — HEPATITIS B SURFACE ANTIGEN

## 2023-06-02 NOTE — Progress Notes (Signed)
Pt is here for STD visit.  Wet prep results reviewed with pt, no treatment required per standing order.  Condoms given.  Gaspar Garbe, RN

## 2023-06-02 NOTE — Progress Notes (Signed)
[facility] Department  STI clinic/screening visit [address] [place] [address] [phone]  Subjective:  Emily Phelps is a 42 y.o. female being seen today for an STI screening visit. The patient reports they do not have symptoms.  Patient reports that they do not desire a pregnancy in the next year.   They reported they are not interested in discussing contraception today.    Patient's last menstrual period was 05/19/2023 (exact date).  Patient has the following medical conditions:   Patient Active Problem List   Diagnosis Date Noted   Genetic testing 03/31/2023   Family history of prostate cancer    Family history of kidney cancer    Family history of breast cancer    Grief 02/11/2023   Family history of pancreatic cancer 02/11/2023   Class 3 severe obesity due to excess calories with serious comorbidity and body mass index (BMI) greater than or equal to 70 in adult (HCC) 02/11/2023   Left leg swelling 12/02/2022   Closed fracture of right lower extremity 12/02/2022   Urine frequency 12/02/2022   Reactive depression 12/02/2022   Body mass index (BMI) 60.0-69.9, adult (HCC) 09/08/2022   Status post parathyroidectomy 11/20/2021   Hyperparathyroidism, primary (HCC) 11/06/2021   Palpitations 05/13/2021   Family history of hypertrophic cardiomyopathy 05/13/2021   Morbid obesity with BMI of 60.0-69.9, adult (HCC) 380 lbs 01/04/2020   OSA (obstructive sleep apnea) 10/08/2019   Type 2 diabetes mellitus with hyperlipidemia (HCC) 05/20/2019   Numbness and tingling in left hand 05/20/2019   Hypokalemia    Dyspnea 03/11/2019   Panic anxiety syndrome 03/03/2019   Neck pain 01/07/2019   Essential hypertension 11/28/2014   GERD (gastroesophageal reflux disease) 11/28/2014   Migraines 11/28/2014   Hyperlipidemia 11/28/2014   Allergic rhinitis 11/28/2014    Chief Complaint  Patient presents with   SEXUALLY TRANSMITTED DISEASE    STD Screening with no  symptoms    Patient presents to clinic requesting asymptomatic STI screening.    Patient reports 1 female partner in the last 2 months, practicing vaginal and oral sex, and states the last sexual encounter there was accidental anal/penile insertion. She reports using POP for contraception. Reports a history of gonorrhea and chlamydia when she was a teenager maybe 15 years prior.   Does the patient using douching products? No  Last HIV test per patient/review of record was  Lab Results  Component Value Date   HMHIVSCREEN Negative - Validated 03/06/2022    Lab Results  Component Value Date   HIV Non Reactive 03/12/2019     Last HEPC test per patient/review of record was  Lab Results  Component Value Date   HMHEPCSCREEN Negative-Validated 03/06/2022   No components found for: "HEPC"   Last HEPB test per patient/review of record was No components found for: "HMHEPBSCREEN" No components found for: "HEPC"   Patient reports last pap was No results found for: "DIAGPAP" No results found for: "SPECADGYN"  Screening for MPX risk: Does the patient have an unexplained rash? No Is the patient MSM? No Does the patient endorse multiple sex partners or anonymous sex partners? No Did the patient have close or sexual contact with a person diagnosed with MPX? No Has the patient traveled outside the [place] where MPX is endemic? No Is there a high clinical suspicion for MPX-- evidenced by one of the following No  -Unlikely to be chickenpox  -Lymphadenopathy  -Rash that present in same phase of evolution on any given  body part See flowsheet for further details and programmatic requirements.   Immunization history:  Immunization History  Administered Date(s) Administered   Influenza,inj,Quad PF,6+ Mos 06/22/2020, 04/03/2021, 04/09/2022   MMR 03/06/1988, 01/28/2002   PFIZER(Purple Top)SARS-COV-2 Vaccination 03/30/2020, 04/20/2020, 05/13/2021   Pneumococcal Polysaccharide-23 01/20/2020   Tdap  06/16/2012, 09/30/2022     The following portions of the patient's history were reviewed and updated as appropriate: allergies, current medications, past medical history, past social history, past surgical history and problem list.  Objective:  There were no vitals filed for this visit.  Physical Exam Nursing note reviewed. Exam conducted with a chaperone present Ike Bene, CNA present in room as chaperone.).  Constitutional:      Appearance: Normal appearance.  HENT:     Head: Normocephalic and atraumatic.     Salivary Glands: Right salivary gland is not diffusely enlarged or tender. Left salivary gland is not diffusely enlarged or tender.     Mouth/Throat:     Lips: Pink. No lesions.     Mouth: Mucous membranes are moist.     Tongue: No lesions. Tongue does not deviate from midline.     Pharynx: Oropharynx is clear. Uvula midline. No oropharyngeal exudate or posterior oropharyngeal erythema.     Tonsils: No tonsillar exudate.  Eyes:     General:        Right eye: No discharge.        Left eye: No discharge.  Pulmonary:     Effort: Pulmonary effort is normal.  Genitourinary:    General: Normal vulva.     Exam position: Lithotomy position.     Pubic Area: No rash or pubic lice.      Tanner stage (genital): 5.     Labia:        Right: No rash, tenderness, lesion or injury.        Left: No rash, tenderness, lesion or injury.      Vagina: Normal. No signs of injury. No vaginal discharge, erythema, tenderness, bleeding or lesions.     Cervix: Normal. No cervical motion tenderness, discharge, friability, lesion, erythema or cervical bleeding.     Uterus: Normal.      Adnexa: Right adnexa normal and left adnexa normal.     Rectum: Normal.     Comments: pH = <4.5 No abnormal discharge present.  Lymphadenopathy:     Head:     Right side of head: No submental, submandibular, tonsillar, preauricular or posterior auricular adenopathy.     Left side of head: No submental,  submandibular, tonsillar, preauricular or posterior auricular adenopathy.     Cervical: No cervical adenopathy.     Right cervical: No superficial or posterior cervical adenopathy.    Left cervical: No superficial or posterior cervical adenopathy.     Upper Body:     Right upper body: No supraclavicular or axillary adenopathy.     Left upper body: No supraclavicular or axillary adenopathy.     Lower Body: No right inguinal adenopathy. No left inguinal adenopathy.  Skin:    General: Skin is warm and dry.     Findings: No rash.     Comments: Skin tone appropriate for ethnicity.   Neurological:     Mental Status: She is alert and oriented to person, place, and time.  Psychiatric:        Attention and Perception: Attention normal.        Mood and Affect: Mood normal.        Speech:  Speech normal.        Behavior: Behavior normal. Behavior is cooperative.        Thought Content: Thought content normal.    Assessment and Plan:  Keyanni Avey is a 43 y.o. female presenting to the [facility] Department for STI screening  1. Screening examination for venereal disease Testing performed per patient request and protocol.  - HBV Antigen/Antibody [facility] - HIV/HCV [facility] - Syphilis Serology, [address] [facility] - Gonococcus culture (pharynx) - Gonococcus culture (rectal) - Chlamydia/Gonorrhea [facility] - WET PREP FOR TRICH, YEAST, CLUE   Patient accepted all screenings including oral, vaginal CT/GC and bloodwork for HIV/RPR, and wet prep. Patient meets criteria for HepB screening? Yes. Ordered? yes Patient meets criteria for HepC screening? Yes. Ordered? yes  Treat wet prep per standing order Discussed time line for State Lab results and that patient will be called with positive results and encouraged patient to call if she had not heard in 2 weeks.  Counseled to return or seek care for continued or worsening symptoms Recommended repeat testing in 3 months with positive  results. Recommended condom use with all sex  Patient is currently using  POP  to prevent pregnancy.    Return if symptoms worsen or fail to improve.  Future Appointments  Date Time Provider Department Center  06/09/2023 11:20 AM [name], FNP [facility] None  06/19/2023  8:20 AM Chilton Si, MD [facility]  06/29/2023 10:50 AM [facility] 2 [facility]    Edmonia James, NP

## 2023-06-02 NOTE — Progress Notes (Signed)
Attestation of Supervision of Advanced Practitioner (CNM/PA/NP): Evaluation and management procedures were performed by the Advanced Practice Provider under my supervision and collaboration.  I have reviewed the Advanced Practice Provider's note and chart, and I agree with the management and plan. I have also made any necessary editorial changes.   I was working along side this practitioner all day and all medical plans were discussed with me.   Lenice Llamas, [place]

## 2023-06-05 ENCOUNTER — Encounter: Payer: Self-pay | Admitting: Family Medicine

## 2023-06-07 LAB — GONOCOCCUS CULTURE

## 2023-06-08 ENCOUNTER — Ambulatory Visit: Payer: No Typology Code available for payment source | Admitting: Nurse Practitioner

## 2023-06-08 NOTE — Progress Notes (Addendum)
Emily Phelps, CMA,acting as a Neurosurgeon for Emily Felts, FNP.,have documented all relevant documentation on the behalf of Emily Felts, FNP,as directed by  Emily Felts, FNP while in the presence of Emily Felts, FNP.  Subjective:  Patient ID: Emily Phelps , female    DOB: Nov 26, 1980 , 42 y.o.   MRN: [id number]  Chief Complaint  Patient presents with   Hypertension   Diabetes    HPI  Patient presents today for a bp and dm follow up, Patient reports compliance with medication. Patient denies any chest pain, SOB, or headaches. Patient has no concerns today. She is going to [facility] and is planning to have gastric bypass. She is having 3 random drug screens. She had been drinking about 2 drinks a day when she made the visit to behavioral health at the bariatric clinic. She has to do 12 meetings for her insurance. She also has seen [facility].      Past Medical History:  Diagnosis Date   Abscess of skin of abdomen 05/15/2022   She has a healing wound to her abdomen, treated with antibiotic. Advised to continue antibiotics until completely gone.   Allergy    Diabetes mellitus (HCC)    Family history of adverse reaction to anesthesia    mother had n/v after    Family history of breast cancer    Family history of hypertrophic cardiomyopathy 05/13/2021   Family history of kidney cancer    Family history of pancreatic cancer    Family history of prostate cancer    GERD (gastroesophageal reflux disease)    Hyperlipidemia    Hypertension    Legionella pneumonia (HCC) 01/03/2020   Migraine    Obesity    Palpitations 05/13/2021   Pneumonia    Rectal bleeding    Resistant hypertension 11/28/2014   Sleep apnea    Wears contact lenses      Family History  Problem Relation Age of Onset   Hypertension Mother    Colon polyps Mother    Diabetes Father    Hypertension Father    Prostate cancer Father 74   Pancreatic cancer Father 39   Hypertension Brother     Thyroid disease Maternal Aunt    Heart disease Maternal Uncle    Kidney failure Maternal Uncle    Cancer Paternal Aunt        pat 1/2 aunt with cancer NOS   Pancreatic cancer Paternal Uncle    Hypertension Maternal Grandmother    Heart disease Maternal Grandmother    Hypertrophic cardiomyopathy Maternal Grandmother    Breast cancer Cousin        dx < 50   Colon cancer Neg Hx    Esophageal cancer Neg Hx    Liver cancer Neg Hx    Stomach cancer Neg Hx    Rectal cancer Neg Hx      Current Outpatient Medications:    Magnesium Glycinate 100 MG CAPS, Take 1 capsule by mouth every evening., Disp: 30 capsule, Rfl: 3   albuterol (VENTOLIN HFA) 108 (90 Base) MCG/ACT inhaler, Inhale 2 puffs into the lungs every 6 (six) hours as needed for wheezing or shortness of breath., Disp: 6.7 g, Rfl: 0   ALLERGY RELIEF 180 MG tablet, TAKE 1 TABLET BY MOUTH EVERY DAY, Disp: 90 tablet, Rfl: 0   amLODipine (NORVASC) 10 MG tablet, TAKE 1 TABLET BY MOUTH ONCE DAILY, Disp: 90 tablet, Rfl: 3   atenolol (TENORMIN) 100 MG tablet, TAKE 1 TABLET BY  MOUTH EVERY DAY, Disp: 90 tablet, Rfl: 3   atorvastatin (LIPITOR) 10 MG tablet, TAKE 1 TABLET BY MOUTH EVERY DAY, Disp: 30 tablet, Rfl: 2   Continuous Blood Gluc Receiver (DEXCOM G6 RECEIVER) DEVI, Use to check blood sugars dx code e11.65, Disp: 3 each, Rfl: 3   Continuous Glucose Sensor (DEXCOM G6 SENSOR) MISC, USE TO CHECK BLOOD SUGAR AND CHANGE EVERY 10 DAYS, Disp: 3 each, Rfl: 5   Continuous Glucose Transmitter (DEXCOM G6 TRANSMITTER) MISC, USE TO CHECK BLOOD SUGAR. CHANGE EVERY 90 DAYS, Disp: 1 each, Rfl: 3   cyclobenzaprine (FLEXERIL) 10 MG tablet, Take 1 tablet (10 mg total) by mouth 3 (three) times daily as needed for muscle spasms., Disp: 30 tablet, Rfl: 0   dicyclomine (BENTYL) 10 MG capsule, TAKE 1 CAPSULE BY MOUTH 4 TIMES DAILY AS NEEDED FOR SPASMS, Disp: 30 capsule, Rfl: 2   FARXIGA 5 MG TABS tablet, TAKE 1 TABLET BY MOUTH EVERY DAY, Disp: 90 tablet, Rfl: 0    Ketoprofen (FROTEK) 10 % CREA, Apply 1 application  topically 4 (four) times daily., Disp: , Rfl:    Lancets (UNILET COMFORTOUCH LANCET) MISC, check blood sugar 3 TIMES DAILY AS DIRECTED, Disp: 200 each, Rfl: 1   Multiple Vitamin (MULTIVITAMIN WITH MINERALS) TABS tablet, Take 1 tablet by mouth daily., Disp: , Rfl:    mupirocin ointment (BACTROBAN) 2 %, Apply 1 Application topically 2 (two) times daily., Disp: 22 g, Rfl: 0   omeprazole (PRILOSEC) 40 MG capsule, Take 1 capsule (40 mg total) by mouth 2 (two) times daily., Disp: 180 capsule, Rfl: 3   ondansetron (ZOFRAN) 4 MG tablet, Take 1 tablet (4 mg total) by mouth every 4 (four) hours as needed for nausea or vomiting., Disp: 6 tablet, Rfl: 0   progesterone (PROMETRIUM) 100 MG capsule, Take 200 mg by mouth at bedtime., Disp: , Rfl:    spironolactone (ALDACTONE) 50 MG tablet, TAKE 1 TABLET BY MOUTH EVERY DAY, Disp: 90 tablet, Rfl: 1   SUMAtriptan (IMITREX) 50 MG tablet, TAKE 1 TABLET BY MOUTH EVERY 2 HOURS AS NEEDED FOR MIGRAINE. MAY REPEAT in 2 hours IF HEADACHE persists OR recurs, Disp: 10 tablet, Rfl: 1   tirzepatide (MOUNJARO) 15 MG/0.5ML Pen, Inject 15 mg into the skin once a week., Disp: 6 mL, Rfl: 1   topiramate (TOPAMAX) 50 MG tablet, TAKE 1 TABLET BY MOUTH 2 TIMES DAILY, Disp: 180 tablet, Rfl: 1   traMADol (ULTRAM) 50 MG tablet, Take 1-2 tablets (50-100 mg total) by mouth every 6 (six) hours as needed for moderate pain., Disp: 15 tablet, Rfl: 0   valsartan (DIOVAN) 320 MG tablet, TAKE 1 TABLET BY MOUTH EVERY DAY, Disp: 90 tablet, Rfl: 3   Vitamin D, Ergocalciferol, (DRISDOL) 1.25 MG (50000 UNIT) CAPS capsule, TAKE 1 CAPSULE BY MOUTH EVERY 7 DAYS, Disp: 12 capsule, Rfl: 1   Allergies  Allergen Reactions   Latex Rash   Silicone Rash    Other reaction(s): Unknown   Tape Rash    Other reaction(s): Unknown Other reaction(s): Unknown     Review of Systems  Constitutional: Negative.   HENT: Negative.    Eyes: Negative.   Respiratory:  Negative.    Cardiovascular: Negative.  Negative for chest pain, palpitations and leg swelling.  Gastrointestinal: Negative.  Negative for abdominal pain.  Neurological: Negative.  Negative for headaches.  Psychiatric/Behavioral: Negative.       Today's Vitals   06/09/23 1130  BP: 128/70  Pulse: 85  Temp: 98.5 F (36.9  C)  TempSrc: Oral  Weight: (!) 410 lb 12.8 oz (186.3 kg)  Height: 5\' 4"  (1.626 m)  PainSc: 6   PainLoc: Knee   Body mass index is 70.51 kg/m.  Wt Readings from Last 3 Encounters:  06/19/23 (!) 412 lb 1.6 oz (186.9 kg)  06/09/23 (!) 410 lb 12.8 oz (186.3 kg)  02/02/23 (!) 408 lb (185.1 kg)     Objective:  Physical Exam Vitals reviewed.  Constitutional:      General: She is not in acute distress.    Appearance: Normal appearance. She is obese.  Cardiovascular:     Rate and Rhythm: Normal rate and regular rhythm.     Pulses: Normal pulses.     Heart sounds: Normal heart sounds. No murmur heard. Pulmonary:     Effort: Pulmonary effort is normal. No respiratory distress.     Breath sounds: Normal breath sounds. No wheezing.  Musculoskeletal:        General: No tenderness. Normal range of motion.  Skin:    General: Skin is warm and dry.     Capillary Refill: Capillary refill takes less than 2 seconds.     Coloration: Skin is not jaundiced.  Neurological:     General: No focal deficit present.     Mental Status: She is alert and oriented to person, place, and time.     Cranial Nerves: No cranial nerve deficit.     Motor: No weakness.  Psychiatric:        Mood and Affect: Mood normal.        Behavior: Behavior normal.        Thought Content: Thought content normal.        Judgment: Judgment normal.         Assessment And Plan:  Controlled type 2 diabetes mellitus without complication, with long-term current use of insulin (HCC) Assessment & Plan: HgbA1c is stable at 5.6. Will increase Mounjaro to 10 mg weekly.   Orders: -     Hemoglobin  A1c  Essential hypertension Assessment & Plan: B/P is well controlled, advised to focus on lifestyle modification    Anemia, unspecified type Assessment & Plan: Her CBC shows some anemia will check for iron deficiency.   Orders: -     Iron, TIBC and Ferritin Panel  Other insomnia Assessment & Plan: She is to take magnesium glycinate to help her to sleep.   Orders: -     Magnesium Glycinate; Take 1 capsule by mouth every evening.  Dispense: 30 capsule; Refill: 3  OSA on CPAP  Need for influenza vaccination Assessment & Plan: Influenza vaccine administered Encouraged to take Tylenol as needed for fever or muscle aches.   Orders: -     Flu vaccine trivalent PF, 6mos and older(Flulaval,Afluria,Fluarix,Fluzone)  Morbid obesity with BMI of 70 and over, adult [facility]) Assessment & Plan: She is going to [facility], she is going to 12 meetings and in the process of deciding on which weight loss surgery to have.    Type 2 diabetes mellitus with obesity (HCC) Assessment & Plan: HgbA1c is stable at 5.6. Will increase Mounjaro to 10 mg weekly.      Return for controlled DM check 4 months.  Patient was given opportunity to ask questions. Patient verbalized understanding of the plan and was able to repeat key elements of the plan. All questions were answered to their satisfaction.    Jeanell Sparrow, FNP, have reviewed all documentation for this visit.  The documentation on 06/23/23 for the exam, diagnosis, procedures, and orders are all accurate and complete.   IF YOU HAVE BEEN REFERRED TO A SPECIALIST, IT MAY TAKE 1-2 WEEKS TO SCHEDULE/PROCESS THE REFERRAL. IF YOU HAVE NOT HEARD FROM US/SPECIALIST IN TWO WEEKS, PLEASE GIVE [place] A CALL AT [phone].

## 2023-06-09 ENCOUNTER — Ambulatory Visit: Payer: No Typology Code available for payment source | Admitting: Nurse Practitioner

## 2023-06-09 ENCOUNTER — Encounter: Payer: Self-pay | Admitting: Nurse Practitioner

## 2023-06-09 VITALS — BP 128/70 | HR 85 | Temp 98.5°F | Ht 64.0 in | Wt >= 6400 oz

## 2023-06-09 DIAGNOSIS — Z23 Encounter for immunization: Secondary | ICD-10-CM | POA: Insufficient documentation

## 2023-06-09 DIAGNOSIS — G4709 Other insomnia: Secondary | ICD-10-CM

## 2023-06-09 DIAGNOSIS — I10 Essential (primary) hypertension: Secondary | ICD-10-CM

## 2023-06-09 DIAGNOSIS — E1169 Type 2 diabetes mellitus with other specified complication: Secondary | ICD-10-CM

## 2023-06-09 DIAGNOSIS — Z794 Long term (current) use of insulin: Secondary | ICD-10-CM

## 2023-06-09 DIAGNOSIS — D649 Anemia, unspecified: Secondary | ICD-10-CM | POA: Diagnosis not present

## 2023-06-09 DIAGNOSIS — Z6841 Body Mass Index (BMI) 40.0 and over, adult: Secondary | ICD-10-CM

## 2023-06-09 DIAGNOSIS — E119 Type 2 diabetes mellitus without complications: Secondary | ICD-10-CM | POA: Insufficient documentation

## 2023-06-09 DIAGNOSIS — E1165 Type 2 diabetes mellitus with hyperglycemia: Secondary | ICD-10-CM | POA: Diagnosis not present

## 2023-06-09 DIAGNOSIS — G4733 Obstructive sleep apnea (adult) (pediatric): Secondary | ICD-10-CM

## 2023-06-09 DIAGNOSIS — E669 Obesity, unspecified: Secondary | ICD-10-CM | POA: Insufficient documentation

## 2023-06-09 MED ORDER — TIRZEPATIDE 10 MG/0.5ML ~~LOC~~ SOAJ: 10.0000 mg | SUBCUTANEOUS | 5 refills | Status: DC

## 2023-06-09 MED ORDER — MAGNESIUM GLYCINATE 100 MG PO CAPS: 1.0000 | ORAL_CAPSULE | Freq: Every evening | ORAL | 3 refills | Status: DC

## 2023-06-09 NOTE — Assessment & Plan Note (Signed)
Influenza vaccine administered Encouraged to take Tylenol as needed for fever or muscle aches.

## 2023-06-09 NOTE — Assessment & Plan Note (Signed)
She is to take magnesium glycinate to help her to sleep.

## 2023-06-09 NOTE — Assessment & Plan Note (Signed)
Her CBC shows some anemia will check for iron deficiency.

## 2023-06-09 NOTE — Patient Instructions (Signed)
Take an over the counter magnesium gylcinate

## 2023-06-09 NOTE — Assessment & Plan Note (Signed)
She is going to [facility], she is going to 12 meetings and in the process of deciding on which weight loss surgery to have.

## 2023-06-09 NOTE — Assessment & Plan Note (Signed)
HgbA1c is stable at 5.6. Will increase Mounjaro to 10 mg weekly.

## 2023-06-09 NOTE — Assessment & Plan Note (Signed)
B/P is well controlled, advised to focus on lifestyle modification

## 2023-06-10 ENCOUNTER — Encounter: Payer: Self-pay | Admitting: Nurse Practitioner

## 2023-06-10 LAB — IRON,TIBC AND FERRITIN PANEL
Ferritin: 30 ng/mL (ref 15–150)
Iron Saturation: 13 % — ABNORMAL LOW (ref 15–55)
Iron: 56 ug/dL (ref 27–159)
Total Iron Binding Capacity: 417 ug/dL (ref 250–450)
UIBC: 361 ug/dL (ref 131–425)

## 2023-06-10 LAB — HEMOGLOBIN A1C
Est. average glucose Bld gHb Est-mCnc: 117 mg/dL
Hgb A1c MFr Bld: 5.7 % — ABNORMAL HIGH (ref 4.8–5.6)

## 2023-06-15 ENCOUNTER — Other Ambulatory Visit: Payer: Self-pay | Admitting: Nurse Practitioner

## 2023-06-16 ENCOUNTER — Other Ambulatory Visit (HOSPITAL_BASED_OUTPATIENT_CLINIC_OR_DEPARTMENT_OTHER): Payer: Self-pay | Admitting: Cardiovascular Disease

## 2023-06-19 ENCOUNTER — Encounter (HOSPITAL_BASED_OUTPATIENT_CLINIC_OR_DEPARTMENT_OTHER): Payer: Self-pay | Admitting: Cardiovascular Disease

## 2023-06-19 ENCOUNTER — Ambulatory Visit (INDEPENDENT_AMBULATORY_CARE_PROVIDER_SITE_OTHER): Payer: No Typology Code available for payment source | Admitting: Cardiovascular Disease

## 2023-06-19 VITALS — BP 132/82 | HR 92 | Ht 64.0 in | Wt >= 6400 oz

## 2023-06-19 DIAGNOSIS — E782 Mixed hyperlipidemia: Secondary | ICD-10-CM | POA: Diagnosis not present

## 2023-06-19 DIAGNOSIS — E1169 Type 2 diabetes mellitus with other specified complication: Secondary | ICD-10-CM | POA: Diagnosis not present

## 2023-06-19 DIAGNOSIS — E785 Hyperlipidemia, unspecified: Secondary | ICD-10-CM

## 2023-06-19 DIAGNOSIS — G4733 Obstructive sleep apnea (adult) (pediatric): Secondary | ICD-10-CM

## 2023-06-19 DIAGNOSIS — Z794 Long term (current) use of insulin: Secondary | ICD-10-CM

## 2023-06-19 DIAGNOSIS — I10 Essential (primary) hypertension: Secondary | ICD-10-CM | POA: Diagnosis not present

## 2023-06-19 DIAGNOSIS — E119 Type 2 diabetes mellitus without complications: Secondary | ICD-10-CM

## 2023-06-19 MED ORDER — TIRZEPATIDE 15 MG/0.5ML ~~LOC~~ SOAJ: 15.0000 mg | SUBCUTANEOUS | 1 refills | Status: DC

## 2023-06-19 NOTE — Patient Instructions (Signed)
Medication Instructions:  INCREASE YOUR MONJARO TO 15 MG WEEKLY   *If you need a refill on your cardiac medications before your next appointment, please call your pharmacy*  Lab Work: NONE If you have labs (blood work) drawn today and your tests are completely normal, you will receive your results only by: MyChart Message (if you have MyChart) OR A paper copy in the mail If you have any lab test that is abnormal or we need to change your treatment, we will call you to review the results.  Testing/Procedures: NONE  Follow-Up: At [facility], you and your health needs are our priority.  As part of our continuing mission to provide you with exceptional heart care, we have created designated Provider Care Teams.  These Care Teams include your primary Cardiologist (physician) and Advanced Practice Providers (APPs -  Physician Assistants and Nurse Practitioners) who all work together to provide you with the care you need, when you need it.  We recommend signing up for the patient portal called "MyChart".  Sign up information is provided on this After Visit Summary.  MyChart is used to connect with patients for Virtual Visits (Telemedicine).  Patients are able to view lab/test results, encounter notes, upcoming appointments, etc.  Non-urgent messages can be sent to your provider as well.   To learn more about what you can do with MyChart, go to [email or web address].    Your next appointment:   6 month(s)  Provider:   Chilton Si, MD or Gillian Shields, NP

## 2023-06-19 NOTE — Progress Notes (Signed)
Cardiology Office Note:  .   Date:  06/19/2023  ID:  Emily Phelps, DOB 10/06/1980, MRN [id number] PCP: [name], FNP   [facility] Providers Cardiologist:  Chilton Si, MD    History of Present Illness: .   Emily Phelps is a 42 y.o. female with a hx of hypertension, hyperlipidemia, diabetes mellitus, GERD, morbid obesity, and OSA on CPAP here for follow-up. She was initially seen 04/2021 to establish care in the [facility].  She saw Dr. Cherly Hensen on 11/2020 and reported hypertension worsening since she started having migraines. At that visit her blood pressure was 146/101. She was referred to [facility].  Today, she reports having hypertension since her mid 20's. At that time she was in [place], and working two jobs. For a while her blood pressure was well-controlled. However, in the past 2 years she has been struggling with health issues including recovering from COVID (09/2019) and her new diagnosis of diabetes.    At her initial visit her BP was 151/98. Amlodipine was added. Thiazide diuretics were avoided due to hyperparathyroidism and losartan was increased. She reported palpitations and wore a monitor that showed Mobitz 1 second degree AV block and rare PACs and PVCs. Echo 06/2021 revealed LVEF 55-60% with mild LVH and normal diastolic function. She was referred to sleep medicine and started on CPAP. She followed up with our pharmacist and blood pressures had been better controlled. She was started on Ozempic but struggled with weight loss.    At her visit 06/2022 BP was well-controlled and she was working on lifestyle changes.  She was started on rosuvastatin 09/2022.  She also switch from Ozempic to [place].  At her visit 11/2022 she noted increased lower extremity edem.  She was struggling with her weight and sedentary lifestyle.  She also struggled with the use of her CPAP.  She was stressed about her father being recently diagnosed with  pancreatic cancer.  Emily Phelps presents for a routine follow-up. She reports overall good health, with no new complaints. She recently underwent genetic testing due to a family history of aggressive pancreatic cancer, with the results showing no genetic predisposition.  She has been considering weight loss surgery and has been in consultation with a weight management team. She is considering a specific procedure, but is still undecided due to concerns about post-operative vitamin and protein supplementation. She has been adhering to a diet as recommended by the weight management team, but expresses feelings of overeating.  Emily Phelps also reports a recent incident of leg swelling and fatigue after a prolonged period of walking, which took some time to recover from. She acknowledges inconsistency with exercise and struggles with maintaining a healthy diet.  Her blood pressure has been well-controlled on her current regimen. She reports no chest pain or shortness of breath with daily activities, but does experience some breathlessness after climbing a flight of stairs or walking long distances.  She cannot walk 4 blocks without sympotms.   In terms of medication, the patient is considering an increase in her dose of Mounjaro, a weight loss medication, as part of her pre-surgical plan. She is also aware of the potential need for a stress test prior to surgery.      ROS:  As per HPI  Studies Reviewed: .         Risk Assessment/Calculations:     Physical Exam:   VS:  BP 132/82   Pulse 92   Ht 5\' 4"  (1.626 m)  Wt (!) 412 lb 1.6 oz (186.9 kg)   LMP 05/19/2023 (Exact Date) Comment: Spotting  SpO2 99%   BMI 70.74 kg/m  , BMI Body mass index is 70.74 kg/m. GENERAL:  Well appearing HEENT: Pupils equal round and reactive, fundi not visualized, oral mucosa unremarkable NECK:  No jugular venous distention, waveform within normal limits, carotid upstroke brisk and symmetric, no bruits, no  thyromegaly LUNGS:  Clear to auscultation bilaterally HEART:  RRR.  PMI not displaced or sustained,S1 and S2 within normal limits, no S3, no S4, no clicks, no rubs, no murmurs ABD:  Positive bowel sounds normal in frequency in pitch, no bruits, no rebound, no guarding, no midline pulsatile mass, no hepatomegaly, no splenomegaly EXT:  2 plus pulses throughout, no edema, no cyanosis no clubbing SKIN:  No rashes no nodules NEURO:  Cranial nerves II through XII grossly intact, motor grossly intact throughout PSYCH:  Cognitively intact, oriented to person place and time   ASSESSMENT AND PLAN: .    # Morbid Obesity: Considering bariatric surgery. Discussed the different surgical options and the need for consistent nutrition and exercise. -Increase Mounjaro to 15mg . -Continue with weight management consultations at [facility]. -Consider pre-surgical stress test vs coronary CT-A depending on the timeline for surgery.  She cannot achieve 4 METs of physical activity without symptoms which is likely due to obesity and deconditioning, but she may require additional testing before surgery.  # Hypertension Stable on current medications (atenolol, amlodipine, spironolactone, and valsartan). -Continue current medications. -Monitor blood pressure regularly.  # Family History of Pancreatic Cancer Recent genetic testing did not indicate a higher risk for pancreatic cancer. -No further action required at this time.  # Anemia Hematocrit and hemoglobin levels slightly low but stable. -Monitor levels regularly.  Follow-up in 6 months or sooner if any changes occur.      Time spent: 45 minutes-Time spent reviewing chart, discussing surgical options, and charting.     Signed, Chilton Si, MD

## 2023-06-22 ENCOUNTER — Other Ambulatory Visit: Payer: Self-pay | Admitting: Nurse Practitioner

## 2023-06-24 ENCOUNTER — Other Ambulatory Visit: Payer: Self-pay | Admitting: Nurse Practitioner

## 2023-06-24 ENCOUNTER — Other Ambulatory Visit: Payer: Self-pay | Admitting: Cardiovascular Disease

## 2023-06-29 ENCOUNTER — Ambulatory Visit
Admission: RE | Admit: 2023-06-29 | Discharge: 2023-06-29 | Disposition: A | Discharge status: Home / Self Care | Payer: No Typology Code available for payment source | Source: Ambulatory Visit | Attending: Nurse Practitioner | Admitting: Nurse Practitioner

## 2023-06-29 DIAGNOSIS — Z1231 Encounter for screening mammogram for malignant neoplasm of breast: Secondary | ICD-10-CM

## 2023-06-30 ENCOUNTER — Encounter: Payer: Self-pay | Admitting: Nurse Practitioner

## 2023-07-04 ENCOUNTER — Other Ambulatory Visit: Payer: Self-pay | Admitting: Nurse Practitioner

## 2023-07-04 DIAGNOSIS — E119 Type 2 diabetes mellitus without complications: Secondary | ICD-10-CM

## 2023-07-28 ENCOUNTER — Encounter: Payer: Self-pay | Admitting: Nurse Practitioner

## 2023-07-29 ENCOUNTER — Ambulatory Visit: Payer: No Typology Code available for payment source | Admitting: Nurse Practitioner

## 2023-07-29 ENCOUNTER — Encounter: Payer: Self-pay | Admitting: Nurse Practitioner

## 2023-07-29 VITALS — BP 120/48 | HR 68 | Temp 98.1°F | Ht 64.0 in | Wt >= 6400 oz

## 2023-07-29 DIAGNOSIS — Z6841 Body Mass Index (BMI) 40.0 and over, adult: Secondary | ICD-10-CM

## 2023-07-29 DIAGNOSIS — F329 Major depressive disorder, single episode, unspecified: Secondary | ICD-10-CM | POA: Diagnosis not present

## 2023-07-29 DIAGNOSIS — E66813 Obesity, class 3: Secondary | ICD-10-CM

## 2023-07-29 DIAGNOSIS — F4321 Adjustment disorder with depressed mood: Secondary | ICD-10-CM

## 2023-07-29 NOTE — Progress Notes (Signed)
[name], CMA,acting as a Neurosurgeon for [name], FNP.,have documented all relevant documentation on the behalf of [name], FNP,as directed by  [name], FNP while in the presence of [name], FNP.  Subjective:  Patient ID: Emily Phelps , female    DOB: 1981-07-03 , 43 y.o.   MRN: [id number]  Chief Complaint  Patient presents with   Depression    HPI  Patient presents today to for a leave of absence, patient reports she would like a mental health break since her father passed away 7 months ago. Patient reports her work performance has decreased. She is requesting a 6 week leave due to the prolonged grief from the loss of there father. She is currently going to weight management at [facility] and they have her bariatric surgery on hold for 6 months. She would like to start her leave today 07/29/2023. March 3rd would be return date.   She has been having difficulty with urinating and has been to urology and when she returns in 2 weeks she may need a catheter. She is on nitrofuratoin and tamsulosin      Past Medical History:  Diagnosis Date   Abscess of skin of abdomen 05/15/2022   She has a healing wound to her abdomen, treated with antibiotic. Advised to continue antibiotics until completely gone.   Allergy    Diabetes mellitus (HCC)    Family history of adverse reaction to anesthesia    mother had n/v after    Family history of breast cancer    Family history of hypertrophic cardiomyopathy 05/13/2021   Family history of kidney cancer    Family history of pancreatic cancer    Family history of prostate cancer    GERD (gastroesophageal reflux disease)    Hyperlipidemia    Hypertension    Legionella pneumonia (HCC) 01/03/2020   Migraine    Obesity    Palpitations 05/13/2021   Pneumonia    Rectal bleeding    Resistant hypertension 11/28/2014   Sleep apnea    Wears contact lenses      Family History  Problem Relation Age of Onset   Hypertension Mother     Colon polyps Mother    Diabetes Father    Hypertension Father    Prostate cancer Father 67   Pancreatic cancer Father 10   Thyroid disease Maternal Aunt    Heart disease Maternal Uncle    Kidney failure Maternal Uncle    Cancer Paternal Aunt        pat 1/2 aunt with cancer NOS   Pancreatic cancer Paternal Uncle    Hypertension Maternal Grandmother    Heart disease Maternal Grandmother    Hypertrophic cardiomyopathy Maternal Grandmother    Breast cancer Cousin        dx < 50   Breast cancer Cousin        dx < 50   Hypertension Brother    Colon cancer Neg Hx    Esophageal cancer Neg Hx    Liver cancer Neg Hx    Stomach cancer Neg Hx    Rectal cancer Neg Hx      Current Outpatient Medications:    albuterol (VENTOLIN HFA) 108 (90 Base) MCG/ACT inhaler, Inhale 2 puffs into the lungs every 6 (six) hours as needed for wheezing or shortness of breath., Disp: 6.7 g, Rfl: 0   amLODipine (NORVASC) 10 MG tablet, TAKE 1 TABLET BY MOUTH ONCE DAILY, Disp: 90 tablet, Rfl: 3   atenolol (TENORMIN)  100 MG tablet, TAKE 1 TABLET BY MOUTH EVERY DAY, Disp: 90 tablet, Rfl: 3   atorvastatin (LIPITOR) 10 MG tablet, TAKE 1 TABLET BY MOUTH EVERY DAY, Disp: 30 tablet, Rfl: 2   Continuous Blood Gluc Receiver (DEXCOM G6 RECEIVER) DEVI, Use to check blood sugars dx code e11.65, Disp: 3 each, Rfl: 3   Continuous Glucose Sensor (DEXCOM G6 SENSOR) MISC, USE TO CHECK BLOOD SUGAR AND CHANGE EVERY 10 DAYS, Disp: 3 each, Rfl: 5   Continuous Glucose Transmitter (DEXCOM G6 TRANSMITTER) MISC, USE TO CHECK BLOOD SUGAR. CHANGE EVERY 90 DAYS, Disp: 1 each, Rfl: 3   cyclobenzaprine (FLEXERIL) 10 MG tablet, Take 1 tablet (10 mg total) by mouth 3 (three) times daily as needed for muscle spasms., Disp: 30 tablet, Rfl: 0   dicyclomine (BENTYL) 10 MG capsule, TAKE 1 CAPSULE BY MOUTH 4 TIMES DAILY AS NEEDED FOR SPASMS, Disp: 30 capsule, Rfl: 2   FARXIGA 5 MG TABS tablet, TAKE 1 TABLET BY MOUTH EVERY DAY, Disp: 90 tablet, Rfl: 0    fexofenadine (ALLEGRA) 180 MG tablet, TAKE 1 TABLET BY MOUTH EVERY DAY, Disp: 90 tablet, Rfl: 0   Ketoprofen (FROTEK) 10 % CREA, Apply 1 application  topically 4 (four) times daily., Disp: , Rfl:    Lancets (UNILET COMFORTOUCH LANCET) MISC, check blood sugar 3 TIMES DAILY AS DIRECTED, Disp: 200 each, Rfl: 1   Magnesium Glycinate 100 MG CAPS, Take 1 capsule by mouth every evening., Disp: 30 capsule, Rfl: 3   Multiple Vitamin (MULTIVITAMIN WITH MINERALS) TABS tablet, Take 1 tablet by mouth daily., Disp: , Rfl:    mupirocin ointment (BACTROBAN) 2 %, Apply 1 Application topically 2 (two) times daily., Disp: 22 g, Rfl: 0   nitrofurantoin, macrocrystal-monohydrate, (MACROBID) 100 MG capsule, Take 100 mg by mouth 2 (two) times daily., Disp: , Rfl:    omeprazole (PRILOSEC) 40 MG capsule, Take 1 capsule (40 mg total) by mouth 2 (two) times daily., Disp: 180 capsule, Rfl: 3   ondansetron (ZOFRAN) 4 MG tablet, Take 1 tablet (4 mg total) by mouth every 4 (four) hours as needed for nausea or vomiting., Disp: 6 tablet, Rfl: 0   progesterone (PROMETRIUM) 100 MG capsule, Take 200 mg by mouth at bedtime., Disp: , Rfl:    spironolactone (ALDACTONE) 50 MG tablet, TAKE 1 TABLET BY MOUTH EVERY DAY, Disp: 90 tablet, Rfl: 3   SUMAtriptan (IMITREX) 50 MG tablet, TAKE 1 TABLET BY MOUTH EVERY 2 HOURS AS NEEDED FOR MIGRAINE. MAY REPEAT in 2 hours IF HEADACHE persists OR recurs, Disp: 10 tablet, Rfl: 1   tamsulosin (FLOMAX) 0.4 MG CAPS capsule, Take 0.4 mg by mouth daily., Disp: , Rfl:    tirzepatide (MOUNJARO) 15 MG/0.5ML Pen, Inject 15 mg into the skin once a week., Disp: 6 mL, Rfl: 1   topiramate (TOPAMAX) 50 MG tablet, TAKE 1 TABLET BY MOUTH 2 TIMES DAILY, Disp: 180 tablet, Rfl: 1   traMADol (ULTRAM) 50 MG tablet, Take 1-2 tablets (50-100 mg total) by mouth every 6 (six) hours as needed for moderate pain., Disp: 15 tablet, Rfl: 0   valsartan (DIOVAN) 320 MG tablet, TAKE 1 TABLET BY MOUTH EVERY DAY, Disp: 90 tablet, Rfl: 3    Vitamin D, Ergocalciferol, (DRISDOL) 1.25 MG (50000 UNIT) CAPS capsule, TAKE 1 CAPSULE BY MOUTH EVERY 7 DAYS, Disp: 12 capsule, Rfl: 1   Allergies  Allergen Reactions   Latex Rash   Silicone Rash    Other reaction(s): Unknown   Tape Rash  Other reaction(s): Unknown Other reaction(s): Unknown     Review of Systems  Constitutional: Negative.   Respiratory: Negative.    Cardiovascular: Negative.   Neurological: Negative.   Psychiatric/Behavioral: Negative.       Today's Vitals   07/29/23 0929  BP: (!) 120/48  Pulse: 68  Temp: 98.1 F (36.7 C)  TempSrc: Oral  Weight: (!) 415 lb 12.8 oz (188.6 kg)  Height: 5\' 4"  (1.626 m)  PainSc: 3   PainLoc: Abdomen   Body mass index is 71.37 kg/m.  Wt Readings from Last 3 Encounters:  07/29/23 (!) 415 lb 12.8 oz (188.6 kg)  06/19/23 (!) 412 lb 1.6 oz (186.9 kg)  06/09/23 (!) 410 lb 12.8 oz (186.3 kg)    The 10-year ASCVD risk score (Arnett DK, et al., 2019) is: 4.7%   Values used to calculate the score:     Age: 89 years     Sex: Female     Is Non-Hispanic African American: Yes     Diabetic: Yes     Tobacco smoker: No     Systolic Blood Pressure: 120 mmHg     Is BP treated: Yes     HDL Cholesterol: 37 mg/dL     Total Cholesterol: 147 mg/dL  Objective:  Physical Exam Vitals reviewed.  Constitutional:      General: She is not in acute distress.    Appearance: Normal appearance. She is obese.  Cardiovascular:     Rate and Rhythm: Normal rate and regular rhythm.     Pulses: Normal pulses.     Heart sounds: Normal heart sounds. No murmur heard. Pulmonary:     Effort: Pulmonary effort is normal. No respiratory distress.     Breath sounds: Normal breath sounds. No wheezing.  Musculoskeletal:        General: No tenderness. Normal range of motion.  Skin:    General: Skin is warm and dry.     Capillary Refill: Capillary refill takes less than 2 seconds.     Coloration: Skin is not jaundiced.  Neurological:     General: No  focal deficit present.     Mental Status: She is alert and oriented to person, place, and time.     Cranial Nerves: No cranial nerve deficit.     Motor: No weakness.  Psychiatric:        Mood and Affect: Mood normal.        Behavior: Behavior normal.        Thought Content: Thought content normal.        Judgment: Judgment normal.         Assessment And Plan:  Grief  Reactive depression Assessment & Plan: She is currently dealing with the loss of her father and is having a very challenging time with adjusting the rest.  I will take her out of work until March 3 she has left her FMLA forms to be completed.  She will also be getting counseling   Class 3 severe obesity due to excess calories with serious comorbidity and body mass index (BMI) greater than or equal to 70 in adult [facility]) Assessment & Plan: She is encouraged to strive for BMI less than 30 to decrease cardiac risk. Advised to aim for at least 150 minutes of exercise per week. She is also going to weight management and at this time her work up for surgery is on hold until June 2025     Return for f/u week of February 24th for  return to work.  Patient was given opportunity to ask questions. Patient verbalized understanding of the plan and was able to repeat key elements of the plan. All questions were answered to their satisfaction.    Jeanell Sparrow, FNP, have reviewed all documentation for this visit. The documentation on 07/29/23 for the exam, diagnosis, procedures, and orders are all accurate and complete.   IF YOU HAVE BEEN REFERRED TO A SPECIALIST, IT MAY TAKE 1-2 WEEKS TO SCHEDULE/PROCESS THE REFERRAL. IF YOU HAVE NOT HEARD FROM US/SPECIALIST IN TWO WEEKS, PLEASE GIVE [place] A CALL AT [phone].

## 2023-08-11 ENCOUNTER — Encounter: Payer: Self-pay | Admitting: Nurse Practitioner

## 2023-08-11 NOTE — Assessment & Plan Note (Signed)
She is encouraged to strive for BMI less than 30 to decrease cardiac risk. Advised to aim for at least 150 minutes of exercise per week. She is also going to weight management and at this time her work up for surgery is on hold until June 2025

## 2023-08-11 NOTE — Assessment & Plan Note (Signed)
She is currently dealing with the loss of her father and is having a very challenging time with adjusting the rest.  I will take her out of work until March 3 she has left her FMLA forms to be completed.  She will also be getting counseling

## 2023-09-07 ENCOUNTER — Ambulatory Visit: Payer: No Typology Code available for payment source | Admitting: Nurse Practitioner

## 2023-09-07 ENCOUNTER — Encounter: Payer: Self-pay | Admitting: Nurse Practitioner

## 2023-09-07 VITALS — BP 120/70 | HR 88 | Temp 98.4°F | Ht 64.0 in | Wt >= 6400 oz

## 2023-09-07 DIAGNOSIS — F329 Major depressive disorder, single episode, unspecified: Secondary | ICD-10-CM

## 2023-09-07 DIAGNOSIS — F419 Anxiety disorder, unspecified: Secondary | ICD-10-CM | POA: Diagnosis not present

## 2023-09-07 DIAGNOSIS — E66813 Obesity, class 3: Secondary | ICD-10-CM

## 2023-09-07 DIAGNOSIS — K0510 Chronic gingivitis, plaque induced: Secondary | ICD-10-CM | POA: Diagnosis not present

## 2023-09-07 DIAGNOSIS — F4321 Adjustment disorder with depressed mood: Secondary | ICD-10-CM

## 2023-09-07 DIAGNOSIS — Z6841 Body Mass Index (BMI) 40.0 and over, adult: Secondary | ICD-10-CM

## 2023-09-07 MED ORDER — BUPROPION HCL ER (XL) 150 MG PO TB24: 150.0000 mg | ORAL_TABLET | ORAL | 2 refills | Status: DC

## 2023-09-07 NOTE — Progress Notes (Unsigned)
 [name], CMA,acting as a Neurosurgeon for [name], FNP.,have documented all relevant documentation on the behalf of [name], FNP,as directed by  [name], FNP while in the presence of [name], FNP.  Subjective:  Patient ID: Emily Phelps , female    DOB: [birth year] , 43 y.o.   MRN: [id number]  No chief complaint on file.   HPI  Patient presents today for a grief follow up, Patient reports compliance with medication. Patient denies any chest pain, SOB, or headaches. Patient reports she did sign a release for [employer] to share records- Therapist Jennet Maduro [phone]. She recommends the patient remain out of work for another 2 weeks to continue developing a plan for her coping skills and recommend medication therapy for anxiety/depression.    She reports she was supposed to reach out on whether she thought she could go back to work; feels she needs medication maybe a psycotherapist. Consider continuing with accomodations if she has to return. Patient reports she sees her once a week. She reports the therapist told her she didn't think she was ready to go back to work- patient reports the therapist reccommended medication as well. She has been writing a letter to her dad and this weekend to the [place] for the first time.       Past Medical History:  Diagnosis Date   Abscess of skin of abdomen 05/15/2022   She has a healing wound to her abdomen, treated with antibiotic. Advised to continue antibiotics until completely gone.   Allergy    Diabetes mellitus (HCC)    Family history of adverse reaction to anesthesia    mother had n/v after    Family history of breast cancer    Family history of hypertrophic cardiomyopathy 05/13/2021   Family history of kidney cancer    Family history of pancreatic cancer    Family history of prostate cancer    GERD (gastroesophageal reflux disease)    Hyperlipidemia    Hypertension    Legionella pneumonia (HCC) 01/03/2020    Migraine    Obesity    Palpitations 05/13/2021   Pneumonia    Rectal bleeding    Resistant hypertension 11/28/2014   Sleep apnea    Wears contact lenses     Family History  Problem Relation Age of Onset   Hypertension Mother    Colon polyps Mother    Diabetes Father    Hypertension Father    Prostate cancer Father 82   Pancreatic cancer Father 27   Thyroid disease Maternal Aunt    Heart disease Maternal Uncle    Kidney failure Maternal Uncle    Cancer Paternal Aunt        pat 1/2 aunt with cancer NOS   Pancreatic cancer Paternal Uncle    Hypertension Maternal Grandmother    Heart disease Maternal Grandmother    Hypertrophic cardiomyopathy Maternal Grandmother    Breast cancer Cousin        dx < 50   Breast cancer Cousin        dx < 50   Hypertension Brother    Colon cancer Neg Hx    Esophageal cancer Neg Hx    Liver cancer Neg Hx    Stomach cancer Neg Hx    Rectal cancer Neg Hx      Current Outpatient Medications:    albuterol (VENTOLIN HFA) 108 (90 Base) MCG/ACT inhaler, Inhale 2 puffs into the lungs every 6 (six) hours as needed for wheezing or  shortness of breath., Disp: 6.7 g, Rfl: 0   amLODipine (NORVASC) 10 MG tablet, TAKE 1 TABLET BY MOUTH ONCE DAILY, Disp: 90 tablet, Rfl: 3   atenolol (TENORMIN) 100 MG tablet, TAKE 1 TABLET BY MOUTH EVERY DAY, Disp: 90 tablet, Rfl: 3   atorvastatin (LIPITOR) 10 MG tablet, TAKE 1 TABLET BY MOUTH EVERY DAY, Disp: 30 tablet, Rfl: 2   buPROPion (WELLBUTRIN XL) 150 MG 24 hr tablet, Take 1 tablet (150 mg total) by mouth every morning., Disp: 30 tablet, Rfl: 2   Continuous Blood Gluc Receiver (DEXCOM G6 RECEIVER) DEVI, Use to check blood sugars dx code e11.65, Disp: 3 each, Rfl: 3   Continuous Glucose Sensor (DEXCOM G6 SENSOR) MISC, USE TO CHECK BLOOD SUGAR AND CHANGE EVERY 10 DAYS, Disp: 3 each, Rfl: 5   Continuous Glucose Transmitter (DEXCOM G6 TRANSMITTER) MISC, USE TO CHECK BLOOD SUGAR. CHANGE EVERY 90 DAYS, Disp: 1 each, Rfl: 3    cyclobenzaprine (FLEXERIL) 10 MG tablet, Take 1 tablet (10 mg total) by mouth 3 (three) times daily as needed for muscle spasms., Disp: 30 tablet, Rfl: 0   dicyclomine (BENTYL) 10 MG capsule, TAKE 1 CAPSULE BY MOUTH 4 TIMES DAILY AS NEEDED FOR SPASMS, Disp: 30 capsule, Rfl: 2   FARXIGA 5 MG TABS tablet, TAKE 1 TABLET BY MOUTH EVERY DAY, Disp: 90 tablet, Rfl: 0   fexofenadine (ALLEGRA) 180 MG tablet, TAKE 1 TABLET BY MOUTH EVERY DAY, Disp: 90 tablet, Rfl: 0   Ketoprofen (FROTEK) 10 % CREA, Apply 1 application  topically 4 (four) times daily., Disp: , Rfl:    Lancets (UNILET COMFORTOUCH LANCET) MISC, check blood sugar 3 TIMES DAILY AS DIRECTED, Disp: 200 each, Rfl: 1   Magnesium Glycinate 100 MG CAPS, Take 1 capsule by mouth every evening., Disp: 30 capsule, Rfl: 3   Multiple Vitamin (MULTIVITAMIN WITH MINERALS) TABS tablet, Take 1 tablet by mouth daily., Disp: , Rfl:    mupirocin ointment (BACTROBAN) 2 %, Apply 1 Application topically 2 (two) times daily., Disp: 22 g, Rfl: 0   nitrofurantoin, macrocrystal-monohydrate, (MACROBID) 100 MG capsule, Take 100 mg by mouth 2 (two) times daily., Disp: , Rfl:    omeprazole (PRILOSEC) 40 MG capsule, Take 1 capsule (40 mg total) by mouth 2 (two) times daily., Disp: 180 capsule, Rfl: 3   ondansetron (ZOFRAN) 4 MG tablet, Take 1 tablet (4 mg total) by mouth every 4 (four) hours as needed for nausea or vomiting., Disp: 6 tablet, Rfl: 0   progesterone (PROMETRIUM) 100 MG capsule, Take 200 mg by mouth at bedtime., Disp: , Rfl:    spironolactone (ALDACTONE) 50 MG tablet, TAKE 1 TABLET BY MOUTH EVERY DAY, Disp: 90 tablet, Rfl: 3   SUMAtriptan (IMITREX) 50 MG tablet, TAKE 1 TABLET BY MOUTH EVERY 2 HOURS AS NEEDED FOR MIGRAINE. MAY REPEAT in 2 hours IF HEADACHE persists OR recurs, Disp: 10 tablet, Rfl: 1   tamsulosin (FLOMAX) 0.4 MG CAPS capsule, Take 0.4 mg by mouth daily., Disp: , Rfl:    tirzepatide (MOUNJARO) 15 MG/0.5ML Pen, Inject 15 mg into the skin once a week.,  Disp: 6 mL, Rfl: 1   topiramate (TOPAMAX) 50 MG tablet, TAKE 1 TABLET BY MOUTH 2 TIMES DAILY, Disp: 180 tablet, Rfl: 1   traMADol (ULTRAM) 50 MG tablet, Take 1-2 tablets (50-100 mg total) by mouth every 6 (six) hours as needed for moderate pain., Disp: 15 tablet, Rfl: 0   valsartan (DIOVAN) 320 MG tablet, TAKE 1 TABLET BY MOUTH EVERY DAY, Disp:  90 tablet, Rfl: 3   Vitamin D, Ergocalciferol, (DRISDOL) 1.25 MG (50000 UNIT) CAPS capsule, TAKE 1 CAPSULE BY MOUTH EVERY 7 DAYS, Disp: 12 capsule, Rfl: 1   Allergies  Allergen Reactions   Latex Rash   Silicone Rash    Other reaction(s): Unknown   Tape Rash    Other reaction(s): Unknown Other reaction(s): Unknown     Review of Systems  Constitutional: Negative.   Respiratory: Negative.    Cardiovascular: Negative.   Neurological: Negative.   Psychiatric/Behavioral: Negative.       Today's Vitals   09/07/23 1142  BP: 120/70  Pulse: 88  Temp: 98.4 F (36.9 C)  TempSrc: Oral  Weight: (!) 421 lb 9.6 oz (191.2 kg)  Height: 5\' 4"  (1.626 m)  PainSc: 0-No pain   Body mass index is 72.37 kg/m.  Wt Readings from Last 3 Encounters:  09/07/23 (!) 421 lb 9.6 oz (191.2 kg)  07/29/23 (!) 415 lb 12.8 oz (188.6 kg)  06/19/23 (!) 412 lb 1.6 oz (186.9 kg)    The 10-year ASCVD risk score (Arnett DK, et al., 2019) is: 4.7%   Values used to calculate the score:     Age: 89 years     Sex: Female     Is Non-Hispanic African American: Yes     Diabetic: Yes     Tobacco smoker: No     Systolic Blood Pressure: 120 mmHg     Is BP treated: Yes     HDL Cholesterol: 37 mg/dL     Total Cholesterol: 147 mg/dL  Objective:  Physical Exam Vitals reviewed.  Constitutional:      General: She is not in acute distress.    Appearance: Normal appearance. She is obese.  Cardiovascular:     Rate and Rhythm: Normal rate and regular rhythm.     Pulses: Normal pulses.     Heart sounds: Normal heart sounds. No murmur heard. Pulmonary:     Effort: Pulmonary  effort is normal. No respiratory distress.     Breath sounds: Normal breath sounds. No wheezing.  Musculoskeletal:        General: No tenderness. Normal range of motion.  Skin:    General: Skin is warm and dry.     Capillary Refill: Capillary refill takes less than 2 seconds.     Coloration: Skin is not jaundiced.  Neurological:     General: No focal deficit present.     Mental Status: She is alert and oriented to person, place, and time.     Cranial Nerves: No cranial nerve deficit.     Motor: No weakness.  Psychiatric:        Mood and Affect: Mood normal.        Behavior: Behavior normal.        Thought Content: Thought content normal.        Judgment: Judgment normal.         Assessment And Plan:  Grief -     buPROPion HCl ER (XL); Take 1 tablet (150 mg total) by mouth every morning.  Dispense: 30 tablet; Refill: 2  Reactive depression -     buPROPion HCl ER (XL); Take 1 tablet (150 mg total) by mouth every morning.  Dispense: 30 tablet; Refill: 2  Gum inflammation -     Vitamin B12  Anxiety    No follow-ups on file.  Patient was given opportunity to ask questions. Patient verbalized understanding of the plan and was able to repeat key  elements of the plan. All questions were answered to their satisfaction.    Jeanell Sparrow, FNP, have reviewed all documentation for this visit. The documentation on 09/07/23 for the exam, diagnosis, procedures, and orders are all accurate and complete.   IF YOU HAVE BEEN REFERRED TO A SPECIALIST, IT MAY TAKE 1-2 WEEKS TO SCHEDULE/PROCESS THE REFERRAL. IF YOU HAVE NOT HEARD FROM US/SPECIALIST IN TWO WEEKS, PLEASE GIVE [place] A CALL AT [phone].

## 2023-09-07 NOTE — Patient Instructions (Addendum)
 Return to work on March 17th will need to f/u with your HR to see if we need a new form or can addend the previous form

## 2023-09-08 ENCOUNTER — Encounter: Payer: Self-pay | Admitting: Physician Assistant

## 2023-09-08 ENCOUNTER — Other Ambulatory Visit: Payer: Self-pay | Admitting: Physician Assistant

## 2023-09-08 ENCOUNTER — Other Ambulatory Visit (INDEPENDENT_AMBULATORY_CARE_PROVIDER_SITE_OTHER): Payer: No Typology Code available for payment source

## 2023-09-08 ENCOUNTER — Telehealth: Payer: Self-pay

## 2023-09-08 ENCOUNTER — Other Ambulatory Visit: Payer: Self-pay | Admitting: Nurse Practitioner

## 2023-09-08 ENCOUNTER — Ambulatory Visit: Payer: No Typology Code available for payment source | Admitting: Physician Assistant

## 2023-09-08 VITALS — BP 128/86 | HR 100 | Ht 64.0 in | Wt >= 6400 oz

## 2023-09-08 DIAGNOSIS — K5900 Constipation, unspecified: Secondary | ICD-10-CM | POA: Diagnosis not present

## 2023-09-08 DIAGNOSIS — K760 Fatty (change of) liver, not elsewhere classified: Secondary | ICD-10-CM | POA: Diagnosis not present

## 2023-09-08 DIAGNOSIS — D509 Iron deficiency anemia, unspecified: Secondary | ICD-10-CM

## 2023-09-08 DIAGNOSIS — R1084 Generalized abdominal pain: Secondary | ICD-10-CM

## 2023-09-08 DIAGNOSIS — R160 Hepatomegaly, not elsewhere classified: Secondary | ICD-10-CM

## 2023-09-08 DIAGNOSIS — K5909 Other constipation: Secondary | ICD-10-CM

## 2023-09-08 DIAGNOSIS — K219 Gastro-esophageal reflux disease without esophagitis: Secondary | ICD-10-CM

## 2023-09-08 DIAGNOSIS — E119 Type 2 diabetes mellitus without complications: Secondary | ICD-10-CM

## 2023-09-08 LAB — CBC WITH DIFFERENTIAL/PLATELET
Basophils Absolute: 0.1 10*3/uL (ref 0.0–0.1)
Basophils Relative: 0.9 % (ref 0.0–3.0)
Eosinophils Absolute: 0.2 10*3/uL (ref 0.0–0.7)
Eosinophils Relative: 1.8 % (ref 0.0–5.0)
HCT: 40.8 % (ref 36.0–46.0)
Hemoglobin: 13.4 g/dL (ref 12.0–15.0)
Lymphocytes Relative: 25.3 % (ref 12.0–46.0)
Lymphs Abs: 2.6 10*3/uL (ref 0.7–4.0)
MCHC: 32.9 g/dL (ref 30.0–36.0)
MCV: 76.9 fL — ABNORMAL LOW (ref 78.0–100.0)
Monocytes Absolute: 0.6 10*3/uL (ref 0.1–1.0)
Monocytes Relative: 6.3 % (ref 3.0–12.0)
Neutro Abs: 6.7 10*3/uL (ref 1.4–7.7)
Neutrophils Relative %: 65.7 % (ref 43.0–77.0)
Platelets: 443 10*3/uL — ABNORMAL HIGH (ref 150.0–400.0)
RBC: 5.3 Mil/uL — ABNORMAL HIGH (ref 3.87–5.11)
RDW: 18.9 % — ABNORMAL HIGH (ref 11.5–15.5)
WBC: 10.2 10*3/uL (ref 4.0–10.5)

## 2023-09-08 LAB — GAMMA GT: GGT: 98 U/L — ABNORMAL HIGH (ref 7–51)

## 2023-09-08 LAB — BASIC METABOLIC PANEL
BUN: 13 mg/dL (ref 6–23)
CO2: 24 meq/L (ref 19–32)
Calcium: 9.8 mg/dL (ref 8.4–10.5)
Chloride: 104 meq/L (ref 96–112)
Creatinine, Ser: 0.82 mg/dL (ref 0.40–1.20)
GFR: 89.61 mL/min (ref >60.00)
Glucose, Bld: 108 mg/dL — ABNORMAL HIGH (ref 70–99)
Potassium: 4 meq/L (ref 3.5–5.1)
Sodium: 139 meq/L (ref 135–145)

## 2023-09-08 LAB — IBC + FERRITIN
Ferritin: 36.1 ng/mL (ref 10.0–291.0)
Iron: 79 ug/dL (ref 42–145)
Saturation Ratios: 18.3 % — ABNORMAL LOW (ref 20.0–50.0)
TIBC: 431.2 ug/dL (ref 250.0–450.0)
Transferrin: 308 mg/dL (ref 212.0–360.0)

## 2023-09-08 LAB — HEPATIC FUNCTION PANEL
ALT: 19 U/L (ref 0–35)
AST: 14 U/L (ref 0–37)
Albumin: 4.6 g/dL (ref 3.5–5.2)
Alkaline Phosphatase: 93 U/L (ref 39–117)
Bilirubin, Direct: 0 mg/dL (ref 0.0–0.3)
Total Bilirubin: 0.5 mg/dL (ref 0.2–1.2)
Total Protein: 8.3 g/dL (ref 6.0–8.3)

## 2023-09-08 LAB — PROTIME-INR
INR: 1.1 {ratio} — ABNORMAL HIGH (ref 0.8–1.0)
Prothrombin Time: 11.9 s (ref 9.6–13.1)

## 2023-09-08 NOTE — Progress Notes (Signed)
 Agree with assessment and plan as outlined. Recent endoscopic evaluation noted, agree IDA more likely related to menstrual blood loss in the absence of any overt blood loss.

## 2023-09-08 NOTE — Telephone Encounter (Signed)
 error

## 2023-09-08 NOTE — Patient Instructions (Addendum)
 Your provider has requested that you go to the basement level for lab work before leaving today. Press "B" on the elevator. The lab is located at the first door on the left as you exit the elevator.  Miralax is an osmotic laxative.  It only brings more water into the stool.  This is safe to take daily.  Can take up to 17 gram of miralax twice a day.  Mix with juice or coffee.  Start 1 capful at night for 3-4 days and reassess your response in 3-4 days.  You can increase and decrease the dose based on your response.  Remember, it can take up to 3-4 days to take effect OR for the effects to wear off.   I often pair this with benefiber in the morning to help assure the stool is not too loose.   You will be contacted by [facility] Sched[facility] in the next 2 days to arrange a  US Abdominal Ultrasound with Elastography.  The number on your caller ID will be [phone], please answer when they call.  If you have not heard from them in 2 days please call [phone] to schedule.      Metabolic dysfunction associated seatohepatitis  Now the leading cause of liver failure in the [place].  It is normally from such risk factors as obesity, diabetes, insulin resistance, high cholesterol, or metabolic syndrome.  The only definitive therapy is weight loss and exercise.   Suggest walking 20-30 mins daily.  Decreasing carbohydrates, increasing veggies.    Fatty Liver Fatty liver is the accumulation of fat in liver cells. It is also called hepatosteatosis or steatohepatitis. It is normal for your liver to contain some fat. If fat is more than 5 to 10% of your liver's weight, you have fatty liver.  There are often no symptoms (problems) for years while damage is still occurring. People often learn about their fatty liver when they have medical tests for other reasons. Fat can damage your liver for years or even decades without causing problems. When it becomes severe, it can cause fatigue,  weight loss, weakness, and confusion. This makes you more likely to develop more serious liver problems. The liver is the largest organ in the body. It does a lot of work and often gives no warning signs when it is sick until late in a disease. The liver has many important jobs including: Breaking down foods. Storing vitamins, iron, and other minerals. Making proteins. Making bile for food digestion. Breaking down many products including medications, alcohol and some poisons.  PROGNOSIS  Fatty liver may cause no damage or it can lead to an inflammation of the liver. This is, called steatohepatitis.  Over time the liver may become scarred and hardened. This condition is called cirrhosis. Cirrhosis is serious and may lead to liver failure or cancer. NASH is one of the leading causes of cirrhosis. About 10-20% of Americans have fatty liver and a smaller 2-5% has NASH.  TREATMENT  Weight loss, fat restriction, and exercise in overweight patients produces inconsistent results but is worth trying. Good control of diabetes may reduce fatty liver. Eat a balanced, healthy diet. Increase your physical activity. There are no medical or surgical treatments for a fatty liver or NASH, but improving your diet and increasing your exercise may help prevent or reverse some of the damage.  Here some information about pelvic floor dysfunction. This may be contributing to some of your symptoms. We will continue with our evaluation  but I do want you to consider adding on fiber supplement with low-dose MiraLAX daily. We could also refer to pelvic floor physical therapy.  Toileting tips to help with your constipation - Drink at least 64-80 ounces of water/liquid per day. - Establish a time to try to move your bowels every day.  For many people, this is after a cup of coffee or after a meal such as breakfast. - Sit all of the way back on the toilet keeping your back fairly straight and while sitting up, try to  rest the tops of your forearms on your upper thighs.   - Raising your feet with a step stool/squatty potty can be helpful to improve the angle that allows your stool to pass through the rectum. - Relax the rectum feeling it bulge toward the toilet water.  If you feel your rectum raising toward your body, you are contracting rather than relaxing. - Breathe in and slowly exhale. "Belly breath" by expanding your belly towards your belly button. Keep belly expanded as you gently direct pressure down and back to the anus.  A low pitched GRRR sound can assist with increasing intra-abdominal pressure.  (Can also trying to blow on a pinwheel and make it move, this helps with the same belly breathing) - Repeat 3-4 times. If unsuccessful, contract the pelvic floor to restore normal tone and get off the toilet.  Avoid excessive straining. - To reduce excessive wiping by teaching your anus to normally contract, place hands on outer aspect of knees and resist knee movement outward.  Hold 5-10 second then place hands just inside of knees and resist inward movement of knees.  Hold 5 seconds.  Repeat a few times each way.  Go to the ER if unable to pass gas, severe AB pain, unable to hold down food, any shortness of breath of chest pain.    Pelvic Floor Dysfunction, Female Pelvic floor dysfunction (PFD) is a condition that results when the group of muscles and connective tissues that support the organs in the pelvis (pelvic floor muscles) do not work well. These muscles and their connections form a sling that supports the colon and bladder. In women, they also support the uterus. PFD causes pelvic floor muscles to be too weak, too tight, or both. In PFD, muscle movements are not coordinated. This may cause bowel or bladder problems. It may also cause pain. What are the causes? This condition may be caused by an injury to the pelvic area or by a weakening of pelvic muscles. This often results from pregnancy and  childbirth or other types of strain. In many cases, the exact cause is not known. What increases the risk? The following factors may make you more likely to develop this condition: Having chronic bladder tissue inflammation (interstitial cystitis). Being an older person. Being overweight. History of radiation treatment for cancer in the pelvic region. Previous pelvic surgery, such as removal of the uterus (hysterectomy). What are the signs or symptoms? Symptoms of this condition vary and may include: Bladder symptoms, such as: Trouble starting urination and emptying the bladder. Frequent urinary tract infections. Leaking urine when coughing, laughing, or exercising (stress incontinence). Having to pass urine urgently or frequently. Pain when passing urine. Bowel symptoms, such as: Constipation. Urgent or frequent bowel movements. Incomplete bowel movements. Painful bowel movements. Leaking stool or gas. Unexplained genital or rectal pain. Genital or rectal muscle spasms. Low back pain. Other symptoms may include: A heavy, full, or aching feeling in the vagina.  A bulge that protrudes into the vagina. Pain during or after sex. How is this diagnosed? This condition may be diagnosed based on: Your symptoms and medical history. A physical exam. During the exam, your health care provider may check your pelvic muscles for tightness, spasm, pain, or weakness. This may include a rectal exam and a pelvic exam. In some cases, you may have diagnostic tests, such as: Electrical muscle function tests. Urine flow testing. X-ray tests of bowel function. Ultrasound of the pelvic organs. How is this treated? Treatment for this condition depends on the symptoms. Treatment options include: Physical therapy. This may include Kegel exercises to help relax or strengthen the pelvic floor muscles. Biofeedback. This type of therapy provides feedback on how tight your pelvic floor muscles are so that  you can learn to control them. Internal or external massage therapy. A treatment that involves electrical stimulation of the pelvic floor muscles to help control pain (transcutaneous electrical nerve stimulation, or TENS). Sound wave therapy (ultrasound) to reduce muscle spasms. Medicines, such as: Muscle relaxants. Bladder control medicines. Surgery to reconstruct or support pelvic floor muscles may be an option if other treatments do not help. Follow these instructions at home: Activity Do your usual activities as told by your health care provider. Ask your health care provider if you should modify any activities. Do pelvic floor strengthening or relaxing exercises at home as told by your physical therapist. Lifestyle Maintain a healthy weight. Eat foods that are high in fiber, such as beans, whole grains, and fresh fruits and vegetables. Limit foods that are high in fat and processed sugars, such as fried or sweet foods. Manage stress with relaxation techniques such as yoga or meditation. General instructions If you have problems with leakage: Use absorbable pads or wear padded underwear. Wash frequently with mild soap. Keep your genital and anal area as clean and dry as possible. Ask your health care provider if you should try a barrier cream to prevent skin irritation. Take warm baths to relieve pelvic muscle tension or spasms. Take over-the-counter and prescription medicines only as told by your health care provider. Keep all follow-up visits. How is this prevented? The cause of PFD is not always known, but there are a few things you can do to reduce the risk of developing this condition, including: Staying at a healthy weight. Getting regular exercise. Managing stress. Contact a health care provider if: Your symptoms are not improving with home care. You have signs or symptoms of PFD that get worse at home. You develop new signs or symptoms. You have signs of a urinary tract  infection, such as: Fever. Chills. Increased urinary frequency. A burning feeling when urinating. You have not had a bowel movement in 3 days (constipation). Summary Pelvic floor dysfunction results when the muscles and connective tissues in your pelvic floor do not work well. These muscles and their connections form a sling that supports your colon and bladder. In women, they also support the uterus. PFD may be caused by an injury to the pelvic area or by a weakening of pelvic muscles. PFD causes pelvic floor muscles to be too weak, too tight, or a combination of both. Symptoms may vary from person to person. In most cases, PFD can be treated with physical therapies and medicines. Surgery may be an option if other treatments do not help. This information is not intended to replace advice given to you by your health care provider. Make sure you discuss any questions you have with  your health care provider. Document Revised: 11/07/2020 Document Reviewed: 11/07/2020 Elsevier Patient Education  2022 [employer]   Gastroparesis due to [facility] Please do small frequent meals like 4-6 meals a day.  Eat and drink liquids at separate times.  Avoid high fiber foods, cook your vegetables, avoid high fat food.  Suggest spreading protein throughout the day (greek yogurt, glucerna, soft meat, milk, eggs) Choose soft foods that you can mash with a fork When you are more symptomatic, change to pureed foods foods and liquids.  Consider reading "Living well with Gastroparesis" by Reuel Derby Gastroparesis is a condition in which food takes longer than normal to empty from the stomach. This condition is also known as delayed gastric emptying. It is usually a long-term (chronic) condition. There is no cure, but there are treatments and things that you can do at home to help relieve symptoms. Treating the underlying condition that causes gastroparesis can also help relieve symptoms What are the  causes? In many cases, the cause of this condition is not known. Possible causes include: A hormone (endocrine) disorder, such as hypothyroidism or diabetes. A nervous system disease, such as Parkinson's disease or multiple sclerosis. Cancer, infection, or surgery that affects the stomach or vagus nerve. The vagus nerve runs from your chest, through your neck, and to the lower part of your brain. A connective tissue disorder, such as scleroderma. Certain medicines. What increases the risk? You are more likely to develop this condition if: You have certain disorders or diseases. These may include: An endocrine disorder. An eating disorder. Amyloidosis. Scleroderma. Parkinson's disease. Multiple sclerosis. Cancer or infection of the stomach or the vagus nerve. You have had surgery on your stomach or vagus nerve. You take certain medicines. You are female. What are the signs or symptoms? Symptoms of this condition include: Feeling full after eating very little or a loss of appetite. Nausea, vomiting, or heartburn. Bloating of your abdomen. Inconsistent blood sugar (glucose) levels on blood tests. Unexplained weight loss. Acid from the stomach coming up into the esophagus (gastroesophageal reflux). Sudden tightening (spasm) of the stomach, which can be painful. Symptoms may come and go. Some people may not notice any symptoms. How is this diagnosed? This condition is diagnosed with tests, such as: Tests that check how long it takes food to move through the stomach and intestines. These tests include: Upper gastrointestinal (GI) series. For this test, you drink a liquid that shows up well on X-rays, and then X-rays are taken of your intestines. Gastric emptying scintigraphy. For this test, you eat food that contains a small amount of radioactive material, and then scans are taken. Wireless capsule GI monitoring system. For this test, you swallow a pill (capsule) that records information  about how foods and fluid move through your stomach. Gastric manometry. For this test, a tube is passed down your throat and into your stomach to measure electrical and muscular activity. Endoscopy. For this test, a long, thin tube with a camera and light on the end is passed down your throat and into your stomach to check for problems in your stomach lining. Ultrasound. This test uses sound waves to create images of the inside of your body. This can help rule out gallbladder disease or pancreatitis as a cause of your symptoms. How is this treated? There is no cure for this condition, but treatment and home care may relieve symptoms. Treatment may include: Treating the underlying cause. Managing your symptoms by making changes to your diet and exercise  habits. Taking medicines to control nausea and vomiting and to stimulate stomach muscles. Getting food through a feeding tube in the hospital. This may be done in severe cases. Having surgery to insert a device called a gastric electrical stimulator into your body. This device helps improve stomach emptying and control nausea and vomiting. Follow these instructions at home: Take over-the-counter and prescription medicines only as told by your health care provider. Follow instructions from your health care provider about eating or drinking restrictions. Your health care provider may recommend that you: Eat smaller meals more often. Eat low-fat foods. Eat low-fiber forms of high-fiber foods. For example, eat cooked vegetables instead of raw vegetables. Have only liquid foods instead of solid foods. Liquid foods are easier to digest. Drink enough fluid to keep your urine pale yellow. Exercise as often as told by your health care provider. Keep all follow-up visits. This is important. Contact a health care provider if you: Notice that your symptoms do not improve with treatment. Have new symptoms. Get help right away if you: Have severe pain in  your abdomen that does not improve with treatment. Have nausea that is severe or does not go away. Vomit every time you drink fluids. Summary Gastroparesis is a long-term (chronic) condition in which food takes longer than normal to empty from the stomach. Symptoms include nausea, vomiting, heartburn, bloating of your abdomen, and loss of appetite. Eating smaller portions, low-fat foods, and low-fiber forms of high-fiber foods may help you manage your symptoms. Get help right away if you have severe pain in your abdomen. This information is not intended to replace advice given to you by your health care provider. Make sure you discuss any questions you have with your health care provider. Document Revised: 11/07/2019 Document Reviewed: 11/07/2019 Elsevier Patient Education  2021 [employer].  Due to recent changes in healthcare laws, you may see the results of your imaging and laboratory studies on MyChart before your provider has had a chance to review them.  We understand that in some cases there may be results that are confusing or concerning to you. Not all laboratory results come back in the same time frame and the provider may be waiting for multiple results in order to interpret others.  Please give [place] 48 hours in order for your provider to thoroughly review all the results before contacting the office for clarification of your results.

## 2023-09-08 NOTE — Progress Notes (Signed)
 09/08/2023 [name] [id number] 22-Sep-1980  Referring provider: [name], FNP Primary GI doctor: Dr. Adela Lank (Dr. Orvan Falconer)  ASSESSMENT AND PLAN:   Constipation Worsening constipation with Mounjaro. No blood in stool. Dark stool likely secondary to iron supplementation. -Consider dietary changes such as increased intake of kiwis and prunes. -Provide information on Miralax as a potential treatment option. -prefers lifestyle for now, no alarm symptoms, monitor closely  Iron Deficiency Anemia On iron supplementation for approximately one month due to low iron and ferritin levels in November. Heavy menstrual bleeding has improved and is now more like spotting. -Continue iron supplementation. -Check iron levels and ferritin.  Fatty Liver Disease/hepatomegaly History of fatty liver disease with liver enlargement noted in 2023. No current alcohol use, but had a positive Peth test in December with bariatric surgeon -Will repeat ultrasound elastography -Will get him a cellular workup with hepatomegaly most likely this is secondary to [facility] but will rule out autoimmune -Order liver function tests and imaging to assess current liver status.  Gastroesophageal Reflux Disease (GERD) Well-controlled with occasional flare-ups when medication is forgotten.  No dysphagia, nausea vomiting. -Continue current medication regimen and ensure consistent use. -Likely gastric paresis component, given diet information  General Health Maintenance -Consider pelvic floor physical therapy for urinary frequency and back pain. -Continue monitoring blood glucose levels with Dexcom. -Follow up in 2-3 months.       History of Present Illness:  43 y.o. female  with a past medical history of diabetes, hypertension, hyperlipidemia, obesity GERD, gastritis, constipation/IBS, primary parathyroidism status post right inferior parathyroidectomy 11/06/2021 with Dr. Gerrit Friends and others listed below, returns to  clinic today for evaluation of GERD.  07/11/2019 EGD and colonoscopy EGD normal, colonoscopy unremarkable, no source for rectal bleeding identified, esophageal biopsies showed reflux negative EOE negative H. pylori or metaplasia. 07/18/2020 CT abdomen pelvis with contrast showed nephrolithiasis, stable hepatomegaly, stable umbilical and inguinal hernias no acute abnormalities.  Discussed the use of AI scribe software for clinical note transcription with the patient, who gave verbal consent to proceed.  History of Present Illness   Emily Phelps is a 43 year old female with diabetes and fatty liver who presents with gastrointestinal issues and medication management.  She experiences gastrointestinal issues, including constipation and occasional diarrhea, which she attributes to her current medication, Mounjaro. She switched to [facility] from [facility] due to availability issues. Constipation is managed with dietary adjustments, such as increased water intake and juicing. She notes darker stools, likely due to iron supplements started in November for anemia. No blood in stool.  She has a history of fatty liver diagnosed in 2023, with liver enlargement noted at that time. She has stopped alcohol consumption. Liver function tests were normal in November 2024, but no recent imaging has been done since then.  She has a history of anemia, with low iron levels noted in November, and has been on iron supplements since then. Her menstrual periods have improved from being heavy to more like spotting over the past three weeks, which she attributes to progesterone use since March or April. She has not had her iron levels rechecked recently.  She experiences occasional reflux and heartburn, which is generally well-controlled with medication, although she sometimes forgets to take it, leading to flare-ups.  She urinates frequently, which she attributes to her diabetes and high water intake. She drinks half a gallon  to a gallon of water daily. A recent urine culture was normal, and she is not on any diuretics.  She has a history of glossitis, which has improved with iron supplementation. She also reports gum inflammation, for which her dentist recommended checking B12 levels.      She  reports that she has never smoked. She has never used smokeless tobacco. She reports current alcohol use of about 4.0 standard drinks of alcohol per week. She reports that she does not currently use drugs after having used the following drugs: Marijuana. Her family history includes Breast cancer in her cousin and cousin; Cancer in her paternal aunt; Colon polyps in her mother; Diabetes in her father; Heart disease in her maternal grandmother and maternal uncle; Hypertension in her brother, father, maternal grandmother, and mother; Hypertrophic cardiomyopathy in her maternal grandmother; Kidney failure in her maternal uncle; Pancreatic cancer in her paternal uncle; Pancreatic cancer (age of onset: 18) in her father; Prostate cancer (age of onset: 68) in her father; Thyroid disease in her maternal aunt.   Current Medications:   Current Outpatient Medications (Endocrine & Metabolic):    FARXIGA 5 MG TABS tablet, TAKE 1 TABLET BY MOUTH EVERY DAY   progesterone (PROMETRIUM) 100 MG capsule, Take 200 mg by mouth at bedtime.   tirzepatide [facility]) 15 MG/0.5ML Pen, Inject 15 mg into the skin once a week.  Current Outpatient Medications (Cardiovascular):    amLODipine (NORVASC) 10 MG tablet, TAKE 1 TABLET BY MOUTH ONCE DAILY   atenolol (TENORMIN) 100 MG tablet, TAKE 1 TABLET BY MOUTH EVERY DAY   atorvastatin (LIPITOR) 10 MG tablet, TAKE 1 TABLET BY MOUTH EVERY DAY   spironolactone (ALDACTONE) 50 MG tablet, TAKE 1 TABLET BY MOUTH EVERY DAY   valsartan (DIOVAN) 320 MG tablet, TAKE 1 TABLET BY MOUTH EVERY DAY  Current Outpatient Medications (Respiratory):    albuterol (VENTOLIN HFA) 108 (90 Base) MCG/ACT inhaler, Inhale 2 puffs into  the lungs every 6 (six) hours as needed for wheezing or shortness of breath.   fexofenadine (ALLEGRA) 180 MG tablet, TAKE 1 TABLET BY MOUTH EVERY DAY  Current Outpatient Medications (Analgesics):    SUMAtriptan (IMITREX) 50 MG tablet, TAKE 1 TABLET BY MOUTH EVERY 2 HOURS AS NEEDED FOR MIGRAINE. MAY REPEAT in 2 hours IF HEADACHE persists OR recurs   traMADol (ULTRAM) 50 MG tablet, Take 1-2 tablets (50-100 mg total) by mouth every 6 (six) hours as needed for moderate pain.  Current Outpatient Medications (Hematological):    ferrous sulfate 325 (65 FE) MG tablet, Take 325 mg by mouth daily with breakfast.  Current Outpatient Medications (Other):    Continuous Blood Gluc Receiver (DEXCOM G6 RECEIVER) DEVI, Use to check blood sugars dx code e11.65   Continuous Glucose Sensor (DEXCOM G6 SENSOR) MISC, USE TO CHECK BLOOD SUGAR AND CHANGE EVERY 10 DAYS   Continuous Glucose Transmitter (DEXCOM G6 TRANSMITTER) MISC, USE TO CHECK BLOOD SUGAR. CHANGE EVERY 90 DAYS   cyclobenzaprine (FLEXERIL) 10 MG tablet, Take 1 tablet (10 mg total) by mouth 3 (three) times daily as needed for muscle spasms.   dicyclomine (BENTYL) 10 MG capsule, TAKE 1 CAPSULE BY MOUTH 4 TIMES DAILY AS NEEDED FOR SPASMS   Ketoprofen (FROTEK) 10 % CREA, Apply 1 application  topically 4 (four) times daily.   Lancets (UNILET COMFORTOUCH LANCET) MISC, check blood sugar 3 TIMES DAILY AS DIRECTED   Magnesium Glycinate 100 MG CAPS, Take 1 capsule by mouth every evening.   Multiple Vitamin (MULTIVITAMIN WITH MINERALS) TABS tablet, Take 1 tablet by mouth daily.   mupirocin ointment (BACTROBAN) 2 %, Apply 1 Application topically 2 (two)  times daily.   omeprazole (PRILOSEC) 40 MG capsule, Take 1 capsule (40 mg total) by mouth 2 (two) times daily.   ondansetron (ZOFRAN) 4 MG tablet, Take 1 tablet (4 mg total) by mouth every 4 (four) hours as needed for nausea or vomiting.   topiramate (TOPAMAX) 50 MG tablet, TAKE 1 TABLET BY MOUTH 2 TIMES DAILY    Vitamin D, Ergocalciferol, (DRISDOL) 1.25 MG (50000 UNIT) CAPS capsule, TAKE 1 CAPSULE BY MOUTH EVERY 7 DAYS   buPROPion (WELLBUTRIN XL) 150 MG 24 hr tablet, Take 1 tablet (150 mg total) by mouth every morning. (Patient not taking: Reported on 09/08/2023)   tamsulosin (FLOMAX) 0.4 MG CAPS capsule, Take 0.4 mg by mouth daily. (Patient not taking: Reported on 09/08/2023)  Surgical History:  She  has a past surgical history that includes Fracture surgery; right hand pin (07/15/2003); Colonoscopy with propofol (N/A, 07/11/2019); Esophagogastroduodenoscopy (egd) with propofol (N/A, 07/11/2019); biopsy (07/11/2019); Colonoscopy; Cervix biopsy; and Parathyroidectomy (Right, 11/06/2021).  Current Medications, Allergies, Past Medical History, Past Surgical History, Family History and Social History were reviewed in [employer] record.  Physical Exam: BP 128/86 (BP Location: Left Arm, Patient Position: Sitting, Cuff Size: Large)   Pulse 100   Ht 5\' 4"  (1.626 m)   Wt (!) 415 lb (188.2 kg)   LMP 08/21/2023 (Approximate)   BMI 71.23 kg/m  General:   Pleasant, obese female in no acute distress Heart : Regular rate and rhythm; no murmurs Pulm: Clear anteriorly; no wheezing Abdomen:  Soft, Obese AB, Active bowel sounds. Non tender Rectal: Not evaluated Extremities:  without  edema. Neurologic:  Alert and  oriented x4;  No focal deficits.  Psych:  Cooperative. Normal mood and affect.    Doree Albee, PA-C 09/08/23

## 2023-09-09 ENCOUNTER — Encounter: Payer: Self-pay | Admitting: Nurse Practitioner

## 2023-09-09 LAB — CERULOPLASMIN: Ceruloplasmin: 32 mg/dL (ref 14–48)

## 2023-09-09 LAB — ALPHA-1-ANTITRYPSIN: A-1 Antitrypsin, Ser: 158 mg/dL (ref 83–199)

## 2023-09-09 LAB — VITAMIN B12: Vitamin B-12: 329 pg/mL (ref 232–1245)

## 2023-09-09 LAB — HEPATITIS A ANTIBODY, TOTAL: Hepatitis A AB,Total: NONREACTIVE

## 2023-09-09 LAB — IGA: Immunoglobulin A: 233 mg/dL (ref 47–310)

## 2023-09-09 LAB — IGG: IgG (Immunoglobin G), Serum: 1386 mg/dL (ref 600–1640)

## 2023-09-09 LAB — ANTI-SMOOTH MUSCLE ANTIBODY, IGG: Actin (Smooth Muscle) Antibody (IGG): <20 U (ref <20)

## 2023-09-09 LAB — MITOCHONDRIAL ANTIBODIES: Mitochondrial M2 Ab, IgG: <=20 U (ref <=20.0)

## 2023-09-09 LAB — TISSUE TRANSGLUTAMINASE, IGA: (tTG) Ab, IgA: <1 U/mL

## 2023-09-09 LAB — ANA: Anti Nuclear Antibody (ANA): NEGATIVE

## 2023-09-09 LAB — HEPATITIS B SURFACE ANTIBODY,QUALITATIVE: Hep B S Ab: REACTIVE — AB

## 2023-09-10 DIAGNOSIS — R160 Hepatomegaly, not elsewhere classified: Secondary | ICD-10-CM

## 2023-09-10 DIAGNOSIS — K760 Fatty (change of) liver, not elsewhere classified: Secondary | ICD-10-CM

## 2023-09-10 DIAGNOSIS — Z6841 Body Mass Index (BMI) 40.0 and over, adult: Secondary | ICD-10-CM

## 2023-09-11 ENCOUNTER — Ambulatory Visit: Payer: Self-pay

## 2023-09-11 VITALS — BP 122/84 | HR 94 | Temp 98.1°F | Ht 64.0 in | Wt >= 6400 oz

## 2023-09-11 DIAGNOSIS — K0510 Chronic gingivitis, plaque induced: Secondary | ICD-10-CM | POA: Insufficient documentation

## 2023-09-11 DIAGNOSIS — F419 Anxiety disorder, unspecified: Secondary | ICD-10-CM | POA: Insufficient documentation

## 2023-09-11 MED ORDER — CYANOCOBALAMIN 1000 MCG/ML IJ SOLN
1000.0000 ug | Freq: Once | INTRAMUSCULAR | Status: AC
  Administered 2023-09-11: 1000 ug via INTRAMUSCULAR

## 2023-09-11 NOTE — Assessment & Plan Note (Signed)
 Will check vitamin B12 based on recommendation by her dentist and the concern for gum inflammation.

## 2023-09-11 NOTE — Progress Notes (Signed)
Patient presents today for 1st b12 injection.

## 2023-09-11 NOTE — Assessment & Plan Note (Addendum)
 She is in counseling and the therapist feels she is not ready to return to work. Will extend her leave by 2 weeks. Will also add wellbutrin to help with her mood, return in 4-6 weeks for follow up

## 2023-09-11 NOTE — Assessment & Plan Note (Signed)
 Continue f/u with counseling

## 2023-09-11 NOTE — Assessment & Plan Note (Signed)
 She is encouraged to strive for BMI less than 30 to decrease cardiac risk. Advised to aim for at least 150 minutes of exercise per week. Her weight at this time is directly related to her grief. I have started wellbutrin for her mood this may also help with her appetite

## 2023-09-11 NOTE — Assessment & Plan Note (Signed)
Continue with counseling. 

## 2023-09-17 ENCOUNTER — Ambulatory Visit

## 2023-09-17 DIAGNOSIS — R160 Hepatomegaly, not elsewhere classified: Secondary | ICD-10-CM

## 2023-09-17 DIAGNOSIS — Z23 Encounter for immunization: Secondary | ICD-10-CM | POA: Diagnosis not present

## 2023-09-17 DIAGNOSIS — K760 Fatty (change of) liver, not elsewhere classified: Secondary | ICD-10-CM

## 2023-09-17 NOTE — Progress Notes (Signed)
 Patient is scheduled for next vaccine in Sept

## 2023-09-18 ENCOUNTER — Ambulatory Visit: Payer: No Typology Code available for payment source

## 2023-09-18 VITALS — BP 128/84 | HR 90 | Temp 98.1°F | Ht 64.0 in | Wt >= 6400 oz

## 2023-09-18 DIAGNOSIS — K0510 Chronic gingivitis, plaque induced: Secondary | ICD-10-CM | POA: Diagnosis not present

## 2023-09-18 MED ORDER — CYANOCOBALAMIN 1000 MCG/ML IJ SOLN
1000.0000 ug | Freq: Once | INTRAMUSCULAR | Status: AC
  Administered 2023-09-18: 1000 ug via INTRAMUSCULAR

## 2023-09-18 NOTE — Progress Notes (Signed)
Patient presents today for 2nd b12 injection.  

## 2023-09-25 ENCOUNTER — Ambulatory Visit (INDEPENDENT_AMBULATORY_CARE_PROVIDER_SITE_OTHER)

## 2023-09-25 VITALS — BP 126/84 | HR 60 | Temp 98.1°F | Ht 64.0 in | Wt >= 6400 oz

## 2023-09-25 DIAGNOSIS — K0510 Chronic gingivitis, plaque induced: Secondary | ICD-10-CM

## 2023-09-25 MED ORDER — CYANOCOBALAMIN 1000 MCG/ML IJ SOLN
1000.0000 ug | Freq: Once | INTRAMUSCULAR | Status: AC
  Administered 2023-09-25: 1000 ug via INTRAMUSCULAR

## 2023-09-25 NOTE — Progress Notes (Signed)
 Patient presents today for 3rd b12 injection. She has appointment with provider on 10/13/2023. Patient aware she will receive 4th b12 at the visit.

## 2023-09-28 ENCOUNTER — Encounter: Payer: Self-pay | Admitting: Nurse Practitioner

## 2023-10-05 ENCOUNTER — Other Ambulatory Visit: Payer: Self-pay | Admitting: Nurse Practitioner

## 2023-10-06 ENCOUNTER — Encounter: Payer: Self-pay | Admitting: Nurse Practitioner

## 2023-10-06 ENCOUNTER — Ambulatory Visit: Admitting: Nurse Practitioner

## 2023-10-06 VITALS — BP 124/80 | HR 88 | Temp 99.1°F | Ht 64.0 in | Wt >= 6400 oz

## 2023-10-06 DIAGNOSIS — D649 Anemia, unspecified: Secondary | ICD-10-CM

## 2023-10-06 DIAGNOSIS — E119 Type 2 diabetes mellitus without complications: Secondary | ICD-10-CM

## 2023-10-06 DIAGNOSIS — K0510 Chronic gingivitis, plaque induced: Secondary | ICD-10-CM | POA: Diagnosis not present

## 2023-10-06 DIAGNOSIS — F329 Major depressive disorder, single episode, unspecified: Secondary | ICD-10-CM

## 2023-10-06 DIAGNOSIS — Z794 Long term (current) use of insulin: Secondary | ICD-10-CM

## 2023-10-06 DIAGNOSIS — D513 Other dietary vitamin B12 deficiency anemia: Secondary | ICD-10-CM | POA: Diagnosis not present

## 2023-10-06 DIAGNOSIS — I10 Essential (primary) hypertension: Secondary | ICD-10-CM | POA: Diagnosis not present

## 2023-10-06 DIAGNOSIS — E1159 Type 2 diabetes mellitus with other circulatory complications: Secondary | ICD-10-CM

## 2023-10-06 DIAGNOSIS — E538 Deficiency of other specified B group vitamins: Secondary | ICD-10-CM

## 2023-10-06 DIAGNOSIS — E66813 Obesity, class 3: Secondary | ICD-10-CM

## 2023-10-06 DIAGNOSIS — J302 Other seasonal allergic rhinitis: Secondary | ICD-10-CM

## 2023-10-06 DIAGNOSIS — G4733 Obstructive sleep apnea (adult) (pediatric): Secondary | ICD-10-CM

## 2023-10-06 DIAGNOSIS — G43709 Chronic migraine without aura, not intractable, without status migrainosus: Secondary | ICD-10-CM

## 2023-10-06 DIAGNOSIS — Z6841 Body Mass Index (BMI) 40.0 and over, adult: Secondary | ICD-10-CM

## 2023-10-06 MED ORDER — CYANOCOBALAMIN 1000 MCG/ML IJ SOLN
1000.0000 ug | Freq: Once | INTRAMUSCULAR | Status: AC
  Administered 2023-10-06: 1000 ug via INTRAMUSCULAR

## 2023-10-06 MED ORDER — UBRELVY 50 MG PO TABS: 1.0000 | ORAL_TABLET | Freq: Every day | ORAL | 2 refills | Status: DC

## 2023-10-06 MED ORDER — AZELASTINE HCL 0.1 % NA SOLN: 2.0000 | Freq: Two times a day (BID) | NASAL | 12 refills | Status: DC

## 2023-10-06 NOTE — Patient Instructions (Signed)

## 2023-10-06 NOTE — Progress Notes (Signed)
 [name], CMA,acting as a Neurosurgeon for [name], FNP.,have documented all relevant documentation on the behalf of [name], FNP,as directed by  [name], FNP while in the presence of [name], FNP.  Subjective:  Patient ID: Emily Phelps , female    DOB: 1981-07-07 , 43 y.o.   MRN: [id number]  Chief Complaint  Patient presents with   B12 Injection   Hypertension   Diabetes    HPI  Patient presents today for a b12 follow up. Her dentist had a concern about her gums being inflamed. She has not had a f/u yet. She thinks it is in June. Patient reports compliance with medication. Patient reports she has been having bad headaches since starting the wellbutrin.  She is taking sumitriptan for her migraines everyday for the last couple weeks. She has taken mucinex. She is taking allegra and zyrtec. She has been taking nyquil honey.   FMLA forms she is now out of work again until next week. She is also needing doctor notes from her visit notes for her short term disability from January 2025. She has been out of work and was supposed to return to work on March 17th. She has authorized time.   [facility] - [phone]    Past Medical History:  Diagnosis Date   Abscess of skin of abdomen 05/15/2022   She has a healing wound to her abdomen, treated with antibiotic. Advised to continue antibiotics until completely gone.   Allergy    Depression    Diabetes mellitus (HCC)    Family history of adverse reaction to anesthesia    mother had n/v after    Family history of breast cancer    Family history of hypertrophic cardiomyopathy 05/13/2021   Family history of kidney cancer    Family history of pancreatic cancer    Family history of prostate cancer    GERD (gastroesophageal reflux disease)    Hyperlipidemia    Hypertension    Legionella pneumonia (HCC) 01/03/2020   Migraine    Obesity    Palpitations 05/13/2021   Panic attacks    Pneumonia    Rectal bleeding     Resistant hypertension 11/28/2014   Sleep apnea    Wears contact lenses      Family History  Problem Relation Age of Onset   Hypertension Mother    Colon polyps Mother    Diabetes Father    Hypertension Father    Prostate cancer Father 52   Pancreatic cancer Father 12   Thyroid disease Maternal Aunt    Heart disease Maternal Uncle    Kidney failure Maternal Uncle    Cancer Paternal Aunt        pat 1/2 aunt with cancer NOS   Pancreatic cancer Paternal Uncle    Hypertension Maternal Grandmother    Heart disease Maternal Grandmother    Hypertrophic cardiomyopathy Maternal Grandmother    Breast cancer Cousin        dx < 50   Breast cancer Cousin        dx < 50   Hypertension Brother    Colon cancer Neg Hx    Esophageal cancer Neg Hx    Liver cancer Neg Hx    Stomach cancer Neg Hx    Rectal cancer Neg Hx      Current Outpatient Medications:    albuterol (VENTOLIN HFA) 108 (90 Base) MCG/ACT inhaler, Inhale 2 puffs into the lungs every 6 (six) hours as needed for wheezing  or shortness of breath., Disp: 6.7 g, Rfl: 0   ALLERGY RELIEF 180 MG tablet, TAKE 1 TABLET BY MOUTH EVERY DAY, Disp: 90 tablet, Rfl: 0   amLODipine (NORVASC) 10 MG tablet, TAKE 1 TABLET BY MOUTH ONCE DAILY, Disp: 90 tablet, Rfl: 3   atenolol (TENORMIN) 100 MG tablet, TAKE 1 TABLET BY MOUTH EVERY DAY, Disp: 90 tablet, Rfl: 3   atorvastatin (LIPITOR) 10 MG tablet, TAKE 1 TABLET BY MOUTH EVERY DAY, Disp: 30 tablet, Rfl: 2   azelastine (ASTELIN) 0.1 % nasal spray, Place 2 sprays into both nostrils 2 (two) times daily. Use in each nostril as directed, Disp: 30 mL, Rfl: 12   buPROPion (WELLBUTRIN XL) 150 MG 24 hr tablet, Take 1 tablet (150 mg total) by mouth every morning., Disp: 30 tablet, Rfl: 2   Continuous Blood Gluc Receiver (DEXCOM G6 RECEIVER) DEVI, Use to check blood sugars dx code e11.65, Disp: 3 each, Rfl: 3   Continuous Glucose Sensor (DEXCOM G6 SENSOR) MISC, USE TO CHECK BLOOD SUGAR AND CHANGE EVERY 10  DAYS, Disp: 3 each, Rfl: 5   Continuous Glucose Transmitter (DEXCOM G6 TRANSMITTER) MISC, USE TO CHECK BLOOD SUGAR. CHANGE EVERY 90 DAYS, Disp: 1 each, Rfl: 3   cyclobenzaprine (FLEXERIL) 10 MG tablet, Take 1 tablet (10 mg total) by mouth 3 (three) times daily as needed for muscle spasms., Disp: 30 tablet, Rfl: 0   dicyclomine (BENTYL) 10 MG capsule, TAKE 1 CAPSULE BY MOUTH 4 TIMES DAILY AS NEEDED FOR SPASMS, Disp: 30 capsule, Rfl: 2   FARXIGA 5 MG TABS tablet, TAKE 1 TABLET BY MOUTH EVERY DAY, Disp: 90 tablet, Rfl: 0   ferrous sulfate 325 (65 FE) MG tablet, Take 325 mg by mouth daily with breakfast., Disp: , Rfl:    Ketoprofen (FROTEK) 10 % CREA, Apply 1 application  topically 4 (four) times daily., Disp: , Rfl:    Lancets (UNILET COMFORTOUCH LANCET) MISC, check blood sugar 3 TIMES DAILY AS DIRECTED, Disp: 200 each, Rfl: 1   Magnesium Glycinate 100 MG CAPS, Take 1 capsule by mouth every evening., Disp: 30 capsule, Rfl: 3   Multiple Vitamin (MULTIVITAMIN WITH MINERALS) TABS tablet, Take 1 tablet by mouth daily., Disp: , Rfl:    mupirocin ointment (BACTROBAN) 2 %, Apply 1 Application topically 2 (two) times daily., Disp: 22 g, Rfl: 0   omeprazole (PRILOSEC) 40 MG capsule, Take 1 capsule (40 mg total) by mouth 2 (two) times daily., Disp: 180 capsule, Rfl: 3   ondansetron (ZOFRAN) 4 MG tablet, Take 1 tablet (4 mg total) by mouth every 4 (four) hours as needed for nausea or vomiting., Disp: 6 tablet, Rfl: 0   progesterone (PROMETRIUM) 100 MG capsule, Take 200 mg by mouth at bedtime., Disp: , Rfl:    spironolactone (ALDACTONE) 50 MG tablet, TAKE 1 TABLET BY MOUTH EVERY DAY, Disp: 90 tablet, Rfl: 3   tamsulosin (FLOMAX) 0.4 MG CAPS capsule, Take 0.4 mg by mouth daily., Disp: , Rfl:    tirzepatide (MOUNJARO) 15 MG/0.5ML Pen, Inject 15 mg into the skin once a week., Disp: 6 mL, Rfl: 1   topiramate (TOPAMAX) 50 MG tablet, TAKE 1 TABLET BY MOUTH 2 TIMES DAILY, Disp: 180 tablet, Rfl: 1   traMADol (ULTRAM) 50  MG tablet, Take 1-2 tablets (50-100 mg total) by mouth every 6 (six) hours as needed for moderate pain., Disp: 15 tablet, Rfl: 0   Ubrogepant (UBRELVY) 50 MG TABS, Take 1 tablet (50 mg total) by mouth daily., Disp: 16 tablet,  Rfl: 2   valsartan (DIOVAN) 320 MG tablet, TAKE 1 TABLET BY MOUTH EVERY DAY, Disp: 90 tablet, Rfl: 3   Vitamin D, Ergocalciferol, (DRISDOL) 1.25 MG (50000 UNIT) CAPS capsule, TAKE 1 CAPSULE BY MOUTH EVERY 7 DAYS, Disp: 12 capsule, Rfl: 1   Allergies  Allergen Reactions   Latex Rash   Silicone Rash    Other reaction(s): Unknown   Tape Rash    Other reaction(s): Unknown Other reaction(s): Unknown     Review of Systems  Constitutional: Negative.   Respiratory: Negative.    Cardiovascular: Negative.   Neurological: Negative.   Psychiatric/Behavioral: Negative.       Today's Vitals   10/06/23 1434  BP: 124/80  Pulse: 88  Temp: 99.1 F (37.3 C)  TempSrc: Oral  Weight: (!) 416 lb (188.7 kg)  Height: 5\' 4"  (1.626 m)  PainSc: 0-No pain   Body mass index is 71.41 kg/m.  Wt Readings from Last 3 Encounters:  10/06/23 (!) 416 lb (188.7 kg)  09/25/23 (!) 415 lb (188.2 kg)  09/18/23 (!) 415 lb (188.2 kg)     Objective:  Physical Exam Vitals and nursing note reviewed.  Constitutional:      General: She is not in acute distress.    Appearance: Normal appearance. She is obese.  Cardiovascular:     Rate and Rhythm: Normal rate and regular rhythm.     Pulses: Normal pulses.     Heart sounds: Normal heart sounds. No murmur heard. Pulmonary:     Effort: Pulmonary effort is normal. No respiratory distress.     Breath sounds: Normal breath sounds. No wheezing.  Musculoskeletal:        General: No tenderness. Normal range of motion.  Skin:    General: Skin is warm and dry.     Capillary Refill: Capillary refill takes less than 2 seconds.     Coloration: Skin is not jaundiced.  Neurological:     General: No focal deficit present.     Mental Status: She is  alert and oriented to person, place, and time.     Cranial Nerves: No cranial nerve deficit.     Motor: No weakness.  Psychiatric:        Mood and Affect: Mood normal.        Behavior: Behavior normal.        Thought Content: Thought content normal.        Judgment: Judgment normal.     Assessment And Plan:  Other dietary vitamin B12 deficiency anemia Assessment & Plan: Will recheck vitamin B12 levels. Vitamin B12 injection given today  Orders: -     Vitamin B12 -     Cyanocobalamin  Essential hypertension Assessment & Plan: B/P is well controlled, advised to focus on lifestyle modification    Gum inflammation Assessment & Plan: She has been receiving vitamin B12 injections. Will consider if she will continue at least monthly.   Orders: -     Vitamin B12 -     Cyanocobalamin  Controlled type 2 diabetes mellitus without complication, with long-term current use of insulin (HCC) -     Hemoglobin A1c -     Lipid panel  Reactive depression Assessment & Plan: Continues with counseling.    Seasonal allergies -     Azelastine HCl; Place 2 sprays into both nostrils 2 (two) times daily. Use in each nostril as directed  Dispense: 30 mL; Refill: 12 -     Ambulatory referral to Allergy  Chronic  migraine without aura without status migrainosus, not intractable Assessment & Plan: She will have episodes of headaches that will keep her out of work for couple days, she had been on imitrex will try her on [place]. Discussed if if needs more than 2 days in row off for her headaches she needs to call the office for an appt. We did have a conversation about her accommodations form, she needs to get up to the bathroom more times  Orders: Bernita Raisin; Take 1 tablet (50 mg total) by mouth daily.  Dispense: 16 tablet; Refill: 2  OSA on CPAP Assessment & Plan: She is advised to use her CPAP as directed.    Class 3 severe obesity due to excess calories with serious comorbidity and  body mass index (BMI) greater than or equal to 70 in adult [facility]) Assessment & Plan: She is encouraged to strive for BMI less than 30 to decrease cardiac risk. Advised to aim for at least 150 minutes of exercise per week.      Return for controlled DM check-4 months.  Patient was given opportunity to ask questions. Patient verbalized understanding of the plan and was able to repeat key elements of the plan. All questions were answered to their satisfaction.    Jeanell Sparrow, FNP, have reviewed all documentation for this visit. The documentation on 10/06/23 for the exam, diagnosis, procedures, and orders are all accurate and complete.   IF YOU HAVE BEEN REFERRED TO A SPECIALIST, IT MAY TAKE 1-2 WEEKS TO SCHEDULE/PROCESS THE REFERRAL. IF YOU HAVE NOT HEARD FROM US/SPECIALIST IN TWO WEEKS, PLEASE GIVE [place] A CALL AT [phone].

## 2023-10-07 LAB — HEMOGLOBIN A1C
Est. average glucose Bld gHb Est-mCnc: 123 mg/dL
Hgb A1c MFr Bld: 5.9 % — ABNORMAL HIGH (ref 4.8–5.6)

## 2023-10-07 LAB — LIPID PANEL
Chol/HDL Ratio: 4.5 ratio — ABNORMAL HIGH (ref 0.0–4.4)
Cholesterol, Total: 139 mg/dL (ref 100–199)
HDL: 31 mg/dL — ABNORMAL LOW (ref >39)
LDL Chol Calc (NIH): 77 mg/dL (ref 0–99)
Triglycerides: 178 mg/dL — ABNORMAL HIGH (ref 0–149)
VLDL Cholesterol Cal: 31 mg/dL (ref 5–40)

## 2023-10-07 LAB — VITAMIN B12: Vitamin B-12: 803 pg/mL (ref 232–1245)

## 2023-10-13 ENCOUNTER — Ambulatory Visit (HOSPITAL_COMMUNITY)
Admission: RE | Admit: 2023-10-13 | Discharge: 2023-10-13 | Disposition: A | Discharge status: Home / Self Care | Source: Ambulatory Visit | Attending: Physician Assistant | Admitting: Physician Assistant

## 2023-10-13 ENCOUNTER — Ambulatory Visit: Payer: No Typology Code available for payment source | Admitting: Nurse Practitioner

## 2023-10-13 DIAGNOSIS — K760 Fatty (change of) liver, not elsewhere classified: Secondary | ICD-10-CM | POA: Insufficient documentation

## 2023-10-13 DIAGNOSIS — R160 Hepatomegaly, not elsewhere classified: Secondary | ICD-10-CM | POA: Insufficient documentation

## 2023-10-18 ENCOUNTER — Encounter: Payer: Self-pay | Admitting: Nurse Practitioner

## 2023-10-18 DIAGNOSIS — E538 Deficiency of other specified B group vitamins: Secondary | ICD-10-CM | POA: Insufficient documentation

## 2023-10-18 NOTE — Assessment & Plan Note (Signed)
 Continues with counseling.

## 2023-10-18 NOTE — Assessment & Plan Note (Signed)
 She has been receiving vitamin B12 injections. Will consider if she will continue at least monthly.

## 2023-10-18 NOTE — Assessment & Plan Note (Signed)
 She is encouraged to strive for BMI less than 30 to decrease cardiac risk. Advised to aim for at least 150 minutes of exercise per week.

## 2023-10-18 NOTE — Assessment & Plan Note (Signed)
 B/P is well controlled, advised to focus on lifestyle modification

## 2023-10-18 NOTE — Assessment & Plan Note (Signed)
 She is advised to use her CPAP as directed.

## 2023-10-18 NOTE — Assessment & Plan Note (Addendum)
 She will have episodes of headaches that will keep her out of work for couple days, she had been on imitrex will try her on [place]. Discussed if if needs more than 2 days in row off for her headaches she needs to call the office for an appt. We did have a conversation about her accommodations form, she needs to get up to the bathroom more times

## 2023-10-18 NOTE — Assessment & Plan Note (Signed)
 Will recheck vitamin B12 levels. Vitamin B12 injection given today

## 2023-10-27 ENCOUNTER — Ambulatory Visit: Admitting: Internal Medicine

## 2023-10-27 VITALS — BP 126/82 | HR 86 | Temp 98.4°F | Ht 64.0 in | Wt >= 6400 oz

## 2023-10-27 DIAGNOSIS — J3089 Other allergic rhinitis: Secondary | ICD-10-CM | POA: Diagnosis not present
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
## 2023-10-27 NOTE — Patient Instructions (Addendum)
 Other Allergic Rhinitis: - Use nasal saline rinses before nose sprays such as with Neilmed Sinus Rinse.  Use distilled water.   - Use Azelastine 2 sprays each nostril twice daily as needed for runny nose, drainage, sneezing, congestion. Aim upward and outward. - Use Zyrtec 10 mg or Xyzal 5mg  daily.    Hold all anti-histamines (Azelastine spray, Xyzal, Allegra, Zyrtec, Claritin, Benadryl, Pepcid) 3 days prior to next visit.  Follow up: 4/22 at 9 AM for skin testing 1-55

## 2023-10-29 ENCOUNTER — Other Ambulatory Visit: Payer: Self-pay | Admitting: Nurse Practitioner

## 2023-11-03 ENCOUNTER — Ambulatory Visit: Admitting: Internal Medicine

## 2023-11-03 DIAGNOSIS — J3089 Other allergic rhinitis: Secondary | ICD-10-CM

## 2023-11-03 MED ORDER — FLUTICASONE PROPIONATE 50 MCG/ACT NA SUSP: 2.0000 | Freq: Every day | NASAL | 11 refills | Status: DC

## 2023-11-03 MED ORDER — FEXOFENADINE HCL 180 MG PO TABS
180.0000 mg | ORAL_TABLET | Freq: Every day | ORAL | 3 refills | Status: AC
Start: 2023-11-03 — End: ?

## 2023-11-03 NOTE — Patient Instructions (Addendum)
 Chronic Rhinitis: - Positive skin test 10/2023: none  - Use nasal saline rinses before nose sprays such as with Neilmed Sinus Rinse.  Use distilled water.   - Use Flonase  2 sprays each nostril daily. Aim upward and outward. - Use Azelastine  2 sprays each nostril twice daily as needed for runny nose, drainage, sneezing, congestion. Aim upward and outward. - Use Allegra  180mg  daily

## 2023-11-03 NOTE — Progress Notes (Signed)
 FOLLOW UP Date of Service/Encounter:  11/03/23   Subjective:  Emily Phelps (DOB: June 29, 1981) is a 43 y.o. female who [facility] for follow up for skin testing.   History obtained from: chart review and patient.  Anti histamines held.   Past Medical History: Past Medical History:  Diagnosis Date   Abscess of skin of abdomen 05/15/2022   She has a healing wound to her abdomen, treated with antibiotic. Advised to continue antibiotics until completely gone.   Allergy     Depression    Diabetes mellitus (HCC)    Family history of adverse reaction to anesthesia    mother had n/v after    Family history of breast cancer    Family history of hypertrophic cardiomyopathy 05/13/2021   Family history of kidney cancer    Family history of pancreatic cancer    Family history of prostate cancer    GERD (gastroesophageal reflux disease)    Hyperlipidemia    Hypertension    Legionella pneumonia (HCC) 01/03/2020   Migraine    Obesity    Palpitations 05/13/2021   Panic attacks    Pneumonia    Rectal bleeding    Resistant hypertension 11/28/2014   Sleep apnea    Wears contact lenses     Objective:  LMP 09/28/2023 (Approximate)  There is no height or weight on file to calculate BMI. Physical Exam: GEN: alert, well developed HEENT: clear conjunctiva, MMM LUNGS: unlabored respiration  Skin Testing:  Skin prick testing was placed, which includes aeroallergens/foods, histamine control, and saline control.  Verbal consent was obtained prior to placing test.  Patient tolerated procedure well.  Allergy  testing results were read and interpreted by myself, documented by clinical staff. Adequate positive and negative control.  Positive results to:  Results discussed with patient/family.  Airborne Adult Perc - 11/03/23 0911     Time Antigen Placed 0911    Allergen Manufacturer Floyd Hutchinson    Location Back    Number of Test 55    Panel 1 Select     2. Control-Histamine 3+    3. Bahia Negative    4. French Southern Territories Negative    5. Johnson Negative    6. [place]  Blue Negative    7. Meadow Fescue Negative    8. Perennial Rye Negative    9. Timothy Negative    10. Ragweed Mix Negative    11. Cocklebur Negative    12. Plantain,  English Negative    13. Baccharis Negative    14. Dog Fennel Negative    15. Russian Thistle Negative    16. Lamb's Quarters Negative    17. Sheep Sorrell Negative    18. Rough Pigweed Negative    19. Marsh Elder, Rough Negative    20. Mugwort, Common Negative    21. Box, Elder Negative    22. Cedar, red Negative    23. Sweet Gum Negative    24. Pecan Pollen Negative    25. Pine Mix Negative    26. Walnut, Black Pollen Negative    27. Red Mulberry Negative    28. Ash Mix Negative    29. Birch Mix Negative    30. Beech American Negative    31. Cottonwood, [place] Negative    32. Hickory, White Negative    33. Maple Mix Negative    34. Oak, [place] Mix Negative    35. Sycamore Eastern Negative    36. Alternaria Alternata Negative  37. Cladosporium Herbarum Negative    38. Aspergillus Mix Negative    39. Penicillium Mix Negative    40. Bipolaris Sorokiniana (Helminthosporium) Negative    41. Drechslera Spicifera (Curvularia) Negative    42. Mucor Plumbeus Negative    43. Fusarium Moniliforme Negative    44. Aureobasidium Pullulans (pullulara) Negative    45. Rhizopus Oryzae Negative    46. Botrytis Cinera Negative    47. Epicoccum Nigrum Negative    48. Phoma Betae Negative    49. Dust Mite Mix Negative    50. Cat Hair 10,000 BAU/ml Negative    51.  Dog Epithelia Negative    52. Mixed Feathers Negative    53. Horse Epithelia Negative    54. Cockroach, German Negative    55. Tobacco Leaf Negative             Intradermal - 11/03/23 0946     Time Antigen Placed 0946    Location Arm    Number of Test 16    Intradermal Select    Control Negative    Bahia Negative    French Southern Territories Negative     Johnson Negative    7 Grass Negative    Ragweed Mix Negative    Weed Mix Negative    Tree Mix Negative    Mold 1 Negative    Mold 2 Negative    Mold 3 Negative    Mold 4 Negative    Mite Mix Negative    Cat Negative    Dog Negative    Cockroach Negative              Assessment:   1. Other allergic rhinitis     Plan/Recommendations:  Other Allergic Rhinitis: - Due to turbinate hypertrophy, seasonal symptoms, worsening headaches and unresponsive to over the counter meds, will perform skin testing to identify aeroallergen triggers.   - Positive skin test 10/2023: none  - Use nasal saline rinses before nose sprays such as with Neilmed Sinus Rinse.  Use distilled water.   - Use Flonase  2 sprays each nostril daily. Aim upward and outward. - Use Azelastine  2 sprays each nostril twice daily as needed for runny nose, drainage, sneezing, congestion. Aim upward and outward. - Use Allegra  180mg  daily      Return in about 6 months (around 05/04/2024).  Kristen Petri, MD [facility] 

## 2023-11-09 ENCOUNTER — Other Ambulatory Visit (HOSPITAL_BASED_OUTPATIENT_CLINIC_OR_DEPARTMENT_OTHER): Payer: Self-pay | Admitting: Cardiovascular Disease

## 2023-11-09 ENCOUNTER — Other Ambulatory Visit: Payer: Self-pay | Admitting: Nurse Practitioner

## 2023-11-09 DIAGNOSIS — F329 Major depressive disorder, single episode, unspecified: Secondary | ICD-10-CM

## 2023-11-09 DIAGNOSIS — F4321 Adjustment disorder with depressed mood: Secondary | ICD-10-CM

## 2023-11-10 ENCOUNTER — Ambulatory Visit (INDEPENDENT_AMBULATORY_CARE_PROVIDER_SITE_OTHER): Payer: Self-pay

## 2023-11-10 VITALS — BP 130/70 | HR 108 | Temp 98.2°F | Ht 64.0 in | Wt >= 6400 oz

## 2023-11-10 DIAGNOSIS — E538 Deficiency of other specified B group vitamins: Secondary | ICD-10-CM

## 2023-11-10 MED ORDER — CYANOCOBALAMIN 1000 MCG/ML IJ SOLN
1000.0000 ug | Freq: Once | INTRAMUSCULAR | Status: AC
  Administered 2023-11-10: 1000 ug via INTRAMUSCULAR

## 2023-11-10 NOTE — Progress Notes (Signed)
Patient presents today for a vitamin b12 injection. YL,RMA 

## 2023-11-10 NOTE — Patient Instructions (Signed)
 Vitamin B12 Deficiency Vitamin B12 deficiency means that your body does not have enough vitamin B12. The body needs this important vitamin: To make red blood cells. To make genes (DNA). To help the nerves work. If you do not have enough vitamin B12 in your body, you can have health problems, such as not having enough red blood cells in the blood (anemia). What are the causes? Not eating enough foods that contain vitamin B12. Not being able to take in (absorb) vitamin B12 from the food that you eat. Certain diseases. A condition in which the body does not make enough of a certain protein. This results in your body not taking in enough vitamin B12. Having a surgery in which part of the stomach or small intestine is taken out. Taking medicines that make it hard for the body to take in vitamin B12. These include: Heartburn medicines. Some medicines that are used to treat diabetes. What increases the risk? Being an older adult. Eating a vegetarian or vegan diet that does not include any foods that come from animals. Not eating enough foods that contain vitamin B12 while you are pregnant. Taking certain medicines. Having alcoholism. What are the signs or symptoms? In some cases, there are no symptoms. If the condition leads to too few blood cells or nerve damage, symptoms can occur, such as: Feeling weak or tired. Not being hungry. Losing feeling (numbness) or tingling in your hands and feet. Redness and burning of the tongue. Feeling sad (depressed). Confusion or memory problems. Trouble walking. If anemia is very bad, symptoms can include: Being short of breath. Being dizzy. Having a very fast heartbeat. How is this treated? Changing the way you eat and drink, such as: Eating more foods that contain vitamin B12. Drinking little or no alcohol. Getting vitamin B12 shots. Taking vitamin B12 supplements by mouth (orally). Your doctor will tell you the dose that is best for you. Follow  these instructions at home: Eating and drinking  Eat foods that come from animals and have a lot of vitamin B12 in them. These include: Meats and poultry. This includes beef, pork, chicken, [place], and organ meats, such as liver. Seafood, such as clams, rainbow trout, salmon, tuna, and haddock. Eggs. Dairy foods such as milk, yogurt, and cheese. Eat breakfast cereals that have vitamin B12 added to them (are fortified). Check the label. The items listed above may not be a complete list of foods and beverages you can eat and drink. Contact a dietitian for more information. Alcohol use Do not drink alcohol if: Your doctor tells you not to drink. You are pregnant, may be pregnant, or are planning to become pregnant. If you drink alcohol: Limit how much you have to: 0-1 drink a day for women. 0-2 drinks a day for men. Know how much alcohol is in your drink. In the [place] one drink equals one 12 oz bottle of beer (355 mL), one 5 oz glass of wine (148 mL), or one 1 oz glass of hard liquor (44 mL). General instructions Get any vitamin B12 shots if told by your doctor. Take supplements only as told by your doctor. Follow the directions. Keep all follow-up visits. Contact a doctor if: Your symptoms come back. Your symptoms get worse or do not get better with treatment. Get help right away if: You have trouble breathing. You have a very fast heartbeat. You have chest pain. You get dizzy. You faint. These symptoms may be an emergency. Get help right away. Call 911.  Do not wait to see if the symptoms will go away. Do not drive yourself to the hospital. Summary Vitamin B12 deficiency means that your body is not getting enough of the vitamin. In some cases, there are no symptoms of this condition. Treatment may include making a change in the way you eat and drink, getting shots, or taking supplements. Eat foods that have vitamin B12 in them. This information is not intended to replace advice  given to you by your health care provider. Make sure you discuss any questions you have with your health care provider. Document Revised: 02/22/2021 Document Reviewed: 02/22/2021 Elsevier Patient Education  2024 [employer].

## 2023-11-30 ENCOUNTER — Other Ambulatory Visit: Payer: Self-pay | Admitting: Physician Assistant

## 2023-11-30 ENCOUNTER — Other Ambulatory Visit: Payer: Self-pay | Admitting: Nurse Practitioner

## 2023-11-30 DIAGNOSIS — R1084 Generalized abdominal pain: Secondary | ICD-10-CM

## 2023-11-30 NOTE — Progress Notes (Unsigned)
 Emily Phelps, CMA,acting as a Neurosurgeon for Emily Epley, FNP.,have documented all relevant documentation on the behalf of Emily Epley, FNP,as directed by  Emily Epley, FNP while in the presence of Emily Epley, FNP.  Subjective:  Patient ID: Emily Phelps , female    DOB: 21-Apr-1981 , 43 y.o.   MRN: [id number]  Chief Complaint  Patient presents with   Grief    Patient presents today for a grief follow up, patient reports she is feeling better. Patient reports she needs her accommodations explained more.   She's FMLA for Migraines.  Accommodations for bathroom breaks and to work from home. She needs 4 15 min breaks for the bathroom.     HPI  Her FMLA for her migraines expired 5/20. She is having migraines daily and will take ubrelvy . When she first wakes up she will have a migraine. This occurs 3-4 times a week. She continues to take topirimate. Photophobia. Will keep a small lamp on. Has had some nausea. She will have a decreased appetite during exacerbations.   Her return to work accomodation they want them so she can put in her bathroom breaks themselves. 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 She is working from home, they want it to say go as needed and she can schedule or pre-schedule the breaks.   She is back at work since March. She continues to see the therapist once a week. She has 3 forms that need to be completed, 1 FMLA, 1 Accommodation and Medical assessment form in response to accomodation request. She has not been back to the office since Covid, she had continued to be sick initially when employees returned to work. She will go in to the office 1 day a week for issues with computer. She has volunteered in [place] as well.  She does not feel there is not a reason to go back into the office. Recommendation to manage medical breaks independently.      Past Medical History:  Diagnosis Date   Abscess of skin of abdomen 05/15/2022   She has a healing wound to her abdomen,  treated with antibiotic. Advised to continue antibiotics until completely gone.   Allergy     Depression    Diabetes mellitus (HCC)    Family history of adverse reaction to anesthesia    mother had n/v after    Family history of breast cancer    Family history of hypertrophic cardiomyopathy 05/13/2021   Family history of kidney cancer    Family history of pancreatic cancer    Family history of prostate cancer    GERD (gastroesophageal reflux disease)    Hyperlipidemia    Hypertension    Legionella pneumonia (HCC) 01/03/2020   Migraine    Obesity    Palpitations 05/13/2021   Panic attacks    Pneumonia    Rectal bleeding    Resistant hypertension 11/28/2014   Sleep apnea    Wears contact lenses      Family History  Problem Relation Age of Onset   Hypertension Mother    Colon polyps Mother    Diabetes Father    Hypertension Father    Prostate cancer Father 35   Pancreatic cancer Father 9   Thyroid  disease Maternal Aunt    Heart disease Maternal Uncle    Kidney failure Maternal Uncle    Cancer Paternal Aunt        pat 1/2 aunt with cancer NOS   Pancreatic cancer Paternal Uncle  Hypertension Maternal Grandmother    Heart disease Maternal Grandmother    Hypertrophic cardiomyopathy Maternal Grandmother    Breast cancer Cousin        dx < 50   Breast cancer Cousin        dx < 50   Hypertension Brother    Colon cancer Neg Hx    Esophageal cancer Neg Hx    Liver cancer Neg Hx    Stomach cancer Neg Hx    Rectal cancer Neg Hx      Current Outpatient Medications:    albuterol  (VENTOLIN  HFA) 108 (90 Base) MCG/ACT inhaler, Inhale 2 puffs into the lungs every 6 (six) hours as needed for wheezing or shortness of breath., Disp: 6.7 g, Rfl: 0   amLODipine  (NORVASC ) 10 MG tablet, TAKE 1 TABLET BY MOUTH ONCE DAILY, Disp: 90 tablet, Rfl: 3   atenolol  (TENORMIN ) 100 MG tablet, TAKE 1 TABLET BY MOUTH EVERY DAY, Disp: 90 tablet, Rfl: 3   atorvastatin  (LIPITOR) 10 MG tablet, TAKE  1 TABLET BY MOUTH EVERY DAY, Disp: 30 tablet, Rfl: 2   azelastine  (ASTELIN ) 0.1 % nasal spray, Place 2 sprays into both nostrils 2 (two) times daily. Use in each nostril as directed, Disp: 30 mL, Rfl: 12   buPROPion  (WELLBUTRIN  XL) 150 MG 24 hr tablet, TAKE 1 TABLET BY MOUTH EVERY MORNING, Disp: 30 tablet, Rfl: 2   Continuous Blood Gluc Receiver (DEXCOM G6 RECEIVER) DEVI, Use to check blood sugars dx code e11.65, Disp: 3 each, Rfl: 3   Continuous Glucose Sensor (DEXCOM G6 SENSOR) MISC, USE TO CHECK BLOOD SUGAR AND CHANGE EVERY 10 DAYS, Disp: 3 each, Rfl: 5   Continuous Glucose Transmitter (DEXCOM G6 TRANSMITTER) MISC, USE TO CHECK BLOOD SUGAR. CHANGE EVERY 90 DAYS, Disp: 1 each, Rfl: 3   cyclobenzaprine  (FLEXERIL ) 10 MG tablet, Take 1 tablet (10 mg total) by mouth 3 (three) times daily as needed for muscle spasms., Disp: 30 tablet, Rfl: 0   dicyclomine  (BENTYL ) 10 MG capsule, TAKE 1 CAPSULE BY MOUTH 4 TIMES DAILY AS NEEDED FOR SPASMS, Disp: 30 capsule, Rfl: 2   ferrous sulfate 325 (65 FE) MG tablet, Take 325 mg by mouth daily with breakfast., Disp: , Rfl:    fexofenadine  (ALLERGY  RELIEF) 180 MG tablet, Take 1 tablet (180 mg total) by mouth daily., Disp: 90 tablet, Rfl: 3   fluticasone  (FLONASE ) 50 MCG/ACT nasal spray, Place 2 sprays into both nostrils daily., Disp: 16 g, Rfl: 11   Ketoprofen (FROTEK) 10 % CREA, Apply 1 application  topically 4 (four) times daily., Disp: , Rfl:    Lancets (UNILET COMFORTOUCH LANCET) MISC, check blood sugar 3 TIMES DAILY AS DIRECTED, Disp: 200 each, Rfl: 1   Magnesium  Glycinate 100 MG CAPS, Take 1 capsule by mouth every evening., Disp: 30 capsule, Rfl: 3   Multiple Vitamin (MULTIVITAMIN WITH MINERALS) TABS tablet, Take 1 tablet by mouth daily., Disp: , Rfl:    mupirocin  ointment (BACTROBAN ) 2 %, Apply 1 Application topically 2 (two) times daily., Disp: 22 g, Rfl: 0   omeprazole  (PRILOSEC) 40 MG capsule, Take 1 capsule (40 mg total) by mouth 2 (two) times daily., Disp:  180 capsule, Rfl: 3   ondansetron  (ZOFRAN ) 4 MG tablet, Take 1 tablet (4 mg total) by mouth every 4 (four) hours as needed for nausea or vomiting., Disp: 6 tablet, Rfl: 0   progesterone  (PROMETRIUM ) 100 MG capsule, Take 200 mg by mouth at bedtime., Disp: , Rfl:    progesterone  (PROMETRIUM ) 100  MG capsule, Take 2 capsules by mouth daily., Disp: , Rfl:    spironolactone  (ALDACTONE ) 50 MG tablet, TAKE 1 TABLET BY MOUTH EVERY DAY, Disp: 90 tablet, Rfl: 3   tamsulosin (FLOMAX) 0.4 MG CAPS capsule, Take 0.4 mg by mouth daily., Disp: , Rfl:    tirzepatide  (MOUNJARO ) 15 MG/0.5ML Pen, inject 15 MG into THE SKIN ONCE WEEKLY, Disp: 6 mL, Rfl: 3   topiramate  (TOPAMAX ) 50 MG tablet, TAKE 1 TABLET BY MOUTH 2 TIMES DAILY, Disp: 180 tablet, Rfl: 1   traMADol  (ULTRAM ) 50 MG tablet, Take 1-2 tablets (50-100 mg total) by mouth every 6 (six) hours as needed for moderate pain., Disp: 15 tablet, Rfl: 0   Ubrogepant  (UBRELVY ) 50 MG TABS, Take 1 tablet (50 mg total) by mouth daily., Disp: 16 tablet, Rfl: 2   valsartan  (DIOVAN ) 320 MG tablet, TAKE 1 TABLET BY MOUTH EVERY DAY, Disp: 90 tablet, Rfl: 3   Vitamin D , Ergocalciferol , (DRISDOL ) 1.25 MG (50000 UNIT) CAPS capsule, TAKE 1 CAPSULE BY MOUTH EVERY 7 DAYS, Disp: 12 capsule, Rfl: 1   FARXIGA  5 MG TABS tablet, TAKE 1 TABLET BY MOUTH EVERY DAY, Disp: 90 tablet, Rfl: 0   Allergies  Allergen Reactions   Latex Rash   Silicone Rash    Other reaction(s): Unknown   Tape Rash    Other reaction(s): Unknown Other reaction(s): Unknown     Review of Systems  Constitutional: Negative.   Respiratory: Negative.    Cardiovascular: Negative.   Musculoskeletal: Negative.   Neurological: Negative.   Psychiatric/Behavioral: Negative.       Today's Vitals   12/01/23 1153  BP: 124/80  Pulse: (!) 103  Temp: 98.7 F (37.1 C)  TempSrc: Oral  Weight: (!) 417 lb 12.8 oz (189.5 kg)  Height: 5\' 4"  (1.626 m)  PainSc: 0-No pain   Body mass index is 71.72 kg/m.  Wt Readings  from Last 3 Encounters:  12/01/23 (!) 417 lb 12.8 oz (189.5 kg)  11/10/23 (!) 411 lb (186.4 kg)  10/27/23 (!) 411 lb 1 oz (186.5 kg)     Objective:  Physical Exam Vitals and nursing note reviewed.  Constitutional:      General: She is not in acute distress.    Appearance: Normal appearance. She is obese.  Cardiovascular:     Rate and Rhythm: Normal rate and regular rhythm.     Pulses: Normal pulses.     Heart sounds: Normal heart sounds. No murmur heard. Pulmonary:     Effort: Pulmonary effort is normal. No respiratory distress.     Breath sounds: Normal breath sounds. No wheezing.  Musculoskeletal:        General: No tenderness. Normal range of motion.  Skin:    General: Skin is warm and dry.     Capillary Refill: Capillary refill takes less than 2 seconds.     Coloration: Skin is not jaundiced.  Neurological:     General: No focal deficit present.     Mental Status: She is alert and oriented to person, place, and time.     Cranial Nerves: No cranial nerve deficit.     Motor: No weakness.  Psychiatric:        Mood and Affect: Mood normal.        Behavior: Behavior normal.        Thought Content: Thought content normal.        Judgment: Judgment normal.         Assessment And Plan:  Grief Assessment &  Plan: Continues to have grief related to her father's passing however she is back at work.  Continues with her counselor.   Anxiety Assessment & Plan: Related to the loss of her father.  Continue current medications.   Vitamin B12 deficiency Assessment & Plan: Will administer vitamin B injection and check her vitamin B12 levels  Orders: -     Cyanocobalamin  -     Vitamin B12; Future  Other migraine without status migrainosus, not intractable Assessment & Plan: She has been having more frequent migraines without relief with the topiramate .  I will refer her to neurology for further evaluation as this is causing her to be out of work more frequently.  She can  continue taking the Ubrelvy  as needed.  Will also complete her FMLA form  Orders: -     Ambulatory referral to Neurology  OSA on CPAP Assessment & Plan: She is doing well on her CPAP.  She reports having improved quality of life when using.     Return for keep same next.  Patient was given opportunity to ask questions. Patient verbalized understanding of the plan and was able to repeat key elements of the plan. All questions were answered to their satisfaction.    Inge Mangle, FNP, have reviewed all documentation for this visit. The documentation on 12/01/23 for the exam, diagnosis, procedures, and orders are all accurate and complete.   IF YOU HAVE BEEN REFERRED TO A SPECIALIST, IT MAY TAKE 1-2 WEEKS TO SCHEDULE/PROCESS THE REFERRAL. IF YOU HAVE NOT HEARD FROM US /SPECIALIST IN TWO WEEKS, PLEASE GIVE US  A CALL AT [phone].

## 2023-12-01 ENCOUNTER — Encounter: Payer: Self-pay | Admitting: Nurse Practitioner

## 2023-12-01 ENCOUNTER — Ambulatory Visit

## 2023-12-01 ENCOUNTER — Ambulatory Visit: Admitting: Nurse Practitioner

## 2023-12-01 VITALS — BP 124/80 | HR 103 | Temp 98.7°F | Ht 64.0 in | Wt >= 6400 oz

## 2023-12-01 DIAGNOSIS — F4321 Adjustment disorder with depressed mood: Secondary | ICD-10-CM

## 2023-12-01 DIAGNOSIS — G43809 Other migraine, not intractable, without status migrainosus: Secondary | ICD-10-CM | POA: Diagnosis not present

## 2023-12-01 DIAGNOSIS — E538 Deficiency of other specified B group vitamins: Secondary | ICD-10-CM | POA: Diagnosis not present

## 2023-12-01 DIAGNOSIS — F419 Anxiety disorder, unspecified: Secondary | ICD-10-CM

## 2023-12-01 DIAGNOSIS — G4733 Obstructive sleep apnea (adult) (pediatric): Secondary | ICD-10-CM

## 2023-12-01 MED ORDER — CYANOCOBALAMIN 1000 MCG/ML IJ SOLN
1000.0000 ug | Freq: Once | INTRAMUSCULAR | Status: AC
  Administered 2023-12-01: 1000 ug via INTRAMUSCULAR

## 2023-12-08 ENCOUNTER — Ambulatory Visit: Admitting: Nurse Practitioner

## 2023-12-08 ENCOUNTER — Other Ambulatory Visit: Payer: Self-pay | Admitting: Nurse Practitioner

## 2023-12-08 DIAGNOSIS — E119 Type 2 diabetes mellitus without complications: Secondary | ICD-10-CM

## 2023-12-10 NOTE — Assessment & Plan Note (Signed)
 She has been having more frequent migraines without relief with the topiramate .  I will refer her to neurology for further evaluation as this is causing her to be out of work more frequently.  She can continue taking the Ubrelvy  as needed.  Will also complete her FMLA form

## 2023-12-10 NOTE — Assessment & Plan Note (Signed)
 Related to the loss of her father.  Continue current medications.

## 2023-12-10 NOTE — Assessment & Plan Note (Signed)
 Continues to have grief related to her father's passing however she is back at work.  Continues with her counselor.

## 2023-12-10 NOTE — Assessment & Plan Note (Signed)
 Will administer vitamin B injection and check her vitamin B12 levels

## 2023-12-10 NOTE — Assessment & Plan Note (Signed)
 She is doing well on her CPAP.  She reports having improved quality of life when using.

## 2024-01-05 ENCOUNTER — Ambulatory Visit: Payer: Self-pay

## 2024-01-05 ENCOUNTER — Ambulatory Visit (INDEPENDENT_AMBULATORY_CARE_PROVIDER_SITE_OTHER): Payer: Self-pay

## 2024-01-05 DIAGNOSIS — E538 Deficiency of other specified B group vitamins: Secondary | ICD-10-CM

## 2024-01-05 MED ORDER — CYANOCOBALAMIN 1000 MCG/ML IJ SOLN
1000.0000 ug | Freq: Once | INTRAMUSCULAR | Status: AC
  Administered 2024-01-05: 1000 ug via INTRAMUSCULAR

## 2024-01-05 NOTE — Progress Notes (Signed)
 Pt here for monthly B12 injection per   B12 1000mcg given IM and pt tolerated injection well.  Next B12 injection scheduled for 02/23/24.

## 2024-01-05 NOTE — Patient Instructions (Signed)
 Vitamin B12 Deficiency Vitamin B12 deficiency means that your body does not have enough vitamin B12. The body needs this important vitamin: To make red blood cells. To make genes (DNA). To help the nerves work. If you do not have enough vitamin B12 in your body, you can have health problems, such as not having enough red blood cells in the blood (anemia). What are the causes? Not eating enough foods that contain vitamin B12. Not being able to take in (absorb) vitamin B12 from the food that you eat. Certain diseases. A condition in which the body does not make enough of a certain protein. This results in your body not taking in enough vitamin B12. Having a surgery in which part of the stomach or small intestine is taken out. Taking medicines that make it hard for the body to take in vitamin B12. These include: Heartburn medicines. Some medicines that are used to treat diabetes. What increases the risk? Being an older adult. Eating a vegetarian or vegan diet that does not include any foods that come from animals. Not eating enough foods that contain vitamin B12 while you are pregnant. Taking certain medicines. Having alcoholism. What are the signs or symptoms? In some cases, there are no symptoms. If the condition leads to too few blood cells or nerve damage, symptoms can occur, such as: Feeling weak or tired. Not being hungry. Losing feeling (numbness) or tingling in your hands and feet. Redness and burning of the tongue. Feeling sad (depressed). Confusion or memory problems. Trouble walking. If anemia is very bad, symptoms can include: Being short of breath. Being dizzy. Having a very fast heartbeat. How is this treated? Changing the way you eat and drink, such as: Eating more foods that contain vitamin B12. Drinking little or no alcohol. Getting vitamin B12 shots. Taking vitamin B12 supplements by mouth (orally). Your doctor will tell you the dose that is best for you. Follow  these instructions at home: Eating and drinking  Eat foods that come from animals and have a lot of vitamin B12 in them. These include: Meats and poultry. This includes beef, pork, chicken, [place], and organ meats, such as liver. Seafood, such as clams, rainbow trout, salmon, tuna, and haddock. Eggs. Dairy foods such as milk, yogurt, and cheese. Eat breakfast cereals that have vitamin B12 added to them (are fortified). Check the label. The items listed above may not be a complete list of foods and beverages you can eat and drink. Contact a dietitian for more information. Alcohol use Do not drink alcohol if: Your doctor tells you not to drink. You are pregnant, may be pregnant, or are planning to become pregnant. If you drink alcohol: Limit how much you have to: 0-1 drink a day for women. 0-2 drinks a day for men. Know how much alcohol is in your drink. In the [place] one drink equals one 12 oz bottle of beer (355 mL), one 5 oz glass of wine (148 mL), or one 1 oz glass of hard liquor (44 mL). General instructions Get any vitamin B12 shots if told by your doctor. Take supplements only as told by your doctor. Follow the directions. Keep all follow-up visits. Contact a doctor if: Your symptoms come back. Your symptoms get worse or do not get better with treatment. Get help right away if: You have trouble breathing. You have a very fast heartbeat. You have chest pain. You get dizzy. You faint. These symptoms may be an emergency. Get help right away. Call 911.  Do not wait to see if the symptoms will go away. Do not drive yourself to the hospital. Summary Vitamin B12 deficiency means that your body is not getting enough of the vitamin. In some cases, there are no symptoms of this condition. Treatment may include making a change in the way you eat and drink, getting shots, or taking supplements. Eat foods that have vitamin B12 in them. This information is not intended to replace advice  given to you by your health care provider. Make sure you discuss any questions you have with your health care provider. Document Revised: 02/22/2021 Document Reviewed: 02/22/2021 Elsevier Patient Education  2024 [employer].

## 2024-01-06 LAB — VITAMIN B12: Vitamin B-12: 718 pg/mL (ref 232–1245)

## 2024-01-12 ENCOUNTER — Other Ambulatory Visit: Payer: Self-pay | Admitting: Nurse Practitioner

## 2024-01-12 DIAGNOSIS — G43709 Chronic migraine without aura, not intractable, without status migrainosus: Secondary | ICD-10-CM

## 2024-01-12 DIAGNOSIS — Z794 Long term (current) use of insulin: Secondary | ICD-10-CM

## 2024-02-02 ENCOUNTER — Other Ambulatory Visit: Payer: Self-pay | Admitting: Nurse Practitioner

## 2024-02-02 ENCOUNTER — Other Ambulatory Visit (HOSPITAL_BASED_OUTPATIENT_CLINIC_OR_DEPARTMENT_OTHER): Payer: Self-pay | Admitting: Cardiovascular Disease

## 2024-02-02 DIAGNOSIS — F329 Major depressive disorder, single episode, unspecified: Secondary | ICD-10-CM

## 2024-02-02 DIAGNOSIS — F4321 Adjustment disorder with depressed mood: Secondary | ICD-10-CM

## 2024-02-09 ENCOUNTER — Ambulatory Visit: Admitting: Nurse Practitioner

## 2024-02-23 ENCOUNTER — Encounter: Payer: Self-pay | Admitting: Nurse Practitioner

## 2024-02-23 ENCOUNTER — Ambulatory Visit: Admitting: Nurse Practitioner

## 2024-02-23 VITALS — BP 124/70 | HR 84 | Temp 98.6°F | Ht 64.0 in | Wt >= 6400 oz

## 2024-02-23 DIAGNOSIS — E559 Vitamin D deficiency, unspecified: Secondary | ICD-10-CM

## 2024-02-23 DIAGNOSIS — Z6841 Body Mass Index (BMI) 40.0 and over, adult: Secondary | ICD-10-CM

## 2024-02-23 DIAGNOSIS — G4733 Obstructive sleep apnea (adult) (pediatric): Secondary | ICD-10-CM | POA: Diagnosis not present

## 2024-02-23 DIAGNOSIS — E1169 Type 2 diabetes mellitus with other specified complication: Secondary | ICD-10-CM | POA: Diagnosis not present

## 2024-02-23 DIAGNOSIS — Z794 Long term (current) use of insulin: Secondary | ICD-10-CM

## 2024-02-23 DIAGNOSIS — I10 Essential (primary) hypertension: Secondary | ICD-10-CM

## 2024-02-23 DIAGNOSIS — E538 Deficiency of other specified B group vitamins: Secondary | ICD-10-CM | POA: Diagnosis not present

## 2024-02-23 DIAGNOSIS — L603 Nail dystrophy: Secondary | ICD-10-CM

## 2024-02-23 DIAGNOSIS — E782 Mixed hyperlipidemia: Secondary | ICD-10-CM

## 2024-02-23 DIAGNOSIS — E119 Type 2 diabetes mellitus without complications: Secondary | ICD-10-CM

## 2024-02-23 MED ORDER — DEXCOM G7 SENSOR MISC: 3 refills | Status: DC

## 2024-02-23 NOTE — Progress Notes (Signed)
 LILLETTE Kristeen JINNY Gladis, CMA,acting as a Neurosurgeon for Gaines Ada, FNP.,have documented all relevant documentation on the behalf of Gaines Ada, FNP,as directed by  Gaines Ada, FNP while in the presence of Gaines Ada, FNP.  Subjective:  Patient ID: Emily Phelps , female    DOB: 02/19/1981 , 43 y.o.   MRN: [id number]  Chief Complaint  Patient presents with   Hypertension    Patient presents today for a bp and dm follow up, Patient reports compliance with medication. Patient denies any chest pain, SOB, or headaches. Patient has no concerns today.     HPI Discussed the use of AI scribe software for clinical note transcription with the patient, who gave verbal consent to proceed.  History of Present Illness Emily Phelps is a 43 year old female with hypertension and diabetes who presents for a blood pressure follow-up and diabetes management.  She is managing her hypertension and diabetes with current blood sugar levels ranging between 120 and 130 mg/dL. She engages in physical activity using a work app that includes leg weights and strengthening exercises, and she walks in her apartment during breaks. She also walks in her apartment during breaks.  She mentions a recent change in her eye care regimen, including antibiotics and vitamins prescribed by her eye doctor due to her eyes running badly. She is using two types of eye drops and a cleanser.  She is undergoing a weight loss program and mentions a requirement to reach a weight of 385 pounds. She has been taking Mounjaro  15 mg weekly for her diabetes. She reports a recent vacation to [place]  where she consumed alcohol, which affected her drug screening results. She is in contact with the weight loss clinic and is working on meeting their requirements.  Her toenails, particularly the big toes, are splitting down the middle. She has been using gel and acrylic on her nails. She takes a multivitamin and vitamin D  once a week. Her toenails,  particularly the big toes, are splitting down the middle and turning a yellowish color similar to her fingernails.  In her social history, she works in [employer] and describes the stress of her job, including being 'cussed out ten times in a row' which affects her mood by customers.  She is dealing with the recent death of her father and is attending therapy, which has been reduced to every two weeks and has improved.    Diabetes She presents for her follow-up diabetic visit. She has type 2 diabetes mellitus. There are no hypoglycemic associated symptoms. Pertinent negatives for hypoglycemia include no headaches. There are no diabetic associated symptoms. Pertinent negatives for diabetes include no chest pain. There are no hypoglycemic complications. There are no diabetic complications. Risk factors for coronary artery disease include obesity and sedentary lifestyle. Current diabetic treatment includes oral agent (dual therapy). She is compliant with treatment all of the time. Diabetic current diet: she has been doing a keto diet. When asked about meal planning, she reported none. She has not had a previous visit with a dietitian. (Blood sugars are ranging 120-130) An ACE inhibitor/angiotensin II receptor blocker is being taken. She does not see a podiatrist.Eye exam is not current.  Hypertension This is a chronic problem. The current episode started more than 1 year ago. The problem is unchanged. The problem is controlled. Pertinent negatives include no chest pain, headaches or palpitations. Risk factors for coronary artery disease include obesity and diabetes mellitus. There is no history of chronic renal disease.  Past Medical History:  Diagnosis Date   Abscess of skin of abdomen 05/15/2022   She has a healing wound to her abdomen, treated with antibiotic. Advised to continue antibiotics until completely gone.   Allergy     Depression    Diabetes mellitus (HCC)    Family history of  adverse reaction to anesthesia    mother had n/v after    Family history of breast cancer    Family history of hypertrophic cardiomyopathy 05/13/2021   Family history of kidney cancer    Family history of pancreatic cancer    Family history of prostate cancer    GERD (gastroesophageal reflux disease)    Hyperlipidemia    Hypertension    Legionella pneumonia (HCC) 01/03/2020   Migraine    Obesity    Palpitations 05/13/2021   Panic attacks    Pneumonia    Rectal bleeding    Resistant hypertension 11/28/2014   Sleep apnea    Wears contact lenses      Family History  Problem Relation Age of Onset   Hypertension Mother    Colon polyps Mother    Asthma Mother    Diabetes Father    Hypertension Father    Prostate cancer Father 74   Pancreatic cancer Father 7   Arthritis Father    Vision loss Father    Thyroid  disease Maternal Aunt    Heart disease Maternal Uncle    Kidney failure Maternal Uncle    Cancer Paternal Aunt        pat 1/2 aunt with cancer NOS   Pancreatic cancer Paternal Uncle    Hypertension Maternal Grandmother    Heart disease Maternal Grandmother    Hypertrophic cardiomyopathy Maternal Grandmother    Breast cancer Cousin        dx < 50   Breast cancer Cousin        dx < 50   Hypertension Brother    Colon cancer Neg Hx    Esophageal cancer Neg Hx    Liver cancer Neg Hx    Stomach cancer Neg Hx    Rectal cancer Neg Hx      Current Outpatient Medications:    albuterol  (VENTOLIN  HFA) 108 (90 Base) MCG/ACT inhaler, Inhale 2 puffs into the lungs every 6 (six) hours as needed for wheezing or shortness of breath., Disp: 6.7 g, Rfl: 0   amLODipine  (NORVASC ) 10 MG tablet, TAKE 1 TABLET BY MOUTH ONCE DAILY, Disp: 90 tablet, Rfl: 3   atenolol  (TENORMIN ) 100 MG tablet, TAKE 1 TABLET BY MOUTH EVERY DAY, Disp: 90 tablet, Rfl: 3   atorvastatin  (LIPITOR) 10 MG tablet, TAKE 1 TABLET BY MOUTH EVERY DAY, Disp: 30 tablet, Rfl: 2   azelastine  (ASTELIN ) 0.1 % nasal  spray, Place 2 sprays into both nostrils 2 (two) times daily. Use in each nostril as directed, Disp: 30 mL, Rfl: 12   buPROPion  (WELLBUTRIN  XL) 150 MG 24 hr tablet, TAKE 1 TABLET BY MOUTH EVERY MORNING, Disp: 30 tablet, Rfl: 2   Continuous Blood Gluc Receiver (DEXCOM G6 RECEIVER) DEVI, Use to check blood sugars dx code e11.65, Disp: 3 each, Rfl: 3   Continuous Glucose Sensor (DEXCOM G7 SENSOR) MISC, Use as directed to monitor blood sugars, Disp: 4 each, Rfl: 3   Continuous Glucose Transmitter (DEXCOM G6 TRANSMITTER) MISC, USE TO CHECK BLOOD SUGAR. CHANGE EVERY 90 DAYS, Disp: 1 each, Rfl: 3   cyclobenzaprine  (FLEXERIL ) 10 MG tablet, Take 1 tablet (10 mg total) by mouth 3 (three)  times daily as needed for muscle spasms., Disp: 30 tablet, Rfl: 0   dicyclomine  (BENTYL ) 10 MG capsule, TAKE 1 CAPSULE BY MOUTH 4 TIMES DAILY AS NEEDED FOR SPASMS, Disp: 30 capsule, Rfl: 2   doxycycline  (VIBRAMYCIN ) 50 MG capsule, Take 50 mg by mouth 2 (two) times daily., Disp: , Rfl:    EYSUVIS 0.25 % SUSP, , Disp: , Rfl:    FARXIGA  5 MG TABS tablet, TAKE 1 TABLET BY MOUTH EVERY DAY, Disp: 90 tablet, Rfl: 2   ferrous sulfate 325 (65 FE) MG tablet, Take 325 mg by mouth daily with breakfast., Disp: , Rfl:    fexofenadine  (ALLERGY  RELIEF) 180 MG tablet, Take 1 tablet (180 mg total) by mouth daily., Disp: 90 tablet, Rfl: 3   fluticasone  (FLONASE ) 50 MCG/ACT nasal spray, Place 2 sprays into both nostrils daily., Disp: 16 g, Rfl: 11   Ketoprofen (FROTEK) 10 % CREA, Apply 1 application  topically 4 (four) times daily., Disp: , Rfl:    Lancets (UNILET COMFORTOUCH LANCET) MISC, check blood sugar 3 TIMES DAILY AS DIRECTED, Disp: 200 each, Rfl: 1   Magnesium  Glycinate 100 MG CAPS, Take 1 capsule by mouth every evening., Disp: 30 capsule, Rfl: 3   Multiple Vitamin (MULTIVITAMIN WITH MINERALS) TABS tablet, Take 1 tablet by mouth daily., Disp: , Rfl:    mupirocin  ointment (BACTROBAN ) 2 %, Apply 1 Application topically 2 (two) times  daily., Disp: 22 g, Rfl: 0   omeprazole  (PRILOSEC) 40 MG capsule, Take 1 capsule (40 mg total) by mouth 2 (two) times daily., Disp: 180 capsule, Rfl: 3   ondansetron  (ZOFRAN ) 4 MG tablet, Take 1 tablet (4 mg total) by mouth every 4 (four) hours as needed for nausea or vomiting., Disp: 6 tablet, Rfl: 0   progesterone  (PROMETRIUM ) 100 MG capsule, Take 200 mg by mouth at bedtime., Disp: , Rfl:    progesterone  (PROMETRIUM ) 100 MG capsule, Take 2 capsules by mouth daily., Disp: , Rfl:    spironolactone  (ALDACTONE ) 50 MG tablet, TAKE 1 TABLET BY MOUTH EVERY DAY, Disp: 90 tablet, Rfl: 3   tamsulosin (FLOMAX) 0.4 MG CAPS capsule, Take 0.4 mg by mouth daily., Disp: , Rfl:    tirzepatide  (MOUNJARO ) 15 MG/0.5ML Pen, inject 15 MG into THE SKIN ONCE WEEKLY, Disp: 6 mL, Rfl: 3   topiramate  (TOPAMAX ) 50 MG tablet, TAKE 1 TABLET BY MOUTH 2 TIMES DAILY, Disp: 180 tablet, Rfl: 1   traMADol  (ULTRAM ) 50 MG tablet, Take 1-2 tablets (50-100 mg total) by mouth every 6 (six) hours as needed for moderate pain., Disp: 15 tablet, Rfl: 0   UBRELVY  50 MG TABS, TAKE 1 TABLET BY MOUTH DAILY, Disp: 16 tablet, Rfl: 2   valsartan  (DIOVAN ) 320 MG tablet, TAKE 1 TABLET BY MOUTH EVERY DAY, Disp: 90 tablet, Rfl: 1   Vitamin D , Ergocalciferol , (DRISDOL ) 1.25 MG (50000 UNIT) CAPS capsule, TAKE 1 CAPSULE BY MOUTH EVERY 7 DAYS, Disp: 12 capsule, Rfl: 1   Allergies  Allergen Reactions   Latex Rash   Silicone Rash    Other reaction(s): Unknown   Tape Rash    Other reaction(s): Unknown Other reaction(s): Unknown     Review of Systems  Constitutional: Negative.   HENT: Negative.    Eyes: Negative.   Respiratory: Negative.    Cardiovascular: Negative.  Negative for chest pain, palpitations and leg swelling.  Gastrointestinal: Negative.  Negative for abdominal pain.  Neurological: Negative.  Negative for headaches.  Psychiatric/Behavioral: Negative.       Today's Vitals  02/23/24 1132  BP: 124/70  Pulse: 84  Temp: 98.6 F  (37 C)  TempSrc: Oral  Weight: (!) 418 lb 3.2 oz (189.7 kg)  Height: 5' 4 (1.626 m)  PainSc: 0-No pain   Body mass index is 71.78 kg/m.  Wt Readings from Last 3 Encounters:  02/23/24 (!) 418 lb 3.2 oz (189.7 kg)  01/05/24 (!) 417 lb (189.1 kg)  12/01/23 (!) 417 lb 12.8 oz (189.5 kg)     Objective:  Physical Exam Vitals and nursing note reviewed.  Constitutional:      General: She is not in acute distress.    Appearance: Normal appearance. She is obese.  Cardiovascular:     Rate and Rhythm: Normal rate and regular rhythm.     Pulses: Normal pulses.     Heart sounds: Normal heart sounds. No murmur heard. Pulmonary:     Effort: Pulmonary effort is normal. No respiratory distress.     Breath sounds: Normal breath sounds. No wheezing.  Musculoskeletal:        General: No tenderness. Normal range of motion.  Skin:    General: Skin is warm and dry.     Capillary Refill: Capillary refill takes less than 2 seconds.     Coloration: Skin is not jaundiced.  Neurological:     General: No focal deficit present.     Mental Status: She is alert and oriented to person, place, and time.     Cranial Nerves: No cranial nerve deficit.     Motor: No weakness.  Psychiatric:        Mood and Affect: Mood normal.        Behavior: Behavior normal.        Thought Content: Thought content normal.        Judgment: Judgment normal.      Diabetic foot exam was performed with the following findings:   No deformities, ulcerations, or other skin breakdown Normal sensation of 10g monofilament Intact posterior tibialis and dorsalis pedis pulses        Assessment And Plan:  Essential hypertension Assessment & Plan: B/P is well controlled, advised to focus on lifestyle modification   Orders: -     CMP14+EGFR  Controlled type 2 diabetes mellitus without complication, with long-term current use of insulin  (HCC) Assessment & Plan: Blood sugars range between 120-130 mg/dL. She uses Dexcom G6  and plans to upgrade to G7. No current exercise routine due to knee issues, but engages in physical therapy exercises and walking during breaks. - Upgrade Dexcom to G7. - Encourage participation in water aerobics at [employer] center or [employer].  Orders: -     Hemoglobin A1c -     Dexcom G7 Sensor; Use as directed to monitor blood sugars  Dispense: 4 each; Refill: 3 -     CMP14+EGFR -     Microalbumin / creatinine urine ratio  OSA on CPAP Assessment & Plan: She is doing well on her CPAP.  She reports having improved quality of life when using.   Vitamin B12 deficiency  Mixed hyperlipidemia Assessment & Plan: Cholesterol levels are stable.  Continue statin.   Morbid obesity with BMI of 70 and over, adult [facility]) Assessment & Plan: She is in a weight loss program, working towards a preoperative weight goal of 385 lbs. Currently on Tirzepatide  (Mounjaro ) 15 mg weekly. Discussed challenges with dietary habits and recent vacation impacting weight management. She is in contact with the weight clinic for further management and preoperative clearance. -  Continue Tirzepatide  (Mounjaro ) 15 mg weekly. - Follow up with weight clinic for further management and preoperative clearance.   Splitting of nail Assessment & Plan: Toenails splitting, possibly due to gel nail polish or fungal infection. No visible discoloration, but toenails turning color similar to a finger with possible fungal infection. - Apply antifungal spray to toenails. - Avoid gel and acrylic nail treatments, consider using a nail hardener.   Vitamin D  deficiency Assessment & Plan: She is taking vitamin D  once a week. Toenail splitting may be related to vitamin deficiency or fungal infection. - Check vitamin D  levels. - Continue vitamin D  supplementation  Orders: -     VITAMIN D  25 Hydroxy (Vit-D Deficiency, Fractures)    Return for controlled DM check 4 months.  Patient was given opportunity to ask questions. Patient  verbalized understanding of the plan and was able to repeat key elements of the plan. All questions were answered to their satisfaction.    LILLETTE Gaines Ada, FNP, have reviewed all documentation for this visit. The documentation on 02/23/24 for the exam, diagnosis, procedures, and orders are all accurate and complete.   IF YOU HAVE BEEN REFERRED TO A SPECIALIST, IT MAY TAKE 1-2 WEEKS TO SCHEDULE/PROCESS THE REFERRAL. IF YOU HAVE NOT HEARD FROM US /SPECIALIST IN TWO WEEKS, PLEASE GIVE US  A CALL AT [phone] X [phone].

## 2024-02-24 LAB — CMP14+EGFR
ALT: 19 IU/L (ref 0–32)
AST: 10 IU/L (ref 0–40)
Albumin: 4.4 g/dL (ref 3.9–4.9)
Alkaline Phosphatase: 108 IU/L (ref 44–121)
BUN/Creatinine Ratio: 12 (ref 9–23)
BUN: 11 mg/dL (ref 6–24)
Bilirubin Total: 0.5 mg/dL (ref 0.0–1.2)
CO2: 19 mmol/L — ABNORMAL LOW (ref 20–29)
Calcium: 9.8 mg/dL (ref 8.7–10.2)
Chloride: 105 mmol/L (ref 96–106)
Creatinine, Ser: 0.91 mg/dL (ref 0.57–1.00)
Globulin, Total: 2.9 g/dL (ref 1.5–4.5)
Glucose: 96 mg/dL (ref 70–99)
Potassium: 4.3 mmol/L (ref 3.5–5.2)
Sodium: 142 mmol/L (ref 134–144)
Total Protein: 7.3 g/dL (ref 6.0–8.5)
eGFR: 82 mL/min/1.73 (ref >59)

## 2024-02-24 LAB — HEMOGLOBIN A1C
Est. average glucose Bld gHb Est-mCnc: 117 mg/dL
Hgb A1c MFr Bld: 5.7 % — ABNORMAL HIGH (ref 4.8–5.6)

## 2024-02-24 LAB — MICROALBUMIN / CREATININE URINE RATIO
Creatinine, Urine: 54.3 mg/dL
Microalb/Creat Ratio: 10 mg/g{creat} (ref 0–29)
Microalbumin, Urine: 5.4 ug/mL

## 2024-02-24 LAB — VITAMIN D 25 HYDROXY (VIT D DEFICIENCY, FRACTURES): Vit D, 25-Hydroxy: 54.6 ng/mL (ref 30.0–100.0)

## 2024-03-10 ENCOUNTER — Ambulatory Visit: Payer: Self-pay | Admitting: Nurse Practitioner

## 2024-03-10 DIAGNOSIS — E119 Type 2 diabetes mellitus without complications: Secondary | ICD-10-CM | POA: Insufficient documentation

## 2024-03-10 DIAGNOSIS — E559 Vitamin D deficiency, unspecified: Secondary | ICD-10-CM | POA: Insufficient documentation

## 2024-03-10 DIAGNOSIS — L603 Nail dystrophy: Secondary | ICD-10-CM | POA: Insufficient documentation

## 2024-03-10 NOTE — Assessment & Plan Note (Signed)
 Blood sugars range between 120-130 mg/dL. She uses Dexcom G6 and plans to upgrade to G7. No current exercise routine due to knee issues, but engages in physical therapy exercises and walking during breaks. - Upgrade Dexcom to G7. - Encourage participation in water aerobics at [employer] center or [employer].

## 2024-03-10 NOTE — Assessment & Plan Note (Signed)
 Toenails splitting, possibly due to gel nail polish or fungal infection. No visible discoloration, but toenails turning color similar to a finger with possible fungal infection. - Apply antifungal spray to toenails. - Avoid gel and acrylic nail treatments, consider using a nail hardener.

## 2024-03-10 NOTE — Assessment & Plan Note (Signed)
 B/P is well controlled, advised to focus on lifestyle modification

## 2024-03-10 NOTE — Assessment & Plan Note (Signed)
 She is doing well on her CPAP.  She reports having improved quality of life when using.

## 2024-03-10 NOTE — Assessment & Plan Note (Signed)
 She is in a weight loss program, working towards a preoperative weight goal of 385 lbs. Currently on Tirzepatide  (Mounjaro ) 15 mg weekly. Discussed challenges with dietary habits and recent vacation impacting weight management. She is in contact with the weight clinic for further management and preoperative clearance. - Continue Tirzepatide  (Mounjaro ) 15 mg weekly. - Follow up with weight clinic for further management and preoperative clearance.

## 2024-03-10 NOTE — Assessment & Plan Note (Signed)
 She is taking vitamin D  once a week. Toenail splitting may be related to vitamin deficiency or fungal infection. - Check vitamin D  levels. - Continue vitamin D  supplementation

## 2024-03-10 NOTE — Assessment & Plan Note (Signed)
 Cholesterol levels are stable. Continue statin

## 2024-03-16 ENCOUNTER — Telehealth: Payer: Self-pay

## 2024-03-16 NOTE — Telephone Encounter (Signed)
-----   Message from Alan JONELLE Coombs sent at 03/16/2024 10:36 AM EDT ----- Regarding: RE: Labs No need to follow up at this time, patient just needs to get LFTs/CBC checked every 6 months for fatty liver. Hepatocellular work up negative. Alan ----- Message ----- From: Mercer Cristino SAILOR, RN Sent: 03/16/2024   9:29 AM EDT To: Alan JONELLE Coombs, PA-C Subject: FW: Labs                                       Hey, had this reminder in epic for repeat LFTs. CMET was drawn on 8/12 and LFTs were normal (results in epic).  Please advise, thanks. ----- Message ----- From: Mercer Cristino SAILOR, RN Sent: 03/16/2024  12:00 AM EDT To: Cristino SAILOR Mercer, RN Subject: Labs                                           Hepatic function panel - need to order - Coombs

## 2024-03-16 NOTE — Telephone Encounter (Signed)
 Lab reminder in epic for repeat labs in February 2026

## 2024-03-21 ENCOUNTER — Ambulatory Visit

## 2024-03-22 ENCOUNTER — Ambulatory Visit (INDEPENDENT_AMBULATORY_CARE_PROVIDER_SITE_OTHER)

## 2024-03-22 DIAGNOSIS — Z23 Encounter for immunization: Secondary | ICD-10-CM

## 2024-03-22 DIAGNOSIS — K760 Fatty (change of) liver, not elsewhere classified: Secondary | ICD-10-CM

## 2024-03-22 DIAGNOSIS — R160 Hepatomegaly, not elsewhere classified: Secondary | ICD-10-CM

## 2024-03-28 ENCOUNTER — Other Ambulatory Visit: Payer: Self-pay | Admitting: Nurse Practitioner

## 2024-04-05 ENCOUNTER — Ambulatory Visit: Admitting: Neurology

## 2024-04-05 ENCOUNTER — Encounter: Payer: Self-pay | Admitting: Neurology

## 2024-04-05 VITALS — BP 159/94 | HR 79 | Ht 64.0 in | Wt >= 6400 oz

## 2024-04-05 DIAGNOSIS — G43909 Migraine, unspecified, not intractable, without status migrainosus: Secondary | ICD-10-CM | POA: Diagnosis not present

## 2024-04-05 DIAGNOSIS — G43709 Chronic migraine without aura, not intractable, without status migrainosus: Secondary | ICD-10-CM

## 2024-04-05 DIAGNOSIS — G4733 Obstructive sleep apnea (adult) (pediatric): Secondary | ICD-10-CM | POA: Diagnosis not present

## 2024-04-05 DIAGNOSIS — R030 Elevated blood-pressure reading, without diagnosis of hypertension: Secondary | ICD-10-CM | POA: Diagnosis not present

## 2024-04-05 DIAGNOSIS — G4489 Other headache syndrome: Secondary | ICD-10-CM

## 2024-04-05 DIAGNOSIS — R519 Headache, unspecified: Secondary | ICD-10-CM

## 2024-04-05 MED ORDER — UBRELVY 100 MG PO TABS: 100.0000 mg | ORAL_TABLET | ORAL | 3 refills | Status: AC | PRN

## 2024-04-05 MED ORDER — TOPIRAMATE 50 MG PO TABS: 100.0000 mg | ORAL_TABLET | Freq: Two times a day (BID) | ORAL | 5 refills | Status: AC

## 2024-04-05 NOTE — Patient Instructions (Addendum)
 It was nice to meet you today.   As discussed, your headaches are likely due to a combination of factors. These factors include: stress, migraines, not sleeping well and sleep apnea.    Here is what we discussed today and my recommendations for you:   Please remember, common headache triggers are: sleep deprivation, dehydration, overheating, stress, hypoglycemia or skipping meals and blood sugar fluctuations, excessive pain medications or excessive alcohol use or caffeine withdrawal. Some people have food triggers such as aged cheese, orange juice or chocolate, especially dark chocolate, or MSG (monosodium glutamate). Try to avoid these headache triggers as much possible. It may be helpful to keep a headache diary to figure out what makes your headaches worse or brings them on and what alleviates them. Some people report headache onset after exercise but studies have shown that regular exercise may actually prevent headaches from coming. If you have exercise-induced headaches, please make sure that you drink plenty of fluid before and after exercising and that you do not over do it and do not overheat. Limit your caffeine to 1 serving/day or less, as caffeine can drive headaches.  We will do a brain scan, called MRI and call you with the test results. We will have to schedule you for this on a separate date. This test requires authorization from your insurance, and we will take care of the insurance process. Please follow up with your sleep provider for your sleep apnea and try to get back on your autoPAP machine. Please remember that treating even mild sleep apnea may help you feel better, rest better, reduce your morning headaches and help you lose weight. The long-term risks and ramifications of untreated moderate to severe obstructive sleep apnea may include (but are not limited to): increased risk for cardiovascular disease, including congestive heart failure, stroke, difficult to control hypertension,  treatment resistant obesity, arrhythmias, especially irregular heartbeat commonly known as A. Fib. (atrial fibrillation); even type 2 diabetes has been linked to untreated OSA.  For headache prevention, you can continue with Topamax  50 mg, we will increase it to 100 mg twice daily.  Continue with Ubrelvy , which is not for daily use and strictly for as needed use. I will increase it to the 100 mg strength: Take 1 pill at onset of migraine headache. May cause sedation and nausea.  We will do a brain scan, called MRI and call you with the test results. We will have to schedule you for this on a separate date. This test requires authorization from your insurance, and we will take care of the insurance process. I recommend you see orthopedics for your right knee pain.
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
## 2024-04-06 ENCOUNTER — Telehealth: Payer: Self-pay | Admitting: Neurology

## 2024-04-06 NOTE — Telephone Encounter (Signed)
 sent to GI they obtain Drucie Opitz [phone]

## 2024-04-12 ENCOUNTER — Other Ambulatory Visit: Payer: Self-pay | Admitting: Nurse Practitioner

## 2024-04-12 DIAGNOSIS — G4709 Other insomnia: Secondary | ICD-10-CM

## 2024-04-18 NOTE — Progress Notes (Unsigned)
 04/19/2024 Emily Phelps [id number] 04-02-1981  Referring provider: Georgina Speaks, FNP Primary GI doctor: Dr. Leigh (Dr. Eda)  ASSESSMENT AND PLAN:   Fatty Liver Disease/hepatomegaly/nodular liver low K PA since 2023 mild splenomegaly seen on elastography    Latest Ref Rng & Units 02/23/2024   12:18 PM 09/08/2023   12:04 PM 05/19/2023   12:00 AM  Hepatic Function  Total Protein 6.0 - 8.5 g/dL 7.3  8.3    Albumin 3.9 - 4.9 g/dL 4.4  4.6  4.1      AST 0 - 40 IU/L 10  14  17       ALT 0 - 32 IU/L 19  19  20       Alk Phosphatase 44 - 121 IU/L 108  93  93      Total Bilirubin 0.0 - 1.2 mg/dL 0.5  0.5    Bilirubin, Direct 0.0 - 0.3 mg/dL  0.0       This result is from an external source.   Platelets 443.0  INR 09/08/2023 1.1  Positive Peth test in 06/2023, 02/2024 states has not been drinking regularly, had shots with nephew this past weeks Hepatocellular workup negative 10/2023 hepatitis B immunity needs hepatitis A  10/13/2023 elastography K PA low at 2.3 nodular liver ill-defined left hepatic lobe 4.5 cm suggest MRI splenomegaly no ascites While patient has low kPA with hepatomegaly and splenomegaly, fatty liver and ETOH use some concern for severe fatty liver versus cirrhosis -Will get MRI AB to evaluate lesion - Stop ETOH - suggest HCC screening every 6 months with Cbc, LFT, INR - low sodium diet - continue to pursue weight loss and mounjaro   Constipation Worsening constipation with Mounjaro .  -miralax  1/2 daily, consider linzess -prefers lifestyle for now, no alarm symptoms, monitor closely -Consider pelvic floor physical therapy for urinary frequency and back pain, declines  Iron Deficiency Anemia 09/08/2023  HGB 13.4 MCV 76.9 Platelets 443.0 09/08/2023 Iron 79 Ferritin 36.1 B12 718 Recent Labs    05/19/23 0000 09/08/23 1204  HGB 11.2* 13.4  Colonoscopy/endoscopy 2020 unremarkable likely menstruation, has been on her menses since 28th, she has follow up with  GYN this week. She is on oral iron -Continue iron supplementation. -Check iron levels and ferritin. - will send hemoccult cards - consider endoscopic evaluation at the hospital pending labs  Gastroesophageal Reflux Disease (GERD) Well-controlled with occasional flare-ups when medication is forgotten.   No dysphagia, nausea vomiting. -Continue current medication regimen and ensure consistent use. -Likely gastroparesis component, given diet information  Morbid obesity  Body mass index is 69.86 kg/m.  -Patient has been advised to make an attempt to improve diet and exercise patterns to aid in weight loss. -Recommended diet heavy in fruits and veggies and low in animal meats, cheeses, and dairy products, appropriate calorie intake   History of Present Illness:  43 y.o. female  with a past medical history of diabetes, hypertension, hyperlipidemia, obesity GERD, gastritis, constipation/IBS, primary parathyroidism status post right inferior parathyroidectomy 11/06/2021 with Dr. Eletha and others listed below, returns to clinic today for evaluation of GERD.  07/11/2019 EGD and colonoscopy for IDA EGD normal, colonoscopy unremarkable, no source for rectal bleeding identified, esophageal biopsies showed reflux negative EOE negative H. pylori or metaplasia. 07/18/2020 CT abdomen pelvis with contrast showed nephrolithiasis, stable hepatomegaly, stable umbilical and inguinal hernias no acute abnormalities.  Discussed the use of AI scribe software for clinical note transcription with the patient, who gave verbal consent to proceed.  History of  Present Illness   Emily Phelps is a 43 year old female with severe fatty liver disease who presents for follow-up of liver function and alcohol use.  She is following up on her liver condition, with recent elastography showing low stiffness but revealing an enlarged, nodular liver and a mildly enlarged spleen. A previous workup indicated no fibrosis, but an  ill-defined liver lesion was noted. She has a history of alcohol use, with a positive PETH test in August, and admits to recent alcohol consumption during a vacation and a family celebration.  She experiences ongoing issues with constipation, which fluctuates. Abdominal discomfort is noted, especially during her irregular menstrual cycle. She has been on progesterone  for a couple of years to manage these irregularities. She is currently taking Mounjaro , which she believes contributes to her constipation. She has used Miralax  intermittently, noting that a full dose was too much, causing excessive bowel movements. Abdominal cramping occurs when she needs to have a bowel movement.  She is also dealing with reflux, which she manages by eating slowly and chewing food thoroughly. She avoids sodas and prefers sparkling water to help with belching. No trouble swallowing and denies any dark black stool or blood in the stool. She is still taking iron supplements.      She  reports that she has never smoked. She has never used smokeless tobacco. She reports that she does not currently use alcohol after a past usage of about 4.0 standard drinks of alcohol per week. She reports that she does not currently use drugs after having used the following drugs: Marijuana. Her family history includes Arthritis in her father; Asthma in her mother; Breast cancer in her cousin and cousin; Cancer in her paternal aunt; Colon polyps in her mother; Diabetes in her father; Heart disease in her maternal grandmother and maternal uncle; Hypertension in her brother, father, maternal grandmother, and mother; Hypertrophic cardiomyopathy in her maternal grandmother; Kidney failure in her maternal uncle; Pancreatic cancer in her paternal uncle; Pancreatic cancer (age of onset: 5) in her father; Prostate cancer (age of onset: 41) in her father; Sleep apnea in her brother, maternal grandmother, maternal uncle, and another family member; Thyroid   disease in her maternal aunt; Vision loss in her father.   Current Medications:   Current Outpatient Medications (Endocrine & Metabolic):    FARXIGA  5 MG TABS tablet, TAKE 1 TABLET BY MOUTH EVERY DAY   progesterone  (PROMETRIUM ) 100 MG capsule, Take 200 mg by mouth at bedtime.   progesterone  (PROMETRIUM ) 100 MG capsule, Take 2 capsules by mouth daily.   tirzepatide  (MOUNJARO ) 15 MG/0.5ML Pen, inject 15 MG into THE SKIN ONCE WEEKLY  Current Outpatient Medications (Cardiovascular):    amLODipine  (NORVASC ) 10 MG tablet, TAKE 1 TABLET BY MOUTH ONCE DAILY   atenolol  (TENORMIN ) 100 MG tablet, TAKE 1 TABLET BY MOUTH EVERY DAY   atorvastatin  (LIPITOR) 10 MG tablet, TAKE 1 TABLET BY MOUTH EVERY DAY   spironolactone  (ALDACTONE ) 50 MG tablet, TAKE 1 TABLET BY MOUTH EVERY DAY   valsartan  (DIOVAN ) 320 MG tablet, TAKE 1 TABLET BY MOUTH EVERY DAY  Current Outpatient Medications (Respiratory):    albuterol  (VENTOLIN  HFA) 108 (90 Base) MCG/ACT inhaler, Inhale 2 puffs into the lungs every 6 (six) hours as needed for wheezing or shortness of breath.   azelastine  (ASTELIN ) 0.1 % nasal spray, Place 2 sprays into both nostrils 2 (two) times daily. Use in each nostril as directed   fexofenadine  (ALLERGY  RELIEF) 180 MG tablet, Take 1 tablet (180 mg  total) by mouth daily.   fluticasone  (FLONASE ) 50 MCG/ACT nasal spray, Place 2 sprays into both nostrils daily.  Current Outpatient Medications (Analgesics):    traMADol  (ULTRAM ) 50 MG tablet, Take 1-2 tablets (50-100 mg total) by mouth every 6 (six) hours as needed for moderate pain.   Ubrogepant  (UBRELVY ) 100 MG TABS, Take 1 tablet (100 mg total) by mouth as needed. Not for daily use.  Current Outpatient Medications (Hematological):    ferrous sulfate 325 (65 FE) MG tablet, Take 325 mg by mouth daily with breakfast.  Current Outpatient Medications (Other):    buPROPion  (WELLBUTRIN  XL) 150 MG 24 hr tablet, TAKE 1 TABLET BY MOUTH EVERY MORNING   Continuous Blood  Gluc Receiver (DEXCOM G6 RECEIVER) DEVI, Use to check blood sugars dx code e11.65   Continuous Glucose Transmitter (DEXCOM G6 TRANSMITTER) MISC, USE TO CHECK BLOOD SUGAR. CHANGE EVERY 90 DAYS   dicyclomine  (BENTYL ) 10 MG capsule, TAKE 1 CAPSULE BY MOUTH 4 TIMES DAILY AS NEEDED FOR SPASMS   doxycycline  (VIBRAMYCIN ) 50 MG capsule, Take 50 mg by mouth 2 (two) times daily.   EYSUVIS 0.25 % SUSP,    Ketoprofen (FROTEK) 10 % CREA, Apply 1 application  topically 4 (four) times daily.   Lancets (UNILET COMFORTOUCH LANCET) MISC, check blood sugar 3 TIMES DAILY AS DIRECTED   Magnesium  Glycinate 100 MG CAPS, TAKE 1 CAPSULE BY MOUTH EVERY EVENING   Multiple Vitamin (MULTIVITAMIN WITH MINERALS) TABS tablet, Take 1 tablet by mouth daily.   mupirocin  ointment (BACTROBAN ) 2 %, Apply 1 Application topically 2 (two) times daily.   omeprazole  (PRILOSEC) 40 MG capsule, Take 1 capsule (40 mg total) by mouth 2 (two) times daily.   ondansetron  (ZOFRAN ) 4 MG tablet, Take 1 tablet (4 mg total) by mouth every 4 (four) hours as needed for nausea or vomiting.   tamsulosin (FLOMAX) 0.4 MG CAPS capsule, Take 0.4 mg by mouth daily.   topiramate  (TOPAMAX ) 50 MG tablet, Take 2 tablets (100 mg total) by mouth 2 (two) times daily.   Vitamin D , Ergocalciferol , (DRISDOL ) 1.25 MG (50000 UNIT) CAPS capsule, TAKE 1 CAPSULE BY MOUTH EVERY 7 DAYS   Continuous Glucose Sensor (DEXCOM G7 SENSOR) MISC, Use as directed to monitor blood sugars (Patient not taking: Reported on 04/19/2024)   cyclobenzaprine  (FLEXERIL ) 10 MG tablet, Take 1 tablet (10 mg total) by mouth 3 (three) times daily as needed for muscle spasms. (Patient not taking: Reported on 04/19/2024)  Surgical History:  She  has a past surgical history that includes Fracture surgery; right hand pin (07/15/2003); Colonoscopy with propofol  (N/A, 07/11/2019); Esophagogastroduodenoscopy (egd) with propofol  (N/A, 07/11/2019); biopsy (07/11/2019); Colonoscopy; Cervix biopsy; and  Parathyroidectomy (Right, 11/06/2021).  Current Medications, Allergies, Past Medical History, Past Surgical History, Family History and Social History were reviewed in [employer] record.  Physical Exam: BP 122/80   Pulse 94   Ht 5' 4 (1.626 m)   Wt (!) 407 lb (184.6 kg)   BMI 69.86 kg/m  General:   Pleasant, obese female in no acute distress Heart : Regular rate and rhythm; no murmurs Pulm: Clear anteriorly; no wheezing Abdomen:  Soft, Obese AB, Active bowel sounds. Non tender Rectal: Not evaluated Extremities:  without  edema. Neurologic:  Alert and  oriented x4;  No focal deficits.  Psych:  Cooperative. Normal mood and affect.    Alan JONELLE Coombs, PA-C 04/19/24

## 2024-04-19 ENCOUNTER — Encounter: Payer: Self-pay | Admitting: Physician Assistant

## 2024-04-19 ENCOUNTER — Other Ambulatory Visit

## 2024-04-19 ENCOUNTER — Encounter: Payer: Self-pay | Admitting: Neurology

## 2024-04-19 ENCOUNTER — Ambulatory Visit: Admitting: Physician Assistant

## 2024-04-19 VITALS — BP 122/80 | HR 94 | Ht 64.0 in | Wt >= 6400 oz

## 2024-04-19 DIAGNOSIS — D509 Iron deficiency anemia, unspecified: Secondary | ICD-10-CM

## 2024-04-19 DIAGNOSIS — R160 Hepatomegaly, not elsewhere classified: Secondary | ICD-10-CM

## 2024-04-19 DIAGNOSIS — K769 Liver disease, unspecified: Secondary | ICD-10-CM

## 2024-04-19 DIAGNOSIS — R1084 Generalized abdominal pain: Secondary | ICD-10-CM

## 2024-04-19 DIAGNOSIS — Z6841 Body Mass Index (BMI) 40.0 and over, adult: Secondary | ICD-10-CM

## 2024-04-19 DIAGNOSIS — K219 Gastro-esophageal reflux disease without esophagitis: Secondary | ICD-10-CM

## 2024-04-19 DIAGNOSIS — R162 Hepatomegaly with splenomegaly, not elsewhere classified: Secondary | ICD-10-CM

## 2024-04-19 DIAGNOSIS — K760 Fatty (change of) liver, not elsewhere classified: Secondary | ICD-10-CM | POA: Diagnosis not present

## 2024-04-19 DIAGNOSIS — K5900 Constipation, unspecified: Secondary | ICD-10-CM | POA: Diagnosis not present

## 2024-04-19 DIAGNOSIS — K5909 Other constipation: Secondary | ICD-10-CM

## 2024-04-19 LAB — CBC WITH DIFFERENTIAL/PLATELET
Basophils Absolute: 0.1 K/uL (ref 0.0–0.1)
Basophils Relative: 0.6 % (ref 0.0–3.0)
Eosinophils Absolute: 0.2 K/uL (ref 0.0–0.7)
Eosinophils Relative: 1.5 % (ref 0.0–5.0)
HCT: 37.2 % (ref 36.0–46.0)
Hemoglobin: 12.3 g/dL (ref 12.0–15.0)
Lymphocytes Relative: 24.2 % (ref 12.0–46.0)
Lymphs Abs: 2.5 K/uL (ref 0.7–4.0)
MCHC: 33.1 g/dL (ref 30.0–36.0)
MCV: 77.7 fl — ABNORMAL LOW (ref 78.0–100.0)
Monocytes Absolute: 0.7 K/uL (ref 0.1–1.0)
Monocytes Relative: 7.3 % (ref 3.0–12.0)
Neutro Abs: 6.7 K/uL (ref 1.4–7.7)
Neutrophils Relative %: 66.4 % (ref 43.0–77.0)
Platelets: 402 K/uL — ABNORMAL HIGH (ref 150.0–400.0)
RBC: 4.78 Mil/uL (ref 3.87–5.11)
RDW: 18.1 % — ABNORMAL HIGH (ref 11.5–15.5)
WBC: 10.2 K/uL (ref 4.0–10.5)

## 2024-04-19 LAB — HEPATIC FUNCTION PANEL
ALT: 23 U/L (ref 0–35)
AST: 15 U/L (ref 0–37)
Albumin: 4.2 g/dL (ref 3.5–5.2)
Alkaline Phosphatase: 81 U/L (ref 39–117)
Bilirubin, Direct: 0.1 mg/dL (ref 0.0–0.3)
Total Bilirubin: 0.5 mg/dL (ref 0.2–1.2)
Total Protein: 7.3 g/dL (ref 6.0–8.3)

## 2024-04-19 LAB — BASIC METABOLIC PANEL WITH GFR
BUN: 14 mg/dL (ref 6–23)
CO2: 23 meq/L (ref 19–32)
Calcium: 9.5 mg/dL (ref 8.4–10.5)
Chloride: 105 meq/L (ref 96–112)
Creatinine, Ser: 0.69 mg/dL (ref 0.40–1.20)
GFR: 108.26 mL/min (ref >60.00)
Glucose, Bld: 99 mg/dL (ref 70–99)
Potassium: 3.7 meq/L (ref 3.5–5.1)
Sodium: 139 meq/L (ref 135–145)

## 2024-04-19 LAB — IBC + FERRITIN
Ferritin: 59.5 ng/mL (ref 10.0–291.0)
Iron: 74 ug/dL (ref 42–145)
Saturation Ratios: 21.9 % (ref 20.0–50.0)
TIBC: 337.4 ug/dL (ref 250.0–450.0)
Transferrin: 241 mg/dL (ref 212.0–360.0)

## 2024-04-19 LAB — PROTIME-INR
INR: 1.2 ratio — ABNORMAL HIGH (ref 0.8–1.0)
Prothrombin Time: 12.4 s (ref 9.6–13.1)

## 2024-04-19 NOTE — Patient Instructions (Signed)
 Your provider has requested that you go to the basement level for lab work before leaving today. Press B on the elevator. The lab is located at the first door on the left as you exit the elevator.  Understanding Your Weekly GLP-1 Injection  A helpful guide to managing common side effects  You are on a once-weekly injectable medication in the GLP-1 receptor agonist class. These medications can be very effective for blood sugar control, weight loss, and heart protection, fatty liver or OSA, but they can also come with some side effects that are important to understand. The good news is: most side effects can be managed with a few adjustments.  1. Gastroparesis-Like Symptoms These medications slow down your stomach to help you feel full longer -- great for weight loss and blood sugar control, but they can sometimes cause symptoms that feel like gastroparesis (slow stomach emptying). Symptoms may include: -Feeling full quickly when eating -Nausea or vomiting -Bloating or abdominal discomfort -Worsening heartburn or reflux -Acid regurgitation -Stomach spasms or tightness What you can do: ??? Eat small, frequent meals (4-6 per day) ?? Drink fluids between meals, not during ?? Avoid high-fiber foods (like raw veggies or whole grains); cook your veggies well ?? Spread protein throughout the day (try [place] yogurt, eggs, soft meats, Glucerna, milk) ?? Choose soft foods you can mash with a fork ?? Switch to pured foods or liquids during flare-ups ?? Consider reading: Living Well with Gastroparesis by Camelia Bone ?? Downloadable Diet Guide: [facility] Gastroparesis Diet PDF  ?? Tip: Try following a gastroparesis-friendly diet on the day of your injection and the day after, when the medication's effect is strongest.  2. Constipation Since this medication slows down your digestive system, constipation is very common. Tips to help: ?? Drink plenty of water ???? Stay active with  regular exercise ?? Add fiber-rich but gentle foods like kiwi ?? Try a low-dose magnesium  supplement at night ?? Use MiraLAX  (half to one capful daily) if constipation becomes more frequent, especially if your dose increases  If these strategies don't help, talk to your provider -- they may recommend or prescribe other treatments.  3. When to Call the Doctor or Go to the ER While rare, this medication can slightly increase your risk of serious conditions like: Pancreatitis (inflammation of the pancreas) Gallstones or gallbladder problems Watch for these signs and seek help if you experience: Severe abdominal pain (especially in the upper belly or that radiates to your back) Pain in the right upper side of your abdomen Nausea, vomiting, fever, or chills that don't go away ?? Call your provider or go to the ER if these occur.  4. Who Should NOT Take This Medication? This medication should be avoided if you have: A personal history of pancreatitis  A personal or family history of medullary thyroid  cancer A condition called Multiple Endocrine Neoplasia Syndrome Type 2 (MEN2)  Final Note If the side effects are too bothersome, remember: Most symptoms will go away if you stop the medication. But many people tolerate it well after the first few weeks, especially with the right strategies in place.  Miralax  is an osmotic laxative.  It only brings more water into the stool.  This is safe to take daily.  Can take up to 17 gram of miralax  twice a day.  Mix with juice or coffee.  Start 1/2 capful for 3-4 days and reassess your response in 3-4 days.  You can increase and decrease the dose based on  your response.  Remember, it can take up to 3-4 days to take effect OR for the effects to wear off.   I often pair this with benefiber in the morning to help assure the stool is not too loose.   You have been scheduled for an MRI at [facility] . Your appointment time is 5:00 pm. Please  arrive to admitting (at main entrance of the hospital) 30 minutes prior to your appointment time for registration purposes. Please make certain not to have anything to eat or drink 6 hours prior to your test. In addition, if you have any metal in your body, have a pacemaker or defibrillator, please be sure to let your ordering physician know. This test typically takes 45 minutes to 1 hour to complete. Should you need to reschedule, please call [phone] to do so.  Due to recent changes in healthcare laws, you may see the results of your imaging and laboratory studies on MyChart before your provider has had a chance to review them.  We understand that in some cases there may be results that are confusing or concerning to you. Not all laboratory results come back in the same time frame and the provider may be waiting for multiple results in order to interpret others.  Please give us  48 hours in order for your provider to thoroughly review all the results before contacting the office for clarification of your results.    I appreciate the  opportunity to care for you  Thank You   [facility]

## 2024-04-19 NOTE — Progress Notes (Signed)
 Agree with assessment and plan as outlined.

## 2024-04-20 ENCOUNTER — Ambulatory Visit: Payer: Self-pay | Admitting: Physician Assistant

## 2024-04-21 ENCOUNTER — Ambulatory Visit (HOSPITAL_COMMUNITY): Admission: RE | Admit: 2024-04-21

## 2024-04-22 ENCOUNTER — Other Ambulatory Visit: Payer: Self-pay | Admitting: Physician Assistant

## 2024-04-22 ENCOUNTER — Other Ambulatory Visit (HOSPITAL_BASED_OUTPATIENT_CLINIC_OR_DEPARTMENT_OTHER): Payer: Self-pay | Admitting: Cardiovascular Disease

## 2024-04-22 DIAGNOSIS — R1084 Generalized abdominal pain: Secondary | ICD-10-CM

## 2024-04-24 ENCOUNTER — Ambulatory Visit
Admission: RE | Admit: 2024-04-24 | Discharge: 2024-04-24 | Disposition: A | Discharge status: Home / Self Care | Source: Ambulatory Visit | Attending: Neurology | Admitting: Neurology

## 2024-04-24 DIAGNOSIS — R030 Elevated blood-pressure reading, without diagnosis of hypertension: Secondary | ICD-10-CM

## 2024-04-24 DIAGNOSIS — G4489 Other headache syndrome: Secondary | ICD-10-CM

## 2024-04-24 DIAGNOSIS — G43709 Chronic migraine without aura, not intractable, without status migrainosus: Secondary | ICD-10-CM

## 2024-04-24 DIAGNOSIS — G4733 Obstructive sleep apnea (adult) (pediatric): Secondary | ICD-10-CM

## 2024-04-24 DIAGNOSIS — R519 Headache, unspecified: Secondary | ICD-10-CM

## 2024-04-24 DIAGNOSIS — G43909 Migraine, unspecified, not intractable, without status migrainosus: Secondary | ICD-10-CM

## 2024-04-25 ENCOUNTER — Ambulatory Visit (HOSPITAL_COMMUNITY)
Admission: RE | Admit: 2024-04-25 | Discharge: 2024-04-25 | Disposition: A | Discharge status: Home / Self Care | Source: Ambulatory Visit | Attending: Physician Assistant | Admitting: Physician Assistant

## 2024-04-25 ENCOUNTER — Ambulatory Visit (HOSPITAL_COMMUNITY): Admission: RE | Admit: 2024-04-25 | Source: Ambulatory Visit

## 2024-04-25 ENCOUNTER — Encounter (HOSPITAL_COMMUNITY): Payer: Self-pay

## 2024-04-25 DIAGNOSIS — K769 Liver disease, unspecified: Secondary | ICD-10-CM | POA: Insufficient documentation

## 2024-04-25 MED ORDER — GADOBUTROL 1 MMOL/ML IV SOLN
10.0000 mL | Freq: Once | INTRAVENOUS | Status: AC | PRN
Start: 2024-04-25 — End: 2024-04-25
  Administered 2024-04-25: 10 mL via INTRAVENOUS

## 2024-05-02 ENCOUNTER — Other Ambulatory Visit: Payer: Self-pay | Admitting: Nurse Practitioner

## 2024-05-03 ENCOUNTER — Encounter: Payer: Self-pay | Admitting: Internal Medicine

## 2024-05-03 ENCOUNTER — Other Ambulatory Visit: Payer: Self-pay

## 2024-05-03 ENCOUNTER — Ambulatory Visit: Admitting: Internal Medicine

## 2024-05-03 VITALS — BP 136/84 | HR 92 | Temp 98.2°F | Ht 64.0 in | Wt >= 6400 oz

## 2024-05-03 DIAGNOSIS — J310 Chronic rhinitis: Secondary | ICD-10-CM | POA: Diagnosis not present

## 2024-05-03 MED ORDER — AZELASTINE HCL 0.1 % NA SOLN: 2.0000 | Freq: Two times a day (BID) | NASAL | 11 refills | Status: AC | PRN

## 2024-05-03 MED ORDER — FLUTICASONE PROPIONATE 50 MCG/ACT NA SUSP: 1.0000 | Freq: Every day | NASAL | 11 refills | Status: AC

## 2024-05-03 MED ORDER — LEVOCETIRIZINE DIHYDROCHLORIDE 5 MG PO TABS: 5.0000 mg | ORAL_TABLET | Freq: Every evening | ORAL | 11 refills | Status: AC

## 2024-05-03 NOTE — Patient Instructions (Addendum)
 Chronic Rhinitis: - Positive skin test 10/2023: none  - Use nasal saline rinses before nose sprays such as with Neilmed Sinus Rinse.  Use distilled water.   - Use Flonase  1 sprays each nostril every other daily. Aim upward and outward. - Use Azelastine  1-2 sprays each nostril twice daily as needed for runny nose, drainage, sneezing, congestion. Aim upward and outward. - Use Xyzal 5mg  daily.  This replace Allegra .   - Use saline gel or vaseline for dry nose.

## 2024-05-03 NOTE — Progress Notes (Signed)
   FOLLOW UP Date of Service/Encounter:  05/03/24   Subjective:  Emily Phelps (DOB: 1981-06-15) is a 43 y.o. female who [facility] for follow up for chronic rhinitis.   History obtained from: chart review and patient. Last seen on 11/03/2023 for skin testing that was negative. Started on Flonase /Azelastine .  Since last visit reports, some improvement with nasal sprays and anti histamine- Allegra  but still has drainage.  Also slight bleeding with using Flonase  regularly so now doing nasal sprays PRN; sometimes notes dry nose too.  Tried Zyrtec but got sleepy so went back to Allegra .   Past Medical History: Past Medical History:  Diagnosis Date   Abscess of skin of abdomen 05/15/2022   She has a healing wound to her abdomen, treated with antibiotic. Advised to continue antibiotics until completely gone.   Allergy     Depression    Diabetes mellitus (HCC)    Family history of adverse reaction to anesthesia    mother had n/v after    Family history of breast cancer    Family history of hypertrophic cardiomyopathy 05/13/2021   Family history of kidney cancer    Family history of pancreatic cancer    Family history of prostate cancer    GERD (gastroesophageal reflux disease)    Hyperlipidemia    Hypertension    Legionella pneumonia (HCC) 01/03/2020   Migraine    Obesity    Palpitations 05/13/2021   Panic attacks    Pneumonia    Rectal bleeding    Resistant hypertension 11/28/2014   Sleep apnea    Wears contact lenses     Objective:  BP 136/84 (BP Location: Right Wrist, Patient Position: Sitting, Cuff Size: Large)   Pulse 92   Temp 98.2 F (36.8 C) (Temporal)   Ht 5' 4 (1.626 m)   Wt (!) 411 lb 4.8 oz (186.6 kg)   SpO2 95%   BMI 70.60 kg/m  Body mass index is 70.6 kg/m. Physical Exam: GEN: alert, well developed HEENT: clear conjunctiva, nose with mild inferior turbinate hypertrophy, pink nasal mucosa, slight clear  rhinorrhea, + cobblestoning HEART: regular rate and rhythm, no murmur LUNGS: clear to auscultation bilaterally, no coughing, unlabored respiration SKIN: no rashes or lesions  Assessment:   1. Chronic rhinitis     Plan/Recommendations:   Chronic Rhinitis: - Uncontrolled, discussed use of INCS regularly and will try switching anti histamines.  - Positive skin test 10/2023: none  - Use nasal saline rinses before nose sprays such as with Neilmed Sinus Rinse.  Use distilled water.   - Use Flonase  1 sprays each nostril every other daily. Aim upward and outward. - Use Azelastine  1-2 sprays each nostril twice daily as needed for runny nose, drainage, sneezing, congestion. Aim upward and outward. - Use Xyzal 5mg  daily.  This replace Allegra .   - Use saline gel or vaseline for dry nose.      Return in about 1 year (around 05/03/2025).  Arleta Blanch, MD [facility]

## 2024-05-05 ENCOUNTER — Other Ambulatory Visit: Payer: Self-pay | Admitting: Nurse Practitioner

## 2024-05-30 ENCOUNTER — Other Ambulatory Visit: Payer: Self-pay | Admitting: Nurse Practitioner

## 2024-05-30 DIAGNOSIS — Z1231 Encounter for screening mammogram for malignant neoplasm of breast: Secondary | ICD-10-CM

## 2024-06-06 ENCOUNTER — Other Ambulatory Visit: Payer: Self-pay | Admitting: Physician Assistant

## 2024-06-06 DIAGNOSIS — K219 Gastro-esophageal reflux disease without esophagitis: Secondary | ICD-10-CM

## 2024-06-13 ENCOUNTER — Other Ambulatory Visit: Payer: Self-pay | Admitting: Nurse Practitioner

## 2024-06-13 DIAGNOSIS — F329 Major depressive disorder, single episode, unspecified: Secondary | ICD-10-CM

## 2024-06-13 DIAGNOSIS — F4321 Adjustment disorder with depressed mood: Secondary | ICD-10-CM

## 2024-06-21 ENCOUNTER — Ambulatory Visit: Payer: Self-pay | Admitting: Family Medicine

## 2024-06-21 ENCOUNTER — Encounter: Payer: Self-pay | Admitting: Family Medicine

## 2024-06-21 VITALS — BP 120/78 | HR 100 | Temp 98.1°F | Ht 64.0 in | Wt >= 6400 oz

## 2024-06-21 DIAGNOSIS — G4733 Obstructive sleep apnea (adult) (pediatric): Secondary | ICD-10-CM | POA: Diagnosis not present

## 2024-06-21 DIAGNOSIS — E1165 Type 2 diabetes mellitus with hyperglycemia: Secondary | ICD-10-CM | POA: Diagnosis not present

## 2024-06-21 DIAGNOSIS — Z6841 Body Mass Index (BMI) 40.0 and over, adult: Secondary | ICD-10-CM | POA: Diagnosis not present

## 2024-06-21 DIAGNOSIS — I152 Hypertension secondary to endocrine disorders: Secondary | ICD-10-CM | POA: Diagnosis not present

## 2024-06-21 DIAGNOSIS — E1169 Type 2 diabetes mellitus with other specified complication: Secondary | ICD-10-CM | POA: Insufficient documentation

## 2024-06-21 DIAGNOSIS — E1159 Type 2 diabetes mellitus with other circulatory complications: Secondary | ICD-10-CM | POA: Insufficient documentation

## 2024-06-21 DIAGNOSIS — E119 Type 2 diabetes mellitus without complications: Secondary | ICD-10-CM | POA: Insufficient documentation

## 2024-06-21 DIAGNOSIS — Z9989 Dependence on other enabling machines and devices: Secondary | ICD-10-CM | POA: Diagnosis not present

## 2024-06-21 DIAGNOSIS — E785 Hyperlipidemia, unspecified: Secondary | ICD-10-CM | POA: Diagnosis not present

## 2024-06-21 NOTE — Assessment & Plan Note (Signed)
 Continue using CPAP.

## 2024-06-21 NOTE — Progress Notes (Signed)
 Emily Phelps, CMA,acting as a neurosurgeon for [employer], NP.,have documented all relevant documentation on the behalf of Emily Creighton, NP,as directed by  Emily Creighton, NP while in the presence of Emily Creighton, NP.  Subjective:  Patient ID: Emily Phelps , female    DOB: [birth year] , 43 y.o.   MRN: [id number]  Chief Complaint  Patient presents with   Diabetes    Patient presents today for a diabetes check. Patient reports compliance with her meds. Patient denies having chest pain,sob or headaches at this time.     HPI Discussed the use of AI scribe software for clinical note transcription with the patient, who gave verbal consent to proceed.  History of Present Illness     Afia Phelps is a 43 year old female with diabetes, hypertension, and hyperlipidemia who presents for routine follow-up.  She is managing her diabetes, hypertension, and hyperlipidemia with her current medications, confirming adherence to her regimen. She recently refilled her Wellbutrin . Her cholesterol medication was last filled in September.  She is involved in a weight loss program and mentions that [name] is handling her weight loss surgery. She finds attending multiple appointments challenging due to travel constraints and is working on having some appointments substituted to reduce travel.  No current chest pain or swelling, although she acknowledges some swelling in her feet. She regularly checks her feet for ulcers.  She had her eyes checked in June, although this is not reflected in her medical records. She had a Pap smear this year and is scheduled for a mammogram in two weeks. She performs monthly self-breast exams.  She mentions a past appointment with a neurologist, where an MRI was planned but not completed due to discomfort with the machine. She is unsure about her physical examination status but recalls having a Pap smear recently.  Diabetes She presents for her follow-up diabetic visit. She has  type 2 diabetes mellitus. There are no hypoglycemic associated symptoms. Pertinent negatives for hypoglycemia include no headaches. There are no diabetic associated symptoms. Pertinent negatives for diabetes include no chest pain. There are no hypoglycemic complications. There are no diabetic complications. Risk factors for coronary artery disease include obesity and sedentary lifestyle. Current diabetic treatment includes oral agent (dual therapy). She is compliant with treatment all of the time. Diabetic current diet: she has been doing a keto diet. When asked about meal planning, she reported none. She has not had a previous visit with a dietitian. (Blood sugars are ranging 120-130) An ACE inhibitor/angiotensin II receptor blocker is being taken. She does not see a podiatrist.Eye exam is not current.  Hypertension This is a chronic problem. The current episode started more than 1 year ago. The problem is unchanged. The problem is controlled. Pertinent negatives include no chest pain, headaches or palpitations. Risk factors for coronary artery disease include obesity and diabetes mellitus. There is no history of chronic renal disease.     Past Medical History:  Diagnosis Date   Abscess of skin of abdomen 05/15/2022   She has a healing wound to her abdomen, treated with antibiotic. Advised to continue antibiotics until completely gone.   Allergy     Depression    Diabetes mellitus (HCC)    Family history of adverse reaction to anesthesia    mother had n/v after    Family history of breast cancer    Family history of hypertrophic cardiomyopathy 05/13/2021   Family history of kidney cancer    Family history of pancreatic cancer  Family history of prostate cancer    GERD (gastroesophageal reflux disease)    Hyperlipidemia    Hypertension    Legionella pneumonia (HCC) 01/03/2020   Migraine    Obesity    Palpitations 05/13/2021   Panic attacks    Pneumonia    Rectal bleeding    Resistant  hypertension 11/28/2014   Sleep apnea    Wears contact lenses      Family History  Problem Relation Age of Onset   Hypertension Mother    Colon polyps Mother    Asthma Mother    Diabetes Father    Hypertension Father    Prostate cancer Father 26   Pancreatic cancer Father 41   Arthritis Father    Vision loss Father    Hypertension Brother    Sleep apnea Brother    Sleep apnea Maternal Grandmother    Hypertension Maternal Grandmother    Heart disease Maternal Grandmother    Hypertrophic cardiomyopathy Maternal Grandmother    Thyroid  disease Maternal Aunt    Heart disease Maternal Uncle    Kidney failure Maternal Uncle    Sleep apnea Maternal Uncle    Cancer Paternal Aunt        Janaiah Vetrano 1/2 aunt with cancer NOS   Pancreatic cancer Paternal Uncle    Breast cancer Cousin        dx < 50   Breast cancer Cousin        dx < 50   Sleep apnea Other        maternal side several family members   Colon cancer Neg Hx    Esophageal cancer Neg Hx    Liver cancer Neg Hx    Stomach cancer Neg Hx    Rectal cancer Neg Hx    Migraines Neg Hx      Current Outpatient Medications:    albuterol  (VENTOLIN  HFA) 108 (90 Base) MCG/ACT inhaler, Inhale 2 puffs into the lungs every 6 (six) hours as needed for wheezing or shortness of breath., Disp: 6.7 g, Rfl: 0   amLODipine  (NORVASC ) 10 MG tablet, TAKE 1 TABLET BY MOUTH EVERY DAY, Disp: 90 tablet, Rfl: 1   atenolol  (TENORMIN ) 100 MG tablet, TAKE 1 TABLET BY MOUTH EVERY DAY, Disp: 90 tablet, Rfl: 3   atorvastatin  (LIPITOR) 10 MG tablet, TAKE 1 TABLET BY MOUTH EVERY DAY, Disp: 30 tablet, Rfl: 2   azelastine  (ASTELIN ) 0.1 % nasal spray, Place 2 sprays into both nostrils 2 (two) times daily as needed for rhinitis. Use in each nostril as directed, Disp: 30 mL, Rfl: 11   buPROPion  (WELLBUTRIN  XL) 150 MG 24 hr tablet, TAKE 1 TABLET BY MOUTH EVERY MORNING, Disp: 30 tablet, Rfl: 2   Continuous Blood Gluc Receiver (DEXCOM G6 RECEIVER) DEVI, Use to check blood  sugars dx code e11.65, Disp: 3 each, Rfl: 3   Continuous Glucose Sensor (DEXCOM G7 SENSOR) MISC, Use as directed to monitor blood sugars, Disp: 4 each, Rfl: 3   cyclobenzaprine  (FLEXERIL ) 10 MG tablet, Take 1 tablet (10 mg total) by mouth 3 (three) times daily as needed for muscle spasms., Disp: 30 tablet, Rfl: 0   dicyclomine  (BENTYL ) 10 MG capsule, TAKE 1 CAPSULE BY MOUTH 4 TIMES DAILY AS NEEDED FOR SPASMS, Disp: 30 capsule, Rfl: 2   doxycycline  (VIBRAMYCIN ) 50 MG capsule, Take 50 mg by mouth 2 (two) times daily., Disp: , Rfl:    EYSUVIS 0.25 % SUSP, , Disp: , Rfl:    FARXIGA  5 MG TABS tablet, TAKE  1 TABLET BY MOUTH EVERY DAY, Disp: 90 tablet, Rfl: 2   ferrous sulfate 325 (65 FE) MG tablet, Take 325 mg by mouth daily with breakfast., Disp: , Rfl:    fexofenadine  (ALLERGY  RELIEF) 180 MG tablet, Take 1 tablet (180 mg total) by mouth daily., Disp: 90 tablet, Rfl: 3   fluticasone  (FLONASE ) 50 MCG/ACT nasal spray, Place 1 spray into both nostrils daily., Disp: 16 g, Rfl: 11   ibuprofen  (ADVIL ) 800 MG tablet, Take 800 mg by mouth every 6 (six) hours as needed., Disp: , Rfl:    Ketoprofen (FROTEK) 10 % CREA, Apply 1 application  topically 4 (four) times daily., Disp: , Rfl:    Lancets (UNILET COMFORTOUCH LANCET) MISC, check blood sugar 3 TIMES DAILY AS DIRECTED, Disp: 200 each, Rfl: 1   levocetirizine (XYZAL ) 5 MG tablet, Take 1 tablet (5 mg total) by mouth every evening., Disp: 30 tablet, Rfl: 11   Magnesium  Glycinate 100 MG CAPS, TAKE 1 CAPSULE BY MOUTH EVERY EVENING, Disp: 30 capsule, Rfl: 3   Multiple Vitamin (MULTIVITAMIN WITH MINERALS) TABS tablet, Take 1 tablet by mouth daily., Disp: , Rfl:    mupirocin  ointment (BACTROBAN ) 2 %, Apply 1 Application topically 2 (two) times daily., Disp: 22 g, Rfl: 0   omeprazole  (PRILOSEC) 40 MG capsule, TAKE 1 CAPSULE BY MOUTH 2 TIMES DAILY, Disp: 180 capsule, Rfl: 3   ondansetron  (ZOFRAN ) 4 MG tablet, Take 1 tablet (4 mg total) by mouth every 4 (four) hours as  needed for nausea or vomiting., Disp: 6 tablet, Rfl: 0   progesterone  (PROMETRIUM ) 100 MG capsule, Take 200 mg by mouth at bedtime., Disp: , Rfl:    progesterone  (PROMETRIUM ) 100 MG capsule, Take 2 capsules by mouth daily., Disp: , Rfl:    spironolactone  (ALDACTONE ) 50 MG tablet, TAKE 1 TABLET BY MOUTH EVERY DAY, Disp: 90 tablet, Rfl: 3   tamsulosin (FLOMAX) 0.4 MG CAPS capsule, Take 0.4 mg by mouth daily., Disp: , Rfl:    tirzepatide  (MOUNJARO ) 15 MG/0.5ML Pen, inject 15 MG into THE SKIN ONCE WEEKLY, Disp: 6 mL, Rfl: 3   topiramate  (TOPAMAX ) 50 MG tablet, Take 2 tablets (100 mg total) by mouth 2 (two) times daily., Disp: 120 tablet, Rfl: 5   traMADol  (ULTRAM ) 50 MG tablet, Take 1-2 tablets (50-100 mg total) by mouth every 6 (six) hours as needed for moderate pain., Disp: 15 tablet, Rfl: 0   Ubrogepant  (UBRELVY ) 100 MG TABS, Take 1 tablet (100 mg total) by mouth as needed. Not for daily use., Disp: 10 tablet, Rfl: 3   valsartan  (DIOVAN ) 320 MG tablet, TAKE 1 TABLET BY MOUTH EVERY DAY, Disp: 90 tablet, Rfl: 1   Vitamin D , Ergocalciferol , (DRISDOL ) 1.25 MG (50000 UNIT) CAPS capsule, TAKE 1 CAPSULE BY MOUTH EVERY 7 DAYS, Disp: 12 capsule, Rfl: 1   Continuous Glucose Transmitter (DEXCOM G6 TRANSMITTER) MISC, CHECK BLOOD SUGAR BEFORE   MEALS AND AT BEDTIME, Disp: 1 each, Rfl: PRN   Allergies  Allergen Reactions   Latex Rash   Silicone Rash    Other reaction(s): Unknown   Tape Rash    Other reaction(s): Unknown Other reaction(s): Unknown     Review of Systems  Respiratory: Negative.    Cardiovascular:  Negative for chest pain and palpitations.  Genitourinary: Negative.   Musculoskeletal: Negative.   Skin: Negative.   Neurological:  Negative for headaches.  Psychiatric/Behavioral: Negative.       Today's Vitals   06/21/24 1135  BP: 120/78  Pulse: 100  Temp: 98.1 F (36.7 C)  TempSrc: Oral  Weight: (!) 420 lb (190.5 kg)  Height: 5' 4 (1.626 m)  PainSc: 0-No pain   Body mass index  is 72.09 kg/m.  Wt Readings from Last 3 Encounters:  06/21/24 (!) 420 lb (190.5 kg)  05/03/24 (!) 411 lb 4.8 oz (186.6 kg)  04/19/24 (!) 407 lb (184.6 kg)    The 10-year ASCVD risk score (Arnett DK, et al., 2019) is: 6.8%   Values used to calculate the score:     Age: 24 years     Clincally relevant sex: Female     Is Non-Hispanic African American: Yes     Diabetic: Yes     Tobacco smoker: No     Systolic Blood Pressure: 120 mmHg     Is BP treated: Yes     HDL Cholesterol: 31 mg/dL     Total Cholesterol: 139 mg/dL  Objective:  Physical Exam Constitutional:      Appearance: Normal appearance.  HENT:     Head: Normocephalic.  Cardiovascular:     Rate and Rhythm: Normal rate and regular rhythm.     Pulses: Normal pulses.     Heart sounds: Normal heart sounds.  Pulmonary:     Effort: Pulmonary effort is normal.     Breath sounds: Normal breath sounds.  Abdominal:     General: Bowel sounds are normal.  Neurological:     Mental Status: She is alert.         Assessment And Plan:   Assessment & Plan Type 2 diabetes mellitus with hyperglycemia, without long-term current use of insulin  (HCC) - Continue current diabetes medication regimen. - Scheduled follow-up in four months for diabetes management.  Hypertension associated with diabetes (HCC) Chronic, well controlled today, continue current treatment regimen OSA on CPAP Continue using CPAP Morbid obesity with BMI of 70 and over, adult [facility]) Management includes weight loss surgery consultation. Coordination with [name] for appointments is ongoing. Insurance requires specific facility visits. - Continue coordination with [name] for weight loss surgery and appointments. Hyperlipidemia associated with type 2 diabetes mellitus (HCC) Continue statin daily  Orders Placed This Encounter  Procedures   CBC   CMP14+EGFR   Lipid panel   Hemoglobin A1c      Return for controlled DM check-4 months, physical with DOROTHA Ada DNP_C.   Patient was given opportunity to ask questions. Patient verbalized understanding of the plan and was able to repeat key elements of the plan. All questions were answered to their satisfaction.    I, Emily Creighton, NP, have reviewed all documentation for this visit. The documentation on 06/21/2024 for the exam, diagnosis, procedures, and orders are all accurate and complete.   IF YOU HAVE BEEN REFERRED TO A SPECIALIST, IT MAY TAKE 1-2 WEEKS TO SCHEDULE/PROCESS THE REFERRAL. IF YOU HAVE NOT HEARD FROM US /SPECIALIST IN TWO WEEKS, PLEASE GIVE US  A CALL AT [phone].

## 2024-06-21 NOTE — Assessment & Plan Note (Signed)
 Management includes weight loss surgery consultation. Coordination with [name] for appointments is ongoing. Insurance requires specific facility visits. - Continue coordination with [name] for weight loss surgery and appointments.

## 2024-06-21 NOTE — Assessment & Plan Note (Signed)
 Chronic, well controlled today, continue current treatment regimen

## 2024-06-21 NOTE — Assessment & Plan Note (Signed)
 Continue statin daily.

## 2024-06-21 NOTE — Patient Instructions (Signed)

## 2024-06-22 LAB — CBC
Hematocrit: 40.9 % (ref 34.0–46.6)
Hemoglobin: 13.1 g/dL (ref 11.1–15.9)
MCH: 26.3 pg — ABNORMAL LOW (ref 26.6–33.0)
MCHC: 32 g/dL (ref 31.5–35.7)
MCV: 82 fL (ref 79–97)
Platelets: 461 x10E3/uL — ABNORMAL HIGH (ref 150–450)
RBC: 4.99 x10E6/uL (ref 3.77–5.28)
RDW: 17 % — ABNORMAL HIGH (ref 11.7–15.4)
WBC: 8.4 x10E3/uL (ref 3.4–10.8)

## 2024-06-22 LAB — LIPID PANEL
Chol/HDL Ratio: 4.2 ratio (ref 0.0–4.4)
Cholesterol, Total: 151 mg/dL (ref 100–199)
HDL: 36 mg/dL — ABNORMAL LOW (ref >39)
LDL Chol Calc (NIH): 87 mg/dL (ref 0–99)
Triglycerides: 161 mg/dL — ABNORMAL HIGH (ref 0–149)
VLDL Cholesterol Cal: 28 mg/dL (ref 5–40)

## 2024-06-22 LAB — CMP14+EGFR
ALT: 27 IU/L (ref 0–32)
AST: 21 IU/L (ref 0–40)
Albumin: 4.4 g/dL (ref 3.9–4.9)
Alkaline Phosphatase: 104 IU/L (ref 41–116)
BUN/Creatinine Ratio: 8 — ABNORMAL LOW (ref 9–23)
BUN: 7 mg/dL (ref 6–24)
Bilirubin Total: 0.4 mg/dL (ref 0.0–1.2)
CO2: 22 mmol/L (ref 20–29)
Calcium: 9.9 mg/dL (ref 8.7–10.2)
Chloride: 104 mmol/L (ref 96–106)
Creatinine, Ser: 0.9 mg/dL (ref 0.57–1.00)
Globulin, Total: 2.9 g/dL (ref 1.5–4.5)
Glucose: 95 mg/dL (ref 70–99)
Potassium: 4 mmol/L (ref 3.5–5.2)
Sodium: 139 mmol/L (ref 134–144)
Total Protein: 7.3 g/dL (ref 6.0–8.5)
eGFR: 83 mL/min/1.73 (ref >59)

## 2024-06-22 LAB — HEMOGLOBIN A1C
Est. average glucose Bld gHb Est-mCnc: 111 mg/dL
Hgb A1c MFr Bld: 5.5 % (ref 4.8–5.6)

## 2024-06-27 ENCOUNTER — Other Ambulatory Visit: Payer: Self-pay | Admitting: Nurse Practitioner

## 2024-06-29 ENCOUNTER — Inpatient Hospital Stay
Admission: RE | Admit: 2024-06-29 | Discharge: 2024-06-29 | Attending: Nurse Practitioner | Admitting: Nurse Practitioner

## 2024-06-29 DIAGNOSIS — Z1231 Encounter for screening mammogram for malignant neoplasm of breast: Secondary | ICD-10-CM

## 2024-07-09 ENCOUNTER — Other Ambulatory Visit: Payer: Self-pay | Admitting: Cardiovascular Disease
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
## 2024-07-28 ENCOUNTER — Encounter (HOSPITAL_BASED_OUTPATIENT_CLINIC_OR_DEPARTMENT_OTHER): Payer: Self-pay

## 2024-07-28 ENCOUNTER — Emergency Department (HOSPITAL_BASED_OUTPATIENT_CLINIC_OR_DEPARTMENT_OTHER)
Admission: EM | Admit: 2024-07-28 | Discharge: 2024-07-28 | Disposition: A | Discharge status: Home / Self Care | Attending: Emergency Medicine | Admitting: Emergency Medicine

## 2024-07-28 ENCOUNTER — Other Ambulatory Visit: Payer: Self-pay

## 2024-07-28 ENCOUNTER — Emergency Department (HOSPITAL_BASED_OUTPATIENT_CLINIC_OR_DEPARTMENT_OTHER)

## 2024-07-28 DIAGNOSIS — X501XXA Overexertion from prolonged static or awkward postures, initial encounter: Secondary | ICD-10-CM | POA: Insufficient documentation

## 2024-07-28 DIAGNOSIS — Z91040 Latex allergy status: Secondary | ICD-10-CM | POA: Insufficient documentation

## 2024-07-28 DIAGNOSIS — S86812A Strain of other muscle(s) and tendon(s) at lower leg level, left leg, initial encounter: Secondary | ICD-10-CM | POA: Insufficient documentation

## 2024-07-28 DIAGNOSIS — S8992XA Unspecified injury of left lower leg, initial encounter: Secondary | ICD-10-CM | POA: Diagnosis present

## 2024-07-28 MED ORDER — OXYCODONE-ACETAMINOPHEN 5-325 MG PO TABS: 1.0000 | ORAL_TABLET | ORAL | 0 refills | Status: AC | PRN

## 2024-07-28 MED ORDER — OXYCODONE-ACETAMINOPHEN 5-325 MG PO TABS: 1.0000 | ORAL_TABLET | ORAL | 0 refills | Status: DC | PRN

## 2024-07-28 NOTE — Discharge Instructions (Addendum)
 Ice to area of swelling.  Follow up with Orthopaedist for evaluation.

## 2024-07-28 NOTE — ED Provider Notes (Signed)
 " [place] [facility] AT [facility] Provider Note   CSN: [id number] Arrival date & time: 07/28/24  1520     Patient presents with: Knee Pain (L)   Emily Phelps is a 45 y.o. female.   Patient complains of left leg pain.  Patient states that she was unpacking in her apartment and twisted slipped and fell injuring her left knee.  Patient complains of pain swelling and pain.  Patient reports that she heard a loud pop when she twisted before falling down.  Patient reports knee is painful.  Patient has been able to walk but with difficulty.  Patient has had a previous right knee injury.  Patient is not currently seeing an orthopedist.  Patient denies any other area of injury she did drive to the emergency department.  Patient states she did not strike her head.  The history is provided by the patient. No language interpreter was used.  Knee Pain      Prior to Admission medications  Medication Sig Start Date End Date Taking? Authorizing Provider  albuterol  (VENTOLIN  HFA) 108 (90 Base) MCG/ACT inhaler Inhale 2 puffs into the lungs every 6 (six) hours as needed for wheezing or shortness of breath. 10/18/19   Singh, Prashant K, MD  amLODipine  (NORVASC ) 10 MG tablet TAKE 1 TABLET BY MOUTH EVERY DAY 04/22/24   Raford Riggs, MD  atenolol  (TENORMIN ) 100 MG tablet TAKE 1 TABLET BY MOUTH EVERY DAY 06/16/23   Raford Riggs, MD  atorvastatin  (LIPITOR) 10 MG tablet TAKE 1 TABLET BY MOUTH EVERY DAY 06/27/24   Moore, Janece, FNP  azelastine  (ASTELIN ) 0.1 % nasal spray Place 2 sprays into both nostrils 2 (two) times daily as needed for rhinitis. Use in each nostril as directed 05/03/24   Tobie Arleta SQUIBB, MD  buPROPion  (WELLBUTRIN  XL) 150 MG 24 hr tablet TAKE 1 TABLET BY MOUTH EVERY MORNING 06/13/24   Georgina Speaks, FNP  Continuous Blood Gluc Receiver (DEXCOM G6 RECEIVER) DEVI Use to check blood sugars dx code e11.65 06/05/20   Georgina Speaks, FNP  Continuous Glucose Sensor (DEXCOM  G7 SENSOR) MISC Use as directed to monitor blood sugars 02/23/24   Georgina Speaks, FNP  Continuous Glucose Transmitter (DEXCOM G6 TRANSMITTER) MISC CHECK BLOOD SUGAR BEFORE   MEALS AND AT BEDTIME 05/05/24   Moore, Janece, FNP  cyclobenzaprine  (FLEXERIL ) 10 MG tablet Take 1 tablet (10 mg total) by mouth 3 (three) times daily as needed for muscle spasms. 12/02/22   Georgina Speaks, FNP  dicyclomine  (BENTYL ) 10 MG capsule TAKE 1 CAPSULE BY MOUTH 4 TIMES DAILY AS NEEDED FOR SPASMS 04/22/24   Craig Alan SAUNDERS, PA-C  doxycycline  (VIBRAMYCIN ) 50 MG capsule Take 50 mg by mouth 2 (two) times daily. 02/16/24   [provider]  EYSUVIS 0.25 % SUSP  02/16/24   [provider]  FARXIGA  5 MG TABS tablet TAKE 1 TABLET BY MOUTH EVERY DAY 01/12/24   Georgina Speaks, FNP  ferrous sulfate 325 (65 FE) MG tablet Take 325 mg by mouth daily with breakfast.    [provider]  fexofenadine  (ALLERGY  RELIEF) 180 MG tablet Take 1 tablet (180 mg total) by mouth daily. 11/03/23   Tobie Arleta SQUIBB, MD  fluticasone  (FLONASE ) 50 MCG/ACT nasal spray Place 1 spray into both nostrils daily. 05/03/24   Tobie Arleta SQUIBB, MD  ibuprofen  (ADVIL ) 800 MG tablet Take 800 mg by mouth every 6 (six) hours as needed. 03/29/24   [provider]  Ketoprofen (FROTEK) 10 % CREA Apply  1 application  topically 4 (four) times daily.    [provider]  Lancets [facility] LANCET) MISC check blood sugar 3 TIMES DAILY AS DIRECTED 07/02/22   Moore, Janece, FNP  levocetirizine (XYZAL ) 5 MG tablet Take 1 tablet (5 mg total) by mouth every evening. 05/03/24   Tobie Arleta SQUIBB, MD  Magnesium  Glycinate 100 MG CAPS TAKE 1 CAPSULE BY MOUTH EVERY EVENING 04/12/24   Georgina Speaks, FNP  Multiple Vitamin (MULTIVITAMIN WITH MINERALS) TABS tablet Take 1 tablet by mouth daily.    [provider]  mupirocin  ointment (BACTROBAN ) 2 % Apply 1 Application topically 2 (two) times daily. 05/05/22   Georgina Speaks, FNP  omeprazole   (PRILOSEC) 40 MG capsule TAKE 1 CAPSULE BY MOUTH 2 TIMES DAILY 06/06/24   Collier, Amanda R, PA-C  ondansetron  (ZOFRAN ) 4 MG tablet Take 1 tablet (4 mg total) by mouth every 4 (four) hours as needed for nausea or vomiting. 03/20/22   Elnor Jayson LABOR, DO  oxyCODONE -acetaminophen  (PERCOCET) 5-325 MG tablet Take 1 tablet by mouth every 4 (four) hours as needed for severe pain (pain score 7-10). 07/28/24 07/28/25  Codie Hainer K, PA-C  progesterone  (PROMETRIUM ) 100 MG capsule Take 200 mg by mouth at bedtime. 05/26/22   [provider]  progesterone  (PROMETRIUM ) 100 MG capsule Take 2 capsules by mouth daily. 10/01/23   [provider]  spironolactone  (ALDACTONE ) 50 MG tablet Take 1 tablet (50 mg total) by mouth daily. PLEASE KEEP UPCOMING APPOINTMENT IN ORDER TO RECEIVE ADDITIONAL REFILLS, THANK YOU! 07/11/24   Raford Riggs, MD  tamsulosin (FLOMAX) 0.4 MG CAPS capsule Take 0.4 mg by mouth daily. 07/27/23   [provider]  tirzepatide  (MOUNJARO ) 15 MG/0.5ML Pen inject 15 MG into THE SKIN ONCE WEEKLY 11/09/23   Raford Riggs, MD  topiramate  (TOPAMAX ) 50 MG tablet Take 2 tablets (100 mg total) by mouth 2 (two) times daily. 04/05/24   Athar, Saima, MD  traMADol  (ULTRAM ) 50 MG tablet Take 1-2 tablets (50-100 mg total) by mouth every 6 (six) hours as needed for moderate pain. 11/06/21   Eletha Boas, MD  Ubrogepant  (UBRELVY ) 100 MG TABS Take 1 tablet (100 mg total) by mouth as needed. Not for daily use. 04/05/24   Buck Saucer, MD  valsartan  (DIOVAN ) 320 MG tablet TAKE 1 TABLET BY MOUTH EVERY DAY 02/03/24   Raford Riggs, MD  Vitamin D , Ergocalciferol , (DRISDOL ) 1.25 MG (50000 UNIT) CAPS capsule TAKE 1 CAPSULE BY MOUTH EVERY 7 DAYS 05/02/24   Georgina Speaks, FNP    Allergies: Latex, Silicone, and Tape    Review of Systems  All other systems reviewed and are negative.   Updated Vital Signs BP (!) 119/59   Pulse 82   Temp 97.8 F (36.6 C) (Oral)   Resp 15   SpO2 100%    Physical Exam Vitals reviewed.  Constitutional:      Appearance: Normal appearance.  Cardiovascular:     Rate and Rhythm: Normal rate.  Pulmonary:     Effort: Pulmonary effort is normal.  Musculoskeletal:        General: Swelling, tenderness and signs of injury present. No deformity.     Comments: Tender left knee supra and infra patella, swollen, minimal range of motion.  Patient cannot tolerate bending.  Patient cannot lift  Skin:    General: Skin is warm.  Neurological:     General: No focal deficit present.     Mental Status: She is alert.     (all labs  ordered are listed, but only abnormal results are displayed) Labs Reviewed - No data to display  EKG: None  Radiology: DG Knee 2 Views Left Result Date: 07/28/2024 CLINICAL DATA:  Left knee pain after injury moving furniture yesterday. EXAM: LEFT KNEE - 1-2 VIEW COMPARISON:  None Available. FINDINGS: No definite fracture is noted. Minimal to mild spurring is seen involving the medial and lateral joint spaces without joint space narrowing. However, patella alta is noted which may be idiopathic or potentially related to patellar tendon injury or rupture. No definite joint effusion is noted. IMPRESSION: Patella alta is noted which may be idiopathic or potentially related to patellar tendon injury or rupture. MRI may be performed for further evaluation. Electronically Signed   By: Lynwood Landy Raddle M.D.   On: 07/28/2024 16:49     Procedures   Medications Ordered in the ED - No data to display                                  Medical Decision Making She complains of pain in her left knee after twisting and falling.  Patient had a loud pop  Amount and/or Complexity of Data Reviewed Radiology: ordered and independent interpretation performed. Decision-making details documented in ED Course.    Details: X-ray shows patella alta.  This is concerning for patella tendon injury Discussion of management or test interpretation with  external provider(s): I discussed the patient with Dr. Elsa orthopedist on-call.  He advised patient will need to be placed in a brace.  He advised patient will need to be followed up by Dr. Sherida   Risk Prescription drug management. Risk Details: Patient is counseled on x-ray findings and concern for patella injury.  She is advised to call Dr. Sherida  tomorrow patient is placed in a modified knee immobilizer.  Patient is given a prescription for Percocet.  Patient is given a walker to assist in ambulation.  She is discharged in stable condition        Final diagnoses:  Knee injury, left, initial encounter  Rupture of left patellar tendon, initial encounter    ED Discharge Orders          Ordered    oxyCODONE -acetaminophen  (PERCOCET) 5-325 MG tablet  Every 4 hours PRN,   Status:  Discontinued        07/28/24 1947    oxyCODONE -acetaminophen  (PERCOCET) 5-325 MG tablet  Every 4 hours PRN        07/28/24 2029           An After Visit Summary was printed and given to the patient.     Flint Sonny POUR, PA-C 07/28/24 2135    Ruthe Cornet, DO 07/28/24 2217  "

## 2024-07-28 NOTE — ED Triage Notes (Signed)
 Pt c/o L knee pain/ numbness shooting down to my foot. Advises limited ROM w pain associated, but ambulatory to triage. Injury yesterday, advises she was moving furniture, tripping over stuff in my house. Advises she felt/ heard pop yesterday, still doing it today.

## 2024-08-01 ENCOUNTER — Other Ambulatory Visit: Payer: Self-pay | Admitting: Nurse Practitioner

## 2024-08-01 ENCOUNTER — Other Ambulatory Visit: Payer: Self-pay | Admitting: Cardiovascular Disease

## 2024-08-01 DIAGNOSIS — E119 Type 2 diabetes mellitus without complications: Secondary | ICD-10-CM

## 2024-08-08 ENCOUNTER — Ambulatory Visit: Admitting: Nurse Practitioner

## 2024-08-09 ENCOUNTER — Telehealth: Admitting: Student

## 2024-08-09 ENCOUNTER — Ambulatory Visit (HOSPITAL_BASED_OUTPATIENT_CLINIC_OR_DEPARTMENT_OTHER): Admitting: Cardiovascular Disease

## 2024-08-09 DIAGNOSIS — J019 Acute sinusitis, unspecified: Secondary | ICD-10-CM

## 2024-08-09 DIAGNOSIS — J069 Acute upper respiratory infection, unspecified: Secondary | ICD-10-CM

## 2024-08-09 MED ORDER — PROMETHAZINE-DM 6.25-15 MG/5ML PO SYRP: 5.0000 mL | ORAL_SOLUTION | Freq: Four times a day (QID) | ORAL | 0 refills | Status: AC | PRN

## 2024-08-09 MED ORDER — AMOXICILLIN-POT CLAVULANATE 875-125 MG PO TABS: 1.0000 | ORAL_TABLET | Freq: Two times a day (BID) | ORAL | 0 refills | Status: AC

## 2024-08-09 MED ORDER — ALBUTEROL SULFATE HFA 108 (90 BASE) MCG/ACT IN AERS: 1.0000 | INHALATION_SPRAY | Freq: Four times a day (QID) | RESPIRATORY_TRACT | 0 refills | Status: AC | PRN

## 2024-08-09 NOTE — Progress Notes (Signed)
 " Virtual Visit Consent   Emily Phelps, you are scheduled for a virtual visit with a [facility] provider today. Just as with appointments in the office, your consent must be obtained to participate. Your consent will be active for this visit and any virtual visit you may have with one of our providers in the next 365 days. If you have a MyChart account, a copy of this consent can be sent to you electronically.  As this is a virtual visit, video technology does not allow for your provider to perform a traditional examination. This may limit your provider's ability to fully assess your condition. If your provider identifies any concerns that need to be evaluated in person or the need to arrange testing (such as labs, EKG, etc.), we will make arrangements to do so. Although advances in technology are sophisticated, we cannot ensure that it will always work on either your end or our end. If the connection with a video visit is poor, the visit may have to be switched to a telephone visit. With either a video or telephone visit, we are not always able to ensure that we have a secure connection.  By engaging in this virtual visit, you consent to the provision of healthcare and authorize for your insurance to be billed (if applicable) for the services provided during this visit. Depending on your insurance coverage, you may receive a charge related to this service.  I need to obtain your verbal consent now. Are you willing to proceed with your visit today? Emily Phelps has provided verbal consent on 08/09/2024 for a virtual visit (video or telephone). Emily FORBES Molly, PA-C  Date: 08/09/2024 2:11 PM   Virtual Visit via Video Note   I, Emily Phelps, connected with  Emily Phelps  ([id number], [birth year]) on 08/09/24 at  2:00 PM EST by a video-enabled telemedicine application and verified that I am speaking with the correct person using two identifiers.  Location: Patient: Virtual Visit Location Patient:  Home Provider: Virtual Visit Location Provider: Home Office   I discussed the limitations of evaluation and management by telemedicine and the availability of in person appointments. The patient expressed understanding and agreed to proceed.    History of Present Illness: Emily Phelps is a 44 y.o. who identifies as a female who was assigned female at birth, and is being seen today for cough and congestion, present for 1 week.  -Endorses: chest congestion, nasal congestion (thick, with scant blood). Decreased appetite. Cough is worse at night. Describes cough as nonproductive. Denies SOB, but endorses DOE.  -Denies: n/v/d/c, fevers -Duration of symptoms: 1 week -Interventions attempted: Mucinex , nyquil, honey. Xyzal . Nasal saline. - She was prescribed an albuterol  inhaler when she had covid few years ago, though does not have one at home anymore. -The patient denies a history of pulmonary disease.  Never smoker. -Deneis pregnancy or breastfeeding.  HPI: HPI  Problems:  Patient Active Problem List   Diagnosis Date Noted   Hyperlipidemia associated with type 2 diabetes mellitus (HCC) 06/21/2024   Hypertension associated with diabetes (HCC) 06/21/2024   Obesity, diabetes, and hypertension syndrome (HCC) 06/21/2024   Controlled type 2 diabetes mellitus without complication, with long-term current use of insulin  (HCC) 03/10/2024   Splitting of nail 03/10/2024   Vitamin D  deficiency 03/10/2024   Vitamin B12 deficiency 12/01/2023   B12 deficiency 10/18/2023   Gum inflammation 09/11/2023   Anxiety 09/11/2023   Type 2 diabetes mellitus with obesity 06/09/2023   Need for influenza vaccination  06/09/2023   Anemia 06/09/2023   Other insomnia 06/09/2023   Genetic testing 03/31/2023   Family history of prostate cancer    Family history of kidney cancer    Family history of breast cancer    Grief 02/11/2023   Family history of pancreatic cancer 02/11/2023   Class 3 severe obesity due to  excess calories with serious comorbidity and body mass index (BMI) greater than or equal to 70 in adult (HCC) 02/11/2023   Left leg swelling 12/02/2022   Closed fracture of right lower extremity 12/02/2022   Urine frequency 12/02/2022   Reactive depression 12/02/2022   Morbid obesity with BMI of 70 and over, adult (HCC) 09/08/2022   Status post parathyroidectomy 11/20/2021   Hyperparathyroidism, primary 11/06/2021   Palpitations 05/13/2021   Family history of hypertrophic cardiomyopathy 05/13/2021   Morbid obesity with BMI of 60.0-69.9, adult (HCC) 380 lbs 01/04/2020   OSA on CPAP 10/08/2019   Type 2 diabetes mellitus with hyperlipidemia (HCC) 05/20/2019   Numbness and tingling in left hand 05/20/2019   Hypokalemia    Dyspnea 03/11/2019   Panic anxiety syndrome 03/03/2019   Neck pain 01/07/2019   Essential hypertension 11/28/2014   GERD (gastroesophageal reflux disease) 11/28/2014   Migraines 11/28/2014   Hyperlipidemia 11/28/2014   Allergic rhinitis 11/28/2014    Allergies: Allergies[1] Medications: Current Medications[2]  Observations/Objective: Patient is well-developed, well-nourished in no acute distress.  Resting comfortably and in no acute distress at home.  Head is normocephalic, atraumatic.  No labored breathing.  Speech is clear and coherent with logical content.  Patient is alert and oriented at baseline.    Assessment and Plan: 1. Viral URI with cough (Primary)  2. Acute non-recurrent sinusitis, unspecified location  Patient is a pleasant 44 y.o. female presenting with viral URI with cough, and sinusitis.  The patient states that she is not pregnant or breast-feeding.  Based on the duration of her symptoms, I do have concern for bacterial sinusitis.  Augmentin  sent. also refilled her albuterol  inhaler, which was prescribed during a previous illness.  Promethazine  DM sent for symptomatic relief.  Continue over-the-counter medications if they are  helping.  Return precautions: If symptoms are not improving within 48 hours.   Follow Up Instructions: I discussed the assessment and treatment plan with the patient. The patient was provided an opportunity to ask questions and all were answered. The patient agreed with the plan and demonstrated an understanding of the instructions.  A copy of instructions were sent to the patient via MyChart unless otherwise noted below.    The patient was advised to call back or seek an in-person evaluation if the symptoms worsen or if the condition fails to improve as anticipated.    Emily FORBES Molly, PA-C'     [1]  Allergies Allergen Reactions   Latex Rash   Silicone Rash    Other reaction(s): Unknown   Tape Rash    Other reaction(s): Unknown Other reaction(s): Unknown  [2]  Current Outpatient Medications:    albuterol  (VENTOLIN  HFA) 108 (90 Base) MCG/ACT inhaler, Inhale 2 puffs into the lungs every 6 (six) hours as needed for wheezing or shortness of breath., Disp: 6.7 g, Rfl: 0   amLODipine  (NORVASC ) 10 MG tablet, TAKE 1 TABLET BY MOUTH EVERY DAY, Disp: 90 tablet, Rfl: 1   atenolol  (TENORMIN ) 100 MG tablet, TAKE 1 TABLET BY MOUTH EVERY DAY, Disp: 90 tablet, Rfl: 3   atorvastatin  (LIPITOR) 10 MG tablet, TAKE 1 TABLET BY MOUTH EVERY DAY,  Disp: 30 tablet, Rfl: 2   azelastine  (ASTELIN ) 0.1 % nasal spray, Place 2 sprays into both nostrils 2 (two) times daily as needed for rhinitis. Use in each nostril as directed, Disp: 30 mL, Rfl: 11   buPROPion  (WELLBUTRIN  XL) 150 MG 24 hr tablet, TAKE 1 TABLET BY MOUTH EVERY MORNING, Disp: 30 tablet, Rfl: 2   Continuous Blood Gluc Receiver (DEXCOM G6 RECEIVER) DEVI, Use to check blood sugars dx code e11.65, Disp: 3 each, Rfl: 3   Continuous Glucose Sensor (DEXCOM G7 SENSOR) MISC, USE AS DIRECTED TO monitor blood sugar, Disp: 4 each, Rfl: 3   Continuous Glucose Transmitter (DEXCOM G6 TRANSMITTER) MISC, CHECK BLOOD SUGAR BEFORE   MEALS AND AT BEDTIME, Disp: 1 each,  Rfl: PRN   cyclobenzaprine  (FLEXERIL ) 10 MG tablet, Take 1 tablet (10 mg total) by mouth 3 (three) times daily as needed for muscle spasms., Disp: 30 tablet, Rfl: 0   dicyclomine  (BENTYL ) 10 MG capsule, TAKE 1 CAPSULE BY MOUTH 4 TIMES DAILY AS NEEDED FOR SPASMS, Disp: 30 capsule, Rfl: 2   doxycycline  (VIBRAMYCIN ) 50 MG capsule, Take 50 mg by mouth 2 (two) times daily., Disp: , Rfl:    EYSUVIS 0.25 % SUSP, , Disp: , Rfl:    FARXIGA  5 MG TABS tablet, TAKE 1 TABLET BY MOUTH EVERY DAY, Disp: 90 tablet, Rfl: 2   ferrous sulfate 325 (65 FE) MG tablet, Take 325 mg by mouth daily with breakfast., Disp: , Rfl:    fexofenadine  (ALLERGY  RELIEF) 180 MG tablet, Take 1 tablet (180 mg total) by mouth daily., Disp: 90 tablet, Rfl: 3   fluticasone  (FLONASE ) 50 MCG/ACT nasal spray, Place 1 spray into both nostrils daily., Disp: 16 g, Rfl: 11   ibuprofen  (ADVIL ) 800 MG tablet, Take 800 mg by mouth every 6 (six) hours as needed., Disp: , Rfl:    Ketoprofen (FROTEK) 10 % CREA, Apply 1 application  topically 4 (four) times daily., Disp: , Rfl:    Lancets (UNILET COMFORTOUCH LANCET) MISC, check blood sugar 3 TIMES DAILY AS DIRECTED, Disp: 200 each, Rfl: 1   levocetirizine (XYZAL ) 5 MG tablet, Take 1 tablet (5 mg total) by mouth every evening., Disp: 30 tablet, Rfl: 11   Magnesium  Glycinate 100 MG CAPS, TAKE 1 CAPSULE BY MOUTH EVERY EVENING, Disp: 30 capsule, Rfl: 3   Multiple Vitamin (MULTIVITAMIN WITH MINERALS) TABS tablet, Take 1 tablet by mouth daily., Disp: , Rfl:    mupirocin  ointment (BACTROBAN ) 2 %, Apply 1 Application topically 2 (two) times daily., Disp: 22 g, Rfl: 0   omeprazole  (PRILOSEC) 40 MG capsule, TAKE 1 CAPSULE BY MOUTH 2 TIMES DAILY, Disp: 180 capsule, Rfl: 3   ondansetron  (ZOFRAN ) 4 MG tablet, Take 1 tablet (4 mg total) by mouth every 4 (four) hours as needed for nausea or vomiting., Disp: 6 tablet, Rfl: 0   oxyCODONE -acetaminophen  (PERCOCET) 5-325 MG tablet, Take 1 tablet by mouth every 4 (four)  hours as needed for severe pain (pain score 7-10)., Disp: 20 tablet, Rfl: 0   progesterone  (PROMETRIUM ) 100 MG capsule, Take 200 mg by mouth at bedtime., Disp: , Rfl:    progesterone  (PROMETRIUM ) 100 MG capsule, Take 2 capsules by mouth daily., Disp: , Rfl:    spironolactone  (ALDACTONE ) 50 MG tablet, Take 1 tablet (50 mg total) by mouth daily., Disp: 90 tablet, Rfl: 1   tamsulosin (FLOMAX) 0.4 MG CAPS capsule, Take 0.4 mg by mouth daily., Disp: , Rfl:    tirzepatide  (MOUNJARO ) 15 MG/0.5ML Pen, inject 15  MG into THE SKIN ONCE WEEKLY, Disp: 6 mL, Rfl: 3   topiramate  (TOPAMAX ) 50 MG tablet, Take 2 tablets (100 mg total) by mouth 2 (two) times daily., Disp: 120 tablet, Rfl: 5   traMADol  (ULTRAM ) 50 MG tablet, Take 1-2 tablets (50-100 mg total) by mouth every 6 (six) hours as needed for moderate pain., Disp: 15 tablet, Rfl: 0   Ubrogepant  (UBRELVY ) 100 MG TABS, Take 1 tablet (100 mg total) by mouth as needed. Not for daily use., Disp: 10 tablet, Rfl: 3   valsartan  (DIOVAN ) 320 MG tablet, TAKE 1 TABLET BY MOUTH EVERY DAY, Disp: 90 tablet, Rfl: 1   Vitamin D , Ergocalciferol , (DRISDOL ) 1.25 MG (50000 UNIT) CAPS capsule, TAKE 1 CAPSULE BY MOUTH EVERY 7 DAYS, Disp: 12 capsule, Rfl: 1  "

## 2024-08-09 NOTE — Patient Instructions (Signed)
 " Emily Phelps, thank you for joining Leita FORBES Molly, PA-C for today's virtual visit.  While this provider is not your primary care provider (PCP), if your PCP is located in our provider database this encounter information will be shared with them immediately following your visit.   A [facility] MyChart account gives you access to today's visit and all your visits, tests, and labs performed at [facility]  click here if you don't have a [facility] MyChart account or go to [email or web address]  Consent: (Patient) Emily Phelps provided verbal consent for this virtual visit at the beginning of the encounter.  Current Medications:  Current Outpatient Medications:    albuterol  (VENTOLIN  HFA) 108 (90 Base) MCG/ACT inhaler, Inhale 2 puffs into the lungs every 6 (six) hours as needed for wheezing or shortness of breath., Disp: 6.7 g, Rfl: 0   amLODipine  (NORVASC ) 10 MG tablet, TAKE 1 TABLET BY MOUTH EVERY DAY, Disp: 90 tablet, Rfl: 1   atenolol  (TENORMIN ) 100 MG tablet, TAKE 1 TABLET BY MOUTH EVERY DAY, Disp: 90 tablet, Rfl: 3   atorvastatin  (LIPITOR) 10 MG tablet, TAKE 1 TABLET BY MOUTH EVERY DAY, Disp: 30 tablet, Rfl: 2   azelastine  (ASTELIN ) 0.1 % nasal spray, Place 2 sprays into both nostrils 2 (two) times daily as needed for rhinitis. Use in each nostril as directed, Disp: 30 mL, Rfl: 11   buPROPion  (WELLBUTRIN  XL) 150 MG 24 hr tablet, TAKE 1 TABLET BY MOUTH EVERY MORNING, Disp: 30 tablet, Rfl: 2   Continuous Blood Gluc Receiver (DEXCOM G6 RECEIVER) DEVI, Use to check blood sugars dx code e11.65, Disp: 3 each, Rfl: 3   Continuous Glucose Sensor (DEXCOM G7 SENSOR) MISC, USE AS DIRECTED TO monitor blood sugar, Disp: 4 each, Rfl: 3   Continuous Glucose Transmitter (DEXCOM G6 TRANSMITTER) MISC, CHECK BLOOD SUGAR BEFORE   MEALS AND AT BEDTIME, Disp: 1 each, Rfl: PRN   cyclobenzaprine  (FLEXERIL ) 10 MG tablet, Take 1 tablet (10 mg total) by mouth 3 (three) times daily as needed for  muscle spasms., Disp: 30 tablet, Rfl: 0   dicyclomine  (BENTYL ) 10 MG capsule, TAKE 1 CAPSULE BY MOUTH 4 TIMES DAILY AS NEEDED FOR SPASMS, Disp: 30 capsule, Rfl: 2   doxycycline  (VIBRAMYCIN ) 50 MG capsule, Take 50 mg by mouth 2 (two) times daily., Disp: , Rfl:    EYSUVIS 0.25 % SUSP, , Disp: , Rfl:    FARXIGA  5 MG TABS tablet, TAKE 1 TABLET BY MOUTH EVERY DAY, Disp: 90 tablet, Rfl: 2   ferrous sulfate 325 (65 FE) MG tablet, Take 325 mg by mouth daily with breakfast., Disp: , Rfl:    fexofenadine  (ALLERGY  RELIEF) 180 MG tablet, Take 1 tablet (180 mg total) by mouth daily., Disp: 90 tablet, Rfl: 3   fluticasone  (FLONASE ) 50 MCG/ACT nasal spray, Place 1 spray into both nostrils daily., Disp: 16 g, Rfl: 11   ibuprofen  (ADVIL ) 800 MG tablet, Take 800 mg by mouth every 6 (six) hours as needed., Disp: , Rfl:    Ketoprofen (FROTEK) 10 % CREA, Apply 1 application  topically 4 (four) times daily., Disp: , Rfl:    Lancets (UNILET COMFORTOUCH LANCET) MISC, check blood sugar 3 TIMES DAILY AS DIRECTED, Disp: 200 each, Rfl: 1   levocetirizine (XYZAL ) 5 MG tablet, Take 1 tablet (5 mg total) by mouth every evening., Disp: 30 tablet, Rfl: 11   Magnesium  Glycinate 100 MG CAPS, TAKE 1 CAPSULE BY MOUTH EVERY EVENING, Disp: 30 capsule, Rfl: 3   Multiple Vitamin (  MULTIVITAMIN WITH MINERALS) TABS tablet, Take 1 tablet by mouth daily., Disp: , Rfl:    mupirocin  ointment (BACTROBAN ) 2 %, Apply 1 Application topically 2 (two) times daily., Disp: 22 g, Rfl: 0   omeprazole  (PRILOSEC) 40 MG capsule, TAKE 1 CAPSULE BY MOUTH 2 TIMES DAILY, Disp: 180 capsule, Rfl: 3   ondansetron  (ZOFRAN ) 4 MG tablet, Take 1 tablet (4 mg total) by mouth every 4 (four) hours as needed for nausea or vomiting., Disp: 6 tablet, Rfl: 0   oxyCODONE -acetaminophen  (PERCOCET) 5-325 MG tablet, Take 1 tablet by mouth every 4 (four) hours as needed for severe pain (pain score 7-10)., Disp: 20 tablet, Rfl: 0   progesterone  (PROMETRIUM ) 100 MG capsule, Take 200  mg by mouth at bedtime., Disp: , Rfl:    progesterone  (PROMETRIUM ) 100 MG capsule, Take 2 capsules by mouth daily., Disp: , Rfl:    spironolactone  (ALDACTONE ) 50 MG tablet, Take 1 tablet (50 mg total) by mouth daily., Disp: 90 tablet, Rfl: 1   tamsulosin (FLOMAX) 0.4 MG CAPS capsule, Take 0.4 mg by mouth daily., Disp: , Rfl:    tirzepatide  (MOUNJARO ) 15 MG/0.5ML Pen, inject 15 MG into THE SKIN ONCE WEEKLY, Disp: 6 mL, Rfl: 3   topiramate  (TOPAMAX ) 50 MG tablet, Take 2 tablets (100 mg total) by mouth 2 (two) times daily., Disp: 120 tablet, Rfl: 5   traMADol  (ULTRAM ) 50 MG tablet, Take 1-2 tablets (50-100 mg total) by mouth every 6 (six) hours as needed for moderate pain., Disp: 15 tablet, Rfl: 0   Ubrogepant  (UBRELVY ) 100 MG TABS, Take 1 tablet (100 mg total) by mouth as needed. Not for daily use., Disp: 10 tablet, Rfl: 3   valsartan  (DIOVAN ) 320 MG tablet, TAKE 1 TABLET BY MOUTH EVERY DAY, Disp: 90 tablet, Rfl: 1   Vitamin D , Ergocalciferol , (DRISDOL ) 1.25 MG (50000 UNIT) CAPS capsule, TAKE 1 CAPSULE BY MOUTH EVERY 7 DAYS, Disp: 12 capsule, Rfl: 1   Medications ordered in this encounter:  No orders of the defined types were placed in this encounter.    *If you need refills on other medications prior to your next appointment, please contact your pharmacy*  Follow-Up: Call back or seek an in-person evaluation if the symptoms worsen or if the condition fails to improve as anticipated.  [facility] [phone]  Other Instructions We are managing your symptoms with an antibiotic, to treat for sinus infection; and with medications to help with cough. -Start the antibiotic- Augmentin  (amoxicillin -clavulanate), 1 pill every 12 hours for 7 days.  You can take this with food like with breakfast and dinner. -Promethazine  DM cough syrup for congestion/cough. This could make you drowsy, so take at night before bed. -Albuterol  inhaler as needed for cough, wheezing, shortness of breath, 1  to 2 puffs every 6 hours as needed.   If you have been instructed to have an in-person evaluation today at a local Urgent Care facility, please use the link below. It will take you to a list of all of our available [facility], including address, phone number and hours of operation. Please do not delay care.  [facility]  If you or a family member do not have a primary care provider, use the link below to schedule a visit and establish care. When you choose a  primary care physician or advanced practice provider, you gain a long-term partner in health. Find a Primary Care Provider  Learn more about 's in-office and  virtual care options: [facility] Get Care Now  "

## 2024-08-15 ENCOUNTER — Other Ambulatory Visit: Payer: Self-pay | Admitting: Nurse Practitioner

## 2024-08-15 DIAGNOSIS — F4321 Adjustment disorder with depressed mood: Secondary | ICD-10-CM

## 2024-08-15 DIAGNOSIS — F329 Major depressive disorder, single episode, unspecified: Secondary | ICD-10-CM

## 2024-10-04 ENCOUNTER — Encounter: Admitting: Nurse Practitioner

## 2024-10-25 ENCOUNTER — Ambulatory Visit: Admitting: Adult Health

## 2024-11-15 ENCOUNTER — Ambulatory Visit (HOSPITAL_BASED_OUTPATIENT_CLINIC_OR_DEPARTMENT_OTHER): Admitting: Cardiovascular Disease

## 2025-05-02 ENCOUNTER — Ambulatory Visit: Admitting: Internal Medicine
